# Patient Record
Sex: Female | Born: 1935 | Race: White | Hispanic: No | Smoking: Former smoker
Health system: Southern US, Community
[De-identification: ages and names within clinical notes are randomized; demographics above are authoritative.]

## PROBLEM LIST (undated history)

## (undated) DIAGNOSIS — I712 Thoracic aortic aneurysm, without rupture, unspecified: Secondary | ICD-10-CM

## (undated) DIAGNOSIS — R0989 Other specified symptoms and signs involving the circulatory and respiratory systems: Secondary | ICD-10-CM

## (undated) DIAGNOSIS — M5030 Other cervical disc degeneration, unspecified cervical region: Secondary | ICD-10-CM

## (undated) DIAGNOSIS — G629 Polyneuropathy, unspecified: Secondary | ICD-10-CM

## (undated) DIAGNOSIS — F32A Depression, unspecified: Secondary | ICD-10-CM

## (undated) DIAGNOSIS — I517 Cardiomegaly: Secondary | ICD-10-CM

## (undated) DIAGNOSIS — I480 Paroxysmal atrial fibrillation: Secondary | ICD-10-CM

## (undated) DIAGNOSIS — Z7901 Long term (current) use of anticoagulants: Secondary | ICD-10-CM

## (undated) DIAGNOSIS — F419 Anxiety disorder, unspecified: Secondary | ICD-10-CM

## (undated) DIAGNOSIS — E785 Hyperlipidemia, unspecified: Secondary | ICD-10-CM

## (undated) DIAGNOSIS — M1990 Unspecified osteoarthritis, unspecified site: Secondary | ICD-10-CM

## (undated) DIAGNOSIS — I700 Atherosclerosis of aorta: Secondary | ICD-10-CM

## (undated) DIAGNOSIS — R296 Repeated falls: Secondary | ICD-10-CM

## (undated) DIAGNOSIS — I2510 Atherosclerotic heart disease of native coronary artery without angina pectoris: Secondary | ICD-10-CM

## (undated) DIAGNOSIS — I5030 Unspecified diastolic (congestive) heart failure: Secondary | ICD-10-CM

## (undated) DIAGNOSIS — R0600 Dyspnea, unspecified: Secondary | ICD-10-CM

## (undated) DIAGNOSIS — D649 Anemia, unspecified: Secondary | ICD-10-CM

## (undated) DIAGNOSIS — C189 Malignant neoplasm of colon, unspecified: Secondary | ICD-10-CM

## (undated) DIAGNOSIS — I6789 Other cerebrovascular disease: Secondary | ICD-10-CM

## (undated) DIAGNOSIS — K219 Gastro-esophageal reflux disease without esophagitis: Secondary | ICD-10-CM

## (undated) DIAGNOSIS — I350 Nonrheumatic aortic (valve) stenosis: Secondary | ICD-10-CM

## (undated) DIAGNOSIS — I779 Disorder of arteries and arterioles, unspecified: Secondary | ICD-10-CM

## (undated) DIAGNOSIS — S32009A Unspecified fracture of unspecified lumbar vertebra, initial encounter for closed fracture: Secondary | ICD-10-CM

## (undated) DIAGNOSIS — K5790 Diverticulosis of intestine, part unspecified, without perforation or abscess without bleeding: Secondary | ICD-10-CM

## (undated) DIAGNOSIS — K4090 Unilateral inguinal hernia, without obstruction or gangrene, not specified as recurrent: Secondary | ICD-10-CM

## (undated) DIAGNOSIS — C801 Malignant (primary) neoplasm, unspecified: Secondary | ICD-10-CM

## (undated) DIAGNOSIS — I272 Pulmonary hypertension, unspecified: Secondary | ICD-10-CM

## (undated) DIAGNOSIS — I10 Essential (primary) hypertension: Secondary | ICD-10-CM

## (undated) DIAGNOSIS — I4891 Unspecified atrial fibrillation: Secondary | ICD-10-CM

## (undated) DIAGNOSIS — I209 Angina pectoris, unspecified: Secondary | ICD-10-CM

## (undated) HISTORY — PX: CATARACT EXTRACTION: SUR2

## (undated) HISTORY — PX: FEMUR FRACTURE SURGERY: SHX633

## (undated) HISTORY — PX: COLON SURGERY: SHX602

## (undated) HISTORY — PX: HIP SURGERY: SHX245

## (undated) HISTORY — PX: FOREARM SURGERY: SHX651

---

## 2004-03-12 ENCOUNTER — Emergency Department: Payer: Self-pay | Admitting: Emergency Medicine

## 2004-04-04 ENCOUNTER — Ambulatory Visit: Payer: Self-pay | Admitting: Internal Medicine

## 2005-05-19 ENCOUNTER — Ambulatory Visit: Payer: Self-pay | Admitting: Gastroenterology

## 2005-06-02 ENCOUNTER — Other Ambulatory Visit: Payer: Self-pay

## 2005-06-02 ENCOUNTER — Inpatient Hospital Stay: Payer: Self-pay | Admitting: General Surgery

## 2005-06-23 ENCOUNTER — Ambulatory Visit: Payer: Self-pay | Admitting: Oncology

## 2005-06-29 ENCOUNTER — Ambulatory Visit: Payer: Self-pay | Admitting: General Surgery

## 2005-06-30 ENCOUNTER — Ambulatory Visit: Payer: Self-pay | Admitting: General Surgery

## 2005-07-16 ENCOUNTER — Ambulatory Visit: Payer: Self-pay | Admitting: Oncology

## 2005-08-15 ENCOUNTER — Ambulatory Visit: Payer: Self-pay | Admitting: Oncology

## 2005-09-15 ENCOUNTER — Ambulatory Visit: Payer: Self-pay | Admitting: Oncology

## 2005-10-15 ENCOUNTER — Ambulatory Visit: Payer: Self-pay | Admitting: Oncology

## 2005-11-15 ENCOUNTER — Ambulatory Visit: Payer: Self-pay | Admitting: Oncology

## 2005-12-16 ENCOUNTER — Ambulatory Visit: Payer: Self-pay | Admitting: Oncology

## 2005-12-30 ENCOUNTER — Emergency Department: Payer: Self-pay | Admitting: General Practice

## 2006-01-15 ENCOUNTER — Ambulatory Visit: Payer: Self-pay | Admitting: Oncology

## 2006-02-15 ENCOUNTER — Ambulatory Visit: Payer: Self-pay | Admitting: Oncology

## 2006-03-05 ENCOUNTER — Emergency Department: Payer: Self-pay | Admitting: Unknown Physician Specialty

## 2006-03-12 ENCOUNTER — Emergency Department: Payer: Self-pay | Admitting: Emergency Medicine

## 2006-03-17 ENCOUNTER — Ambulatory Visit: Payer: Self-pay | Admitting: Oncology

## 2006-04-17 ENCOUNTER — Ambulatory Visit: Payer: Self-pay | Admitting: Oncology

## 2006-05-15 ENCOUNTER — Ambulatory Visit: Payer: Self-pay | Admitting: Gastroenterology

## 2006-05-18 ENCOUNTER — Ambulatory Visit: Payer: Self-pay | Admitting: Oncology

## 2006-06-16 ENCOUNTER — Ambulatory Visit: Payer: Self-pay | Admitting: Oncology

## 2006-07-17 ENCOUNTER — Ambulatory Visit: Payer: Self-pay | Admitting: Oncology

## 2006-08-16 ENCOUNTER — Ambulatory Visit: Payer: Self-pay | Admitting: Oncology

## 2006-08-27 ENCOUNTER — Ambulatory Visit: Payer: Self-pay | Admitting: Oncology

## 2006-09-16 ENCOUNTER — Ambulatory Visit: Payer: Self-pay | Admitting: Oncology

## 2006-10-16 ENCOUNTER — Ambulatory Visit: Payer: Self-pay | Admitting: Oncology

## 2006-11-16 ENCOUNTER — Ambulatory Visit: Payer: Self-pay | Admitting: Oncology

## 2006-12-17 ENCOUNTER — Ambulatory Visit: Payer: Self-pay | Admitting: Oncology

## 2007-01-30 ENCOUNTER — Ambulatory Visit: Payer: Self-pay | Admitting: Internal Medicine

## 2007-02-16 ENCOUNTER — Ambulatory Visit: Payer: Self-pay | Admitting: Oncology

## 2007-02-20 ENCOUNTER — Ambulatory Visit: Payer: Self-pay | Admitting: Oncology

## 2007-03-18 ENCOUNTER — Ambulatory Visit: Payer: Self-pay | Admitting: Oncology

## 2007-05-19 ENCOUNTER — Ambulatory Visit: Payer: Self-pay | Admitting: Oncology

## 2007-05-23 ENCOUNTER — Ambulatory Visit: Payer: Self-pay | Admitting: Oncology

## 2007-06-16 ENCOUNTER — Ambulatory Visit: Payer: Self-pay | Admitting: Oncology

## 2007-08-16 ENCOUNTER — Ambulatory Visit: Payer: Self-pay | Admitting: Oncology

## 2007-08-21 ENCOUNTER — Ambulatory Visit: Payer: Self-pay | Admitting: Oncology

## 2007-09-16 ENCOUNTER — Ambulatory Visit: Payer: Self-pay | Admitting: Oncology

## 2007-09-20 ENCOUNTER — Inpatient Hospital Stay: Payer: Self-pay | Admitting: Internal Medicine

## 2007-11-16 ENCOUNTER — Ambulatory Visit: Payer: Self-pay | Admitting: Oncology

## 2007-11-27 ENCOUNTER — Ambulatory Visit: Payer: Self-pay | Admitting: Oncology

## 2007-12-17 ENCOUNTER — Ambulatory Visit: Payer: Self-pay | Admitting: Oncology

## 2008-03-03 ENCOUNTER — Ambulatory Visit: Payer: Self-pay | Admitting: Internal Medicine

## 2008-05-18 ENCOUNTER — Ambulatory Visit: Payer: Self-pay | Admitting: Oncology

## 2008-05-27 ENCOUNTER — Ambulatory Visit: Payer: Self-pay | Admitting: Oncology

## 2008-06-15 ENCOUNTER — Ambulatory Visit: Payer: Self-pay | Admitting: Oncology

## 2008-07-16 ENCOUNTER — Inpatient Hospital Stay: Payer: Self-pay | Admitting: Specialist

## 2008-11-15 ENCOUNTER — Ambulatory Visit: Payer: Self-pay | Admitting: Oncology

## 2008-11-30 ENCOUNTER — Ambulatory Visit: Payer: Self-pay | Admitting: Oncology

## 2008-12-16 ENCOUNTER — Ambulatory Visit: Payer: Self-pay | Admitting: Oncology

## 2009-03-16 ENCOUNTER — Ambulatory Visit: Payer: Self-pay | Admitting: Internal Medicine

## 2009-05-18 ENCOUNTER — Ambulatory Visit: Payer: Self-pay | Admitting: Oncology

## 2009-06-08 ENCOUNTER — Ambulatory Visit: Payer: Self-pay | Admitting: Oncology

## 2009-06-15 ENCOUNTER — Ambulatory Visit: Payer: Self-pay | Admitting: Oncology

## 2009-06-22 ENCOUNTER — Ambulatory Visit: Payer: Self-pay | Admitting: Gastroenterology

## 2009-11-15 ENCOUNTER — Ambulatory Visit: Payer: Self-pay | Admitting: Oncology

## 2009-11-18 ENCOUNTER — Ambulatory Visit: Payer: Self-pay | Admitting: Oncology

## 2009-11-23 ENCOUNTER — Ambulatory Visit: Payer: Self-pay | Admitting: Oncology

## 2009-12-04 ENCOUNTER — Ambulatory Visit: Payer: Self-pay | Admitting: Internal Medicine

## 2009-12-16 ENCOUNTER — Ambulatory Visit: Payer: Self-pay | Admitting: Oncology

## 2010-03-23 ENCOUNTER — Ambulatory Visit: Payer: Self-pay | Admitting: Internal Medicine

## 2010-05-16 ENCOUNTER — Ambulatory Visit: Payer: Self-pay | Admitting: Gastroenterology

## 2010-06-01 ENCOUNTER — Ambulatory Visit: Payer: Self-pay | Admitting: Oncology

## 2010-06-02 LAB — CEA: CEA: 1.3 ng/mL (ref 0.0–4.7)

## 2010-06-16 ENCOUNTER — Ambulatory Visit: Payer: Self-pay | Admitting: Oncology

## 2010-11-28 ENCOUNTER — Ambulatory Visit: Payer: Self-pay | Admitting: Oncology

## 2010-11-30 ENCOUNTER — Ambulatory Visit: Payer: Self-pay | Admitting: Oncology

## 2010-12-01 LAB — CEA: CEA: 1.7 ng/mL (ref 0.0–4.7)

## 2010-12-06 ENCOUNTER — Ambulatory Visit: Payer: Self-pay | Admitting: Specialist

## 2010-12-08 ENCOUNTER — Ambulatory Visit: Payer: Self-pay | Admitting: Specialist

## 2010-12-17 ENCOUNTER — Ambulatory Visit: Payer: Self-pay | Admitting: Oncology

## 2011-05-17 ENCOUNTER — Ambulatory Visit: Payer: Self-pay | Admitting: Internal Medicine

## 2011-09-01 ENCOUNTER — Ambulatory Visit: Payer: Self-pay | Admitting: Internal Medicine

## 2012-05-17 ENCOUNTER — Ambulatory Visit: Payer: Self-pay | Admitting: Internal Medicine

## 2012-08-05 ENCOUNTER — Ambulatory Visit: Payer: Self-pay | Admitting: Specialist

## 2012-08-15 ENCOUNTER — Ambulatory Visit: Payer: Self-pay | Admitting: Specialist

## 2013-02-24 ENCOUNTER — Ambulatory Visit: Payer: Self-pay | Admitting: Specialist

## 2013-03-05 ENCOUNTER — Ambulatory Visit: Payer: Self-pay | Admitting: Specialist

## 2013-05-19 ENCOUNTER — Ambulatory Visit: Payer: Self-pay | Admitting: Internal Medicine

## 2014-01-23 ENCOUNTER — Ambulatory Visit: Payer: Self-pay | Admitting: Internal Medicine

## 2014-01-26 ENCOUNTER — Ambulatory Visit: Payer: Self-pay | Admitting: Orthopedic Surgery

## 2014-01-28 ENCOUNTER — Ambulatory Visit: Payer: Self-pay | Admitting: Orthopedic Surgery

## 2014-01-28 LAB — CBC WITH DIFFERENTIAL/PLATELET
Basophil #: 0 10*3/uL (ref 0.0–0.1)
Basophil %: 0.4 %
EOS ABS: 0.2 10*3/uL (ref 0.0–0.7)
Eosinophil %: 3.1 %
HCT: 39.4 % (ref 35.0–47.0)
HGB: 12.5 g/dL (ref 12.0–16.0)
Lymphocyte #: 2 10*3/uL (ref 1.0–3.6)
Lymphocyte %: 26.9 %
MCH: 29.1 pg (ref 26.0–34.0)
MCHC: 31.8 g/dL — AB (ref 32.0–36.0)
MCV: 92 fL (ref 80–100)
Monocyte #: 0.5 x10 3/mm (ref 0.2–0.9)
Monocyte %: 7.4 %
NEUTROS ABS: 4.5 10*3/uL (ref 1.4–6.5)
NEUTROS PCT: 62.2 %
PLATELETS: 228 10*3/uL (ref 150–440)
RBC: 4.31 10*6/uL (ref 3.80–5.20)
RDW: 12.7 % (ref 11.5–14.5)
WBC: 7.3 10*3/uL (ref 3.6–11.0)

## 2014-01-29 ENCOUNTER — Ambulatory Visit: Payer: Self-pay | Admitting: Orthopedic Surgery

## 2014-01-31 LAB — PATHOLOGY REPORT

## 2014-05-19 DIAGNOSIS — H4011X1 Primary open-angle glaucoma, mild stage: Secondary | ICD-10-CM | POA: Diagnosis not present

## 2014-05-21 DIAGNOSIS — H4011X1 Primary open-angle glaucoma, mild stage: Secondary | ICD-10-CM | POA: Diagnosis not present

## 2014-06-02 ENCOUNTER — Ambulatory Visit: Payer: Self-pay | Admitting: Internal Medicine

## 2014-06-02 DIAGNOSIS — H538 Other visual disturbances: Secondary | ICD-10-CM | POA: Diagnosis not present

## 2014-06-02 DIAGNOSIS — I6529 Occlusion and stenosis of unspecified carotid artery: Secondary | ICD-10-CM | POA: Diagnosis not present

## 2014-06-02 DIAGNOSIS — I6523 Occlusion and stenosis of bilateral carotid arteries: Secondary | ICD-10-CM | POA: Diagnosis not present

## 2014-06-02 DIAGNOSIS — R110 Nausea: Secondary | ICD-10-CM | POA: Diagnosis not present

## 2014-06-02 DIAGNOSIS — R51 Headache: Secondary | ICD-10-CM | POA: Diagnosis not present

## 2014-06-02 DIAGNOSIS — G451 Carotid artery syndrome (hemispheric): Secondary | ICD-10-CM | POA: Diagnosis not present

## 2014-06-02 DIAGNOSIS — G459 Transient cerebral ischemic attack, unspecified: Secondary | ICD-10-CM | POA: Diagnosis not present

## 2014-06-02 DIAGNOSIS — I6521 Occlusion and stenosis of right carotid artery: Secondary | ICD-10-CM | POA: Diagnosis not present

## 2014-06-02 DIAGNOSIS — E782 Mixed hyperlipidemia: Secondary | ICD-10-CM | POA: Diagnosis not present

## 2014-06-02 DIAGNOSIS — R410 Disorientation, unspecified: Secondary | ICD-10-CM | POA: Diagnosis not present

## 2014-06-11 DIAGNOSIS — I6521 Occlusion and stenosis of right carotid artery: Secondary | ICD-10-CM | POA: Diagnosis not present

## 2014-06-16 DIAGNOSIS — Z0000 Encounter for general adult medical examination without abnormal findings: Secondary | ICD-10-CM | POA: Diagnosis not present

## 2014-06-16 DIAGNOSIS — E782 Mixed hyperlipidemia: Secondary | ICD-10-CM | POA: Diagnosis not present

## 2014-06-16 DIAGNOSIS — G620 Drug-induced polyneuropathy: Secondary | ICD-10-CM | POA: Diagnosis not present

## 2014-06-16 DIAGNOSIS — M4856XA Collapsed vertebra, not elsewhere classified, lumbar region, initial encounter for fracture: Secondary | ICD-10-CM | POA: Diagnosis not present

## 2014-07-26 ENCOUNTER — Inpatient Hospital Stay
Admit: 2014-07-26 | Disposition: A | Discharge status: Skilled Nursing Facility | Payer: Self-pay | Attending: Internal Medicine | Admitting: Internal Medicine

## 2014-07-26 DIAGNOSIS — Z85038 Personal history of other malignant neoplasm of large intestine: Secondary | ICD-10-CM | POA: Diagnosis not present

## 2014-07-26 DIAGNOSIS — S72141A Displaced intertrochanteric fracture of right femur, initial encounter for closed fracture: Secondary | ICD-10-CM | POA: Diagnosis not present

## 2014-07-26 DIAGNOSIS — M25551 Pain in right hip: Secondary | ICD-10-CM | POA: Diagnosis not present

## 2014-07-26 DIAGNOSIS — M810 Age-related osteoporosis without current pathological fracture: Secondary | ICD-10-CM | POA: Diagnosis not present

## 2014-07-26 DIAGNOSIS — D6959 Other secondary thrombocytopenia: Secondary | ICD-10-CM | POA: Diagnosis not present

## 2014-07-26 DIAGNOSIS — R1310 Dysphagia, unspecified: Secondary | ICD-10-CM | POA: Diagnosis not present

## 2014-07-26 DIAGNOSIS — K449 Diaphragmatic hernia without obstruction or gangrene: Secondary | ICD-10-CM | POA: Diagnosis not present

## 2014-07-26 DIAGNOSIS — D649 Anemia, unspecified: Secondary | ICD-10-CM | POA: Diagnosis not present

## 2014-07-26 DIAGNOSIS — S299XXA Unspecified injury of thorax, initial encounter: Secondary | ICD-10-CM | POA: Diagnosis not present

## 2014-07-26 DIAGNOSIS — E871 Hypo-osmolality and hyponatremia: Secondary | ICD-10-CM | POA: Diagnosis not present

## 2014-07-26 DIAGNOSIS — K5900 Constipation, unspecified: Secondary | ICD-10-CM | POA: Diagnosis not present

## 2014-07-26 DIAGNOSIS — E789 Disorder of lipoprotein metabolism, unspecified: Secondary | ICD-10-CM | POA: Diagnosis not present

## 2014-07-26 DIAGNOSIS — I350 Nonrheumatic aortic (valve) stenosis: Secondary | ICD-10-CM | POA: Diagnosis not present

## 2014-07-26 DIAGNOSIS — M79671 Pain in right foot: Secondary | ICD-10-CM | POA: Diagnosis not present

## 2014-07-26 DIAGNOSIS — W19XXXA Unspecified fall, initial encounter: Secondary | ICD-10-CM | POA: Diagnosis not present

## 2014-07-26 DIAGNOSIS — M25559 Pain in unspecified hip: Secondary | ICD-10-CM | POA: Diagnosis not present

## 2014-07-26 DIAGNOSIS — G629 Polyneuropathy, unspecified: Secondary | ICD-10-CM | POA: Diagnosis not present

## 2014-07-26 DIAGNOSIS — D7582 Heparin induced thrombocytopenia (HIT): Secondary | ICD-10-CM | POA: Diagnosis not present

## 2014-07-26 DIAGNOSIS — M79604 Pain in right leg: Secondary | ICD-10-CM | POA: Diagnosis not present

## 2014-07-26 DIAGNOSIS — S72144D Nondisplaced intertrochanteric fracture of right femur, subsequent encounter for closed fracture with routine healing: Secondary | ICD-10-CM | POA: Diagnosis not present

## 2014-07-26 DIAGNOSIS — E870 Hyperosmolality and hypernatremia: Secondary | ICD-10-CM | POA: Diagnosis not present

## 2014-07-26 DIAGNOSIS — S72144A Nondisplaced intertrochanteric fracture of right femur, initial encounter for closed fracture: Secondary | ICD-10-CM | POA: Diagnosis not present

## 2014-07-26 DIAGNOSIS — D696 Thrombocytopenia, unspecified: Secondary | ICD-10-CM | POA: Diagnosis not present

## 2014-07-26 DIAGNOSIS — G500 Trigeminal neuralgia: Secondary | ICD-10-CM | POA: Diagnosis not present

## 2014-07-26 DIAGNOSIS — I209 Angina pectoris, unspecified: Secondary | ICD-10-CM | POA: Diagnosis not present

## 2014-07-26 DIAGNOSIS — S72001A Fracture of unspecified part of neck of right femur, initial encounter for closed fracture: Secondary | ICD-10-CM | POA: Diagnosis not present

## 2014-07-26 DIAGNOSIS — D62 Acute posthemorrhagic anemia: Secondary | ICD-10-CM | POA: Diagnosis not present

## 2014-07-26 DIAGNOSIS — H409 Unspecified glaucoma: Secondary | ICD-10-CM | POA: Diagnosis not present

## 2014-07-26 DIAGNOSIS — Z9181 History of falling: Secondary | ICD-10-CM | POA: Diagnosis not present

## 2014-07-26 DIAGNOSIS — S22080A Wedge compression fracture of T11-T12 vertebra, initial encounter for closed fracture: Secondary | ICD-10-CM | POA: Diagnosis not present

## 2014-07-26 DIAGNOSIS — S72141D Displaced intertrochanteric fracture of right femur, subsequent encounter for closed fracture with routine healing: Secondary | ICD-10-CM | POA: Diagnosis not present

## 2014-07-26 DIAGNOSIS — E785 Hyperlipidemia, unspecified: Secondary | ICD-10-CM | POA: Diagnosis not present

## 2014-07-26 DIAGNOSIS — Z01818 Encounter for other preprocedural examination: Secondary | ICD-10-CM | POA: Diagnosis not present

## 2014-07-26 LAB — CBC WITH DIFFERENTIAL/PLATELET
BASOS PCT: 0.2 %
Basophil #: 0 10*3/uL (ref 0.0–0.1)
EOS ABS: 0.2 10*3/uL (ref 0.0–0.7)
Eosinophil %: 2.4 %
HCT: 39.5 % (ref 35.0–47.0)
HGB: 13 g/dL (ref 12.0–16.0)
LYMPHS ABS: 1.6 10*3/uL (ref 1.0–3.6)
Lymphocyte %: 24.2 %
MCH: 30.2 pg (ref 26.0–34.0)
MCHC: 32.9 g/dL (ref 32.0–36.0)
MCV: 92 fL (ref 80–100)
MONO ABS: 0.5 x10 3/mm (ref 0.2–0.9)
Monocyte %: 7.8 %
Neutrophil #: 4.4 10*3/uL (ref 1.4–6.5)
Neutrophil %: 65.4 %
PLATELETS: 221 10*3/uL (ref 150–440)
RBC: 4.31 10*6/uL (ref 3.80–5.20)
RDW: 12.4 % (ref 11.5–14.5)
WBC: 6.8 10*3/uL (ref 3.6–11.0)

## 2014-07-26 LAB — COMPREHENSIVE METABOLIC PANEL
ALT: 13 U/L — AB
Albumin: 3.8 g/dL
Alkaline Phosphatase: 60 U/L
Anion Gap: 7 (ref 7–16)
BUN: 9 mg/dL
Bilirubin,Total: 0.6 mg/dL
CO2: 28 mmol/L
CREATININE: 0.46 mg/dL
Calcium, Total: 8.3 mg/dL — ABNORMAL LOW
Chloride: 92 mmol/L — ABNORMAL LOW
EGFR (African American): >60
EGFR (Non-African Amer.): >60
Glucose: 104 mg/dL — ABNORMAL HIGH
POTASSIUM: 4.1 mmol/L
SGOT(AST): 18 U/L
SODIUM: 127 mmol/L — AB
Total Protein: 6.4 g/dL — ABNORMAL LOW

## 2014-07-26 LAB — PROTIME-INR
INR: 0.9
PROTHROMBIN TIME: 12.7 s

## 2014-07-27 LAB — CBC WITH DIFFERENTIAL/PLATELET
BASOS PCT: 0.2 %
Basophil #: 0 10*3/uL (ref 0.0–0.1)
EOS PCT: 1.1 %
Eosinophil #: 0.1 10*3/uL (ref 0.0–0.7)
HCT: 33.6 % — ABNORMAL LOW (ref 35.0–47.0)
HGB: 10.9 g/dL — ABNORMAL LOW (ref 12.0–16.0)
LYMPHS ABS: 1.3 10*3/uL (ref 1.0–3.6)
Lymphocyte %: 14.4 %
MCH: 30.3 pg (ref 26.0–34.0)
MCHC: 32.6 g/dL (ref 32.0–36.0)
MCV: 93 fL (ref 80–100)
MONO ABS: 0.9 x10 3/mm (ref 0.2–0.9)
MONOS PCT: 9.7 %
Neutrophil #: 6.8 10*3/uL — ABNORMAL HIGH (ref 1.4–6.5)
Neutrophil %: 74.6 %
Platelet: 161 10*3/uL (ref 150–440)
RBC: 3.61 10*6/uL — AB (ref 3.80–5.20)
RDW: 12.4 % (ref 11.5–14.5)
WBC: 9.1 10*3/uL (ref 3.6–11.0)

## 2014-07-27 LAB — BASIC METABOLIC PANEL
ANION GAP: 6 — AB (ref 7–16)
BUN: 11 mg/dL
CALCIUM: 8.2 mg/dL — AB
CHLORIDE: 100 mmol/L — AB
Co2: 25 mmol/L
Creatinine: 0.41 mg/dL — ABNORMAL LOW
EGFR (Non-African Amer.): >60
Glucose: 120 mg/dL — ABNORMAL HIGH
POTASSIUM: 3.9 mmol/L
Sodium: 131 mmol/L — ABNORMAL LOW

## 2014-07-28 LAB — CBC WITH DIFFERENTIAL/PLATELET
BASOS PCT: 0.1 %
Basophil #: 0 10*3/uL (ref 0.0–0.1)
EOS ABS: 0.3 10*3/uL (ref 0.0–0.7)
EOS PCT: 3.9 %
HCT: 22.9 % — AB (ref 35.0–47.0)
HGB: 7.4 g/dL — AB (ref 12.0–16.0)
LYMPHS PCT: 17.6 %
Lymphocyte #: 1.5 10*3/uL (ref 1.0–3.6)
MCH: 29.6 pg (ref 26.0–34.0)
MCHC: 32.2 g/dL (ref 32.0–36.0)
MCV: 92 fL (ref 80–100)
Monocyte #: 1 x10 3/mm — ABNORMAL HIGH (ref 0.2–0.9)
Monocyte %: 11.6 %
NEUTROS ABS: 5.7 10*3/uL (ref 1.4–6.5)
Neutrophil %: 66.8 %
Platelet: 122 10*3/uL — ABNORMAL LOW (ref 150–440)
RBC: 2.49 10*6/uL — AB (ref 3.80–5.20)
RDW: 12.5 % (ref 11.5–14.5)
WBC: 8.5 10*3/uL (ref 3.6–11.0)

## 2014-07-28 LAB — BASIC METABOLIC PANEL
ANION GAP: 4 — AB (ref 7–16)
BUN: 13 mg/dL
CALCIUM: 7.7 mg/dL — AB
Chloride: 98 mmol/L — ABNORMAL LOW
Co2: 28 mmol/L
Creatinine: 0.65 mg/dL
EGFR (Non-African Amer.): >60
Glucose: 148 mg/dL — ABNORMAL HIGH
POTASSIUM: 3.5 mmol/L
SODIUM: 130 mmol/L — AB

## 2014-07-29 LAB — BASIC METABOLIC PANEL
ANION GAP: 4 — AB (ref 7–16)
BUN: 13 mg/dL
CALCIUM: 7.9 mg/dL — AB
CHLORIDE: 100 mmol/L — AB
Co2: 28 mmol/L
Creatinine: 0.44 mg/dL
Glucose: 134 mg/dL — ABNORMAL HIGH
Potassium: 3.5 mmol/L
Sodium: 132 mmol/L — ABNORMAL LOW

## 2014-07-29 LAB — CBC WITH DIFFERENTIAL/PLATELET
Basophil #: 0 10*3/uL (ref 0.0–0.1)
Basophil %: 0.3 %
EOS ABS: 0.2 10*3/uL (ref 0.0–0.7)
Eosinophil %: 3.1 %
HCT: 21.6 % — ABNORMAL LOW (ref 35.0–47.0)
Lymphocyte #: 1 10*3/uL (ref 1.0–3.6)
Lymphocyte %: 12.8 %
MCH: 30.1 pg (ref 26.0–34.0)
MCHC: 33.4 g/dL (ref 32.0–36.0)
MCV: 90 fL (ref 80–100)
MONO ABS: 1 x10 3/mm — AB (ref 0.2–0.9)
MONOS PCT: 11.9 %
Neutrophil #: 5.9 10*3/uL (ref 1.4–6.5)
Neutrophil %: 71.9 %
PLATELETS: 107 10*3/uL — AB (ref 150–440)
RBC: 2.4 10*6/uL — ABNORMAL LOW (ref 3.80–5.20)
RDW: 13.3 % (ref 11.5–14.5)
WBC: 8.1 10*3/uL (ref 3.6–11.0)

## 2014-07-29 LAB — HEMOGLOBIN
HGB: 7.2 g/dL — ABNORMAL LOW (ref 12.0–16.0)
HGB: 9.6 g/dL — AB (ref 12.0–16.0)

## 2014-07-30 DIAGNOSIS — R1310 Dysphagia, unspecified: Secondary | ICD-10-CM | POA: Diagnosis not present

## 2014-07-30 DIAGNOSIS — G500 Trigeminal neuralgia: Secondary | ICD-10-CM | POA: Diagnosis not present

## 2014-07-30 DIAGNOSIS — M810 Age-related osteoporosis without current pathological fracture: Secondary | ICD-10-CM | POA: Diagnosis not present

## 2014-07-30 DIAGNOSIS — S72144A Nondisplaced intertrochanteric fracture of right femur, initial encounter for closed fracture: Secondary | ICD-10-CM | POA: Diagnosis not present

## 2014-07-30 DIAGNOSIS — M25559 Pain in unspecified hip: Secondary | ICD-10-CM | POA: Diagnosis not present

## 2014-07-30 DIAGNOSIS — K449 Diaphragmatic hernia without obstruction or gangrene: Secondary | ICD-10-CM | POA: Diagnosis not present

## 2014-07-30 DIAGNOSIS — E789 Disorder of lipoprotein metabolism, unspecified: Secondary | ICD-10-CM | POA: Diagnosis not present

## 2014-07-30 DIAGNOSIS — S72141D Displaced intertrochanteric fracture of right femur, subsequent encounter for closed fracture with routine healing: Secondary | ICD-10-CM | POA: Diagnosis not present

## 2014-07-30 DIAGNOSIS — D649 Anemia, unspecified: Secondary | ICD-10-CM | POA: Diagnosis not present

## 2014-07-30 DIAGNOSIS — G629 Polyneuropathy, unspecified: Secondary | ICD-10-CM | POA: Diagnosis not present

## 2014-07-30 DIAGNOSIS — D62 Acute posthemorrhagic anemia: Secondary | ICD-10-CM | POA: Diagnosis not present

## 2014-07-30 DIAGNOSIS — D7582 Heparin induced thrombocytopenia (HIT): Secondary | ICD-10-CM | POA: Diagnosis not present

## 2014-07-30 DIAGNOSIS — Z85038 Personal history of other malignant neoplasm of large intestine: Secondary | ICD-10-CM | POA: Diagnosis not present

## 2014-07-30 DIAGNOSIS — I209 Angina pectoris, unspecified: Secondary | ICD-10-CM | POA: Diagnosis not present

## 2014-07-30 DIAGNOSIS — K5900 Constipation, unspecified: Secondary | ICD-10-CM | POA: Diagnosis not present

## 2014-07-30 DIAGNOSIS — E785 Hyperlipidemia, unspecified: Secondary | ICD-10-CM | POA: Diagnosis not present

## 2014-07-30 DIAGNOSIS — H409 Unspecified glaucoma: Secondary | ICD-10-CM | POA: Diagnosis not present

## 2014-07-30 DIAGNOSIS — Z9181 History of falling: Secondary | ICD-10-CM | POA: Diagnosis not present

## 2014-07-30 DIAGNOSIS — I350 Nonrheumatic aortic (valve) stenosis: Secondary | ICD-10-CM | POA: Diagnosis not present

## 2014-07-30 DIAGNOSIS — S72144D Nondisplaced intertrochanteric fracture of right femur, subsequent encounter for closed fracture with routine healing: Secondary | ICD-10-CM | POA: Diagnosis not present

## 2014-07-30 LAB — CBC WITH DIFFERENTIAL/PLATELET
BASOS ABS: 0 10*3/uL (ref 0.0–0.1)
Basophil %: 0.4 %
EOS PCT: 3.1 %
Eosinophil #: 0.3 10*3/uL (ref 0.0–0.7)
HCT: 26.6 % — ABNORMAL LOW (ref 35.0–47.0)
HGB: 8.9 g/dL — ABNORMAL LOW (ref 12.0–16.0)
LYMPHS ABS: 1.7 10*3/uL (ref 1.0–3.6)
Lymphocyte %: 19.9 %
MCH: 30.3 pg (ref 26.0–34.0)
MCHC: 33.4 g/dL (ref 32.0–36.0)
MCV: 91 fL (ref 80–100)
Monocyte #: 0.9 x10 3/mm (ref 0.2–0.9)
Monocyte %: 10.7 %
NEUTROS ABS: 5.7 10*3/uL (ref 1.4–6.5)
Neutrophil %: 65.9 %
PLATELETS: 124 10*3/uL — AB (ref 150–440)
RBC: 2.94 10*6/uL — ABNORMAL LOW (ref 3.80–5.20)
RDW: 13.5 % (ref 11.5–14.5)
WBC: 8.7 10*3/uL (ref 3.6–11.0)

## 2014-07-30 LAB — BASIC METABOLIC PANEL
ANION GAP: 6 — AB (ref 7–16)
BUN: 10 mg/dL
CALCIUM: 8 mg/dL — AB
CREATININE: 0.33 mg/dL — AB
Chloride: 99 mmol/L — ABNORMAL LOW
Co2: 29 mmol/L
Glucose: 115 mg/dL — ABNORMAL HIGH
POTASSIUM: 3.7 mmol/L
SODIUM: 134 mmol/L — AB

## 2014-08-07 NOTE — Op Note (Signed)
PATIENT NAME:  Caroline Aguilar, Caroline Aguilar MR#:  [id number] DATE OF BIRTH:  Nov 04, 1925  DATE OF PROCEDURE:  03/05/2013  PREOPERATIVE DIAGNOSES: 1. Severe right thumb carpometacarpal arthritis. 2.  Tenosynovitis first and second dorsal compartments of the wrist and thumb.   OPERATION:  1. Right thumb CMC arthroplasty using palmaris longus tendon graft. 2. Tenosynovectomy first and second dorsal compartments.   SURGEON: Park Breed, M.D.   ANESTHESIA: General LMA.   COMPLICATIONS: None.   DRAINS: None.   ESTIMATED BLOOD LOSS: None.   REPLACED: None.   DESCRIPTION OF PROCEDURE: The patient was brought to the Operating Room where she underwent satisfactory general LMA anesthesia in the supine position. The right arm was prepped and draped in sterile fashion. An Esmarch was applied. Three small transverse incisions were made over the course of the palmaris longus tendon and the tendon was freed up from adhesions, tagged distally and released, and then removed in its entirety with a tendon stripper. These wounds were closed with 5-0 nylon suture. Another incision was then made longitudinally starting volarly over the distal radius, crossing transversely at the base of the metacarpal and extending distally at the dorsum of the first metacarpal. Dissection was carried out bluntly through subcutaneous tissue. Small vessels and nerves were protected and retracted. The capsule over the trapezium was opened sharply and the radial artery and veins freed up from adhesions and a vessel loop drain placed around them for retraction protection. There was extensive synovitis fluid found in the first and second dorsal compartments. These were opened and released completely and tenosynovium removed. The first dorsal compartment was released in its entirety proximal to the radial styloid to prevent de Quervain's symptoms later.   The thumb metacarpal was dorsally subluxed. A portion of this was resected. The capsule  was carefully peeled off of the trapezium and metacarpal for later repair. The trapezium was then cut with an oscillating saw and removed piecemeal in its entirety. The flexor carpi radialis tendon was visible in the depths of the wound. The wound was irrigated. Fluoroscopy showed excellent removal of the trapezium bone. A 3.2 drill hole was made in the base of the metacarpal. The tendon graft was passed beneath the flexor carpi radialis tendon and brought up through the hole and the metacarpal. It was tied on itself and sutured. The tendon and muscle were then tied into a ball and sutured with  3-0 Vicryl suture to maintain shape. This was then rotated down into the space between the metacarpal and navicular. The capsule was then closed tightly with 3-0 Vicryl suture. Fluoroscopy showed excellent postoperative position of the metacarpal. The skin was closed with 5-0 nylon sutures and 0.5% Marcaine was placed in all wounds. A dry sterile compression hand dressing and thumb spica splint were applied. The tourniquet was deflated with good return of blood flow to the hand. The patient was awakened and taken to recovery in good condition. ____________________________ Park Breed, MD hem:sg D: 03/05/2013 09:38:20 ET T: 03/05/2013 10:06:45 ET JOB#: [id number]  cc: Park Breed, MD, <Dictator> Park Breed MD ELECTRONICALLY SIGNED 03/05/2013 11:06

## 2014-08-07 NOTE — Op Note (Signed)
PATIENT NAME:  Caroline Aguilar, Caroline Aguilar MR#:  [id number] DATE OF BIRTH:  May 26, 1925  DATE OF PROCEDURE:  08/15/2012  PREOPERATIVE DIAGNOSIS: Right carpal tunnel syndrome.   POSTOPERATIVE DIAGNOSIS: Right carpal tunnel syndrome.   OPERATION: Right carpal tunnel release.   SURGEON: Park Breed, M.D.   ANESTHESIA: General LMA.   COMPLICATIONS: None.   DRAINS: None.   ESTIMATED BLOOD LOSS: None.   REPLACEMENTS: None.   DESCRIPTION OF PROCEDURE: The patient was brought to the operating room where she underwent satisfactory LMA anesthesia in the supine position. The right arm was prepped and draped in sterile fashion. Esmarch was applied and the tourniquet inflated to 250 mmHg. Tourniquet time was 18 minutes. A longitudinal incision was made in the base of the palm just to the ulnar side of midline. Dissection was carried out bluntly through subcutaneous tissue exposing the distal aspect of the volar carpal ligament. A Kelly clamp was passed beneath the ligament to protect the nerve and the volar carpal ligament was released with a mini blade knife distally and with carpal tunnel scissors proximally under direct vision. The median nerve was pale and flattened. It branched early. Motor branch came off more volar than usual and was seen to be intact. The ulnar nerve and artery were freed up in Guyon's  canal as well. Adhesions were released using a mosquito clamp. The wound was then irrigated and closed with running 5-0 nylon suture. Marcaine 0.5% was placed in the wound and a dry sterile compression hand dressing applied with volar splint applied. Tourniquet was deflated with good return of blood flow to the hand. The patient was awakened and taken to recovery in good condition.  ____________________________ Park Breed, MD hem:aw D: 08/15/2012 08:44:21 ET T: 08/15/2012 09:25:53 ET JOB#: [id number]  cc: Park Breed, MD, <Dictator> Park Breed MD ELECTRONICALLY SIGNED 08/15/2012 9:47

## 2014-08-08 NOTE — Op Note (Signed)
PATIENT NAME:  Caroline Aguilar, Caroline Aguilar MR#:  [id number] DATE OF BIRTH:  1925-09-16  DATE OF PROCEDURE:  01/29/2014  PREOPERATIVE DIAGNOSIS: Acute L2 compression fracture.  POSTOPERATIVE DIAGNOSIS: Acute L2 compression fracture.   PROCEDURE: L2 kyphoplasty with biopsy.   SURGEON: Hessie Knows, M.D.   ANESTHESIA: MAC.   DESCRIPTION OF PROCEDURE: The patient was brought to the operating room and after adequate sedation was given, the patient was placed prone. C-arm was brought in and excellent visualization of the L2 vertebral body was obtained in both AP and lateral projections. After appropriate patient identification and timeout procedures were completed, 10 mL of 1% Xylocaine was infiltrated subcutaneously on the left side. The back was then prepped and draped in the usual sterile fashion and repeat timeout procedure completed. A spinal needle was used to get local down to the pedicle with a 50-50 mix of 20 mL of 1% Xylocaine and 0.5% Sensorcaine with epinephrine. After allowing this to set, a small stab incision was made and a trocar advanced to the pedicle, advanced through the pedicle into the vertebral body and biopsy obtained. Care was taken during advancing the trocar that the spinal canal and neural foramen were not entered. A biopsy was obtained of the bone and had normal appearance. Drilling was carried out followed by placement of a balloon with approximately 4 mL of inflation. Following this the bone was removed and the bone was of the appropriate consistency. Approximately 3 mL of bone cement filled the vertebral body getting good fill and interdigitation with a small amount going up into the concave area of the L1-L2 disk space. The trocar was removed and permanent C-arm views obtained with good fill of the L2 vertebral body. The wound was closed with Dermabond followed by a Band-Aid. The patient was sent to the recovery room in stable condition.   ESTIMATED BLOOD LOSS: Minimal.    COMPLICATIONS: None.   SPECIMEN: L2 vertebral body biopsy.   ____________________________ Laurene Footman, MD mjm:sb D: 01/29/2014 16:42:16 ET T: 01/29/2014 17:05:23 ET JOB#: [id number]  cc: Laurene Footman, MD, <Dictator> Laurene Footman MD ELECTRONICALLY SIGNED 01/29/2014 22:21

## 2014-08-16 NOTE — Consult Note (Signed)
Brief Consult Note: Diagnosis: Right intertrochanteric hip fracture.   Patient was seen by consultant.   Recommend to proceed with surgery or procedure.   Recommend further assessment or treatment.   Orders entered.   Discussed with Attending MD.   Comments: 81 year old female fell coming out of church today injuring the right hip.  Brought to Emergency Room where exam and X-rays show a comminuted right intertrochanteric hip fracture.  Admitted for medical evaluation and surgery. I have repaired her left hip and elbow in the past.  She and her son wish to proceed with surgery when stabilized.  Took Plavix until last night.  Also sodium is 127 and she will get NS for this.  Plan surgery tomorrow.  Risks and benefits of surgery were discussed at length including but not limited to infection, non union, nerve or blood vessed damage, non union, need for repeat surgery, blood clots and lung emboli, and death.   Exam:  Alert and lying quietly in bed.  circulation/sensation/motor function good right leg.  Leg short and externally rotated.  Skin intact. No other injuries noted.  Pain with range of motion right leg.    X-rays: as above  Plan:  open reduction and internal fixation right hip with Trochanteric Fixation Nail tomorrow.  Electronic Signatures: Park Breed (MD)  (Signed 10-Apr-16 16:56)  Authored: Brief Consult Note   Last Updated: 10-Apr-16 16:56 by Park Breed (MD)

## 2014-08-16 NOTE — Consult Note (Signed)
Chief Complaint:  Subjective/Chief Complaint Pt reports persistent dysphagia.  Awaiting Ba Study.  Had a "spell last night"  where she felt things just hang in the back of her throat.   VITAL SIGNS/ANCILLARY NOTES: **Vital Signs.:   14-Apr-16 09:27  Vital Signs Type Routine  Temperature Temperature (F) 98.7  Temperature Source oral  Pulse Pulse 82  Respirations Respirations 18  Systolic BP Systolic BP 161  Diastolic BP (mmHg) Diastolic BP (mmHg) 64  Mean BP 80  Pulse Ox % Pulse Ox % 94  Pulse Ox Activity Level  At rest  Oxygen Delivery 2L   Brief Assessment:  GEN well developed, well nourished, no acute distress, A/Ox3, Daughter-in-law at bedside   Cardiac Regular   Respiratory normal resp effort   Gastrointestinal Normal   Gastrointestinal details normal Soft  Nontender  Nondistended   EXTR negative cyanosis/clubbing, negative edema   Additional Physical Exam Skin: pink, warm, dry   Lab Results: Routine Chem:  14-Apr-16 05:32   Glucose, Serum  115 (65-99 NOTE: New Reference Range  06/23/14)  BUN 10 (6-20 NOTE: New Reference Range  06/23/14)  Creatinine (comp)  0.33 (0.44-1.00 NOTE: New Reference Range  06/23/14)  Potassium, Serum 3.7 (3.5-5.1 NOTE: New Reference Range  06/23/14)  CO2, Serum 29 (22-32 NOTE: New Reference Range  06/23/14)  Calcium (Total), Serum  8.0 (8.9-10.3 NOTE: New Reference Range  06/23/14)  Anion Gap  6  eGFR (African American) >60  eGFR (Non-African American) >60 (eGFR values <17m/min/1.73 m2 may be an indication of chronic kidney disease (CKD). Calculated eGFR is useful in patients with stable renal function. The eGFR calculation will not be reliable in acutely ill patients when serum creatinine is changing rapidly. It is not useful in patients on dialysis. The eGFR calculation may not be applicable to patients at the low and high extremes of body sizes, pregnant women, and vegetarians.)  Routine Hem:  14-Apr-16 05:32   WBC  (CBC) 8.7  RBC (CBC)  2.94  Hemoglobin (CBC)  8.9  Hematocrit (CBC)  26.6  Platelet Count (CBC)  124  MCV 91  MCH 30.3  MCHC 33.4  RDW 13.5  Neutrophil % 65.9  Lymphocyte % 19.9  Monocyte % 10.7  Eosinophil % 3.1  Basophil % 0.4  Neutrophil # 5.7  Lymphocyte # 1.7  Monocyte # 0.9  Eosinophil # 0.3  Basophil # 0.0 (Result(s) reported on 30 Jul 2014 at 06:42AM.)   Assessment/Plan:  Assessment/Plan:  Assessment Acute on chronic dysphagia:  Ba swallow is pending today.  Pt likely to be discharged today.  10 years of dysphagia with dilation and EGD many years ago. Outpatient EGD after Rehab.   Plan 1) Followup Ba swallow 2) Will set up outpatient followup & EGD post rehab if esophageal web, ring or stricture on Ba study Please call if you have any questions or concerns   Electronic Signatures: JAndria Meuse(NP)  (Signed 14-Apr-16 09:40)  Authored: Chief Complaint, VITAL SIGNS/ANCILLARY NOTES, Brief Assessment, Lab Results, Assessment/Plan   Last Updated: 14-Apr-16 09:40 by JAndria Meuse(NP)

## 2014-08-16 NOTE — Op Note (Signed)
PATIENT NAME:  Caroline Aguilar, WAHLER MR#:  [id number] DATE OF BIRTH:  Sep 19, 1925  DATE OF OPERATION:  07/27/2014.  PREOPERATIVE DIAGNOSIS:  Displaced comminuted intertrochanteric fracture of right hip.   POSTOPERATIVE DIAGNOSIS:  Displaced comminuted intertrochanteric fracture of right hip.   PROCEDURE PERFORMED:  Open reduction and internal fixation of right hip with a Synthes trochanteric fixation nail (130 degree/12 mm rod, 009 mm helical blade, 40 mm distal screw).   SURGEON:  Park Breed, M.D.   ANESTHESIA:  General endotracheal.   COMPLICATIONS:  None.   DRAINS:  None.   ESTIMATED BLOOD LOSS: 100 mL; replaced none.   DESCRIPTION OF PROCEDURE:  The patient was brought to the operating room.  She underwent satisfactory general endotracheal anesthesia in a supine position and placed on the fracture table in a supine position and padded appropriately.  She had spinal anesthesia that was precluded by recent use of Plavix.  The right hip was reduced manually and placed in traction and slight internal rotation.  Fluoroscopy showed good positioning of the fracture.  The hip was prepped and draped in sterile fashion and a short longitudinal incision was made just proximal to the trochanter.  Dissection was carried out sharply through subcutaneous tissue and fascia.  A Button guidepin was introduced through the tip of the trochanter and a 17 mm reamer then used to enlarge the opening.  The guidepin was passed down the shaft of the femur and a 130 degree  x 12 mm trochanteric fixation nail was inserted.  This was positioned under fluoroscopy.  A second stab wound was made distally to allow for the distal aiming guide.  This was advanced to the bone and tightened securely.  A guidepin was inserted under fluoroscopic control and had excellent position in the head on AP and lateral views.  On the lateral, this was measured at 105 mm.  The lateral cortex was drilled and the tract for the blade was  drilled.  A 381 mm helical blade was inserted and was well contained in bone on both AP and lateral views.  A set screw was tightened from above.  The aiming device was removed and the guide for the distal locking screw was inserted.  This was drilled and filled with a 40 mm screw.  Fluoroscopy showed all hardware to be in good position and the fracture to be in good position. The outrigger was removed.  The wounds were irrigated and closed with 0 Vicryl on deep fascia and 2-0 Vicryl on subcutaneous tissue.  The skin was closed with staples.  A dry sterile dressing was applied.  The patient was transferred to her hospital bed and taken to recovery in good condition.  She had good motion of the hip without any crepitus.    ____________________________ Park Breed, MD hem:kc D: 07/27/2014 14:56:52 ET T: 07/27/2014 15:36:19 ET JOB#: [id number]  cc: Park Breed, MD, <Dictator> Park Breed MD ELECTRONICALLY SIGNED 07/27/2014 16:25

## 2014-08-16 NOTE — Consult Note (Signed)
Brief Consult Note: Diagnosis: The patient reports 10 years of dysphagia with dilation and EGD 40 years ago. Repeorts that it is the same now as it has been for the last year. No weight loss. Denies workup in the past.   Patient was seen by consultant.   Consult note dictated.   Comments: The patient is s/p orthopedic surgery with need for transfusion. Now with 10 years of dysphagia. Recommend barium swallow and EGD as outpatient when stable and able to lay on her side for an EGD if the barium swallow identifies a treatable lesion.  Electronic Signatures: Lucilla Lame (MD)  (Signed 13-Apr-16 12:44)  Authored: Brief Consult Note   Last Updated: 13-Apr-16 12:44 by Lucilla Lame (MD)

## 2014-08-16 NOTE — H&P (Signed)
PATIENT NAME:  Caroline Aguilar, Caroline Aguilar MR#:  [id number] DATE OF BIRTH:  26-May-1925  DATE OF ADMISSION:  07/26/2014  ADMITTING PHYSICIAN:  Gladstone Lighter, MD    PRIMARY CARE PHYSICIAN: Emily Filbert, MD   CHIEF COMPLAINT: Fall and right hip pain.   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  The patient states that she has been in her normal state of health. She does have a history of chronic angina for several years, but no recent chest pains.  Three weeks ago she had whole work-up done for a past syncopal episode including MRI of the brain and carotid Dopplers and also echocardiogram and all the results came back negative.  During that work-up, she was started on Plavix and once the results came back, it is reported that her PCP said that she can come off of Plavix because everything came back negative.  Last dose of Plavix was yesterday, 07/25/2014 in the night.  The patient is also on baby aspirin.  Denies any further cardiac history, has been independent, able to do all housework without any limitations.  She had prior fractures and surgeries done without any complications.  She denies any dizziness, chest pain prior to the fall today.  She said she was walking and trying to turn herself around.  Her leg moved too fast and she had a mechanical fall. Denies any head injury.   PAST MEDICAL HISTORY:  1.  Adenocarcinoma of colon status post right hemicolectomy post chemotherapy, currently in remission.  2.  Trigeminal neuralgia.  3.   Hyperlipidemia.  4.  Glaucoma.  5.  Chronic angina currently stable.   PAST SURGICAL HISTORY:  1.  Right tibial fracture surgery.  2.  Right breast biopsy.  3.  Hysterectomy and bilateral salpingo-oophorectomy.  4.  Bladder tuck surgery.  5.  Right hemicolectomy.  6.  Left hip surgery.  7.  Right carpal tunnel surgery.  8.  Right hand, tenosynovitis surgery.  9.  LT compression fracture and kyphoplasty.   ALLERGIES TO MEDICATIONS: FAMVIR, MELOXICAM, MORPHINE, PENICILLIN, STREPTOMYCIN, ZITHROMAX.   CURRENT HOME MEDICATIONS:  1.  Carbamazepine 100 mg up to 4 times a day as needed for trigeminal neuralgia symptoms.  2.  Simvastatin 20 mg p.o. at bedtime.  3.  Plavix 75 mg p.o. daily.  4.  Aspirin 81 mg p.o. daily.  5.  Latanoprost 0.005% one drop both eyes once a day at bedtime.  6.  Humalog 0.5% one drop both eyes twice a day.  7.  Lipoic acid 600 mg p.o. daily.    SOCIAL HISTORY: Lives at home by herself; granddaughter lives next door.  No smoking or alcohol use.  Uses a cane for ambulation and usually steady on her feet but sometimes she takes fast turns, she has some falls.  No syncope.   FAMILY HISTORY: History of heart disease in the family.   REVIEW OF SYSTEMS:  CONSTITUTIONAL: No fever, fatigue, or weakness.  EYES: No blurred vision, double vision or inflammation. Status post cataract surgery and also glaucoma surgery.  ENT: No tinnitus, ear pain, hearing loss, epistaxis or discharge.  RESPIRATORY: No cough, wheeze, hemoptysis, COPD.  CARDIOVASCULAR: No chest pain, orthopnea, edema, arrhythmia, palpitations, or syncope.  GASTROINTESTINAL: No nausea, vomiting, diarrhea, abdominal pain, hematemesis, or melena.  GENITOURINARY: No dysuria, hematuria, renal calculus, frequency or incontinence.   ENDOCRINE: No polyuria, nocturia, thyroid problems, heat or cold intolerance.  HEMATOLOGY: No anemia, easy bruising bleeding.   SKIN:  No acne, rash or lesions.  MUSCULOSKELETAL:  Positive for right hip pain, after the fall, has arthritis.  NEUROLOGICAL: No numbness, weakness, CVA, TIA or, seizures.  PSYCHOLOGICAL: No anxiety, insomnia, or depression.   PHYSICAL EXAMINATION:  VITAL SIGNS: Temperature afebrile, pulse 65, respirations 18, blood pressure 192/91, pulse ox 96% percent on room air.  GENERAL: Well-built, well-nourished female lying in bed, not in any acute distress.  HEENT: Normocephalic, atraumatic. Pupils equal, round, reacting to light. Anicteric sclerae. Extraocular movements intact. Oropharynx clear without erythema, mass or exudates.  NECK: Supple, no thyromegaly, JVD or carotid bruits.  No lymphadenopathy/  LUNGS: Moving air bilaterally. No wheeze or crackles. No use of accessory muscles for breathing.  CARDIOVASCULAR: S1, S2, regular rate and rhythm 3/6 systolic murmur heard. No rubs or gallops.  ABDOMEN: Soft, nontender, nondistended. No hepatosplenomegaly. Normal bowel sounds.  EXTREMITIES: The right leg is abducted and externally rotated. There is no bruise or swelling noted in the right hip region, but there is focal tenderness. No pedal edema noted. Decreased sensation in both feet up to the mid legs from her previous neuropathy. She does have good dorsalis pedis pulses palpable bilaterally.  SKIN: No acne, rash or lesions.  LYMPHATICS: No cervical lymphadenopathy.  NEUROLOGIC: Cranial nerves intact. No focal motor or sensory deficits. Limited movement of the right leg secondary to fall and fracture.  PSYCHOLOGICAL: The patient was awake, alert, oriented x 3.   LABORATORY DATA: WBC 6.8, hemoglobin 13.0, hematocrit 39.2, platelet count 221,000. Sodium 127, potassium 4.1, chloride 92, bicarbonate 28, BUN 9, creatinine 0.46, glucose 104, and calcium 8.3.  ALT 13, AST 18, alkaline phosphatase 60, total bilirubin 0.6, and albumin 3.8, INR 0.9.   Chest x-ray showing no acute cardiopulmonary disease, mild compression deformity of T 12, recommend dedicated  thoracic spine series for further evaluation.  Right hip x-ray showing intertrochanteric proximal right femoral fracture with angulation and laterally displacement.    EKG showing normal sinus rhythm. She does have some PVCs with heart rate of 61, no acute ST-T wave abnormalities noted.   ASSESSMENT AND PLAN: An 81 year old female with past medical history significant for chronic angina, colon cancer in remission, trigeminal neuralgia and glaucoma, admitted for a fall and right hip fracture.  1.  Hip fracture, mechanical fall, admit to Ortho floor.  Orthopedics has been consulted.  Pain management, surgery tomorrow and postoperative physical therapy and deep vein thrombosis prophylaxis recommended.  2.  Preoperative evaluation, known history of chronic angina. Recent work-up negative, has chronic aortic stenosis, but no active chest pains, has been independent, and active lately. Hold aspirin and Plavix, low to moderate risk for surgery, can proceed as beenfits outweigh risks at this time.   IV fluids, correct the sodium and ortho has been consulted.  EKG with no acute changes.  3.  Hypernatremia likely prerenal dehydration. IV fluids and monitor.  4.  Hyperlipidemia. Continue statin.  5.  Glaucoma. Continue eye drops. 6.  Deep vein thrombosis prophylaxis will be started after surgery.  7.  CODE STATUS: FULL CODE.   TOTAL TIME SPENT ON ADMISSION: 50 minutes.   ____________________________ Gladstone Lighter, MD rk:DT  D: 07/26/2014 15:00:24 ET T: 07/26/2014 15:23:58 ET JOB#: [id number]  cc: Gladstone Lighter, MD, <Dictator> [name], MD Park Breed, MD  Gladstone Lighter MD ELECTRONICALLY SIGNED 08/07/2014 14:54

## 2014-08-16 NOTE — Consult Note (Signed)
PATIENT NAME:  Caroline Aguilar, Caroline Aguilar MR#:  [id number] DATE OF BIRTH:  01-18-1926  DATE OF CONSULTATION:  07/29/2014  REFERRING PHYSICIAN:   CONSULTING PHYSICIAN:  Lucilla Lame, MD  CONSULTING SERVICE: Gastroenterology.  REASON FOR CONSULTATION: Dysphagia.   HISTORY OF PRESENT ILLNESS: This patient is an 81 year old woman who was admitted with a hip fracture and a past medical history for colon cancer, status post colectomy and chemotherapy in the past. The patient reports that she has had dysphagia for many years, and states she had an upper endoscopy with dilation back approximately 40-50 years ago. She states she has had dysphagia ever since then, but states that it has been more prominent in the last 10 years with worsening in the last year, although she states that there was no change since admission, nor was it any worse in the last few months. The patient was seen by speech pathology, who recommended that the patient have evaluation of the upper GI tract to see why she may have dysphagia. The patient has had some complications of her surgery, which included bleeding and need for transfusion of blood. The patient is not having any black stools or bloody stools. She is also not having any nausea or vomiting.   PAST MEDICAL HISTORY: Adenocarcinoma of the colon with right hemicolectomy, trigeminal neuralgia, hyperlipidemia, glaucoma, chronic angina.   ALLERGIES: MELOXICAM, MORPHINE, PENICILLIN, STREPTOMYCIN, AZITHROMYCIN, AND FAMVIR.  HOME MEDICATIONS: Carbamazepine simvastatin, Plavix, aspirin, Humalog.   FAMILY HISTORY: Noncontributory.   SOCIAL HISTORY: The patient lives at home, a granddaughter lives next door. No alcohol or tobacco abuse.   REVIEW OF SYSTEMS: Ten-point review of systems negative except what is stated above.   PHYSICAL EXAMINATION:  VITAL SIGNS: Temperature 97.9, pulse 83, respirations 16, blood pressure 115/65, pulse oximetry 98% on room air.  HEENT: Normocephalic,  atraumatic. Extraocular occular motor intact. Pupils equally round and reactive to light and accommodation without JVD, without lymphadenopathy.  LUNGS: Clear to auscultation bilaterally.  HEART: Regular rate and rhythm without murmurs, rubs, or gallops.  ABDOMEN: Soft, nontender, nondistended, without hepatosplenomegaly.  EXTREMITIES: The patient is without cyanosis, clubbing, or edema, although she does have limited mobility of her right leg after a recent surgery.  SKIN: Without any rashes or lesions.  NEUROLOGICAL: Grossly intact.  PSYCHOLOGICAL: Alert and oriented x 3.   LABORATORY STUDIES: Show the patient to have a hemoglobin of 10.3 on April 13, with a hemoglobin of 7.2 this morning.   ASSESSMENT AND PLAN: This patient is an 81 year old woman who has had dysphagia for the last 10 years, who states that it has progressed over the last year, although there has been no difference in her dysphagia now, than it was 6 months ago. She is not a candidate right now for an upper endoscopy with her bleeding from her surgical site and inability to lay on her left side. The patient will be set up for an upper GI swallow with barium to look for any strictures or narrowings. If there is something that is shown to be abnormal at that time, which could be amenable to endoscopic therapy, then the patient will be considered for endoscopic therapy as an outpatient. The patient has had this for many years, and there is no rush to undergo any endoscopy at this time.   Thank you very much for involving me in the care of this patient. If you have any questions, please do not hesitate to call.     ____________________________ Lucilla Lame, MD dw:mw D:  07/29/2014 13:53:30 ET T: 07/29/2014 15:08:47 ET JOB#: [id number]  cc: Lucilla Lame, MD, <Dictator> Lucilla Lame MD ELECTRONICALLY SIGNED 07/30/2014 5:13

## 2014-08-16 NOTE — Discharge Summary (Signed)
PATIENT NAME:  Caroline Aguilar, Caroline Aguilar MR#:  [id number] DATE OF BIRTH:  Sep 22, 1925  DATE OF ADMISSION:  07/26/2014 DATE OF DISCHARGE:  07/30/2014  DISCHARGE DIAGNOSES:  1. Right intertrochanteric femoral fracture.  2. Acute blood loss anemia.  3. Solid food dysphagia.  4. Lovenox-induced thrombocytopenia.  5. Trigeminal neuralgia.  6. Mild aortic stenosis.  7. Glaucoma.   DISCHARGE MEDICATIONS: Simvastatin 20 mg at bedtime, latanoprost ophthalmic 0.05% ophthalmic 1 drop both eyes at bedtime, aspirin 81 mg daily, timolol ophthalmic 0.5% 1 drop both eyes b.i.d., Tegretol 100 mg q.i.d. p.r.n. trigeminal neuralgia, Tylenol 650 mg every 4 hours p.r.n. pain, iron sulfate 325 mg b.i.d., Dulcolax suppository daily p.r.n., Docusate 240 mg at bedtime, magnesium oxide 400 mg daily, pantoprazole 40 mg b.i.d., calcium vitamin D 500 mg b.i.d., Ensure Enlive b.i.d.   REASON FOR ADMISSION: An 81 year old female presents with right hip fracture and dysphagia. Please see H and P for history of present illness, past medical history and physical exam.   HOSPITAL COURSE: The patient was admitted, underwent right hip fixation with really no complications. She had been on Plavix the week prior to due to a mini stroke-like symptoms. Although her workup was negative, the Plavix was discontinued, but certainly still in her system. Postoperatively, she had moderate oozing from the surgical site, ultimately requiring 3 units PRBCs with a discharge hemoglobin of 8.9. Lovenox was discontinued with the bleeding and thrombocytopenia. She will be on a baby aspirin. Will need close hemoglobin followup. Hopefully will not need more transfusions. She has had, for some time, some progressive solid food dysphagia and will need her medicines crushed with pureed diet with plans for EGD and esophageal dilation post skilled nursing. Overall prognosis is guarded. Follow up with Dr. Sabra Heck in 3 weeks  ____________________________ [name],  MD mfm:AT D: 07/30/2014 08:04:19 ET T: 07/30/2014 08:20:52 ET JOB#: [id number]  cc: [name], MD, <Dictator> Asha Grumbine Roselee Culver MD ELECTRONICALLY SIGNED 07/31/2014 8:26

## 2014-08-18 DIAGNOSIS — M810 Age-related osteoporosis without current pathological fracture: Secondary | ICD-10-CM | POA: Diagnosis not present

## 2014-08-18 DIAGNOSIS — S72144D Nondisplaced intertrochanteric fracture of right femur, subsequent encounter for closed fracture with routine healing: Secondary | ICD-10-CM | POA: Diagnosis not present

## 2014-08-18 DIAGNOSIS — H409 Unspecified glaucoma: Secondary | ICD-10-CM | POA: Diagnosis not present

## 2014-08-18 DIAGNOSIS — K5900 Constipation, unspecified: Secondary | ICD-10-CM | POA: Diagnosis not present

## 2014-08-18 DIAGNOSIS — E789 Disorder of lipoprotein metabolism, unspecified: Secondary | ICD-10-CM | POA: Diagnosis not present

## 2014-08-18 DIAGNOSIS — D649 Anemia, unspecified: Secondary | ICD-10-CM | POA: Diagnosis not present

## 2014-08-18 DIAGNOSIS — I350 Nonrheumatic aortic (valve) stenosis: Secondary | ICD-10-CM | POA: Diagnosis not present

## 2014-08-18 DIAGNOSIS — I209 Angina pectoris, unspecified: Secondary | ICD-10-CM | POA: Diagnosis not present

## 2014-08-18 DIAGNOSIS — K449 Diaphragmatic hernia without obstruction or gangrene: Secondary | ICD-10-CM | POA: Diagnosis not present

## 2014-08-20 DIAGNOSIS — M47815 Spondylosis without myelopathy or radiculopathy, thoracolumbar region: Secondary | ICD-10-CM | POA: Diagnosis not present

## 2014-08-20 DIAGNOSIS — S72141D Displaced intertrochanteric fracture of right femur, subsequent encounter for closed fracture with routine healing: Secondary | ICD-10-CM | POA: Diagnosis not present

## 2014-08-20 DIAGNOSIS — Z9181 History of falling: Secondary | ICD-10-CM | POA: Diagnosis not present

## 2014-08-20 DIAGNOSIS — G629 Polyneuropathy, unspecified: Secondary | ICD-10-CM | POA: Diagnosis not present

## 2014-08-20 DIAGNOSIS — I25119 Atherosclerotic heart disease of native coronary artery with unspecified angina pectoris: Secondary | ICD-10-CM | POA: Diagnosis not present

## 2014-08-20 DIAGNOSIS — M4184 Other forms of scoliosis, thoracic region: Secondary | ICD-10-CM | POA: Diagnosis not present

## 2014-08-20 DIAGNOSIS — Z85038 Personal history of other malignant neoplasm of large intestine: Secondary | ICD-10-CM | POA: Diagnosis not present

## 2014-08-20 DIAGNOSIS — T451X5S Adverse effect of antineoplastic and immunosuppressive drugs, sequela: Secondary | ICD-10-CM | POA: Diagnosis not present

## 2014-08-24 DIAGNOSIS — T451X5S Adverse effect of antineoplastic and immunosuppressive drugs, sequela: Secondary | ICD-10-CM | POA: Diagnosis not present

## 2014-08-24 DIAGNOSIS — S72141D Displaced intertrochanteric fracture of right femur, subsequent encounter for closed fracture with routine healing: Secondary | ICD-10-CM | POA: Diagnosis not present

## 2014-08-24 DIAGNOSIS — Z85038 Personal history of other malignant neoplasm of large intestine: Secondary | ICD-10-CM | POA: Diagnosis not present

## 2014-08-24 DIAGNOSIS — Z9181 History of falling: Secondary | ICD-10-CM | POA: Diagnosis not present

## 2014-08-24 DIAGNOSIS — G629 Polyneuropathy, unspecified: Secondary | ICD-10-CM | POA: Diagnosis not present

## 2014-08-24 DIAGNOSIS — M47815 Spondylosis without myelopathy or radiculopathy, thoracolumbar region: Secondary | ICD-10-CM | POA: Diagnosis not present

## 2014-08-24 DIAGNOSIS — I25119 Atherosclerotic heart disease of native coronary artery with unspecified angina pectoris: Secondary | ICD-10-CM | POA: Diagnosis not present

## 2014-08-24 DIAGNOSIS — M4184 Other forms of scoliosis, thoracic region: Secondary | ICD-10-CM | POA: Diagnosis not present

## 2014-08-26 DIAGNOSIS — T451X5S Adverse effect of antineoplastic and immunosuppressive drugs, sequela: Secondary | ICD-10-CM | POA: Diagnosis not present

## 2014-08-26 DIAGNOSIS — Z9181 History of falling: Secondary | ICD-10-CM | POA: Diagnosis not present

## 2014-08-26 DIAGNOSIS — I25119 Atherosclerotic heart disease of native coronary artery with unspecified angina pectoris: Secondary | ICD-10-CM | POA: Diagnosis not present

## 2014-08-26 DIAGNOSIS — M4184 Other forms of scoliosis, thoracic region: Secondary | ICD-10-CM | POA: Diagnosis not present

## 2014-08-26 DIAGNOSIS — S72141D Displaced intertrochanteric fracture of right femur, subsequent encounter for closed fracture with routine healing: Secondary | ICD-10-CM | POA: Diagnosis not present

## 2014-08-26 DIAGNOSIS — M47815 Spondylosis without myelopathy or radiculopathy, thoracolumbar region: Secondary | ICD-10-CM | POA: Diagnosis not present

## 2014-08-26 DIAGNOSIS — Z85038 Personal history of other malignant neoplasm of large intestine: Secondary | ICD-10-CM | POA: Diagnosis not present

## 2014-08-26 DIAGNOSIS — G629 Polyneuropathy, unspecified: Secondary | ICD-10-CM | POA: Diagnosis not present

## 2014-08-27 DIAGNOSIS — G629 Polyneuropathy, unspecified: Secondary | ICD-10-CM | POA: Diagnosis not present

## 2014-08-27 DIAGNOSIS — T451X5S Adverse effect of antineoplastic and immunosuppressive drugs, sequela: Secondary | ICD-10-CM | POA: Diagnosis not present

## 2014-08-27 DIAGNOSIS — M47815 Spondylosis without myelopathy or radiculopathy, thoracolumbar region: Secondary | ICD-10-CM | POA: Diagnosis not present

## 2014-08-27 DIAGNOSIS — I25119 Atherosclerotic heart disease of native coronary artery with unspecified angina pectoris: Secondary | ICD-10-CM | POA: Diagnosis not present

## 2014-08-27 DIAGNOSIS — S72141D Displaced intertrochanteric fracture of right femur, subsequent encounter for closed fracture with routine healing: Secondary | ICD-10-CM | POA: Diagnosis not present

## 2014-08-27 DIAGNOSIS — Z85038 Personal history of other malignant neoplasm of large intestine: Secondary | ICD-10-CM | POA: Diagnosis not present

## 2014-08-27 DIAGNOSIS — Z9181 History of falling: Secondary | ICD-10-CM | POA: Diagnosis not present

## 2014-08-27 DIAGNOSIS — M4184 Other forms of scoliosis, thoracic region: Secondary | ICD-10-CM | POA: Diagnosis not present

## 2014-08-28 DIAGNOSIS — T451X5S Adverse effect of antineoplastic and immunosuppressive drugs, sequela: Secondary | ICD-10-CM | POA: Diagnosis not present

## 2014-08-28 DIAGNOSIS — I25119 Atherosclerotic heart disease of native coronary artery with unspecified angina pectoris: Secondary | ICD-10-CM | POA: Diagnosis not present

## 2014-08-28 DIAGNOSIS — M47815 Spondylosis without myelopathy or radiculopathy, thoracolumbar region: Secondary | ICD-10-CM | POA: Diagnosis not present

## 2014-08-28 DIAGNOSIS — Z9181 History of falling: Secondary | ICD-10-CM | POA: Diagnosis not present

## 2014-08-28 DIAGNOSIS — Z85038 Personal history of other malignant neoplasm of large intestine: Secondary | ICD-10-CM | POA: Diagnosis not present

## 2014-08-28 DIAGNOSIS — S72141D Displaced intertrochanteric fracture of right femur, subsequent encounter for closed fracture with routine healing: Secondary | ICD-10-CM | POA: Diagnosis not present

## 2014-08-28 DIAGNOSIS — M4184 Other forms of scoliosis, thoracic region: Secondary | ICD-10-CM | POA: Diagnosis not present

## 2014-08-28 DIAGNOSIS — G629 Polyneuropathy, unspecified: Secondary | ICD-10-CM | POA: Diagnosis not present

## 2014-08-31 DIAGNOSIS — Z85038 Personal history of other malignant neoplasm of large intestine: Secondary | ICD-10-CM | POA: Diagnosis not present

## 2014-08-31 DIAGNOSIS — I25119 Atherosclerotic heart disease of native coronary artery with unspecified angina pectoris: Secondary | ICD-10-CM | POA: Diagnosis not present

## 2014-08-31 DIAGNOSIS — T451X5S Adverse effect of antineoplastic and immunosuppressive drugs, sequela: Secondary | ICD-10-CM | POA: Diagnosis not present

## 2014-08-31 DIAGNOSIS — Z9181 History of falling: Secondary | ICD-10-CM | POA: Diagnosis not present

## 2014-08-31 DIAGNOSIS — S72141D Displaced intertrochanteric fracture of right femur, subsequent encounter for closed fracture with routine healing: Secondary | ICD-10-CM | POA: Diagnosis not present

## 2014-08-31 DIAGNOSIS — G629 Polyneuropathy, unspecified: Secondary | ICD-10-CM | POA: Diagnosis not present

## 2014-08-31 DIAGNOSIS — M47815 Spondylosis without myelopathy or radiculopathy, thoracolumbar region: Secondary | ICD-10-CM | POA: Diagnosis not present

## 2014-08-31 DIAGNOSIS — M4184 Other forms of scoliosis, thoracic region: Secondary | ICD-10-CM | POA: Diagnosis not present

## 2014-09-01 DIAGNOSIS — Z9181 History of falling: Secondary | ICD-10-CM | POA: Diagnosis not present

## 2014-09-01 DIAGNOSIS — S72141D Displaced intertrochanteric fracture of right femur, subsequent encounter for closed fracture with routine healing: Secondary | ICD-10-CM | POA: Diagnosis not present

## 2014-09-01 DIAGNOSIS — G629 Polyneuropathy, unspecified: Secondary | ICD-10-CM | POA: Diagnosis not present

## 2014-09-01 DIAGNOSIS — M4184 Other forms of scoliosis, thoracic region: Secondary | ICD-10-CM | POA: Diagnosis not present

## 2014-09-01 DIAGNOSIS — Z85038 Personal history of other malignant neoplasm of large intestine: Secondary | ICD-10-CM | POA: Diagnosis not present

## 2014-09-01 DIAGNOSIS — I25119 Atherosclerotic heart disease of native coronary artery with unspecified angina pectoris: Secondary | ICD-10-CM | POA: Diagnosis not present

## 2014-09-01 DIAGNOSIS — M47815 Spondylosis without myelopathy or radiculopathy, thoracolumbar region: Secondary | ICD-10-CM | POA: Diagnosis not present

## 2014-09-01 DIAGNOSIS — T451X5S Adverse effect of antineoplastic and immunosuppressive drugs, sequela: Secondary | ICD-10-CM | POA: Diagnosis not present

## 2014-09-03 DIAGNOSIS — S72141D Displaced intertrochanteric fracture of right femur, subsequent encounter for closed fracture with routine healing: Secondary | ICD-10-CM | POA: Diagnosis not present

## 2014-09-03 DIAGNOSIS — Z9181 History of falling: Secondary | ICD-10-CM | POA: Diagnosis not present

## 2014-09-03 DIAGNOSIS — I25119 Atherosclerotic heart disease of native coronary artery with unspecified angina pectoris: Secondary | ICD-10-CM | POA: Diagnosis not present

## 2014-09-03 DIAGNOSIS — Z85038 Personal history of other malignant neoplasm of large intestine: Secondary | ICD-10-CM | POA: Diagnosis not present

## 2014-09-03 DIAGNOSIS — M4184 Other forms of scoliosis, thoracic region: Secondary | ICD-10-CM | POA: Diagnosis not present

## 2014-09-03 DIAGNOSIS — T451X5S Adverse effect of antineoplastic and immunosuppressive drugs, sequela: Secondary | ICD-10-CM | POA: Diagnosis not present

## 2014-09-03 DIAGNOSIS — G629 Polyneuropathy, unspecified: Secondary | ICD-10-CM | POA: Diagnosis not present

## 2014-09-03 DIAGNOSIS — M47815 Spondylosis without myelopathy or radiculopathy, thoracolumbar region: Secondary | ICD-10-CM | POA: Diagnosis not present

## 2014-09-04 DIAGNOSIS — S72141D Displaced intertrochanteric fracture of right femur, subsequent encounter for closed fracture with routine healing: Secondary | ICD-10-CM | POA: Diagnosis not present

## 2014-09-04 DIAGNOSIS — G629 Polyneuropathy, unspecified: Secondary | ICD-10-CM | POA: Diagnosis not present

## 2014-09-04 DIAGNOSIS — M4184 Other forms of scoliosis, thoracic region: Secondary | ICD-10-CM | POA: Diagnosis not present

## 2014-09-04 DIAGNOSIS — I25119 Atherosclerotic heart disease of native coronary artery with unspecified angina pectoris: Secondary | ICD-10-CM | POA: Diagnosis not present

## 2014-09-04 DIAGNOSIS — Z85038 Personal history of other malignant neoplasm of large intestine: Secondary | ICD-10-CM | POA: Diagnosis not present

## 2014-09-04 DIAGNOSIS — M47815 Spondylosis without myelopathy or radiculopathy, thoracolumbar region: Secondary | ICD-10-CM | POA: Diagnosis not present

## 2014-09-04 DIAGNOSIS — Z9181 History of falling: Secondary | ICD-10-CM | POA: Diagnosis not present

## 2014-09-04 DIAGNOSIS — T451X5S Adverse effect of antineoplastic and immunosuppressive drugs, sequela: Secondary | ICD-10-CM | POA: Diagnosis not present

## 2014-09-07 DIAGNOSIS — S72141D Displaced intertrochanteric fracture of right femur, subsequent encounter for closed fracture with routine healing: Secondary | ICD-10-CM | POA: Diagnosis not present

## 2014-09-07 DIAGNOSIS — T451X5S Adverse effect of antineoplastic and immunosuppressive drugs, sequela: Secondary | ICD-10-CM | POA: Diagnosis not present

## 2014-09-07 DIAGNOSIS — G629 Polyneuropathy, unspecified: Secondary | ICD-10-CM | POA: Diagnosis not present

## 2014-09-07 DIAGNOSIS — M4184 Other forms of scoliosis, thoracic region: Secondary | ICD-10-CM | POA: Diagnosis not present

## 2014-09-07 DIAGNOSIS — Z9181 History of falling: Secondary | ICD-10-CM | POA: Diagnosis not present

## 2014-09-07 DIAGNOSIS — M47815 Spondylosis without myelopathy or radiculopathy, thoracolumbar region: Secondary | ICD-10-CM | POA: Diagnosis not present

## 2014-09-07 DIAGNOSIS — I25119 Atherosclerotic heart disease of native coronary artery with unspecified angina pectoris: Secondary | ICD-10-CM | POA: Diagnosis not present

## 2014-09-07 DIAGNOSIS — Z85038 Personal history of other malignant neoplasm of large intestine: Secondary | ICD-10-CM | POA: Diagnosis not present

## 2014-09-08 DIAGNOSIS — Z85038 Personal history of other malignant neoplasm of large intestine: Secondary | ICD-10-CM | POA: Diagnosis not present

## 2014-09-08 DIAGNOSIS — M47815 Spondylosis without myelopathy or radiculopathy, thoracolumbar region: Secondary | ICD-10-CM | POA: Diagnosis not present

## 2014-09-08 DIAGNOSIS — I25119 Atherosclerotic heart disease of native coronary artery with unspecified angina pectoris: Secondary | ICD-10-CM | POA: Diagnosis not present

## 2014-09-08 DIAGNOSIS — M4184 Other forms of scoliosis, thoracic region: Secondary | ICD-10-CM | POA: Diagnosis not present

## 2014-09-08 DIAGNOSIS — G629 Polyneuropathy, unspecified: Secondary | ICD-10-CM | POA: Diagnosis not present

## 2014-09-08 DIAGNOSIS — T451X5S Adverse effect of antineoplastic and immunosuppressive drugs, sequela: Secondary | ICD-10-CM | POA: Diagnosis not present

## 2014-09-08 DIAGNOSIS — Z9181 History of falling: Secondary | ICD-10-CM | POA: Diagnosis not present

## 2014-09-08 DIAGNOSIS — S72141D Displaced intertrochanteric fracture of right femur, subsequent encounter for closed fracture with routine healing: Secondary | ICD-10-CM | POA: Diagnosis not present

## 2014-09-09 DIAGNOSIS — G629 Polyneuropathy, unspecified: Secondary | ICD-10-CM | POA: Diagnosis not present

## 2014-09-09 DIAGNOSIS — I25119 Atherosclerotic heart disease of native coronary artery with unspecified angina pectoris: Secondary | ICD-10-CM | POA: Diagnosis not present

## 2014-09-09 DIAGNOSIS — M4184 Other forms of scoliosis, thoracic region: Secondary | ICD-10-CM | POA: Diagnosis not present

## 2014-09-09 DIAGNOSIS — M47815 Spondylosis without myelopathy or radiculopathy, thoracolumbar region: Secondary | ICD-10-CM | POA: Diagnosis not present

## 2014-09-09 DIAGNOSIS — Z85038 Personal history of other malignant neoplasm of large intestine: Secondary | ICD-10-CM | POA: Diagnosis not present

## 2014-09-09 DIAGNOSIS — T451X5S Adverse effect of antineoplastic and immunosuppressive drugs, sequela: Secondary | ICD-10-CM | POA: Diagnosis not present

## 2014-09-09 DIAGNOSIS — Z9181 History of falling: Secondary | ICD-10-CM | POA: Diagnosis not present

## 2014-09-09 DIAGNOSIS — S72141D Displaced intertrochanteric fracture of right femur, subsequent encounter for closed fracture with routine healing: Secondary | ICD-10-CM | POA: Diagnosis not present

## 2014-09-12 DIAGNOSIS — M4184 Other forms of scoliosis, thoracic region: Secondary | ICD-10-CM | POA: Diagnosis not present

## 2014-09-12 DIAGNOSIS — I25119 Atherosclerotic heart disease of native coronary artery with unspecified angina pectoris: Secondary | ICD-10-CM | POA: Diagnosis not present

## 2014-09-12 DIAGNOSIS — S72141D Displaced intertrochanteric fracture of right femur, subsequent encounter for closed fracture with routine healing: Secondary | ICD-10-CM | POA: Diagnosis not present

## 2014-09-12 DIAGNOSIS — T451X5S Adverse effect of antineoplastic and immunosuppressive drugs, sequela: Secondary | ICD-10-CM | POA: Diagnosis not present

## 2014-09-12 DIAGNOSIS — Z9181 History of falling: Secondary | ICD-10-CM | POA: Diagnosis not present

## 2014-09-12 DIAGNOSIS — Z85038 Personal history of other malignant neoplasm of large intestine: Secondary | ICD-10-CM | POA: Diagnosis not present

## 2014-09-12 DIAGNOSIS — M47815 Spondylosis without myelopathy or radiculopathy, thoracolumbar region: Secondary | ICD-10-CM | POA: Diagnosis not present

## 2014-09-12 DIAGNOSIS — G629 Polyneuropathy, unspecified: Secondary | ICD-10-CM | POA: Diagnosis not present

## 2014-09-16 DIAGNOSIS — Z85038 Personal history of other malignant neoplasm of large intestine: Secondary | ICD-10-CM | POA: Diagnosis not present

## 2014-09-16 DIAGNOSIS — M47815 Spondylosis without myelopathy or radiculopathy, thoracolumbar region: Secondary | ICD-10-CM | POA: Diagnosis not present

## 2014-09-16 DIAGNOSIS — G629 Polyneuropathy, unspecified: Secondary | ICD-10-CM | POA: Diagnosis not present

## 2014-09-16 DIAGNOSIS — Z9181 History of falling: Secondary | ICD-10-CM | POA: Diagnosis not present

## 2014-09-16 DIAGNOSIS — I25119 Atherosclerotic heart disease of native coronary artery with unspecified angina pectoris: Secondary | ICD-10-CM | POA: Diagnosis not present

## 2014-09-16 DIAGNOSIS — T451X5S Adverse effect of antineoplastic and immunosuppressive drugs, sequela: Secondary | ICD-10-CM | POA: Diagnosis not present

## 2014-09-16 DIAGNOSIS — M4184 Other forms of scoliosis, thoracic region: Secondary | ICD-10-CM | POA: Diagnosis not present

## 2014-09-16 DIAGNOSIS — S72141D Displaced intertrochanteric fracture of right femur, subsequent encounter for closed fracture with routine healing: Secondary | ICD-10-CM | POA: Diagnosis not present

## 2014-09-18 DIAGNOSIS — S72141D Displaced intertrochanteric fracture of right femur, subsequent encounter for closed fracture with routine healing: Secondary | ICD-10-CM | POA: Diagnosis not present

## 2014-09-18 DIAGNOSIS — M47815 Spondylosis without myelopathy or radiculopathy, thoracolumbar region: Secondary | ICD-10-CM | POA: Diagnosis not present

## 2014-09-18 DIAGNOSIS — T451X5S Adverse effect of antineoplastic and immunosuppressive drugs, sequela: Secondary | ICD-10-CM | POA: Diagnosis not present

## 2014-09-18 DIAGNOSIS — I25119 Atherosclerotic heart disease of native coronary artery with unspecified angina pectoris: Secondary | ICD-10-CM | POA: Diagnosis not present

## 2014-09-18 DIAGNOSIS — M4184 Other forms of scoliosis, thoracic region: Secondary | ICD-10-CM | POA: Diagnosis not present

## 2014-09-18 DIAGNOSIS — Z85038 Personal history of other malignant neoplasm of large intestine: Secondary | ICD-10-CM | POA: Diagnosis not present

## 2014-09-18 DIAGNOSIS — G629 Polyneuropathy, unspecified: Secondary | ICD-10-CM | POA: Diagnosis not present

## 2014-09-18 DIAGNOSIS — Z9181 History of falling: Secondary | ICD-10-CM | POA: Diagnosis not present

## 2014-09-29 DIAGNOSIS — S72141D Displaced intertrochanteric fracture of right femur, subsequent encounter for closed fracture with routine healing: Secondary | ICD-10-CM | POA: Diagnosis not present

## 2014-09-29 DIAGNOSIS — G629 Polyneuropathy, unspecified: Secondary | ICD-10-CM | POA: Diagnosis not present

## 2014-09-29 DIAGNOSIS — T451X5S Adverse effect of antineoplastic and immunosuppressive drugs, sequela: Secondary | ICD-10-CM | POA: Diagnosis not present

## 2014-09-29 DIAGNOSIS — I25119 Atherosclerotic heart disease of native coronary artery with unspecified angina pectoris: Secondary | ICD-10-CM | POA: Diagnosis not present

## 2014-09-29 DIAGNOSIS — M47815 Spondylosis without myelopathy or radiculopathy, thoracolumbar region: Secondary | ICD-10-CM | POA: Diagnosis not present

## 2014-09-29 DIAGNOSIS — Z85038 Personal history of other malignant neoplasm of large intestine: Secondary | ICD-10-CM | POA: Diagnosis not present

## 2014-09-29 DIAGNOSIS — Z9181 History of falling: Secondary | ICD-10-CM | POA: Diagnosis not present

## 2014-09-29 DIAGNOSIS — M4184 Other forms of scoliosis, thoracic region: Secondary | ICD-10-CM | POA: Diagnosis not present

## 2014-10-02 DIAGNOSIS — S72141D Displaced intertrochanteric fracture of right femur, subsequent encounter for closed fracture with routine healing: Secondary | ICD-10-CM | POA: Diagnosis not present

## 2014-10-02 DIAGNOSIS — M47815 Spondylosis without myelopathy or radiculopathy, thoracolumbar region: Secondary | ICD-10-CM | POA: Diagnosis not present

## 2014-10-02 DIAGNOSIS — M4184 Other forms of scoliosis, thoracic region: Secondary | ICD-10-CM | POA: Diagnosis not present

## 2014-10-02 DIAGNOSIS — Z85038 Personal history of other malignant neoplasm of large intestine: Secondary | ICD-10-CM | POA: Diagnosis not present

## 2014-10-02 DIAGNOSIS — T451X5S Adverse effect of antineoplastic and immunosuppressive drugs, sequela: Secondary | ICD-10-CM | POA: Diagnosis not present

## 2014-10-02 DIAGNOSIS — I25119 Atherosclerotic heart disease of native coronary artery with unspecified angina pectoris: Secondary | ICD-10-CM | POA: Diagnosis not present

## 2014-10-02 DIAGNOSIS — G629 Polyneuropathy, unspecified: Secondary | ICD-10-CM | POA: Diagnosis not present

## 2014-10-02 DIAGNOSIS — Z9181 History of falling: Secondary | ICD-10-CM | POA: Diagnosis not present

## 2014-10-06 DIAGNOSIS — S72141D Displaced intertrochanteric fracture of right femur, subsequent encounter for closed fracture with routine healing: Secondary | ICD-10-CM | POA: Diagnosis not present

## 2014-10-07 DIAGNOSIS — G629 Polyneuropathy, unspecified: Secondary | ICD-10-CM | POA: Diagnosis not present

## 2014-10-07 DIAGNOSIS — M47815 Spondylosis without myelopathy or radiculopathy, thoracolumbar region: Secondary | ICD-10-CM | POA: Diagnosis not present

## 2014-10-07 DIAGNOSIS — T451X5S Adverse effect of antineoplastic and immunosuppressive drugs, sequela: Secondary | ICD-10-CM | POA: Diagnosis not present

## 2014-10-07 DIAGNOSIS — Z9181 History of falling: Secondary | ICD-10-CM | POA: Diagnosis not present

## 2014-10-07 DIAGNOSIS — S72141D Displaced intertrochanteric fracture of right femur, subsequent encounter for closed fracture with routine healing: Secondary | ICD-10-CM | POA: Diagnosis not present

## 2014-10-07 DIAGNOSIS — I25119 Atherosclerotic heart disease of native coronary artery with unspecified angina pectoris: Secondary | ICD-10-CM | POA: Diagnosis not present

## 2014-10-07 DIAGNOSIS — M4184 Other forms of scoliosis, thoracic region: Secondary | ICD-10-CM | POA: Diagnosis not present

## 2014-10-07 DIAGNOSIS — Z85038 Personal history of other malignant neoplasm of large intestine: Secondary | ICD-10-CM | POA: Diagnosis not present

## 2014-10-14 DIAGNOSIS — M47815 Spondylosis without myelopathy or radiculopathy, thoracolumbar region: Secondary | ICD-10-CM | POA: Diagnosis not present

## 2014-10-14 DIAGNOSIS — G629 Polyneuropathy, unspecified: Secondary | ICD-10-CM | POA: Diagnosis not present

## 2014-10-14 DIAGNOSIS — S72141D Displaced intertrochanteric fracture of right femur, subsequent encounter for closed fracture with routine healing: Secondary | ICD-10-CM | POA: Diagnosis not present

## 2014-10-14 DIAGNOSIS — Z85038 Personal history of other malignant neoplasm of large intestine: Secondary | ICD-10-CM | POA: Diagnosis not present

## 2014-10-14 DIAGNOSIS — Z9181 History of falling: Secondary | ICD-10-CM | POA: Diagnosis not present

## 2014-10-14 DIAGNOSIS — M4184 Other forms of scoliosis, thoracic region: Secondary | ICD-10-CM | POA: Diagnosis not present

## 2014-10-14 DIAGNOSIS — I25119 Atherosclerotic heart disease of native coronary artery with unspecified angina pectoris: Secondary | ICD-10-CM | POA: Diagnosis not present

## 2014-10-14 DIAGNOSIS — T451X5S Adverse effect of antineoplastic and immunosuppressive drugs, sequela: Secondary | ICD-10-CM | POA: Diagnosis not present

## 2014-12-07 DIAGNOSIS — S72141D Displaced intertrochanteric fracture of right femur, subsequent encounter for closed fracture with routine healing: Secondary | ICD-10-CM | POA: Diagnosis not present

## 2014-12-15 DIAGNOSIS — S32000S Wedge compression fracture of unspecified lumbar vertebra, sequela: Secondary | ICD-10-CM | POA: Diagnosis not present

## 2014-12-15 DIAGNOSIS — Z0000 Encounter for general adult medical examination without abnormal findings: Secondary | ICD-10-CM | POA: Diagnosis not present

## 2014-12-15 DIAGNOSIS — E782 Mixed hyperlipidemia: Secondary | ICD-10-CM | POA: Diagnosis not present

## 2014-12-15 DIAGNOSIS — G620 Drug-induced polyneuropathy: Secondary | ICD-10-CM | POA: Diagnosis not present

## 2014-12-17 DIAGNOSIS — G620 Drug-induced polyneuropathy: Secondary | ICD-10-CM | POA: Diagnosis not present

## 2014-12-17 DIAGNOSIS — Z79899 Other long term (current) drug therapy: Secondary | ICD-10-CM | POA: Diagnosis not present

## 2014-12-17 DIAGNOSIS — E782 Mixed hyperlipidemia: Secondary | ICD-10-CM | POA: Diagnosis not present

## 2014-12-17 DIAGNOSIS — I350 Nonrheumatic aortic (valve) stenosis: Secondary | ICD-10-CM | POA: Diagnosis not present

## 2014-12-23 DIAGNOSIS — H4011X1 Primary open-angle glaucoma, mild stage: Secondary | ICD-10-CM | POA: Diagnosis not present

## 2015-04-07 DIAGNOSIS — Z0000 Encounter for general adult medical examination without abnormal findings: Secondary | ICD-10-CM | POA: Diagnosis not present

## 2015-04-07 DIAGNOSIS — E782 Mixed hyperlipidemia: Secondary | ICD-10-CM | POA: Diagnosis not present

## 2015-04-07 DIAGNOSIS — G620 Drug-induced polyneuropathy: Secondary | ICD-10-CM | POA: Diagnosis not present

## 2015-04-07 DIAGNOSIS — M810 Age-related osteoporosis without current pathological fracture: Secondary | ICD-10-CM | POA: Diagnosis not present

## 2015-04-07 DIAGNOSIS — S32000S Wedge compression fracture of unspecified lumbar vertebra, sequela: Secondary | ICD-10-CM | POA: Diagnosis not present

## 2015-04-18 HISTORY — PX: VALVE REPLACEMENT: SUR13

## 2015-04-30 DIAGNOSIS — R002 Palpitations: Secondary | ICD-10-CM | POA: Diagnosis not present

## 2015-04-30 DIAGNOSIS — G620 Drug-induced polyneuropathy: Secondary | ICD-10-CM | POA: Diagnosis not present

## 2015-04-30 DIAGNOSIS — R079 Chest pain, unspecified: Secondary | ICD-10-CM | POA: Diagnosis not present

## 2015-06-15 DIAGNOSIS — H401111 Primary open-angle glaucoma, right eye, mild stage: Secondary | ICD-10-CM | POA: Diagnosis not present

## 2015-06-18 ENCOUNTER — Encounter: Payer: Self-pay | Admitting: Emergency Medicine

## 2015-06-18 ENCOUNTER — Observation Stay
Admission: EM | Admit: 2015-06-18 | Discharge: 2015-06-20 | Disposition: A | Discharge status: Home / Self Care | Payer: Commercial Managed Care - HMO | Attending: Internal Medicine | Admitting: Internal Medicine

## 2015-06-18 ENCOUNTER — Emergency Department: Payer: Commercial Managed Care - HMO

## 2015-06-18 DIAGNOSIS — Z9889 Other specified postprocedural states: Secondary | ICD-10-CM | POA: Diagnosis not present

## 2015-06-18 DIAGNOSIS — Z85038 Personal history of other malignant neoplasm of large intestine: Secondary | ICD-10-CM | POA: Diagnosis not present

## 2015-06-18 DIAGNOSIS — Z87892 Personal history of anaphylaxis: Secondary | ICD-10-CM | POA: Insufficient documentation

## 2015-06-18 DIAGNOSIS — E785 Hyperlipidemia, unspecified: Secondary | ICD-10-CM | POA: Diagnosis not present

## 2015-06-18 DIAGNOSIS — I712 Thoracic aortic aneurysm, without rupture: Secondary | ICD-10-CM | POA: Diagnosis not present

## 2015-06-18 DIAGNOSIS — I200 Unstable angina: Secondary | ICD-10-CM | POA: Diagnosis not present

## 2015-06-18 DIAGNOSIS — Z9841 Cataract extraction status, right eye: Secondary | ICD-10-CM | POA: Diagnosis not present

## 2015-06-18 DIAGNOSIS — Z9842 Cataract extraction status, left eye: Secondary | ICD-10-CM | POA: Insufficient documentation

## 2015-06-18 DIAGNOSIS — Z880 Allergy status to penicillin: Secondary | ICD-10-CM | POA: Insufficient documentation

## 2015-06-18 DIAGNOSIS — Z8249 Family history of ischemic heart disease and other diseases of the circulatory system: Secondary | ICD-10-CM | POA: Insufficient documentation

## 2015-06-18 DIAGNOSIS — R079 Chest pain, unspecified: Secondary | ICD-10-CM | POA: Diagnosis not present

## 2015-06-18 DIAGNOSIS — M19012 Primary osteoarthritis, left shoulder: Secondary | ICD-10-CM | POA: Insufficient documentation

## 2015-06-18 DIAGNOSIS — R071 Chest pain on breathing: Principal | ICD-10-CM | POA: Insufficient documentation

## 2015-06-18 DIAGNOSIS — R0789 Other chest pain: Secondary | ICD-10-CM | POA: Diagnosis not present

## 2015-06-18 HISTORY — DX: Malignant (primary) neoplasm, unspecified: C80.1

## 2015-06-18 HISTORY — DX: Hyperlipidemia, unspecified: E78.5

## 2015-06-18 HISTORY — DX: Angina pectoris, unspecified: I20.9

## 2015-06-18 LAB — CBC
HCT: 40.6 % (ref 35.0–47.0)
HEMOGLOBIN: 13.5 g/dL (ref 12.0–16.0)
MCH: 29.9 pg (ref 26.0–34.0)
MCHC: 33.3 g/dL (ref 32.0–36.0)
MCV: 89.8 fL (ref 80.0–100.0)
PLATELETS: 158 10*3/uL (ref 150–440)
RBC: 4.52 MIL/uL (ref 3.80–5.20)
RDW: 13.1 % (ref 11.5–14.5)
WBC: 11.2 10*3/uL — ABNORMAL HIGH (ref 3.6–11.0)

## 2015-06-18 LAB — BASIC METABOLIC PANEL
Anion gap: 5 (ref 5–15)
BUN: 16 mg/dL (ref 6–20)
CHLORIDE: 98 mmol/L — AB (ref 101–111)
CO2: 33 mmol/L — ABNORMAL HIGH (ref 22–32)
CREATININE: 0.62 mg/dL (ref 0.44–1.00)
Calcium: 9.6 mg/dL (ref 8.9–10.3)
GFR calc non Af Amer: >60 mL/min (ref >60)
GLUCOSE: 140 mg/dL — AB (ref 65–99)
Potassium: 4.1 mmol/L (ref 3.5–5.1)
Sodium: 136 mmol/L (ref 135–145)

## 2015-06-18 LAB — TROPONIN I

## 2015-06-18 MED ORDER — MORPHINE SULFATE (PF) 2 MG/ML IV SOLN
2.0000 mg | Freq: Once | INTRAVENOUS | Status: AC
  Administered 2015-06-19: 2 mg via INTRAVENOUS
  Filled 2015-06-18: qty 1

## 2015-06-18 MED ORDER — ONDANSETRON HCL 4 MG/2ML IJ SOLN
4.0000 mg | Freq: Once | INTRAMUSCULAR | Status: AC
  Administered 2015-06-19: 4 mg via INTRAVENOUS
  Filled 2015-06-18: qty 2

## 2015-06-18 NOTE — ED Provider Notes (Signed)
[facility] Emergency Department Provider Note  ____________________________________________  Time seen: Approximately 11:42 PM  I have reviewed the triage vital signs and the nursing notes.   HISTORY  Chief Complaint Chest Pain    HPI Caroline Aguilar is a 82 y.o. female who presents to the ED from home via EMS with a chief complain of chest pain. Patient has a history of hyperlipidemia and anginawho experienced onset of central chest discomfort approximately 3PM. Describes waxing/waning central chest "pleurisy" radiating directly into her back. Symptoms not associated with diaphoresis, shortness of breath, nausea, vomiting, palpitations or dizziness. Patient states pain worsens with inspiration. She took a nitroglycerin at home prior to arrival without relief. She was administered 324 mg aspirin by EMS. Denies recent fever, chills, cough, congestion, abdominal pain, diarrhea. Denies recent travel, trauma or hormone use.   Past Medical History  Diagnosis Date  . Angina pectoris ([place])   . Hyperlipidemia   . Cancer [facility])     Colon cancer  1. Adenocarcinoma of colon status post right hemicolectomy post chemotherapy, currently in remission.  2. Trigeminal neuralgia.  3. Hyperlipidemia.  4. Glaucoma.  5. Chronic angina currently stable.   There are no active problems to display for this patient.   Past Surgical History  Procedure Laterality Date  . Hip surgery Bilateral   . Colon surgery    . Cataract extraction Bilateral   1. Right tibial fracture surgery.  2. Right breast biopsy.  3. Hysterectomy and bilateral salpingo-oophorectomy.  4. Bladder tuck surgery.  5. Right hemicolectomy.  6. Left hip surgery.  7. Right carpal tunnel surgery.  8. Right hand, tenosynovitis surgery.  9. LT compression fracture and kyphoplasty.   No current outpatient prescriptions on file.  Allergies Penicillins  Family History  Problem  Relation Age of Onset  . Cancer Mother   . Heart failure Father     Social History Social History  Substance Use Topics  . Smoking status: Never Smoker   . Smokeless tobacco: None  . Alcohol Use: No    Review of Systems  Constitutional: No fever/chills. Eyes: No visual changes. ENT: No sore throat. Cardiovascular: Positive for chest pain. Respiratory: Denies shortness of breath. Gastrointestinal: No abdominal pain.  No nausea, no vomiting.  No diarrhea.  No constipation. Genitourinary: Negative for dysuria. Musculoskeletal: Negative for back pain. Skin: Negative for rash. Neurological: Negative for headaches, focal weakness or numbness.  10-point ROS otherwise negative.  ____________________________________________   PHYSICAL EXAM:  VITAL SIGNS: ED Triage Vitals  Enc Vitals Group     BP 06/18/15 2217 172/71 mmHg     Pulse Rate 06/18/15 2217 71     Resp 06/18/15 2217 19     Temp 06/18/15 2217 99.2 F (37.3 C)     Temp Source 06/18/15 2217 Oral     SpO2 06/18/15 2217 99 %     Weight --      Height --      Head Cir --      Peak Flow --      Pain Score 06/18/15 2218 6     Pain Loc --      Pain Edu? --      Excl. in [place]? --     Constitutional: Alert and oriented. Well appearing and in no acute distress. Eyes: Conjunctivae are normal. PERRL. EOMI. Head: Atraumatic. Nose: No congestion/rhinnorhea. Mouth/Throat: Mucous membranes are moist.  Oropharynx non-erythematous. Neck: No stridor.   Cardiovascular: Normal rate, regular rhythm. II/VI SEM.  Good peripheral circulation. Respiratory: Normal respiratory effort.  No retractions. Lungs with faint rales bibasilarly. Gastrointestinal: Soft and nontender. No distention. No abdominal bruits. No CVA tenderness. Musculoskeletal: No lower extremity tenderness nor edema.  No joint effusions. Neurologic:  Normal speech and language. No gross focal neurologic deficits are appreciated.  Skin:  Skin is warm, dry and intact.  No rash noted. Psychiatric: Mood and affect are normal. Speech and behavior are normal.  ____________________________________________   LABS (all labs ordered are listed, but only abnormal results are displayed)  Labs Reviewed  BASIC METABOLIC PANEL - Abnormal; Notable for the following:    Chloride 98 (*)    CO2 33 (*)    Glucose, Bld 140 (*)    All other components within normal limits  CBC - Abnormal; Notable for the following:    WBC 11.2 (*)    All other components within normal limits  BRAIN NATRIURETIC PEPTIDE - Abnormal; Notable for the following:    B Natriuretic Peptide 244.0 (*)    All other components within normal limits  URINALYSIS COMPLETEWITH MICROSCOPIC ([facility] ONLY) - Abnormal; Notable for the following:    Color, Urine YELLOW (*)    APPearance HAZY (*)    Hgb urine dipstick 3+ (*)    Nitrite POSITIVE (*)    Bacteria, UA FEW (*)    Squamous Epithelial / LPF 0-5 (*)    All other components within normal limits  TROPONIN I   ____________________________________________  EKG  ED ECG REPORT I, Sarkis Rhines J, the attending physician, personally viewed and interpreted this ECG.   Date: 06/18/2015  EKG Time: 2215  Rate: 75  Rhythm: normal EKG, normal sinus rhythm  Axis: Normal  Intervals:none  ST&T Change: Nonspecific  ____________________________________________  RADIOLOGY  Chest 2 view (viewed by me, interpreted per Dr. Radene Knee): Borderline cardiomegaly. Lungs remain grossly clear. ____________________________________________   PROCEDURES  Procedure(s) performed: None  Critical Care performed: No  ____________________________________________   INITIAL IMPRESSION / ASSESSMENT AND PLAN / ED COURSE  Pertinent labs & imaging results that were available during my care of the patient were reviewed by me and considered in my medical decision making (see chart for details).  82 year old female who presents with central chest discomfort, worse with  inspiration. Initial troponin is negative. Will obtain CTA chest to evaluate for PE.  ----------------------------------------- 1:38 AM on 06/19/2015 -----------------------------------------  Updated patient and family members regarding CTA results. Discussed with hospitalist who will evaluate patient in the emergency department. Nitroglycerin paste will be administered for her residual chest discomfort. No change on patient's cardiac monitor from prior. ____________________________________________   FINAL CLINICAL IMPRESSION(S) / ED DIAGNOSES  Final diagnoses:  Chest pain, unspecified chest pain type  Unstable angina (HCC)      Paulette Blanch, MD 06/19/15 [id number]

## 2015-06-18 NOTE — ED Notes (Signed)
Per EMS patient called in for chest pain radiating to neck and back.  Pt took nitro but reports no relief.  324mg  ASA administered by EMS en route to [facility].  Pt has hx of hyperlipidemia.  Pt presents with no distress, SOB, or dizziness.  Patient placed on monitor and satting at 99 on room air.

## 2015-06-18 NOTE — ED Notes (Signed)
Patient transported to CT 

## 2015-06-18 NOTE — ED Notes (Signed)
Patient returned from XR. 

## 2015-06-18 NOTE — ED Notes (Signed)
Patient transported to X-ray 

## 2015-06-18 NOTE — ED Notes (Signed)
MD bedside

## 2015-06-19 ENCOUNTER — Emergency Department: Payer: Commercial Managed Care - HMO

## 2015-06-19 ENCOUNTER — Observation Stay
Admit: 2015-06-19 | Discharge: 2015-06-19 | Disposition: A | Discharge status: Home / Self Care | Payer: Commercial Managed Care - HMO | Attending: Internal Medicine | Admitting: Internal Medicine

## 2015-06-19 DIAGNOSIS — I712 Thoracic aortic aneurysm, without rupture: Secondary | ICD-10-CM | POA: Diagnosis not present

## 2015-06-19 DIAGNOSIS — R079 Chest pain, unspecified: Secondary | ICD-10-CM | POA: Diagnosis not present

## 2015-06-19 DIAGNOSIS — E782 Mixed hyperlipidemia: Secondary | ICD-10-CM | POA: Diagnosis not present

## 2015-06-19 DIAGNOSIS — I200 Unstable angina: Secondary | ICD-10-CM | POA: Diagnosis not present

## 2015-06-19 DIAGNOSIS — C187 Malignant neoplasm of sigmoid colon: Secondary | ICD-10-CM | POA: Diagnosis not present

## 2015-06-19 LAB — URINALYSIS COMPLETE WITH MICROSCOPIC (ARMC ONLY)
Bilirubin Urine: NEGATIVE
Glucose, UA: NEGATIVE mg/dL
Ketones, ur: NEGATIVE mg/dL
LEUKOCYTES UA: NEGATIVE
NITRITE: POSITIVE — AB
PROTEIN: NEGATIVE mg/dL
SPECIFIC GRAVITY, URINE: 1.011 (ref 1.005–1.030)
pH: 5 (ref 5.0–8.0)

## 2015-06-19 LAB — BASIC METABOLIC PANEL
Anion gap: 8 (ref 5–15)
BUN: 14 mg/dL (ref 6–20)
CHLORIDE: 101 mmol/L (ref 101–111)
CO2: 29 mmol/L (ref 22–32)
CREATININE: 0.56 mg/dL (ref 0.44–1.00)
Calcium: 9 mg/dL (ref 8.9–10.3)
GFR calc Af Amer: >60 mL/min (ref >60)
GFR calc non Af Amer: >60 mL/min (ref >60)
GLUCOSE: 132 mg/dL — AB (ref 65–99)
POTASSIUM: 3.8 mmol/L (ref 3.5–5.1)
SODIUM: 138 mmol/L (ref 135–145)

## 2015-06-19 LAB — LIPID PANEL
CHOL/HDL RATIO: 2.1 ratio
Cholesterol: 121 mg/dL (ref 0–200)
HDL: 57 mg/dL (ref >40)
LDL CALC: 57 mg/dL (ref 0–99)
TRIGLYCERIDES: 34 mg/dL (ref <150)
VLDL: 7 mg/dL (ref 0–40)

## 2015-06-19 LAB — CREATININE, SERUM: Creatinine, Ser: 0.51 mg/dL (ref 0.44–1.00)

## 2015-06-19 LAB — CBC
HEMATOCRIT: 40.5 % (ref 35.0–47.0)
HEMOGLOBIN: 13.5 g/dL (ref 12.0–16.0)
MCH: 30.1 pg (ref 26.0–34.0)
MCHC: 33.4 g/dL (ref 32.0–36.0)
MCV: 90.1 fL (ref 80.0–100.0)
Platelets: 156 10*3/uL (ref 150–440)
RBC: 4.49 MIL/uL (ref 3.80–5.20)
RDW: 13 % (ref 11.5–14.5)
WBC: 10.4 10*3/uL (ref 3.6–11.0)

## 2015-06-19 LAB — TROPONIN I
Troponin I: <0.03 ng/mL (ref <0.031)
Troponin I: <0.03 ng/mL (ref <0.031)

## 2015-06-19 LAB — BRAIN NATRIURETIC PEPTIDE: B NATRIURETIC PEPTIDE 5: 244 pg/mL — AB (ref 0.0–100.0)

## 2015-06-19 MED ORDER — ONDANSETRON HCL 4 MG/2ML IJ SOLN: 4.0000 mg | Freq: Four times a day (QID) | INTRAMUSCULAR | Status: DC | PRN

## 2015-06-19 MED ORDER — IOHEXOL 350 MG/ML SOLN
100.0000 mL | Freq: Once | INTRAVENOUS | Status: AC | PRN
  Administered 2015-06-19: 100 mL via INTRAVENOUS

## 2015-06-19 MED ORDER — ACETAMINOPHEN 325 MG PO TABS: 650.0000 mg | ORAL_TABLET | ORAL | Status: DC | PRN

## 2015-06-19 MED ORDER — METOPROLOL TARTRATE 25 MG PO TABS
12.5000 mg | ORAL_TABLET | Freq: Two times a day (BID) | ORAL | Status: DC
  Administered 2015-06-19: 25 mg via ORAL
  Filled 2015-06-19 (×2): qty 1

## 2015-06-19 MED ORDER — ENOXAPARIN SODIUM 40 MG/0.4ML ~~LOC~~ SOLN
40.0000 mg | Freq: Every day | SUBCUTANEOUS | Status: DC
  Administered 2015-06-19: 40 mg via SUBCUTANEOUS
  Filled 2015-06-19: qty 0.4

## 2015-06-19 MED ORDER — SODIUM CHLORIDE 0.9 % IV SOLN: 250.0000 mL | INTRAVENOUS | Status: DC | PRN

## 2015-06-19 MED ORDER — ATORVASTATIN CALCIUM 20 MG PO TABS
20.0000 mg | ORAL_TABLET | Freq: Every day | ORAL | Status: DC
  Administered 2015-06-19: 20 mg via ORAL
  Filled 2015-06-19: qty 1

## 2015-06-19 MED ORDER — ASPIRIN EC 81 MG PO TBEC
81.0000 mg | DELAYED_RELEASE_TABLET | Freq: Every day | ORAL | Status: DC
  Administered 2015-06-20: 81 mg via ORAL
  Filled 2015-06-19: qty 1

## 2015-06-19 MED ORDER — METOPROLOL TARTRATE 25 MG PO TABS
25.0000 mg | ORAL_TABLET | Freq: Two times a day (BID) | ORAL | Status: DC
  Administered 2015-06-19 – 2015-06-20 (×2): 25 mg via ORAL
  Filled 2015-06-19 (×2): qty 1

## 2015-06-19 MED ORDER — AZITHROMYCIN 250 MG PO TABS
500.0000 mg | ORAL_TABLET | Freq: Every day | ORAL | Status: AC
  Administered 2015-06-19: 500 mg via ORAL
  Filled 2015-06-19: qty 2

## 2015-06-19 MED ORDER — ASPIRIN 81 MG PO CHEW: 324.0000 mg | CHEWABLE_TABLET | ORAL | Status: AC

## 2015-06-19 MED ORDER — SODIUM CHLORIDE 0.9% FLUSH
3.0000 mL | Freq: Two times a day (BID) | INTRAVENOUS | Status: DC
  Administered 2015-06-19 – 2015-06-20 (×4): 3 mL via INTRAVENOUS

## 2015-06-19 MED ORDER — AZITHROMYCIN 250 MG PO TABS
250.0000 mg | ORAL_TABLET | Freq: Every day | ORAL | Status: DC
  Administered 2015-06-20: 250 mg via ORAL
  Filled 2015-06-19: qty 1

## 2015-06-19 MED ORDER — NITROGLYCERIN 2 % TD OINT
1.0000 [in_us] | TOPICAL_OINTMENT | Freq: Once | TRANSDERMAL | Status: AC
  Administered 2015-06-19: 1 [in_us] via TOPICAL
  Filled 2015-06-19: qty 1

## 2015-06-19 MED ORDER — NITROGLYCERIN 2 % TD OINT
1.0000 [in_us] | TOPICAL_OINTMENT | Freq: Four times a day (QID) | TRANSDERMAL | Status: DC
  Administered 2015-06-19: 1 [in_us] via TOPICAL
  Filled 2015-06-19: qty 1

## 2015-06-19 MED ORDER — METOPROLOL TARTRATE 25 MG PO TABS: 25.0000 mg | ORAL_TABLET | Freq: Two times a day (BID) | ORAL | Status: DC

## 2015-06-19 MED ORDER — ENOXAPARIN SODIUM 80 MG/0.8ML ~~LOC~~ SOLN
1.0000 mg/kg | Freq: Two times a day (BID) | SUBCUTANEOUS | Status: DC
  Administered 2015-06-19: 75 mg via SUBCUTANEOUS
  Filled 2015-06-19: qty 0.8

## 2015-06-19 MED ORDER — MORPHINE SULFATE (PF) 2 MG/ML IV SOLN
2.0000 mg | INTRAVENOUS | Status: DC | PRN
  Administered 2015-06-19 (×2): 2 mg via INTRAVENOUS
  Filled 2015-06-19 (×2): qty 1

## 2015-06-19 MED ORDER — SODIUM CHLORIDE 0.9% FLUSH: 3.0000 mL | INTRAVENOUS | Status: DC | PRN

## 2015-06-19 MED ORDER — ASPIRIN 300 MG RE SUPP: 300.0000 mg | RECTAL | Status: AC

## 2015-06-19 MED ORDER — NITROGLYCERIN 0.4 MG SL SUBL: 0.4000 mg | SUBLINGUAL_TABLET | SUBLINGUAL | Status: DC | PRN

## 2015-06-19 NOTE — ED Notes (Addendum)
Family is going to run some errands, I gave them room number 244.

## 2015-06-19 NOTE — Progress Notes (Signed)
RN notified by [facility] clerk that pt converted to afib. CV strip checked and confirmed by RN. MD notified. New orders given. Will continue to monitor.   Iran Sizer M

## 2015-06-19 NOTE — Care Management Obs Status (Signed)
[facility]   Patient Details  Name: Caroline Aguilar MRN: [id number] Date of Birth: May 16, 1925   Medicare Observation Status Notification Given:   (chest pain)    Ival Bible, RN 06/19/2015, 6:39 PM

## 2015-06-19 NOTE — Consult Note (Signed)
[facility] Card[facility]  CARDIOLOGY CONSULT NOTE  Patient ID: Caroline Aguilar MRN: [id number] DOB/AGE: Sep 13, 1925 82 y.o.  Admit date: 06/18/2015 Referring Physician Posey Pronto Primary Physician [facility] Primary Cardiologist  Reason for Consultation chest pain  HPI: 82 year old female for further evaluation of chest pain. The patient was in her usual state of health until yesterday, and she experienced discomfort in her neck and across her back, associated with sharp pain across to her chest. He presented to [facility] emergency room where ECG revealed sinus rhythm without acute ischemic ST-T wave changes. Admission labs were notable for negative troponin. Follow-up troponin was negative. Overall, the patient reports her symptoms have improved. She has residual chest discomfort, which is pleuritic in nature. Chest CT was performed which revealed no evidence for aortic dissection, or pulmonary emboli.  Review of systems complete and found to be negative unless listed above     Past Medical History  Diagnosis Date  . Angina pectoris ([place])   . Hyperlipidemia   . Cancer [facility])     Colon cancer    Past Surgical History  Procedure Laterality Date  . Hip surgery Bilateral   . Colon surgery    . Cataract extraction Bilateral     No prescriptions prior to admission   Social History   Social History  . Marital Status: Married    Spouse Name: N/A  . Number of Children: N/A  . Years of Education: N/A   Occupational History  . retired    Social History Main Topics  . Smoking status: Never Smoker   . Smokeless tobacco: Not on file  . Alcohol Use: No  . Drug Use: No  . Sexual Activity: Not on file   Other Topics Concern  . Not on file   Social History Narrative    Family History  Problem Relation Age of Onset  . Cancer Mother   . Heart failure Father       Review of systems complete and found to be negative unless listed above      PHYSICAL EXAM  General: Well developed, well nourished,  in no acute distress HEENT:  Normocephalic and atramatic Neck:  No JVD.  Lungs: Clear bilaterally to auscultation and percussion. Heart: HRRR . Normal S1 and S2 without gallops or murmurs.  Abdomen: Bowel sounds are positive, abdomen soft and non-tender  Msk:  Back normal, normal gait. Normal strength and tone for age. Extremities: No clubbing, cyanosis or edema.   Neuro: Alert and oriented X 3. Psych:  Good affect, responds appropriately  Labs:   Lab Results  Component Value Date   WBC 10.4 06/19/2015   HGB 13.5 06/19/2015   HCT 40.5 06/19/2015   MCV 90.1 06/19/2015   PLT 156 06/19/2015    Recent Labs Lab 06/19/15 0420  NA 138  K 3.8  CL 101  CO2 29  BUN 14  CREATININE 0.56  0.51  CALCIUM 9.0  GLUCOSE 132*   Lab Results  Component Value Date   TROPONINI <0.03 06/19/2015    Lab Results  Component Value Date   CHOL 121 06/19/2015   Lab Results  Component Value Date   HDL 57 06/19/2015   Lab Results  Component Value Date   LDLCALC 57 06/19/2015   Lab Results  Component Value Date   TRIG 34 06/19/2015   Lab Results  Component Value Date   CHOLHDL 2.1 06/19/2015   No results found for: LDLDIRECT    Radiology: Dg Chest 2 View  06/18/2015  CLINICAL DATA:  Acute onset of generalized chest pain, radiating to the neck and back. Initial encounter. EXAM: CHEST  2 VIEW COMPARISON:  Chest radiograph performed 07/26/2014 FINDINGS: The lungs are well-aerated and clear. There is no evidence of focal opacification, pleural effusion or pneumothorax. The heart is borderline enlarged. No acute osseous abnormalities are seen. Chronic compression deformities are noted along the lower thoracic and upper lumbar spine, with changes of vertebroplasty at the upper lumbar spine. IMPRESSION: Borderline cardiomegaly.  Lungs remain grossly clear. Electronically Signed   By: Garald Balding M.D.   On: 06/18/2015 23:04   Ct Angio Chest Pe W/cm &/or Wo Cm  06/19/2015  CLINICAL DATA:  Acute  onset of generalized chest pain, radiating to the neck and back. Initial encounter. EXAM: CT ANGIOGRAPHY CHEST WITH CONTRAST TECHNIQUE: Multidetector CT imaging of the chest was performed using the standard protocol during bolus administration of intravenous contrast. Multiplanar CT image reconstructions and MIPs were obtained to evaluate the vascular anatomy. CONTRAST:  123mL OMNIPAQUE IOHEXOL 350 MG/ML SOLN COMPARISON:  Chest radiograph performed 06/18/2015 FINDINGS: There is no evidence of aortic dissection. There is distention of the ascending thoracic aorta to 4.4 cm in maximal diameter, though this is within the normal range given the patient's age. Scattered calcification is noted along the aortic arch and descending thoracic aorta. Scattered calcification is noted at the aortic valve. There is no evidence of pulmonary embolus. Minimal bibasilar atelectasis is noted. Slight interstitial prominence is noted at the lung apices. There is no evidence of significant focal consolidation, pleural effusion or pneumothorax. No masses are identified; no abnormal focal contrast enhancement is seen. The heart is mildly enlarged. Trace pericardial fluid remains within normal limits. Visualized mediastinal nodes are grossly unremarkable. No pericardial effusion is identified. No axillary lymphadenopathy is seen. The visualized portions of the thyroid gland are unremarkable in appearance. The visualized portions of the liver and spleen are unremarkable. No acute osseous abnormalities are seen. Degenerative change is noted at the left glenohumeral joint. There is slight chronic loss of height at multiple levels along the thoracic spine. Review of the MIP images confirms the above findings. IMPRESSION: 1. No evidence of aortic dissection. Distention of the ascending thoracic aorta to 4.4 cm in maximal diameter, still within the normal range given the patient's age. 2. Mild cardiomegaly. Scattered calcification along the  aortic arch and descending thoracic aorta. Scattered calcification at the aortic valve. 3. Minimal bibasilar atelectasis noted. Slight interstitial prominence the lung apices. 4. Degenerative change at the left glenohumeral joint. Electronically Signed   By: Garald Balding M.D.   On: 06/19/2015 01:03    EKG: Normal sinus rhythm  ASSESSMENT AND PLAN:   1. Chest pain, with atypical features, pleuritic in nature, with normal ECG, negative troponin, symptoms gradually improving. Consider possible bronchitis versus early pneumonia.  Recommendations  1. Defer full dose anticoagulation 2. DC Nitropaste 3. Continue metoprolol 4. Consider therapy for acute bronchitis  Signed: Lillard Bailon MD,PhD, [facility] 06/19/2015, 9:26 AM

## 2015-06-19 NOTE — H&P (Signed)
[facility] NAME: Caroline Aguilar    MR#:  [id number]  DATE OF BIRTH:  1925/08/19  DATE OF ADMISSION:  06/18/2015  PRIMARY CARE PHYSICIAN: No primary care provider on file.   REQUESTING/REFERRING PHYSICIAN:   CHIEF COMPLAINT:   Chief Complaint  Patient presents with  . Chest Pain    HISTORY OF PRESENT ILLNESS: Caroline Aguilar  is a 82 y.o. female with a known history of hyperlipidemia, colon cancer, chronic angina presented to the emergency room with chest pain since yesterday afternoon. The pain is located all across the chest and the back. Pain is aching in nature 7 out of 10 on a scale of 1-10. The pain gets aggravated whenever she takes a deep breath. Patient was worked up with CT angiogram of the chest which showed no pulmonary embolism. First set of troponin was negative. No history of any shortness of breath. No history of any orthopnea or proximal nocturnal dyspnea. No history of any headache dizziness or blurry vision. No nausea vomiting or diaphoresis.  PAST MEDICAL HISTORY:   Past Medical History  Diagnosis Date  . Angina pectoris ([place])   . Hyperlipidemia   . Cancer [facility])     Colon cancer    PAST SURGICAL HISTORY: Past Surgical History  Procedure Laterality Date  . Hip surgery Bilateral   . Colon surgery    . Cataract extraction Bilateral     SOCIAL HISTORY:  Social History  Substance Use Topics  . Smoking status: Never Smoker   . Smokeless tobacco: Not on file  . Alcohol Use: No    FAMILY HISTORY:  Family History  Problem Relation Age of Onset  . Cancer Mother   . Heart failure Father     DRUG ALLERGIES:  Allergies  Allergen Reactions  . Penicillins Anaphylaxis    REVIEW OF SYSTEMS:   CONSTITUTIONAL: No fever, fatigue or weakness.  EYES: No blurred or double vision.  EARS, NOSE, AND THROAT: No tinnitus or ear pain.  RESPIRATORY: No cough, shortness of breath, wheezing or hemoptysis.   CARDIOVASCULAR: Has chest pain, no orthopnea, edema.  GASTROINTESTINAL: No nausea, vomiting, diarrhea or abdominal pain.  GENITOURINARY: No dysuria, hematuria.  ENDOCRINE: No polyuria, nocturia,  HEMATOLOGY: No anemia, easy bruising or bleeding SKIN: No rash or lesion. MUSCULOSKELETAL: No joint pain or arthritis.   NEUROLOGIC: No tingling, numbness, weakness.  PSYCHIATRY: No anxiety or depression.   MEDICATIONS AT HOME:  Prior to Admission medications   Not on File      PHYSICAL EXAMINATION:   VITAL SIGNS: Blood pressure 138/91, pulse 69, temperature 99.2 F (37.3 C), temperature source Oral, resp. rate 18, SpO2 100 %.  GENERAL:  82 y.o.-year-old patient lying in the bed with no acute distress.  EYES: Pupils equal, round, reactive to light and accommodation. No scleral icterus. Extraocular muscles intact.  HEENT: Head atraumatic, normocephalic. Oropharynx and nasopharynx clear.  NECK:  Supple, no jugular venous distention. No thyroid enlargement, no tenderness.  LUNGS: Normal breath sounds bilaterally, no wheezing, rales,rhonchi or crepitation. No use of accessory muscles of respiration.  CARDIOVASCULAR: S1, S2 normal. No murmurs, rubs, or gallops.  ABDOMEN: Soft, nontender, nondistended. Bowel sounds present. No organomegaly or mass.  EXTREMITIES: No pedal edema, cyanosis, or clubbing.  NEUROLOGIC: Cranial nerves II through XII are intact. Muscle strength 5/5 in all extremities. Sensation intact. Gait normal.  PSYCHIATRIC: [name].  SKIN: No obvious rash, lesion, or  ulcer.   LABORATORY PANEL:   CBC  Recent Labs Lab 06/18/15 2230  WBC 11.2*  HGB 13.5  HCT 40.6  PLT 158  MCV 89.8  MCH 29.9  MCHC 33.3  RDW 13.1   ------------------------------------------------------------------------------------------------------------------  Chemistries   Recent Labs Lab 06/18/15 2230  NA 136  K 4.1  CL 98*  CO2 33*  GLUCOSE 140*  BUN 16   CREATININE 0.62  CALCIUM 9.6   ------------------------------------------------------------------------------------------------------------------ CrCl cannot be calculated (Unknown ideal weight.). ------------------------------------------------------------------------------------------------------------------ No results for input(s): TSH, T4TOTAL, T3FREE, THYROIDAB in the last 72 hours.  Invalid input(s): FREET3   Coagulation profile No results for input(s): INR, PROTIME in the last 168 hours. ------------------------------------------------------------------------------------------------------------------- No results for input(s): DDIMER in the last 72 hours. -------------------------------------------------------------------------------------------------------------------  Cardiac Enzymes  Recent Labs Lab 06/18/15 2230  TROPONINI <0.03   ------------------------------------------------------------------------------------------------------------------ Invalid input(s): POCBNP  ---------------------------------------------------------------------------------------------------------------  Urinalysis    Component Value Date/Time   COLORURINE YELLOW* 06/19/2015 0011   APPEARANCEUR HAZY* 06/19/2015 0011   LABSPEC 1.011 06/19/2015 0011   PHURINE 5.0 06/19/2015 0011   GLUCOSEU NEGATIVE 06/19/2015 0011   HGBUR 3+* 06/19/2015 0011   BILIRUBINUR NEGATIVE 06/19/2015 0011   KETONESUR NEGATIVE 06/19/2015 0011   PROTEINUR NEGATIVE 06/19/2015 0011   NITRITE POSITIVE* 06/19/2015 0011   LEUKOCYTESUR NEGATIVE 06/19/2015 0011     RADIOLOGY: Dg Chest 2 View  06/18/2015  CLINICAL DATA:  Acute onset of generalized chest pain, radiating to the neck and back. Initial encounter. EXAM: CHEST  2 VIEW COMPARISON:  Chest radiograph performed 07/26/2014 FINDINGS: The lungs are well-aerated and clear. There is no evidence of focal opacification, pleural effusion or pneumothorax. The heart is  borderline enlarged. No acute osseous abnormalities are seen. Chronic compression deformities are noted along the lower thoracic and upper lumbar spine, with changes of vertebroplasty at the upper lumbar spine. IMPRESSION: Borderline cardiomegaly.  Lungs remain grossly clear. Electronically Signed   By: Garald Balding M.D.   On: 06/18/2015 23:04   Ct Angio Chest Pe W/cm &/or Wo Cm  06/19/2015  CLINICAL DATA:  Acute onset of generalized chest pain, radiating to the neck and back. Initial encounter. EXAM: CT ANGIOGRAPHY CHEST WITH CONTRAST TECHNIQUE: Multidetector CT imaging of the chest was performed using the standard protocol during bolus administration of intravenous contrast. Multiplanar CT image reconstructions and MIPs were obtained to evaluate the vascular anatomy. CONTRAST:  116mL OMNIPAQUE IOHEXOL 350 MG/ML SOLN COMPARISON:  Chest radiograph performed 06/18/2015 FINDINGS: There is no evidence of aortic dissection. There is distention of the ascending thoracic aorta to 4.4 cm in maximal diameter, though this is within the normal range given the patient's age. Scattered calcification is noted along the aortic arch and descending thoracic aorta. Scattered calcification is noted at the aortic valve. There is no evidence of pulmonary embolus. Minimal bibasilar atelectasis is noted. Slight interstitial prominence is noted at the lung apices. There is no evidence of significant focal consolidation, pleural effusion or pneumothorax. No masses are identified; no abnormal focal contrast enhancement is seen. The heart is mildly enlarged. Trace pericardial fluid remains within normal limits. Visualized mediastinal nodes are grossly unremarkable. No pericardial effusion is identified. No axillary lymphadenopathy is seen. The visualized portions of the thyroid gland are unremarkable in appearance. The visualized portions of the liver and spleen are unremarkable. No acute osseous abnormalities are seen. Degenerative  change is noted at the left glenohumeral joint. There is slight chronic loss of height at multiple levels along the thoracic spine. Review of the MIP  images confirms the above findings. IMPRESSION: 1. No evidence of aortic dissection. Distention of the ascending thoracic aorta to 4.4 cm in maximal diameter, still within the normal range given the patient's age. 2. Mild cardiomegaly. Scattered calcification along the aortic arch and descending thoracic aorta. Scattered calcification at the aortic valve. 3. Minimal bibasilar atelectasis noted. Slight interstitial prominence the lung apices. 4. Degenerative change at the left glenohumeral joint. Electronically Signed   By: Garald Balding M.D.   On: 06/19/2015 01:03    EKG: Orders placed or performed during the hospital encounter of 06/18/15  . EKG 12-Lead  . EKG 12-Lead  . ED EKG within 10 minutes  . ED EKG within 10 minutes    IMPRESSION AND PLAN: 82 year old female patient with history of hyperlipidemia, chronic angina presented to the emergency room with chest pain. Admitting diagnosis 1. Unstable angina 2. Hyperlipidemia 3. Ascending thoracic aortic aneurysm which is stable with no evidence of any dissection 4. History of colon cancer Treatment plan Admit patient to telemetry observation bed Aspirin 81 mg daily Anticoagulate patient with subcutaneous Lovenox 40 MG daily Cycle troponin check for ischemia and cardiology consult Start patient on metoprolol and oral Lipitor Check lipid panel Control chest pain with Nitropaste and morphine as needed Supportive care.  All the records are reviewed and case discussed with ED provider. Management plans discussed with the patient, family and they are in agreement.  CODE STATUS:FULL Code Status History    This patient does not have a recorded code status. Please follow your organizational policy for patients in this situation.    Advance Directive Documentation        Most Recent Value    Type of Advance Directive  Living will   Pre-existing out of facility DNR order (yellow form or pink MOST form)     "MOST" Form in Place?         TOTAL TIME TAKING CARE OF THIS PATIENT: 50 minutes.    Saundra Shelling M.D on 06/19/2015 at 2:02 AM  Between 7am to 6pm - Pager - [phone]  After 6pm go to [email or web address] - password [email or web address] [facility]  [place] [facility]  Office  [phone]  CC: Primary care physician; No primary care provider on file.

## 2015-06-19 NOTE — ED Notes (Signed)
Spoke with Murray Hodgkins from Lab about pt. Label malfunction. (epic will not let anyone print labels from any pt. Identifier. Urine sent with pt. Chart label, Lab approved and accepted per Murray Hodgkins

## 2015-06-19 NOTE — Progress Notes (Signed)
Patient is alert and oriented x 4, admitted with a diagnosis of chest pain. Patient oriented to the the staff, call bell/ascom. Tele box verified by 2 employees.  [facility] Neurosurgeon. Skin assessment done with Cassie RN, noted scattered bruises. No signs/symptoms pf distress noted. Will continue to monitor.

## 2015-06-19 NOTE — Progress Notes (Addendum)
Patient ID: Caroline Aguilar, female   DOB: [birth year], 82 y.o.   MRN: [id number] [place] [facility] [place] NAME: Caroline Aguilar    MR#:  [id number]  DATE OF BIRTH:  January 28, 1926  SUBJECTIVE:   Came in with chest pain on and off for a few days more so on breathing. Denies fever or cough congestion REVIEW OF SYSTEMS:   Review of Systems  Constitutional: Negative for fever, chills and weight loss.  HENT: Negative for ear discharge, ear pain and nosebleeds.   Eyes: Negative for blurred vision, pain and discharge.  Respiratory: Positive for shortness of breath. Negative for sputum production, wheezing and stridor.   Cardiovascular: Positive for chest pain. Negative for palpitations, orthopnea and PND.  Gastrointestinal: Negative for nausea, vomiting, abdominal pain and diarrhea.  Genitourinary: Negative for urgency and frequency.  Musculoskeletal: Negative for back pain and joint pain.  Neurological: Negative for sensory change, speech change, focal weakness and weakness.  Psychiatric/Behavioral: Negative for depression and hallucinations. The patient is not nervous/anxious.   All other systems reviewed and are negative.  Tolerating Diet: yes Tolerating PT: Not needed  DRUG ALLERGIES:   Allergies  Allergen Reactions  . Penicillins Anaphylaxis    VITALS:  Blood pressure 102/48, pulse 71, temperature 97.6 F (36.4 C), temperature source Oral, resp. rate 17, height 5\' 5"  (1.651 m), weight 74.118 kg (163 lb 6.4 oz), SpO2 93 %.  PHYSICAL EXAMINATION:   Physical Exam  GENERAL:  82 y.o.-year-old patient lying in the bed with no acute distress.  EYES: Pupils equal, round, reactive to light and accommodation. No scleral icterus. Extraocular muscles intact.  HEENT: Head atraumatic, normocephalic. Oropharynx and nasopharynx clear.  NECK:  Supple, no jugular venous distention. No thyroid enlargement, no tenderness.  LUNGS: Normal breath sounds  bilaterally, no wheezing, rales, rhonchi. No use of accessory muscles of respiration.  CARDIOVASCULAR: S1, S2 normal. No murmurs, rubs, or gallops.  ABDOMEN: Soft, nontender, nondistended. Bowel sounds present. No organomegaly or mass.  EXTREMITIES: No cyanosis, clubbing or edema b/l.    NEUROLOGIC: Cranial nerves II through XII are intact. No focal Motor or sensory deficits b/l.   PSYCHIATRIC:  patient is alert and oriented x 3.  SKIN: No obvious rash, lesion, or ulcer.   LABORATORY PANEL:  CBC  Recent Labs Lab 06/19/15 0420  WBC 10.4  HGB 13.5  HCT 40.5  PLT 156    Chemistries   Recent Labs Lab 06/19/15 0420  NA 138  K 3.8  CL 101  CO2 29  GLUCOSE 132*  BUN 14  CREATININE 0.56  0.51  CALCIUM 9.0   Cardiac Enzymes  Recent Labs Lab 06/19/15 0859  TROPONINI <0.03   RADIOLOGY:  Dg Chest 2 View  06/18/2015  CLINICAL DATA:  Acute onset of generalized chest pain, radiating to the neck and back. Initial encounter. EXAM: CHEST  2 VIEW COMPARISON:  Chest radiograph performed 07/26/2014 FINDINGS: The lungs are well-aerated and clear. There is no evidence of focal opacification, pleural effusion or pneumothorax. The heart is borderline enlarged. No acute osseous abnormalities are seen. Chronic compression deformities are noted along the lower thoracic and upper lumbar spine, with changes of vertebroplasty at the upper lumbar spine. IMPRESSION: Borderline cardiomegaly.  Lungs remain grossly clear. Electronically Signed   By: Garald Balding M.D.   On: 06/18/2015 23:04   Ct Angio Chest Pe W/cm &/or Wo Cm  06/19/2015  CLINICAL DATA:  Acute onset of generalized chest pain,  radiating to the neck and back. Initial encounter. EXAM: CT ANGIOGRAPHY CHEST WITH CONTRAST TECHNIQUE: Multidetector CT imaging of the chest was performed using the standard protocol during bolus administration of intravenous contrast. Multiplanar CT image reconstructions and MIPs were obtained to evaluate the  vascular anatomy. CONTRAST:  125mL OMNIPAQUE IOHEXOL 350 MG/ML SOLN COMPARISON:  Chest radiograph performed 06/18/2015 FINDINGS: There is no evidence of aortic dissection. There is distention of the ascending thoracic aorta to 4.4 cm in maximal diameter, though this is within the normal range given the patient's age. Scattered calcification is noted along the aortic arch and descending thoracic aorta. Scattered calcification is noted at the aortic valve. There is no evidence of pulmonary embolus. Minimal bibasilar atelectasis is noted. Slight interstitial prominence is noted at the lung apices. There is no evidence of significant focal consolidation, pleural effusion or pneumothorax. No masses are identified; no abnormal focal contrast enhancement is seen. The heart is mildly enlarged. Trace pericardial fluid remains within normal limits. Visualized mediastinal nodes are grossly unremarkable. No pericardial effusion is identified. No axillary lymphadenopathy is seen. The visualized portions of the thyroid gland are unremarkable in appearance. The visualized portions of the liver and spleen are unremarkable. No acute osseous abnormalities are seen. Degenerative change is noted at the left glenohumeral joint. There is slight chronic loss of height at multiple levels along the thoracic spine. Review of the MIP images confirms the above findings. IMPRESSION: 1. No evidence of aortic dissection. Distention of the ascending thoracic aorta to 4.4 cm in maximal diameter, still within the normal range given the patient's age. 2. Mild cardiomegaly. Scattered calcification along the aortic arch and descending thoracic aorta. Scattered calcification at the aortic valve. 3. Minimal bibasilar atelectasis noted. Slight interstitial prominence the lung apices. 4. Degenerative change at the left glenohumeral joint. Electronically Signed   By: Garald Balding M.D.   On: 06/19/2015 01:03   ASSESSMENT AND PLAN:  82 year old female  patient with history of hyperlipidemia, chronic angina presented to the emergency room with chest pain.  1. Chest pain worsen on respiration. -Remains in sinus rhythm but it can sometimes are negative. No further cardiac workup by Dr.parachos -Echo in the past showed mild ES. Chest Pain could be due to that. -No sign symptoms of acute bronchitis. Chest x-ray shows no evidence of bronchitis but Is normal patient doesn't have symptoms of cough. However if she persistently has pruritic chest pain we'll give her a course of Z-Pak.  2. Hyperlipidemia Continue statins 3. Ascending thoracic aortic aneurysm which is stable with no evidence of any dissection  4. History of colon cancer  Case discussed with Care Management/Social Worker. Management plans discussed with the patient, family and they are in agreement.  CODE STATUS: Full  DVT Prophylaxis: Lovenox  TOTAL TIME TAKING CARE OF THIS PATIENT: 30 minutes.  >50% time spent on counselling and coordination of care  POSSIBLE D/C IN one to 2 DAYS, DEPENDING ON CLINICAL CONDITION.  Note: This dictation was prepared with Dragon dictation along with smaller phrase technology. Any transcriptional errors that result from this process are unintentional.  Caroline Aguilar M.D on 06/19/2015 at 1:01 PM  Between 7am to 6pm - Pager - [phone]  After 6pm go to [email or web address] - password [email or web address] [facility]  [place] [facility]  Office  [phone]  CC: Primary care physician; No primary care provider on file.

## 2015-06-19 NOTE — ED Notes (Signed)
MD at bedside. 

## 2015-06-19 NOTE — Care Management Obs Status (Signed)
[place] NOTIFICATION   Patient Details  Name: Caroline Aguilar MRN: [id number] Date of Birth: 09-05-1925   Medicare Observation Status Notification Given:  Yes    Ival Bible, RN 06/19/2015, 6:40 PM

## 2015-06-20 ENCOUNTER — Other Ambulatory Visit: Payer: Commercial Managed Care - HMO

## 2015-06-20 DIAGNOSIS — I712 Thoracic aortic aneurysm, without rupture: Secondary | ICD-10-CM | POA: Diagnosis not present

## 2015-06-20 DIAGNOSIS — C187 Malignant neoplasm of sigmoid colon: Secondary | ICD-10-CM | POA: Diagnosis not present

## 2015-06-20 DIAGNOSIS — R071 Chest pain on breathing: Secondary | ICD-10-CM | POA: Diagnosis not present

## 2015-06-20 DIAGNOSIS — E782 Mixed hyperlipidemia: Secondary | ICD-10-CM | POA: Diagnosis not present

## 2015-06-20 DIAGNOSIS — R079 Chest pain, unspecified: Secondary | ICD-10-CM | POA: Diagnosis not present

## 2015-06-20 MED ORDER — AZITHROMYCIN 250 MG PO TABS: ORAL_TABLET | ORAL | Status: DC

## 2015-06-20 MED ORDER — ASPIRIN 81 MG PO TBEC: 81.0000 mg | DELAYED_RELEASE_TABLET | Freq: Every day | ORAL | Status: DC

## 2015-06-20 NOTE — Progress Notes (Signed)
Spoke with Dr. Posey Pronto about giving patient her metoprolol this AM due to hypotension. MD stated that since she takes it at home, go ahead and give it and to get the patient up and walk her some since she hasn't moved around much.

## 2015-06-20 NOTE — Progress Notes (Signed)
Discharge instructions given to patient and daughter. Prescription sent to pharmacy. IV and tele removed. CCMD notified of tele removal. Education given on afib, pleurisy, and how to take blood pressure. Patient instructed to get a blood pressure cuff at home and not to take her metoprolol if pressure is low. Will follow up with PCP. No questions at this time. Patient's son is on the way with her clothes now.

## 2015-06-20 NOTE — Discharge Summary (Signed)
[facility] [place] NAME: Tayven Leydon    MR#:  [id number]  DATE OF BIRTH:  1925-09-21  DATE OF ADMISSION:  06/18/2015 ADMITTING PHYSICIAN: Saundra Shelling, MD  DATE OF DISCHARGE: 06/20/15  PRIMARY CARE PHYSICIAN: No primary care provider on file.    ADMISSION DIAGNOSIS:  Unstable angina ([place]) [I20.0] Chest pain, unspecified chest pain type [R07.9]  DISCHARGE DIAGNOSIS:  Chest pain-Pleuritic  SECONDARY DIAGNOSIS:   Past Medical History  Diagnosis Date  . Angina pectoris ([place])   . Hyperlipidemia   . Cancer [facility])     Colon cancer    HOSPITAL COURSE:  82 year old female patient with history of hyperlipidemia, chronic angina presented to the emergency room with chest pain.  1. Chest pain worsen on respiration-pleuritic -Remains in sinus rhythm but it can sometimes are negative. No further cardiac workup by Dr.parachos -Echo in the past showed mild ES. Chest Pain could be due to that. -No sign symptoms of acute bronchitis. Chest x-ray shows no evidence of bronchitis but Is normal patient doesn't have symptoms of cough.empiric rx with po z-pak 2. Hyperlipidemia Continue statins 3. Ascending thoracic aortic aneurysm 4.4 cm which is stable with no evidence of any dissection  4. History of colon cancer Overall feels better we'll DC her to go home. Patient is agreeable she'll follow-up with Dr. Emily Filbert as outpatient in a week to 10 days CONSULTS OBTAINED:  Treatment Team:  Isaias Cowman, MD  DRUG ALLERGIES:   Allergies  Allergen Reactions  . Penicillins Anaphylaxis    DISCHARGE MEDICATIONS:   Current Discharge Medication List    START taking these medications   Details  aspirin EC 81 MG EC tablet Take 1 tablet (81 mg total) by mouth daily. Qty: 30 tablet, Refills: 0    azithromycin (ZITHROMAX) 250 MG tablet Take as directed Qty: 4 each, Refills: 0      CONTINUE these medications which have NOT CHANGED    Details  latanoprost (XALATAN) 0.005 % ophthalmic solution Place 1 drop into both eyes at bedtime.    !! metoprolol succinate (TOPROL-XL) 25 MG 24 hr tablet Take 25 mg by mouth every morning.     !! metoprolol succinate (TOPROL-XL) 25 MG 24 hr tablet Take 12.5 mg by mouth at bedtime.    simvastatin (ZOCOR) 20 MG tablet Take 20 mg by mouth at bedtime.    timolol (TIMOPTIC) 0.5 % ophthalmic solution Place 1 drop into both eyes 2 (two) times daily.     !! - Potential duplicate medications found. Please discuss with provider.      If you experience worsening of your admission symptoms, develop shortness of breath, life threatening emergency, suicidal or homicidal thoughts you must seek medical attention immediately by calling 911 or calling your MD immediately  if symptoms less severe.  You Must read complete instructions/literature along with all the possible adverse reactions/side effects for all the Medicines you take and that have been prescribed to you. Take any new Medicines after you have completely understood and accept all the possible adverse reactions/side effects.   Please note  You were cared for by a hospitalist during your hospital stay. If you have any questions about your discharge medications or the care you received while you were in the hospital after you are discharged, you can call the unit and asked to speak with the hospitalist on call if the hospitalist that took care of you is not available. Once you are discharged, your  primary care physician will handle any further medical issues. Please note that NO REFILLS for any discharge medications will be authorized once you are discharged, as it is imperative that you return to your primary care physician (or establish a relationship with a primary care physician if you do not have one) for your aftercare needs so that they can reassess your need for medications and monitor your lab values. Today   SUBJECTIVE   Feels  better. Mild chest discomfort with respiration  VITAL SIGNS:  Blood pressure 104/47, pulse 82, temperature 98.7 F (37.1 C), temperature source Oral, resp. rate 18, height 5\' 5"  (1.651 m), weight 74.118 kg (163 lb 6.4 oz), SpO2 93 %.  I/O:   Intake/Output Summary (Last 24 hours) at 06/20/15 0918 Last data filed at 06/20/15 0817  Gross per 24 hour  Intake    600 ml  Output    800 ml  Net   -200 ml    PHYSICAL EXAMINATION:  GENERAL:  82 y.o.-year-old patient lying in the bed with no acute distress.  EYES: Pupils equal, round, reactive to light and accommodation. No scleral icterus. Extraocular muscles intact.  HEENT: Head atraumatic, normocephalic. Oropharynx and nasopharynx clear.  NECK:  Supple, no jugular venous distention. No thyroid enlargement, no tenderness.  LUNGS: Normal breath sounds bilaterally, no wheezing, rales,rhonchi or crepitation. No use of accessory muscles of respiration.  CARDIOVASCULAR: S1, S2 normal. No murmurs, rubs, or gallops.  ABDOMEN: Soft, non-tender, non-distended. Bowel sounds present. No organomegaly or mass.  EXTREMITIES: No pedal edema, cyanosis, or clubbing.  NEUROLOGIC: Cranial nerves II through XII are intact. Muscle strength 5/5 in all extremities. Sensation intact. Gait not checked.  PSYCHIATRIC: [name].  SKIN: No obvious rash, lesion, or ulcer.   DATA REVIEW:   CBC   Recent Labs Lab 06/19/15 0420  WBC 10.4  HGB 13.5  HCT 40.5  PLT 156    Chemistries   Recent Labs Lab 06/19/15 0420  NA 138  K 3.8  CL 101  CO2 29  GLUCOSE 132*  BUN 14  CREATININE 0.56  0.51  CALCIUM 9.0    Microbiology Results   No results found for this or any previous visit (from the past 240 hour(s)).  RADIOLOGY:  Dg Chest 2 View  06/18/2015  CLINICAL DATA:  Acute onset of generalized chest pain, radiating to the neck and back. Initial encounter. EXAM: CHEST  2 VIEW COMPARISON:  Chest radiograph performed 07/26/2014  FINDINGS: The lungs are well-aerated and clear. There is no evidence of focal opacification, pleural effusion or pneumothorax. The heart is borderline enlarged. No acute osseous abnormalities are seen. Chronic compression deformities are noted along the lower thoracic and upper lumbar spine, with changes of vertebroplasty at the upper lumbar spine. IMPRESSION: Borderline cardiomegaly.  Lungs remain grossly clear. Electronically Signed   By: Garald Balding M.D.   On: 06/18/2015 23:04   Ct Angio Chest Pe W/cm &/or Wo Cm  06/19/2015  CLINICAL DATA:  Acute onset of generalized chest pain, radiating to the neck and back. Initial encounter. EXAM: CT ANGIOGRAPHY CHEST WITH CONTRAST TECHNIQUE: Multidetector CT imaging of the chest was performed using the standard protocol during bolus administration of intravenous contrast. Multiplanar CT image reconstructions and MIPs were obtained to evaluate the vascular anatomy. CONTRAST:  133mL OMNIPAQUE IOHEXOL 350 MG/ML SOLN COMPARISON:  Chest radiograph performed 06/18/2015 FINDINGS: There is no evidence of aortic dissection. There is distention of the ascending thoracic aorta to  4.4 cm in maximal diameter, though this is within the normal range given the patient's age. Scattered calcification is noted along the aortic arch and descending thoracic aorta. Scattered calcification is noted at the aortic valve. There is no evidence of pulmonary embolus. Minimal bibasilar atelectasis is noted. Slight interstitial prominence is noted at the lung apices. There is no evidence of significant focal consolidation, pleural effusion or pneumothorax. No masses are identified; no abnormal focal contrast enhancement is seen. The heart is mildly enlarged. Trace pericardial fluid remains within normal limits. Visualized mediastinal nodes are grossly unremarkable. No pericardial effusion is identified. No axillary lymphadenopathy is seen. The visualized portions of the thyroid gland are unremarkable  in appearance. The visualized portions of the liver and spleen are unremarkable. No acute osseous abnormalities are seen. Degenerative change is noted at the left glenohumeral joint. There is slight chronic loss of height at multiple levels along the thoracic spine. Review of the MIP images confirms the above findings. IMPRESSION: 1. No evidence of aortic dissection. Distention of the ascending thoracic aorta to 4.4 cm in maximal diameter, still within the normal range given the patient's age. 2. Mild cardiomegaly. Scattered calcification along the aortic arch and descending thoracic aorta. Scattered calcification at the aortic valve. 3. Minimal bibasilar atelectasis noted. Slight interstitial prominence the lung apices. 4. Degenerative change at the left glenohumeral joint. Electronically Signed   By: Garald Balding M.D.   On: 06/19/2015 01:03     Management plans discussed with the patient, family and they are in agreement.  CODE STATUS:     Code Status Orders        Start     Ordered   06/19/15 0316  Full code   Continuous     06/19/15 0315    Code Status History    Date Active Date Inactive Code Status Order ID Comments User Context   This patient has a current code status but no historical code status.    Advance Directive Documentation        Most Recent Value   Type of Advance Directive  Living will   Pre-existing out of facility DNR order (yellow form or pink MOST form)     "MOST" Form in Place?        TOTAL TIME TAKING CARE OF THIS PATIENT:  minutes.    Konstantine Gervasi M.D on 06/20/2015 at 9:18 AM  Between 7am to 6pm - Pager - [phone] After 6pm go to [email or web address] - password [email or web address] [facility]  [facility]  Office  [phone]  CC: Primary care physician; No primary care provider on file.

## 2015-06-20 NOTE — Progress Notes (Signed)
RN notified by [facility] clerk that pt has converted back into NSR. RN confirmed with tele strip. Will continue to monitor.    Caroline Aguilar

## 2015-06-20 NOTE — Progress Notes (Signed)
Notified Dr. Saralyn Pilar of patient's blood pressure running a little low in the 100's over 50's. MD stated to take off nitro paste since it doesn't seem to be helping patients chest pain. Also notified MD that metoprolol was held this AM. Acknowledged.

## 2015-06-22 DIAGNOSIS — R079 Chest pain, unspecified: Secondary | ICD-10-CM | POA: Diagnosis not present

## 2015-06-22 DIAGNOSIS — I350 Nonrheumatic aortic (valve) stenosis: Secondary | ICD-10-CM | POA: Diagnosis not present

## 2015-06-22 DIAGNOSIS — R0609 Other forms of dyspnea: Secondary | ICD-10-CM | POA: Diagnosis not present

## 2015-07-02 ENCOUNTER — Encounter: Payer: Self-pay | Admitting: Emergency Medicine

## 2015-07-02 ENCOUNTER — Emergency Department: Payer: Commercial Managed Care - HMO

## 2015-07-02 ENCOUNTER — Inpatient Hospital Stay
Admission: EM | Admit: 2015-07-02 | Discharge: 2015-07-09 | DRG: 308 | Disposition: A | Discharge status: Home Health | Payer: Commercial Managed Care - HMO | Attending: Internal Medicine | Admitting: Internal Medicine

## 2015-07-02 DIAGNOSIS — Z85038 Personal history of other malignant neoplasm of large intestine: Secondary | ICD-10-CM

## 2015-07-02 DIAGNOSIS — R0689 Other abnormalities of breathing: Secondary | ICD-10-CM | POA: Diagnosis present

## 2015-07-02 DIAGNOSIS — Z880 Allergy status to penicillin: Secondary | ICD-10-CM

## 2015-07-02 DIAGNOSIS — K210 Gastro-esophageal reflux disease with esophagitis: Secondary | ICD-10-CM | POA: Diagnosis present

## 2015-07-02 DIAGNOSIS — R5383 Other fatigue: Secondary | ICD-10-CM | POA: Diagnosis not present

## 2015-07-02 DIAGNOSIS — J90 Pleural effusion, not elsewhere classified: Secondary | ICD-10-CM

## 2015-07-02 DIAGNOSIS — Z7982 Long term (current) use of aspirin: Secondary | ICD-10-CM

## 2015-07-02 DIAGNOSIS — K3189 Other diseases of stomach and duodenum: Secondary | ICD-10-CM | POA: Diagnosis not present

## 2015-07-02 DIAGNOSIS — I509 Heart failure, unspecified: Secondary | ICD-10-CM | POA: Diagnosis not present

## 2015-07-02 DIAGNOSIS — I4891 Unspecified atrial fibrillation: Secondary | ICD-10-CM | POA: Diagnosis not present

## 2015-07-02 DIAGNOSIS — G629 Polyneuropathy, unspecified: Secondary | ICD-10-CM | POA: Diagnosis not present

## 2015-07-02 DIAGNOSIS — I200 Unstable angina: Secondary | ICD-10-CM | POA: Diagnosis not present

## 2015-07-02 DIAGNOSIS — I350 Nonrheumatic aortic (valve) stenosis: Secondary | ICD-10-CM | POA: Diagnosis not present

## 2015-07-02 DIAGNOSIS — N390 Urinary tract infection, site not specified: Secondary | ICD-10-CM | POA: Diagnosis present

## 2015-07-02 DIAGNOSIS — R1310 Dysphagia, unspecified: Secondary | ICD-10-CM | POA: Diagnosis present

## 2015-07-02 DIAGNOSIS — E222 Syndrome of inappropriate secretion of antidiuretic hormone: Secondary | ICD-10-CM | POA: Diagnosis not present

## 2015-07-02 DIAGNOSIS — Z9889 Other specified postprocedural states: Secondary | ICD-10-CM

## 2015-07-02 DIAGNOSIS — I10 Essential (primary) hypertension: Secondary | ICD-10-CM | POA: Diagnosis present

## 2015-07-02 DIAGNOSIS — E871 Hypo-osmolality and hyponatremia: Secondary | ICD-10-CM | POA: Diagnosis not present

## 2015-07-02 DIAGNOSIS — B9620 Unspecified Escherichia coli [E. coli] as the cause of diseases classified elsewhere: Secondary | ICD-10-CM | POA: Diagnosis present

## 2015-07-02 DIAGNOSIS — I5033 Acute on chronic diastolic (congestive) heart failure: Secondary | ICD-10-CM | POA: Diagnosis not present

## 2015-07-02 DIAGNOSIS — K222 Esophageal obstruction: Secondary | ICD-10-CM | POA: Diagnosis present

## 2015-07-02 DIAGNOSIS — R5381 Other malaise: Secondary | ICD-10-CM | POA: Diagnosis not present

## 2015-07-02 DIAGNOSIS — Z79899 Other long term (current) drug therapy: Secondary | ICD-10-CM | POA: Diagnosis not present

## 2015-07-02 DIAGNOSIS — F329 Major depressive disorder, single episode, unspecified: Secondary | ICD-10-CM | POA: Diagnosis present

## 2015-07-02 DIAGNOSIS — J189 Pneumonia, unspecified organism: Secondary | ICD-10-CM | POA: Diagnosis present

## 2015-07-02 DIAGNOSIS — R0602 Shortness of breath: Secondary | ICD-10-CM | POA: Diagnosis not present

## 2015-07-02 DIAGNOSIS — Z881 Allergy status to other antibiotic agents status: Secondary | ICD-10-CM

## 2015-07-02 DIAGNOSIS — H409 Unspecified glaucoma: Secondary | ICD-10-CM | POA: Diagnosis present

## 2015-07-02 DIAGNOSIS — R0609 Other forms of dyspnea: Secondary | ICD-10-CM | POA: Diagnosis not present

## 2015-07-02 DIAGNOSIS — R0789 Other chest pain: Secondary | ICD-10-CM | POA: Diagnosis not present

## 2015-07-02 DIAGNOSIS — I208 Other forms of angina pectoris: Secondary | ICD-10-CM

## 2015-07-02 DIAGNOSIS — K209 Esophagitis, unspecified: Secondary | ICD-10-CM | POA: Diagnosis not present

## 2015-07-02 DIAGNOSIS — K219 Gastro-esophageal reflux disease without esophagitis: Secondary | ICD-10-CM | POA: Diagnosis present

## 2015-07-02 DIAGNOSIS — I480 Paroxysmal atrial fibrillation: Principal | ICD-10-CM | POA: Diagnosis present

## 2015-07-02 DIAGNOSIS — I2089 Other forms of angina pectoris: Secondary | ICD-10-CM

## 2015-07-02 DIAGNOSIS — E785 Hyperlipidemia, unspecified: Secondary | ICD-10-CM | POA: Diagnosis present

## 2015-07-02 DIAGNOSIS — I6521 Occlusion and stenosis of right carotid artery: Secondary | ICD-10-CM | POA: Diagnosis present

## 2015-07-02 LAB — URINALYSIS COMPLETE WITH MICROSCOPIC (ARMC ONLY)
BILIRUBIN URINE: NEGATIVE
Glucose, UA: NEGATIVE mg/dL
KETONES UR: NEGATIVE mg/dL
NITRITE: POSITIVE — AB
PROTEIN: 30 mg/dL — AB
Specific Gravity, Urine: 1.02 (ref 1.005–1.030)
pH: 5 (ref 5.0–8.0)

## 2015-07-02 LAB — BASIC METABOLIC PANEL
ANION GAP: 11 (ref 5–15)
BUN: 19 mg/dL (ref 6–20)
CO2: 26 mmol/L (ref 22–32)
Calcium: 8.1 mg/dL — ABNORMAL LOW (ref 8.9–10.3)
Chloride: 86 mmol/L — ABNORMAL LOW (ref 101–111)
Creatinine, Ser: 0.69 mg/dL (ref 0.44–1.00)
GFR calc Af Amer: >60 mL/min (ref >60)
Glucose, Bld: 148 mg/dL — ABNORMAL HIGH (ref 65–99)
POTASSIUM: 3.8 mmol/L (ref 3.5–5.1)
SODIUM: 123 mmol/L — AB (ref 135–145)

## 2015-07-02 LAB — CBC WITH DIFFERENTIAL/PLATELET
Basophils Absolute: 0 10*3/uL (ref 0–0.1)
Basophils Relative: 0 %
Eosinophils Absolute: 0 10*3/uL (ref 0–0.7)
Eosinophils Relative: 0 %
HCT: 35.2 % (ref 35.0–47.0)
Hemoglobin: 11.7 g/dL — ABNORMAL LOW (ref 12.0–16.0)
Lymphocytes Relative: 8 %
Lymphs Abs: 1.1 10*3/uL (ref 1.0–3.6)
MCH: 29.2 pg (ref 26.0–34.0)
MCHC: 33.2 g/dL (ref 32.0–36.0)
MCV: 87.8 fL (ref 80.0–100.0)
Monocytes Absolute: 1.3 10*3/uL — ABNORMAL HIGH (ref 0.2–0.9)
Monocytes Relative: 10 %
Neutro Abs: 11.3 10*3/uL — ABNORMAL HIGH (ref 1.4–6.5)
Neutrophils Relative %: 82 %
Platelets: 383 10*3/uL (ref 150–440)
RBC: 4 MIL/uL (ref 3.80–5.20)
RDW: 12.4 % (ref 11.5–14.5)
WBC: 13.7 10*3/uL — ABNORMAL HIGH (ref 3.6–11.0)

## 2015-07-02 LAB — OSMOLALITY, URINE: Osmolality, Ur: 700 mOsm/kg (ref 300–900)

## 2015-07-02 LAB — SODIUM, URINE, RANDOM: SODIUM UR: 24 mmol/L

## 2015-07-02 LAB — LACTIC ACID, PLASMA
Lactic Acid, Venous: 1.1 mmol/L (ref 0.5–2.0)
Lactic Acid, Venous: 1 mmol/L (ref 0.5–2.0)

## 2015-07-02 LAB — PROCALCITONIN: PROCALCITONIN: 0.14 ng/mL

## 2015-07-02 MED ORDER — DILTIAZEM HCL 25 MG/5ML IV SOLN
15.0000 mg | Freq: Once | INTRAVENOUS | Status: AC
  Administered 2015-07-02: 15 mg via INTRAVENOUS
  Filled 2015-07-02: qty 5

## 2015-07-02 MED ORDER — SERTRALINE HCL 50 MG PO TABS
50.0000 mg | ORAL_TABLET | Freq: Every day | ORAL | Status: DC
  Administered 2015-07-02 – 2015-07-07 (×4): 50 mg via ORAL
  Filled 2015-07-02 (×5): qty 1

## 2015-07-02 MED ORDER — MORPHINE SULFATE (PF) 2 MG/ML IV SOLN
2.0000 mg | INTRAVENOUS | Status: DC | PRN
Start: 2015-07-02 — End: 2015-07-09
  Administered 2015-07-05: 2 mg via INTRAVENOUS
  Filled 2015-07-02: qty 1

## 2015-07-02 MED ORDER — ACETAMINOPHEN 325 MG PO TABS
650.0000 mg | ORAL_TABLET | Freq: Four times a day (QID) | ORAL | Status: DC | PRN
  Administered 2015-07-05: 650 mg via ORAL
  Filled 2015-07-02: qty 2

## 2015-07-02 MED ORDER — SODIUM CHLORIDE 0.9 % IV BOLUS (SEPSIS)
1000.0000 mL | Freq: Once | INTRAVENOUS | Status: AC
  Administered 2015-07-02: 1000 mL via INTRAVENOUS

## 2015-07-02 MED ORDER — TIMOLOL MALEATE 0.5 % OP SOLN
1.0000 [drp] | Freq: Two times a day (BID) | OPHTHALMIC | Status: DC
  Administered 2015-07-02 – 2015-07-09 (×14): 1 [drp] via OPHTHALMIC
  Filled 2015-07-02: qty 5

## 2015-07-02 MED ORDER — ACETAMINOPHEN 650 MG RE SUPP: 650.0000 mg | Freq: Four times a day (QID) | RECTAL | Status: DC | PRN

## 2015-07-02 MED ORDER — CALCIUM CARBONATE-VITAMIN D 500-200 MG-UNIT PO TABS
1.0000 | ORAL_TABLET | Freq: Every day | ORAL | Status: DC
  Administered 2015-07-03 – 2015-07-09 (×6): 1 via ORAL
  Filled 2015-07-02 (×12): qty 1

## 2015-07-02 MED ORDER — ASPIRIN EC 81 MG PO TBEC
81.0000 mg | DELAYED_RELEASE_TABLET | Freq: Every day | ORAL | Status: DC
  Administered 2015-07-02 – 2015-07-03 (×2): 81 mg via ORAL
  Filled 2015-07-02 (×2): qty 1

## 2015-07-02 MED ORDER — LEVOFLOXACIN IN D5W 500 MG/100ML IV SOLN
500.0000 mg | INTRAVENOUS | Status: DC
  Administered 2015-07-02 – 2015-07-04 (×3): 500 mg via INTRAVENOUS
  Filled 2015-07-02 (×4): qty 100

## 2015-07-02 MED ORDER — DOCUSATE SODIUM 100 MG PO CAPS
100.0000 mg | ORAL_CAPSULE | Freq: Every day | ORAL | Status: DC
  Administered 2015-07-03 – 2015-07-09 (×7): 100 mg via ORAL
  Filled 2015-07-02 (×8): qty 1

## 2015-07-02 MED ORDER — CIPROFLOXACIN HCL 500 MG PO TABS: 500.0000 mg | ORAL_TABLET | Freq: Two times a day (BID) | ORAL | Status: DC

## 2015-07-02 MED ORDER — OXYCODONE HCL 5 MG PO TABS
5.0000 mg | ORAL_TABLET | ORAL | Status: DC | PRN
Start: 2015-07-02 — End: 2015-07-09

## 2015-07-02 MED ORDER — PANTOPRAZOLE SODIUM 40 MG PO TBEC
40.0000 mg | DELAYED_RELEASE_TABLET | Freq: Every day | ORAL | Status: DC
  Administered 2015-07-03 – 2015-07-09 (×7): 40 mg via ORAL
  Filled 2015-07-02 (×8): qty 1

## 2015-07-02 MED ORDER — SIMVASTATIN 20 MG PO TABS
20.0000 mg | ORAL_TABLET | Freq: Every day | ORAL | Status: DC
  Administered 2015-07-02 – 2015-07-08 (×7): 20 mg via ORAL
  Filled 2015-07-02 (×7): qty 1

## 2015-07-02 MED ORDER — ENOXAPARIN SODIUM 40 MG/0.4ML ~~LOC~~ SOLN
40.0000 mg | SUBCUTANEOUS | Status: DC
  Administered 2015-07-02: 40 mg via SUBCUTANEOUS
  Filled 2015-07-02 (×2): qty 0.4

## 2015-07-02 MED ORDER — LATANOPROST 0.005 % OP SOLN
1.0000 [drp] | Freq: Every day | OPHTHALMIC | Status: DC
  Administered 2015-07-02 – 2015-07-08 (×6): 1 [drp] via OPHTHALMIC
  Filled 2015-07-02: qty 2.5

## 2015-07-02 MED ORDER — METOPROLOL SUCCINATE ER 25 MG PO TB24: 12.5000 mg | ORAL_TABLET | Freq: Two times a day (BID) | ORAL | Status: DC

## 2015-07-02 MED ORDER — METOPROLOL SUCCINATE ER 25 MG PO TB24
25.0000 mg | ORAL_TABLET | Freq: Every morning | ORAL | Status: DC
  Administered 2015-07-03 – 2015-07-09 (×7): 25 mg via ORAL
  Filled 2015-07-02 (×8): qty 1

## 2015-07-02 MED ORDER — ONDANSETRON HCL 4 MG PO TABS: 4.0000 mg | ORAL_TABLET | Freq: Four times a day (QID) | ORAL | Status: DC | PRN

## 2015-07-02 MED ORDER — METOPROLOL SUCCINATE ER 25 MG PO TB24
12.5000 mg | ORAL_TABLET | Freq: Every day | ORAL | Status: DC
  Administered 2015-07-02 – 2015-07-08 (×7): 12.5 mg via ORAL
  Filled 2015-07-02 (×7): qty 1

## 2015-07-02 MED ORDER — ONDANSETRON HCL 4 MG/2ML IJ SOLN: 4.0000 mg | Freq: Four times a day (QID) | INTRAMUSCULAR | Status: DC | PRN

## 2015-07-02 MED ORDER — LEVOFLOXACIN IN D5W 750 MG/150ML IV SOLN: 750.0000 mg | Freq: Once | INTRAVENOUS | Status: DC

## 2015-07-02 NOTE — H&P (Signed)
[facility] [place] NAME: Caroline Aguilar    MR#:  [id number]  DATE OF BIRTH:  1926/02/04   DATE OF ADMISSION:  07/02/2015  PRIMARY CARE PHYSICIAN: [name], MD   REQUESTING/REFERRING PHYSICIAN: Archie Balboa  CHIEF COMPLAINT:   Chief Complaint  Patient presents with  . Weakness  . Shortness of Breath    HISTORY OF PRESENT ILLNESS:  Caroline Aguilar  is a 82 y.o. female with a known history of Essential hypertension unspecified presenting with shortness of breath. She states she's had progressively worsening shortness of breath for approximately one week total duration mainly dyspnea on exertion. Has associated nonproductive cough denies fevers or chills. Denies any chest pain or palpitations. Scant lower extremity edema, positive orthopnea which is also been progressive. Given worsening symptoms and severity to supplement the Hospital further workup and evaluation. On arrival to the emergency department noted to be atrial fibrillation rapid ventricular response heart rate 140s received Cardizem IV with improvement but still tachycardic.  PAST MEDICAL HISTORY:   Past Medical History  Diagnosis Date  . Angina pectoris ([place])   . Hyperlipidemia   . Cancer [facility])     Colon cancer    PAST SURGICAL HISTORY:   Past Surgical History  Procedure Laterality Date  . Hip surgery Bilateral   . Colon surgery    . Cataract extraction Bilateral     SOCIAL HISTORY:   Social History  Substance Use Topics  . Smoking status: Never Smoker   . Smokeless tobacco: Not on file  . Alcohol Use: No    FAMILY HISTORY:   Family History  Problem Relation Age of Onset  . Cancer Mother   . Heart failure Father     DRUG ALLERGIES:   Allergies  Allergen Reactions  . Penicillins Anaphylaxis and Other (See Comments)    Has patient had a PCN reaction causing immediate rash, facial/tongue/throat swelling, SOB or lightheadedness with  hypotension: Yes Has patient had a PCN reaction causing severe rash involving mucus membranes or skin necrosis: No Has patient had a PCN reaction that required hospitalization No Has patient had a PCN reaction occurring within the last 10 years: No If all of the above answers are "NO", then may proceed with Cephalosporin use.  . Azithromycin Other (See Comments)    Reaction:  Unknown   . Famciclovir Other (See Comments)    Reaction:  Unknown     REVIEW OF SYSTEMS:  REVIEW OF SYSTEMS:  CONSTITUTIONAL: Denies fevers, chills, fatigue, weakness.  EYES: Denies blurred vision, double vision, or eye pain.  EARS, NOSE, THROAT: Denies tinnitus, ear pain, hearing loss.  RESPIRATORY: Positive cough, shortness of breath, denies wheezing  CARDIOVASCULAR: Denies chest pain, palpitations, positive edema.  GASTROINTESTINAL: Denies nausea, vomiting, diarrhea, abdominal pain.  GENITOURINARY: Denies dysuria, hematuria.  ENDOCRINE: Denies nocturia or thyroid problems. HEMATOLOGIC AND LYMPHATIC: Denies easy bruising or bleeding.  SKIN: Denies rash or lesions.  MUSCULOSKELETAL: Denies pain in neck, back, shoulder, knees, hips, or further arthritic symptoms.  NEUROLOGIC: Denies paralysis, paresthesias.  PSYCHIATRIC: Denies anxiety or depressive symptoms. Otherwise full review of systems performed by me is negative.   MEDICATIONS AT HOME:   Prior to Admission medications   Medication Sig Start Date End Date Taking? Authorizing Provider  aspirin EC 81 MG tablet Take 81 mg by mouth at bedtime.   Yes Historical Provider, MD  Calcium 600-200 MG-UNIT tablet Take 1 tablet by mouth daily.   Yes Historical  Provider, MD  docusate sodium (COLACE) 100 MG capsule Take 100 mg by mouth daily.   Yes Historical Provider, MD  latanoprost (XALATAN) 0.005 % ophthalmic solution Place 1 drop into both eyes at bedtime.   Yes Historical Provider, MD  metoprolol succinate (TOPROL-XL) 25 MG 24 hr tablet Take 12.5-25 mg by mouth  2 (two) times daily. Pt takes one tablet in the morning and one-half tablet at bedtime.   Yes Historical Provider, MD  pantoprazole (PROTONIX) 40 MG tablet Take 40 mg by mouth daily.   Yes Historical Provider, MD  sertraline (ZOLOFT) 50 MG tablet Take 50 mg by mouth at bedtime.   Yes Historical Provider, MD  simvastatin (ZOCOR) 20 MG tablet Take 20 mg by mouth at bedtime.   Yes Historical Provider, MD  timolol (TIMOPTIC) 0.5 % ophthalmic solution Place 1 drop into both eyes 2 (two) times daily.   Yes Historical Provider, MD  triamterene-hydrochlorothiazide (DYAZIDE) 37.5-25 MG capsule Take 1 capsule by mouth daily.   Yes Historical Provider, MD      VITAL SIGNS:  Blood pressure 153/71, pulse 115, temperature 98 F (36.7 C), temperature source Oral, resp. rate 22, height 5\' 5"  (1.651 m), weight 167 lb (75.751 kg), SpO2 97 %.  PHYSICAL EXAMINATION:  VITAL SIGNS: Filed Vitals:   07/02/15 1701 07/02/15 1827  BP:  153/71  Pulse: 39 115  Temp:    Resp: 21 85   GENERAL:82 y.o.female currently in Moderate acute distress. Given heart rate and respiratory status  HEAD: Normocephalic, atraumatic.  EYES: Pupils equal, round, reactive to light. Extraocular muscles intact. No scleral icterus.  MOUTH: Moist mucosal membrane. Dentition intact. No abscess noted.  EAR, NOSE, THROAT: Clear without exudates. No external lesions.  NECK: Supple. No thyromegaly. No nodules. No JVD.  PULMONARY: Coarse rhonchi at bases, without wheeze No use of accessory muscles, Good respiratory effort. good air entry bilaterally CHEST: Nontender to palpation.  CARDIOVASCULAR: S1 and S2. Irregular rate and irregular rhythm. 3/6 systolic ejection murmurs, rubs, or gallops. Trace lower extremity edema. Pedal pulses 2+ bilaterally.  GASTROINTESTINAL: Soft, nontender, nondistended. No masses. Positive bowel sounds. No hepatosplenomegaly.  MUSCULOSKELETAL: No swelling, clubbing, or edema. Range of motion full in all extremities.   NEUROLOGIC: Cranial nerves II through XII are intact. No gross focal neurological deficits. Sensation intact. Reflexes intact.  SKIN: No ulceration, lesions, rashes, or cyanosis. Skin warm and dry. Turgor intact.  PSYCHIATRIC: Mood, affect within normal limits. The patient is awake, alert and oriented x 3. Insight, judgment intact.    LABORATORY PANEL:   CBC  Recent Labs Lab 07/02/15 1613  WBC 13.7*  HGB 11.7*  HCT 35.2  PLT 383   ------------------------------------------------------------------------------------------------------------------  Chemistries   Recent Labs Lab 07/02/15 1613  NA 123*  K 3.8  CL 86*  CO2 26  GLUCOSE 148*  BUN 19  CREATININE 0.69  CALCIUM 8.1*   ------------------------------------------------------------------------------------------------------------------  Cardiac Enzymes No results for input(s): TROPONINI in the last 168 hours. ------------------------------------------------------------------------------------------------------------------  RADIOLOGY:  Dg Chest 2 View  07/02/2015  CLINICAL DATA:  Chest pain for 2 weeks EXAM: CHEST  2 VIEW COMPARISON:  None. FINDINGS: Cardiac shadow is enlarged. Aortic calcifications are noted. A small right-sided pleural effusion is seen. Large left-sided pleural effusion and likely underlying infiltrate is present. This is new from the prior exam. No acute bony abnormality is seen. IMPRESSION: Bilateral pleural effusions left greater than right. There is likely underlying left basilar infiltrate present as well. Electronically Signed   By: Elta Gu[place]  Lukens M.D.   On: 07/02/2015 18:21    EKG:   Orders placed or performed during the hospital encounter of 07/02/15  . EKG 12-Lead  . EKG 12-Lead    IMPRESSION AND PLAN:   82 year old Caucasian female history of hyperlipidemia unspecified presenting with shortness of breath) atrial fibrillation.  1. Atrial fibrillation with rapid ventricular response:  Received IV Cardizem heart rate currently in the 110s if remains elevated will require Cardizem drip. [place] score 1 secondary to age, echocardiogram performed on last admission reveals moderate atrial stenosis upper limit of size but without frank dilatation. Will consult cardiology 2. Acute respiratory insufficiency: Supplemental oxygen as required patient has noted pleural effusions which is likely secondary to the above however cannot completely rule out community acquired pneumonia. Given symptoms will start antibiotic coverage for community acquired pneumonia. Check pro calcitonin if within normal limits can discontinue antibiotics 3. Hyponatremia: High suspicion of SIADH given pulmonary etiology check urine sodium and urine osmolality, also the fact pleural effusions would avoid fluid resuscitation at this time continue to follow sodium level if labs return and SH is not present we'll provide gentle IV fluid hydration 4. Essential hypertension: Metoprolol 5. GERD without esophagitis PPI therapy  6. Hyperlipidemia unspecified Zocor 7. Venous thromboembolism prophylactic: Lovenox   All the records are reviewed and case discussed with ED provider. Management plans discussed with the patient, family and they are in agreement.  CODE STATUS: Full  TOTAL TIME TAKING CARE OF THIS PATIENT: 55 critical care minutes.    Rulon Abdalla,  Karenann Cai.D on 07/02/2015 at 7:27 PM  Between 7am to 6pm - Pager - [phone]  After 6pm: House Pager: - [facility]  Office  [phone]  CC: Primary care physician; [name], MD

## 2015-07-02 NOTE — ED Provider Notes (Signed)
[facility] Emergency Department Provider Note    ____________________________________________  Time seen: ~1635  I have reviewed the triage vital signs and the nursing notes.   HISTORY  Chief Complaint Weakness and Shortness of Breath   History limited by: Not Limited   HPI Caroline Aguilar is a 82 y.o. female who presents to the emergency department today because of concerns for worsening shortness of breath and weakness. The patient did have a recent hospitalization was discharged roughly 12 days ago. She states that since that time she has had some weakness and shortness of breath. She states however that the past 2 days and has become more severe. It is worse at night. She states she is having a hard time lying flat.She has had some central chest pain and burning Nadara Mustard does state that she has a problem with her sopping [place]. No fevers and she has been home from the hospital.     Past Medical History  Diagnosis Date  . Angina pectoris ([place])   . Hyperlipidemia   . Cancer [facility])     Colon cancer    Patient Active Problem List   Diagnosis Date Noted  . Unstable angina ([place]) 06/19/2015    Past Surgical History  Procedure Laterality Date  . Hip surgery Bilateral   . Colon surgery    . Cataract extraction Bilateral     Current Outpatient Rx  Name  Route  Sig  Dispense  Refill  . aspirin EC 81 MG EC tablet   Oral   Take 1 tablet (81 mg total) by mouth daily.   30 tablet   0   . azithromycin (ZITHROMAX) 250 MG tablet      Take as directed   4 each   0   . latanoprost (XALATAN) 0.005 % ophthalmic solution   Both Eyes   Place 1 drop into both eyes at bedtime.         . metoprolol succinate (TOPROL-XL) 25 MG 24 hr tablet   Oral   Take 25 mg by mouth every morning.          . metoprolol succinate (TOPROL-XL) 25 MG 24 hr tablet   Oral   Take 12.5 mg by mouth at bedtime.         . simvastatin (ZOCOR) 20 MG tablet   Oral  Take 20 mg by mouth at bedtime.         . timolol (TIMOPTIC) 0.5 % ophthalmic solution   Both Eyes   Place 1 drop into both eyes 2 (two) times daily.           Allergies Penicillins  Family History  Problem Relation Age of Onset  . Cancer Mother   . Heart failure Father     Social History Social History  Substance Use Topics  . Smoking status: Never Smoker   . Smokeless tobacco: None  . Alcohol Use: No    Review of Systems  Constitutional: Negative for fever. Cardiovascular: Positive for central chest. Respiratory: Positive for shortness of breath Gastrointestinal: Negative for abdominal pain, vomiting and diarrhea. Neurological: Negative for headaches, focal weakness or numbness.   10-point ROS otherwise negative.  ____________________________________________   PHYSICAL EXAM:  VITAL SIGNS: ED Triage Vitals  Enc Vitals Group     BP 07/02/15 1556 110/57 mmHg     Pulse Rate 07/02/15 1550 125     Resp 07/02/15 1550 22     Temp 07/02/15 1550 98 F (36.7 C)  Temp Source 07/02/15 1550 Oral     SpO2 07/02/15 1550 98 %     Weight 07/02/15 1550 167 lb (75.751 kg)     Height 07/02/15 1550 5\' 5"  (1.651 m)     Head Cir --      Peak Flow --      Pain Score 07/02/15 1554 0   Constitutional: Alert and oriented. Well appearing and in no distress. Eyes: Conjunctivae are normal. PERRL. Normal extraocular movements. ENT   Head: Normocephalic and atraumatic.   Nose: No congestion/rhinnorhea.   Mouth/Throat: Mucous membranes are moist.   Neck: No stridor. Hematological/Lymphatic/Immunilogical: No cervical lymphadenopathy. Cardiovascular: Irregularly irregular rhythm, tachycardic.  No murmurs, rubs, or gallops. Respiratory: Normal respiratory effort without tachypnea nor retractions. Breath sounds are clear and equal bilaterally. No wheezes/rales/rhonchi. Gastrointestinal: Soft and nontender. No distention.  Genitourinary: Deferred Musculoskeletal:  Normal range of motion in all extremities. No joint effusions.  No lower extremity tenderness nor edema. Neurologic:  Normal speech and language. No gross focal neurologic deficits are appreciated.  Skin:  Skin is warm, dry and intact. No rash noted. Psychiatric: Mood and affect are normal. Speech and behavior are normal. Patient exhibits appropriate insight and judgment.  ____________________________________________    LABS (pertinent positives/negatives)  Labs Reviewed  CBC WITH DIFFERENTIAL/PLATELET - Abnormal; Notable for the following:    WBC 13.7 (*)    Hemoglobin 11.7 (*)    Neutro Abs 11.3 (*)    Monocytes Absolute 1.3 (*)    All other components within normal limits  BASIC METABOLIC PANEL - Abnormal; Notable for the following:    Sodium 123 (*)    Chloride 86 (*)    Glucose, Bld 148 (*)    Calcium 8.1 (*)    All other components within normal limits  URINALYSIS COMPLETEWITH MICROSCOPIC ([facility] ONLY) - Abnormal; Notable for the following:    Color, Urine AMBER (*)    APPearance HAZY (*)    Hgb urine dipstick 3+ (*)    Protein, ur 30 (*)    Nitrite POSITIVE (*)    Leukocytes, UA 1+ (*)    Bacteria, UA RARE (*)    Squamous Epithelial / LPF 0-5 (*)    All other components within normal limits  CULTURE, BLOOD (ROUTINE X 2)  CULTURE, BLOOD (ROUTINE X 2)  URINE CULTURE  LACTIC ACID, PLASMA  LACTIC ACID, PLASMA  SODIUM, URINE, RANDOM  OSMOLALITY, URINE  CBC  BASIC METABOLIC PANEL  PROCALCITONIN  CREATININE, SERUM     ____________________________________________   EKG  I, Nance Pear, attending physician, personally viewed and interpreted this EKG  EKG Time: 1553 Rate: 141 Rhythm: atrial fibrillation with RVR Axis: normal Intervals: qtc 461 QRS: narrow, q waves V1, V2 ST changes: no st elevation Impression: abnormal ekg   ____________________________________________    RADIOLOGY  CXR  IMPRESSION: Bilateral pleural effusions left greater than  right. There is likely underlying left basilar infiltrate present as well.  ____________________________________________   PROCEDURES  Procedure(s) performed: None  Critical Care performed: No  ____________________________________________   INITIAL IMPRESSION / ASSESSMENT AND PLAN / ED COURSE  Pertinent labs & imaging results that were available during my care of the patient were reviewed by me and considered in my medical decision making (see chart for details).  She presented to the emergency department today with increased weakness and shortness of breath. Blood work was notable for hyponatremia. In addition chest x-ray shows bilateral pleural effusions and possible left basilar infiltrate. Given the comminution of  hyponatremia and concern for pleural effusions and pneumonia patient will be admitted to the hospital service for further workup and evaluation.  ____________________________________________   FINAL CLINICAL IMPRESSION(S) / ED DIAGNOSES  Final diagnoses:  Hyponatremia  Shortness of breath  Pleural effusion     Nance Pear, MD 07/02/15 2018

## 2015-07-02 NOTE — ED Notes (Signed)
Patient presents to the ED with increased shortness of breath and weakness.  Patient was admitted to the hospital on March 4th.  Patient states she has felt short of breath since then but worse over the last several days, unable to lie down to go to sleep.

## 2015-07-03 LAB — BASIC METABOLIC PANEL
ANION GAP: 8 (ref 5–15)
BUN: 18 mg/dL (ref 6–20)
CALCIUM: 8.3 mg/dL — AB (ref 8.9–10.3)
CO2: 27 mmol/L (ref 22–32)
Chloride: 91 mmol/L — ABNORMAL LOW (ref 101–111)
Creatinine, Ser: 0.58 mg/dL (ref 0.44–1.00)
GFR calc Af Amer: >60 mL/min (ref >60)
Glucose, Bld: 132 mg/dL — ABNORMAL HIGH (ref 65–99)
POTASSIUM: 3.9 mmol/L (ref 3.5–5.1)
SODIUM: 126 mmol/L — AB (ref 135–145)

## 2015-07-03 LAB — CBC
HCT: 34.8 % — ABNORMAL LOW (ref 35.0–47.0)
Hemoglobin: 11.8 g/dL — ABNORMAL LOW (ref 12.0–16.0)
MCH: 30.2 pg (ref 26.0–34.0)
MCHC: 33.8 g/dL (ref 32.0–36.0)
MCV: 89.2 fL (ref 80.0–100.0)
PLATELETS: 382 10*3/uL (ref 150–440)
RBC: 3.9 MIL/uL (ref 3.80–5.20)
RDW: 12.3 % (ref 11.5–14.5)
WBC: 11.4 10*3/uL — AB (ref 3.6–11.0)

## 2015-07-03 MED ORDER — AMIODARONE HCL IN DEXTROSE 360-4.14 MG/200ML-% IV SOLN
30.0000 mg/h | INTRAVENOUS | Status: DC
  Administered 2015-07-03 – 2015-07-04 (×2): 30 mg/h via INTRAVENOUS
  Filled 2015-07-03 (×5): qty 200

## 2015-07-03 MED ORDER — ENOXAPARIN SODIUM 80 MG/0.8ML ~~LOC~~ SOLN
1.0000 mg/kg | Freq: Two times a day (BID) | SUBCUTANEOUS | Status: DC
  Administered 2015-07-03 (×2): 80 mg via SUBCUTANEOUS
  Filled 2015-07-03 (×2): qty 0.8

## 2015-07-03 MED ORDER — AMIODARONE HCL IN DEXTROSE 360-4.14 MG/200ML-% IV SOLN
60.0000 mg/h | INTRAVENOUS | Status: DC
  Administered 2015-07-03 (×2): 60 mg/h via INTRAVENOUS
  Filled 2015-07-03: qty 200

## 2015-07-03 MED ORDER — SIMETHICONE 80 MG PO CHEW
80.0000 mg | CHEWABLE_TABLET | Freq: Four times a day (QID) | ORAL | Status: DC | PRN
Start: 2015-07-03 — End: 2015-07-09
  Administered 2015-07-03: 80 mg via ORAL
  Filled 2015-07-03 (×2): qty 1

## 2015-07-03 MED ORDER — AMIODARONE LOAD VIA INFUSION
150.0000 mg | Freq: Once | INTRAVENOUS | Status: AC
  Administered 2015-07-03: 150 mg via INTRAVENOUS
  Filled 2015-07-03: qty 83.34

## 2015-07-03 MED ORDER — IPRATROPIUM-ALBUTEROL 0.5-2.5 (3) MG/3ML IN SOLN
3.0000 mL | Freq: Once | RESPIRATORY_TRACT | Status: AC
  Administered 2015-07-03: 3 mL via RESPIRATORY_TRACT
  Filled 2015-07-03: qty 3

## 2015-07-03 NOTE — Progress Notes (Signed)
PT Cancellation Note  Patient Details Name: Caroline Aguilar MRN: [id number] DOB: July 14, 1925   Cancelled Treatment:    Reason Eval/Treat Not Completed: Medical issues which prohibited therapy. Patient is currently on amiodarone drip to control elevated HR. Discussed case with OT who was just in to see patient, OT reports she was labored in breathing and elevated HR even with minimal activity. Thus PT will defer mobility evaluation until patient is more appropriate for strenuous activity.    Kerman Passey, PT, DPT    07/03/2015, 10:16 AM

## 2015-07-03 NOTE — Progress Notes (Signed)
[facility] [place] NAME: Caroline Aguilar    MR#:  [id number]  DATE OF BIRTH:  Jul 09, 1925  SUBJECTIVE:  CHIEF COMPLAINT:   Chief Complaint  Patient presents with  . Weakness  . Shortness of Breath   - Remains in A. fib. Paroxysmal A. fib history diagnosed last week. Rate is better controlled. Remains on amiodarone drip. -Scheduled for Myoview on Monday. Complains of significant shortness of breath.  REVIEW OF SYSTEMS:  Review of Systems  Constitutional: Positive for malaise/fatigue. Negative for fever and chills.  HENT: Negative for ear discharge, ear pain and nosebleeds.   Eyes: Negative for blurred vision and double vision.  Respiratory: Positive for shortness of breath. Negative for cough and wheezing.   Cardiovascular: Positive for palpitations. Negative for chest pain and leg swelling.  Gastrointestinal: Negative for nausea, vomiting, abdominal pain, diarrhea and constipation.  Genitourinary: Negative for dysuria.  Musculoskeletal: Negative for myalgias.  Neurological: Negative for dizziness, tremors, sensory change, speech change, focal weakness, seizures and headaches.  Psychiatric/Behavioral: Negative for depression.    DRUG ALLERGIES:   Allergies  Allergen Reactions  . Penicillins Anaphylaxis and Other (See Comments)    Has patient had a PCN reaction causing immediate rash, facial/tongue/throat swelling, SOB or lightheadedness with hypotension: Yes Has patient had a PCN reaction causing severe rash involving mucus membranes or skin necrosis: No Has patient had a PCN reaction that required hospitalization No Has patient had a PCN reaction occurring within the last 10 years: No If all of the above answers are "NO", then may proceed with Cephalosporin use.  . Azithromycin Other (See Comments)    Reaction:  Unknown   . Famciclovir Other (See Comments)    Reaction:  Unknown     VITALS:  Blood pressure 106/60, pulse  73, temperature 97.7 F (36.5 C), temperature source Oral, resp. rate 18, height 5\' 5"  (1.651 m), weight 77.656 kg (171 lb 3.2 oz), SpO2 95 %.  PHYSICAL EXAMINATION:  Physical Exam  GENERAL:  82 y.o.-year-old patient lying in the bed with no acute distress.  EYES: Pupils equal, round, reactive to light and accommodation. No scleral icterus. Extraocular muscles intact.  HEENT: Head atraumatic, normocephalic. Oropharynx and nasopharynx clear.  NECK:  Supple, no jugular venous distention. No thyroid enlargement, no tenderness.  LUNGS: Coarse rhonchi and crackles noted posteriorly more prominent on the left side. No use of accessory muscles of respiration.  CARDIOVASCULAR: S1, S2 normal. No rubs, or gallops. 3/6 systolic murmur is present ABDOMEN: Soft, nontender, nondistended. Bowel sounds present. No organomegaly or mass.  EXTREMITIES: No pedal edema, cyanosis, or clubbing.  NEUROLOGIC: Cranial nerves II through XII are intact. Muscle strength 5/5 in all extremities. Sensation intact. Gait not checked.  PSYCHIATRIC: [name].  SKIN: No obvious rash, lesion, or ulcer.    LABORATORY PANEL:   CBC  Recent Labs Lab 07/03/15 0516  WBC 11.4*  HGB 11.8*  HCT 34.8*  PLT 382   ------------------------------------------------------------------------------------------------------------------  Chemistries   Recent Labs Lab 07/03/15 0516  NA 126*  K 3.9  CL 91*  CO2 27  GLUCOSE 132*  BUN 18  CREATININE 0.58  CALCIUM 8.3*   ------------------------------------------------------------------------------------------------------------------  Cardiac Enzymes No results for input(s): TROPONINI in the last 168 hours. ------------------------------------------------------------------------------------------------------------------  RADIOLOGY:  Dg Chest 2 View  07/02/2015  CLINICAL DATA:  Chest pain for 2 weeks EXAM: CHEST  2 VIEW COMPARISON:  None. FINDINGS:  Cardiac shadow is  enlarged. Aortic calcifications are noted. A small right-sided pleural effusion is seen. Large left-sided pleural effusion and likely underlying infiltrate is present. This is new from the prior exam. No acute bony abnormality is seen. IMPRESSION: Bilateral pleural effusions left greater than right. There is likely underlying left basilar infiltrate present as well. Electronically Signed   By: Inez Catalina M.D.   On: 07/02/2015 18:21    EKG:   Orders placed or performed during the hospital encounter of 07/02/15  . EKG 12-Lead  . EKG 12-Lead    ASSESSMENT AND PLAN:   86 y/oF with PMH of HTN, Hyperlipidemia who is independent at baseline presents to the hospital secondary to worsening dyspnea Denies any chest pain or palpitations. Noted to be in A. fib with RVR.  #1 Atrial fibrillation with rvr- rate better controlled - on amiodarone drip, also on oral metoprolol -Add Cardizem if needed to. -Echocardiogram just done last week showing normal ejection fraction. Minimal mitral stenosis. -Change Lovenox to therapeutic dose for A. fib. Will need anticoagulation prior to discharge.  #2 dyspnea on exertion-worsening over the last month. CT angiogram done last week did not show any pulmonary embolism. Normal lung fields and minimal aspect to sepsis. -Echo is normal too. Will need a Myoview to rule out cardiac causes of dyspnea. -lexiscan scheduled for Monday  #3 GERD-on Protonix  #4 depression-on Zoloft  #5 DVT prophylaxis-on Lovenox   All the records are reviewed and case discussed with Care Management/Social Workerr. Management plans discussed with the patient, family and they are in agreement.  CODE STATUS: Full Code  TOTAL TIME TAKING CARE OF THIS PATIENT: 37 minutes.   POSSIBLE D/C IN 2-3 DAYS, DEPENDING ON CLINICAL CONDITION.   Gladstone Lighter M.D on 07/03/2015 at 1:42 PM  Between 7am to 6pm - Pager - [phone]  After 6pm go to [email or web address] - password  [facility]  Office  [phone]  CC: Primary care physician; [name], MD

## 2015-07-03 NOTE — Progress Notes (Signed)
Pt tolerating amiodarone  gtt well. Iv resited in right hand. Rate currently 16.66ml/hr. She remains in at. Fib. In 90's. Vs q 4hrs.

## 2015-07-03 NOTE — Progress Notes (Signed)
Pt. C/o belching/burping, per daughter in law she has been doing it for the past 4-5 days.  Dr. Melynda Ripple paged,  new order for simethicone. Will continue to monitor pt.

## 2015-07-03 NOTE — Progress Notes (Signed)
ANTICOAGULATION CONSULT NOTE - Initial Consult  Pharmacy Consult for Enoxaparin Indication: atrial fibrillation  Allergies  Allergen Reactions  . Penicillins Anaphylaxis and Other (See Comments)    Has patient had a PCN reaction causing immediate rash, facial/tongue/throat swelling, SOB or lightheadedness with hypotension: Yes Has patient had a PCN reaction causing severe rash involving mucus membranes or skin necrosis: No Has patient had a PCN reaction that required hospitalization No Has patient had a PCN reaction occurring within the last 10 years: No If all of the above answers are "NO", then may proceed with Cephalosporin use.  . Azithromycin Other (See Comments)    Reaction:  Unknown   . Famciclovir Other (See Comments)    Reaction:  Unknown     Patient Measurements: Height: 5\' 5"  (165.1 cm) Weight: 171 lb 3.2 oz (77.656 kg) IBW/kg (Calculated) : 57 Heparin Dosing Weight: na  Vital Signs: Temp: 97.7 F (36.5 C) (03/18 0800) Temp Source: Oral (03/18 0800) BP: 106/60 mmHg (03/18 1147) Pulse Rate: 73 (03/18 0800)  Labs:  Recent Labs  07/02/15 1613 07/03/15 0516  HGB 11.7* 11.8*  HCT 35.2 34.8*  PLT 383 382  CREATININE 0.69 0.58    Estimated Creatinine Clearance: 51.1 mL/min (by C-G formula based on Cr of 0.58).   Medical History: Past Medical History  Diagnosis Date  . Angina pectoris ([place])   . Hyperlipidemia   . Cancer [facility]     Colon cancer    Medications:  Scheduled:  . aspirin EC  81 mg Oral QHS  . calcium-vitamin D  1 tablet Oral Daily  . docusate sodium  100 mg Oral Daily  . enoxaparin (LOVENOX) injection  1 mg/kg Subcutaneous Q12H  . latanoprost  1 drop Both Eyes QHS  . levofloxacin (LEVAQUIN) IV  500 mg Intravenous Q24H  . metoprolol succinate  12.5 mg Oral QHS  . metoprolol succinate  25 mg Oral q morning - 10a  . pantoprazole  40 mg Oral Daily  . sertraline  50 mg Oral QHS  . simvastatin  20 mg Oral QHS  . timolol  1 drop Both Eyes BID     Assessment: Patient is a 82yo female with new onset afib. Pharmacy consulted for enoxaparin dosing.  Goal of Therapy:   Monitor platelets by anticoagulation protocol: Yes   Plan:  Lovenox 1mg /kg (80mg ) SQ q12h.  Paulina Fusi, PharmD, BCPS 07/03/2015 1:56 PM

## 2015-07-03 NOTE — Progress Notes (Signed)
OT Cancellation Note  Patient Details Name: [name] MRN: [id number] DOB: 31-Aug-1925   Cancelled Treatment:    Reason Eval/Treat Not Completed: Patient not medically ready  Pt's in A-fib - and sitting up in bed - not feeling good- pt also receiving Amiodarone IV  SPoke with pt and daughter that OT will hold off this date    Rosalyn Gess OTR/L,CLT  07/03/2015, 1:47 PM

## 2015-07-03 NOTE — Progress Notes (Signed)
Pt. Arrived to the floor via stretcher. She walked from stretcher to bed. Tele applied. A-fib, central tele called, box verified by 2nd person Estill Bamberg, CNA. General room orientation given. Skin assessment completed with Baldo Ash, RN. Pt A&O, family at bedside, Instruction given on ascom and call bell system. Fall risk signage signed and placed on wall. Bed alarm on.  No c/o pain at this time. Pt. Given a sandwich box and water. Will continue to monitor pt.

## 2015-07-03 NOTE — Progress Notes (Signed)
Initial Nutrition Assessment  INTERVENTION:   Meals and Snacks: Cater to patient preferences  NUTRITION DIAGNOSIS:   No nutrition diagnosis at this time  GOAL:   Patient will meet greater than or equal to 90% of their needs  MONITOR:   PO intake, Labs, Weight trends  REASON FOR ASSESSMENT:   Consult COPD Protocol  ASSESSMENT:    Pt admitted with weakness, sob; pt with afib with RVR, acute respiratory insufficiency with pleural effusions, possible pneumonia  Past Medical History  Diagnosis Date  . Angina pectoris ([place])   . Hyperlipidemia   . Cancer [facility])     Colon cancer     Diet Order:  Diet regular Room service appropriate?: Yes; Fluid consistency:: Thin   Energy Intake: appetite good, ate 75% at breakfast this AM   Recent Labs Lab 07/02/15 1613 07/03/15 0516  NA 123* 126*  K 3.8 3.9  CL 86* 91*  CO2 26 27  BUN 19 18  CREATININE 0.69 0.58  CALCIUM 8.1* 8.3*  GLUCOSE 148* 132*    Glucose Profile: No results for input(s): GLUCAP in the last 72 hours. Meds: reviewed Height:   Ht Readings from Last 1 Encounters:  07/02/15 5\' 5"  (1.651 m)    Weight: no recent wt loss  Wt Readings from Last 1 Encounters:  07/02/15 171 lb 3.2 oz (77.656 kg)   Wt Readings from Last 10 Encounters:  07/02/15 171 lb 3.2 oz (77.656 kg)  06/19/15 163 lb 6.4 oz (74.118 kg)    BMI:  Body mass index is 28.49 kg/(m^2).  EDUCATION NEEDS:   No education needs identified at this time  [place], [place], LDN [phone] Pager  [phone] Weekend/On-Call Pager

## 2015-07-03 NOTE — Consult Note (Signed)
Reason for Consult: Atrial fibrillation chest pain shortness of breath Referring Physician: Dr. Valentino Aguilar hospitalist, Dr. Emily Aguilar primary Cardiologist [name] is an 82 y.o. female.  HPI: Patient was recently admitted 2 weeks ago with similar presentation of atrial fibrillation chest pain shortness of breath patient Caroline Aguilar was paroxysmal and resolved relatively quickly she was ruled out for myocardial infarction discharge home for further evaluation and done reasonably well. Patient started having worsening dyspnea chest discomfort radiating to her shoulders and back was found to be in rapid atrial fibrillation sustained also hyponatremia since she was admitted for further evaluation and care. Patient has a history of angina but this particular episode was different more intense with radiation to her back and shoulders. Patient denies any significant cardiac workup for angina in the past no functional study or cardiac catheterization. Denies myocardial infarction PCI status post coronary bypass. Patient still has chest discomfort dyspnea and significant fatigue minimal leg edema.  Past Medical History  Diagnosis Date  . Angina pectoris ([place])   . Hyperlipidemia   . Cancer [facility])     Colon cancer    Past Surgical History  Procedure Laterality Date  . Hip surgery Bilateral   . Colon surgery    . Cataract extraction Bilateral     Family History  Problem Relation Age of Onset  . Cancer Mother   . Heart failure Father     Social History:  reports that she has never smoked. She does not have any smokeless tobacco history on file. She reports that she does not drink alcohol or use illicit drugs.  Allergies:  Allergies  Allergen Reactions  . Penicillins Anaphylaxis and Other (See Comments)    Has patient had a PCN reaction causing immediate rash, facial/tongue/throat swelling, SOB or lightheadedness with hypotension: Yes Has patient had a PCN reaction causing  severe rash involving mucus membranes or skin necrosis: No Has patient had a PCN reaction that required hospitalization No Has patient had a PCN reaction occurring within the last 10 years: No If all of the above answers are "NO", then may proceed with Cephalosporin use.  . Azithromycin Other (See Comments)    Reaction:  Unknown   . Famciclovir Other (See Comments)    Reaction:  Unknown     Medications: I have reviewed the patient's current medications.  Results for orders placed or performed during the hospital encounter of 07/02/15 (from the past 48 hour(s))  CBC with Differential     Status: Abnormal   Collection Time: 07/02/15  4:13 PM  Result Value Ref Range   WBC 13.7 (H) 3.6 - 11.0 K/uL   RBC 4.00 3.80 - 5.20 MIL/uL   Hemoglobin 11.7 (L) 12.0 - 16.0 g/dL   HCT 35.2 35.0 - 47.0 %   MCV 87.8 80.0 - 100.0 fL   MCH 29.2 26.0 - 34.0 pg   MCHC 33.2 32.0 - 36.0 g/dL   RDW 12.4 11.5 - 14.5 %   Platelets 383 150 - 440 K/uL   Neutrophils Relative % 82 %   Neutro Abs 11.3 (H) 1.4 - 6.5 K/uL   Lymphocytes Relative 8 %   Lymphs Abs 1.1 1.0 - 3.6 K/uL   Monocytes Relative 10 %   Monocytes Absolute 1.3 (H) 0.2 - 0.9 K/uL   Eosinophils Relative 0 %   Eosinophils Absolute 0.0 0 - 0.7 K/uL   Basophils Relative 0 %   Basophils Absolute 0.0 0 - 0.1 K/uL  Basic  metabolic panel     Status: Abnormal   Collection Time: 07/02/15  4:13 PM  Result Value Ref Range   Sodium 123 (L) 135 - 145 mmol/L   Potassium 3.8 3.5 - 5.1 mmol/L   Chloride 86 (L) 101 - 111 mmol/L   CO2 26 22 - 32 mmol/L   Glucose, Bld 148 (H) 65 - 99 mg/dL   BUN 19 6 - 20 mg/dL   Creatinine, Ser 0.69 0.44 - 1.00 mg/dL   Calcium 8.1 (L) 8.9 - 10.3 mg/dL   GFR calc non Af Amer >60 >60 mL/min   GFR calc Af Amer >60 >60 mL/min    Comment: (NOTE) The eGFR has been calculated using the CKD EPI equation. This calculation has not been validated in all clinical situations. eGFR's persistently <60 mL/min signify possible Chronic  Kidney Disease.    Anion gap 11 5 - 15  Blood culture (routine x 2)     Status: None (Preliminary result)   Collection Time: 07/02/15  4:13 PM  Result Value Ref Range   Specimen Description BLOOD RT AC    Special Requests BOTTLES DRAWN AEROBIC AND ANAEROBIC 5CC    Culture NO GROWTH < 24 HOURS    Report Status PENDING   Procalcitonin - Baseline     Status: None   Collection Time: 07/02/15  4:13 PM  Result Value Ref Range   Procalcitonin 0.14 ng/mL    Comment:        Interpretation: PCT (Procalcitonin) <= 0.5 ng/mL: Systemic infection (sepsis) is not likely. Local bacterial infection is possible. (NOTE)         ICU PCT Algorithm               Non ICU PCT Algorithm    ----------------------------     ------------------------------         PCT < 0.25 ng/mL                 PCT < 0.1 ng/mL     Stopping of antibiotics            Stopping of antibiotics       strongly encouraged.               strongly encouraged.    ----------------------------     ------------------------------       PCT level decrease by               PCT < 0.25 ng/mL       >= 80% from peak PCT       OR PCT 0.25 - 0.5 ng/mL          Stopping of antibiotics                                             encouraged.     Stopping of antibiotics           encouraged.    ----------------------------     ------------------------------       PCT level decrease by              PCT >= 0.25 ng/mL       < 80% from peak PCT        AND PCT >= 0.5 ng/mL            Continuin g antibiotics  encouraged.       Continuing antibiotics            encouraged.    ----------------------------     ------------------------------     PCT level increase compared          PCT > 0.5 ng/mL         with peak PCT AND          PCT >= 0.5 ng/mL             Escalation of antibiotics                                          strongly encouraged.      Escalation of antibiotics        strongly encouraged.    Lactic acid, plasma     Status: None   Collection Time: 07/02/15  4:14 PM  Result Value Ref Range   Lactic Acid, Venous 1.1 0.5 - 2.0 mmol/L  Blood culture (routine x 2)     Status: None (Preliminary result)   Collection Time: 07/02/15  4:14 PM  Result Value Ref Range   Specimen Description BLOOD LT AC    Special Requests BOTTLES DRAWN AEROBIC AND ANAEROBIC AER9CC ANA5CC    Culture NO GROWTH < 24 HOURS    Report Status PENDING   Urinalysis complete, with microscopic ([facility] only)     Status: Abnormal   Collection Time: 07/02/15  6:24 PM  Result Value Ref Range   Color, Urine AMBER (A) YELLOW   APPearance HAZY (A) CLEAR   Glucose, UA NEGATIVE NEGATIVE mg/dL   Bilirubin Urine NEGATIVE NEGATIVE   Ketones, ur NEGATIVE NEGATIVE mg/dL   Specific Gravity, Urine 1.020 1.005 - 1.030   Hgb urine dipstick 3+ (A) NEGATIVE   pH 5.0 5.0 - 8.0   Protein, ur 30 (A) NEGATIVE mg/dL   Nitrite POSITIVE (A) NEGATIVE   Leukocytes, UA 1+ (A) NEGATIVE   RBC / HPF 0-5 0 - 5 RBC/hpf   WBC, UA 6-30 0 - 5 WBC/hpf   Bacteria, UA RARE (A) NONE SEEN   Squamous Epithelial / LPF 0-5 (A) NONE SEEN   Mucous PRESENT    Hyaline Casts, UA PRESENT   Sodium, urine, random     Status: None   Collection Time: 07/02/15  6:24 PM  Result Value Ref Range   Sodium, Ur 24 mmol/L  Osmolality, urine     Status: None   Collection Time: 07/02/15  6:24 PM  Result Value Ref Range   Osmolality, Ur 700 300 - 900 mOsm/kg  Lactic acid, plasma     Status: None   Collection Time: 07/02/15  7:10 PM  Result Value Ref Range   Lactic Acid, Venous 1.0 0.5 - 2.0 mmol/L  CBC     Status: Abnormal   Collection Time: 07/03/15  5:16 AM  Result Value Ref Range   WBC 11.4 (H) 3.6 - 11.0 K/uL   RBC 3.90 3.80 - 5.20 MIL/uL   Hemoglobin 11.8 (L) 12.0 - 16.0 g/dL   HCT 34.8 (L) 35.0 - 47.0 %   MCV 89.2 80.0 - 100.0 fL   MCH 30.2 26.0 - 34.0 pg   MCHC 33.8 32.0 - 36.0 g/dL   RDW 12.3 11.5 - 14.5 %   Platelets 382 150 - 440 K/uL  Basic  metabolic panel     Status:  Abnormal   Collection Time: 07/03/15  5:16 AM  Result Value Ref Range   Sodium 126 (L) 135 - 145 mmol/L   Potassium 3.9 3.5 - 5.1 mmol/L   Chloride 91 (L) 101 - 111 mmol/L   CO2 27 22 - 32 mmol/L   Glucose, Bld 132 (H) 65 - 99 mg/dL   BUN 18 6 - 20 mg/dL   Creatinine, Ser 0.58 0.44 - 1.00 mg/dL   Calcium 8.3 (L) 8.9 - 10.3 mg/dL   GFR calc non Af Amer >60 >60 mL/min   GFR calc Af Amer >60 >60 mL/min    Comment: (NOTE) The eGFR has been calculated using the CKD EPI equation. This calculation has not been validated in all clinical situations. eGFR's persistently <60 mL/min signify possible Chronic Kidney Disease.    Anion gap 8 5 - 15    Dg Chest 2 View  07/02/2015  CLINICAL DATA:  Chest pain for 2 weeks EXAM: CHEST  2 VIEW COMPARISON:  None. FINDINGS: Cardiac shadow is enlarged. Aortic calcifications are noted. A small right-sided pleural effusion is seen. Large left-sided pleural effusion and likely underlying infiltrate is present. This is new from the prior exam. No acute bony abnormality is seen. IMPRESSION: Bilateral pleural effusions left greater than right. There is likely underlying left basilar infiltrate present as well. Electronically Signed   By: Inez Catalina M.D.   On: 07/02/2015 18:21    Review of Systems  Constitutional: Positive for malaise/fatigue.  HENT: Positive for congestion.   Eyes: Negative.   Respiratory: Positive for cough and shortness of breath.   Cardiovascular: Positive for chest pain, palpitations, orthopnea, leg swelling and PND.  Gastrointestinal: Positive for heartburn.  Genitourinary: Negative.   Musculoskeletal: Negative.   Skin: Negative.   Neurological: Positive for weakness.  Endo/Heme/Allergies: Negative.   Psychiatric/Behavioral: Negative.    Blood pressure 134/73, pulse 73, temperature 97.7 F (36.5 C), temperature source Oral, resp. rate 16, height 5' 5" (1.651 m), weight 77.656 kg (171 lb 3.2 oz), SpO2 97  %. Physical Exam  Nursing note and vitals reviewed. Constitutional: She is oriented to person, place, and time. She appears well-developed and well-nourished.  HENT:  Head: Normocephalic and atraumatic.  Eyes: Conjunctivae and EOM are normal. Pupils are equal, round, and reactive to light.  Neck: Normal range of motion. Neck supple.  Cardiovascular: Intact distal pulses and normal pulses.  An irregularly irregular rhythm present. Tachycardia present.   Murmur heard.  Systolic murmur is present with a grade of 2/6  Respiratory: Effort normal and breath sounds normal.  GI: Soft. Bowel sounds are normal.  Musculoskeletal: Normal range of motion.  Neurological: She is alert and oriented to person, place, and time. She has normal reflexes.  Skin: Skin is warm and dry.  Psychiatric: She has a normal mood and affect.    Assessment/Plan: Atrial fibrillation rapid ventricular response Angina possibly unstable Shortness of breath possible heart failure Fatigue GERD Esophageal stricture Upper lipidemia Possible bronchitis Hyponatremia Possible pneumonia . Plan Agree with admission place on telemetry Rate control for atrial fibrillation will add amiodarone load Continue metoprolol for rate Consider long-term anticoagulation DVT prophylaxis Correct electrolyte abnormality Supplemental oxygen as needed Antibiotic therapy for possible pneumonia Consider diuretic therapy for effusion and possible systolic heart failure Consider functional study or cardiac catheter for definitive evaluation of anginal symptoms Recommend GI evaluation for esophageal stricture Continue Protonix therapy for reflux symptoms  CALLWOOD,DWAYNE D. 07/03/2015, 9:46 AM

## 2015-07-03 NOTE — Progress Notes (Signed)
Pt started on amiodarone gtt. Pt and daughter educated on process. Central tele notified.

## 2015-07-04 ENCOUNTER — Inpatient Hospital Stay: Payer: Commercial Managed Care - HMO

## 2015-07-04 LAB — PROCALCITONIN: Procalcitonin: <0.1 ng/mL

## 2015-07-04 LAB — CBC
HCT: 34 % — ABNORMAL LOW (ref 35.0–47.0)
Hemoglobin: 11.5 g/dL — ABNORMAL LOW (ref 12.0–16.0)
MCH: 30 pg (ref 26.0–34.0)
MCHC: 33.8 g/dL (ref 32.0–36.0)
MCV: 88.8 fL (ref 80.0–100.0)
PLATELETS: 349 10*3/uL (ref 150–440)
RBC: 3.83 MIL/uL (ref 3.80–5.20)
RDW: 12.8 % (ref 11.5–14.5)
WBC: 9.9 10*3/uL (ref 3.6–11.0)

## 2015-07-04 LAB — BASIC METABOLIC PANEL
Anion gap: 7 (ref 5–15)
BUN: 20 mg/dL (ref 6–20)
CALCIUM: 8.2 mg/dL — AB (ref 8.9–10.3)
CHLORIDE: 90 mmol/L — AB (ref 101–111)
CO2: 29 mmol/L (ref 22–32)
CREATININE: 0.55 mg/dL (ref 0.44–1.00)
GFR calc non Af Amer: >60 mL/min (ref >60)
GLUCOSE: 109 mg/dL — AB (ref 65–99)
Potassium: 3.8 mmol/L (ref 3.5–5.1)
Sodium: 126 mmol/L — ABNORMAL LOW (ref 135–145)

## 2015-07-04 MED ORDER — SODIUM CHLORIDE 0.9 % IV SOLN: INTRAVENOUS | Status: DC

## 2015-07-04 MED ORDER — ENOXAPARIN SODIUM 80 MG/0.8ML ~~LOC~~ SOLN
1.0000 mg/kg | Freq: Two times a day (BID) | SUBCUTANEOUS | Status: AC
  Administered 2015-07-04: 80 mg via SUBCUTANEOUS
  Filled 2015-07-04: qty 0.8

## 2015-07-04 MED ORDER — SODIUM CHLORIDE 0.9 % IV SOLN
INTRAVENOUS | Status: AC
  Administered 2015-07-04 – 2015-07-05 (×2): via INTRAVENOUS

## 2015-07-04 MED ORDER — AMIODARONE HCL 200 MG PO TABS
400.0000 mg | ORAL_TABLET | Freq: Two times a day (BID) | ORAL | Status: DC
  Administered 2015-07-04 – 2015-07-09 (×11): 400 mg via ORAL
  Filled 2015-07-04 (×12): qty 2

## 2015-07-04 NOTE — Progress Notes (Signed)
[place] [facility] [place] NAME: Caroline Aguilar    MR#:  [id number]  DATE OF BIRTH:  March 30, 1926  SUBJECTIVE:  CHIEF COMPLAINT:   Chief Complaint  Patient presents with  . Weakness  . Shortness of Breath   - Remains in afib, on amiodarone drip. - Feels very weak and noted to be very dyspneic even with minimal exertion. Not requiring oxygen though, sats are 100% on room air  REVIEW OF SYSTEMS:  Review of Systems  Constitutional: Positive for malaise/fatigue. Negative for fever and chills.  HENT: Negative for ear discharge, ear pain and nosebleeds.   Eyes: Negative for blurred vision and double vision.  Respiratory: Positive for shortness of breath. Negative for cough and wheezing.   Cardiovascular: Positive for palpitations. Negative for chest pain and leg swelling.  Gastrointestinal: Negative for nausea, vomiting, abdominal pain, diarrhea and constipation.  Genitourinary: Negative for dysuria.  Musculoskeletal: Negative for myalgias.  Neurological: Negative for dizziness, tremors, sensory change, speech change, focal weakness, seizures and headaches.  Psychiatric/Behavioral: Negative for depression.    DRUG ALLERGIES:   Allergies  Allergen Reactions  . Penicillins Anaphylaxis and Other (See Comments)    Has patient had a PCN reaction causing immediate rash, facial/tongue/throat swelling, SOB or lightheadedness with hypotension: Yes Has patient had a PCN reaction causing severe rash involving mucus membranes or skin necrosis: No Has patient had a PCN reaction that required hospitalization No Has patient had a PCN reaction occurring within the last 10 years: No If all of the above answers are "NO", then may proceed with Cephalosporin use.  . Azithromycin Other (See Comments)    Reaction:  Unknown   . Famciclovir Other (See Comments)    Reaction:  Unknown     VITALS:  Blood pressure 109/60, pulse 101, temperature 98 F (36.7 C),  temperature source Oral, resp. rate 17, height 5\' 5"  (1.651 m), weight 77.656 kg (171 lb 3.2 oz), SpO2 96 %.  PHYSICAL EXAMINATION:  Physical Exam  GENERAL:  82 y.o.-year-old patient lying in the bed with no acute distress.  EYES: Pupils equal, round, reactive to light and accommodation. No scleral icterus. Extraocular muscles intact.  HEENT: Head atraumatic, normocephalic. Oropharynx and nasopharynx clear.  NECK:  Supple, no jugular venous distention. No thyroid enlargement, no tenderness.  LUNGS: Coarse rhonchi and decreased breath sounds posteriorly more prominent on the left side. No use of accessory muscles of respiration.  CARDIOVASCULAR: S1, S2 normal. No rubs, or gallops. 3/6 systolic murmur is present ABDOMEN: Soft, nontender, nondistended. Bowel sounds present. No organomegaly or mass.  EXTREMITIES: No pedal edema, cyanosis, or clubbing.  NEUROLOGIC: Cranial nerves II through XII are intact. Muscle strength 5/5 in all extremities. Sensation intact. Gait not checked.  PSYCHIATRIC: [name].  SKIN: No obvious rash, lesion, or ulcer.    LABORATORY PANEL:   CBC  Recent Labs Lab 07/04/15 0510  WBC 9.9  HGB 11.5*  HCT 34.0*  PLT 349   ------------------------------------------------------------------------------------------------------------------  Chemistries   Recent Labs Lab 07/04/15 0510  NA 126*  K 3.8  CL 90*  CO2 29  GLUCOSE 109*  BUN 20  CREATININE 0.55  CALCIUM 8.2*   ------------------------------------------------------------------------------------------------------------------  Cardiac Enzymes No results for input(s): TROPONINI in the last 168 hours. ------------------------------------------------------------------------------------------------------------------  RADIOLOGY:  Dg Chest 2 View  07/02/2015  CLINICAL DATA:  Chest pain for 2 weeks EXAM: CHEST  2 VIEW COMPARISON:  None. FINDINGS: Cardiac shadow is  enlarged.  Aortic calcifications are noted. A small right-sided pleural effusion is seen. Large left-sided pleural effusion and likely underlying infiltrate is present. This is new from the prior exam. No acute bony abnormality is seen. IMPRESSION: Bilateral pleural effusions left greater than right. There is likely underlying left basilar infiltrate present as well. Electronically Signed   By: Inez Catalina M.D.   On: 07/02/2015 18:21    EKG:   Orders placed or performed during the hospital encounter of 07/02/15  . EKG 12-Lead  . EKG 12-Lead    ASSESSMENT AND PLAN:   31 y/oF with PMH of HTN, Hyperlipidemia who is independent at baseline presents to the hospital secondary to worsening dyspnea Denies any chest pain or palpitations. Noted to be in A. fib with RVR.  #1 Atrial fibrillation with rvr- rate better controlled - on amiodarone drip- changed to oral amiodarone at 400 mg by mouth twice a day today, also on oral metoprolol -Add Cardizem if needed to. -Echocardiogram just done last week showing normal ejection fraction. Minimal mitral stenosis. -On Lovenox  therapeutic dose for A. fib. Will need anticoagulation prior to discharge.  #2 dyspnea on exertion-worsening over the last month.  -CT angiogram done last week did not show any pulmonary embolism.  -Sticks ran admission showing moderate left pleural effusion. Repeat chest x-ray today confirming the same., -Did not improve with Lasix and also caused more hyponatremia. --Echo is normal too.  - Myoview tomorrow to rule out cardiac causes of dyspnea. - Also ultrasound-guided thoracentesis for the left side to see if that would improve her dyspnea. Lovenox will be held after this evening's dose. -Incentive spirometer given  #3 GERD-on Protonix  #4 depression-on Zoloft  #5 DVT prophylaxis-on Lovenox   Physical therapy consulted   All the records are reviewed and case discussed with Care Management/Social Workerr. Management plans  discussed with the patient, family and they are in agreement.  CODE STATUS: Full Code  TOTAL TIME TAKING CARE OF THIS PATIENT: 36 minutes.   POSSIBLE D/C IN 2-3 DAYS, DEPENDING ON CLINICAL CONDITION.   Gladstone Lighter M.D on 07/04/2015 at 12:56 PM  Between 7am to 6pm - Pager - [phone]  After 6pm go to [email or web address] - password [email or web address] [facility]  Office  [phone]  CC: Primary care physician; [name], MD

## 2015-07-04 NOTE — Clinical Social Work Note (Signed)
Clinical Social Work Assessment  Patient Details  Name: Caroline Aguilar MRN: [id number] Date of Birth: [birth year]  Date of referral:  07/04/15               Reason for consult:  Facility Placement                Permission sought to share information with:  Family Supports Permission granted to share information::  Yes, Verbal Permission Granted  Name::     Lyndzee Kliebert [phone]  Agency::  If needed- pt prefers to return home  Relationship::  yes  Contact Information:  yes  Housing/Transportation Living arrangements for the past 2 months:  Apartment Source of Information:  Patient, Adult Children Patient Interpreter Needed:  None Criminal Activity/Legal Involvement Pertinent to Current Situation/Hospitalization:  No - Comment as needed Significant Relationships:  Adult Children, Church, [employer], Friend, Other Family Members Lives with:  Self Do you feel safe going back to the place where you live?  Yes Need for family participation in patient care:  Yes (Comment)  Care giving concerns:  Family has in home supports and are very involved with patients day to day care.   Social Worker assessment / plan:  LCSW met with patient and daughter in law. Its the patients and family preference when patient well she will discharge home. Patient is oriented to person ,place and situation, she is very active daily ( exercises 20 minutes a day) She reports she is able to shower and dress herself and feed herself. She is currently having some heart issues and weakness. She uses her walker now as a safety net. She is not incontinent. She has excellent hearing, ok vision and wears glasses and has no current diet restrictions. Family very involved with day to day life. Patient has [employer] Medicare Gold plus HMO [employer]/ and Medicaid [place].  Employment status:  Retired Forensic scientist:  Medicaid [place] ([employer] [place]/medicaid) PT Recommendations:  Not assessed at this  time Information / Referral to community resources:  Acute Rehab, [place]  Patient/Family's Response to care:  They are pleased with patients progress and feel she can be care for better at home  Patient/Family's Understanding of and Emotional Response to Diagnosis, Current Treatment, and Prognosis:  They understand patient is weak at this time and care may need to be increased. Emotional Assessment Appearance:  Appears stated age Attitude/Demeanor/Rapport:   (Pleasant- cooperative) Affect (typically observed):  Accepting, Adaptable, Hopeful, Calm Orientation:  Oriented to Self, Oriented to Place, Oriented to  Time, Oriented to Situation, Fluctuating Orientation (Suspected and/or reported Sundowners) Alcohol / Substance use:  Never Used Psych involvement (Current and /or in the community):  No (Comment)  Discharge Needs  Concerns to be addressed:  No discharge needs identified Readmission within the last 30 days:  Yes Current discharge risk:  Other (medical concerns to be monitered/addressed) Barriers to Discharge:  Continued Medical Work up   [employer], LCSW 07/04/2015, 9:54 AM

## 2015-07-04 NOTE — Progress Notes (Signed)
Pt. Signed consent for lexiscan, consent on chart, pt. Also given education on thoracentesis and lexiscan. All questions were answered completely.

## 2015-07-04 NOTE — NC FL2 (Deleted)
[place] LEVEL OF CARE SCREENING TOOL     IDENTIFICATION  Patient Name: Caroline Aguilar Birthdate: [birth year] Sex: female Admission Date (Current Location): 07/02/2015  [facility] and [place] Number:  Engineering geologist and Address:  [facility], [address], [place], [place] [address]      Provider Number: [id number]  Attending Physician Name and Address:  Gladstone Lighter, MD  Relative Name and Phone Number:       Current Level of Care: Hospital Recommended Level of Care: [place] Prior Approval Number:    Date Approved/Denied:   PASRR Number:    Discharge Plan: SNF    Current Diagnoses: Patient Active Problem List   Diagnosis Date Noted  . Community acquired pneumonia 07/02/2015  . Hyponatremia 07/02/2015  . Atrial fibrillation with rapid ventricular response ([place]) 07/02/2015    Orientation RESPIRATION BLADDER Height & Weight     Self, Time, Situation, Place  Normal Continent Weight: 171 lb 3.2 oz (77.656 kg) Height:  5\' 5"  (165.1 cm)  BEHAVIORAL SYMPTOMS/MOOD NEUROLOGICAL BOWEL NUTRITION STATUS      Continent Diet (Normal)  AMBULATORY STATUS COMMUNICATION OF NEEDS Skin   Supervision Verbally Normal                       Personal Care Assistance Level of Assistance  Bathing, Feeding, Dressing, Total care Bathing Assistance: Independent Feeding assistance: Independent Dressing Assistance: Independent Total Care Assistance: Independent   Functional Limitations Info  Sight, Hearing, Speech Sight Info: Adequate (wears glasses) Hearing Info: Adequate Speech Info: Adequate    SPECIAL CARE FACTORS FREQUENCY   (TBD)                    Contractures      Additional Factors Info                  Current Medications (07/04/2015):  This is the current hospital active medication list Current Facility-Administered Medications  Medication Dose Route Frequency Provider Last Rate Last  Dose  . acetaminophen (TYLENOL) tablet 650 mg  650 mg Oral Q6H PRN Lytle Butte, MD       Or  . acetaminophen (TYLENOL) suppository 650 mg  650 mg Rectal Q6H PRN Lytle Butte, MD      . amiodarone (NEXTERONE PREMIX) 360 MG/200ML (1.8 mg/mL) IV infusion  30 mg/hr Intravenous Continuous Dwayne D Callwood, MD 16.7 mL/hr at 07/03/15 2342 30 mg/hr at 07/03/15 2342  . aspirin EC tablet 81 mg  81 mg Oral QHS Lytle Butte, MD   81 mg at 07/03/15 2120  . calcium-vitamin D (OSCAL WITH D) 500-200 MG-UNIT per tablet 1 tablet  1 tablet Oral Daily Lytle Butte, MD   1 tablet at 07/03/15 [phone]  . docusate sodium (COLACE) capsule 100 mg  100 mg Oral Daily Lytle Butte, MD   100 mg at 07/03/15 [id number]  . enoxaparin (LOVENOX) injection 80 mg  1 mg/kg Subcutaneous Q12H Gladstone Lighter, MD   80 mg at 07/03/15 2337  . latanoprost (XALATAN) 0.005 % ophthalmic solution 1 drop  1 drop Both Eyes QHS Lytle Butte, MD   1 drop at 07/03/15 2120  . levofloxacin (LEVAQUIN) IVPB 500 mg  500 mg Intravenous Q24H Lytle Butte, MD 100 mL/hr at 07/03/15 2120 500 mg at 07/03/15 2120  . metoprolol succinate (TOPROL-XL) 24 hr tablet 12.5 mg  12.5 mg Oral QHS Lytle Butte, MD  12.5 mg at 07/03/15 2120  . metoprolol succinate (TOPROL-XL) 24 hr tablet 25 mg  25 mg Oral q morning - 10a Lytle Butte, MD   25 mg at 07/03/15 [phone]  . morphine 2 MG/ML injection 2 mg  2 mg Intravenous Q4H PRN Lytle Butte, MD      . ondansetron [facility]) tablet 4 mg  4 mg Oral Q6H PRN Lytle Butte, MD       Or  . ondansetron [facility]) injection 4 mg  4 mg Intravenous Q6H PRN Lytle Butte, MD      . oxyCODONE (Oxy IR/ROXICODONE) immediate release tablet 5 mg  5 mg Oral Q4H PRN Lytle Butte, MD      . pantoprazole (PROTONIX) EC tablet 40 mg  40 mg Oral Daily Lytle Butte, MD   40 mg at 07/03/15 [id number]  . sertraline (ZOLOFT) tablet 50 mg  50 mg Oral QHS Lytle Butte, MD   50 mg at 07/02/15 2220  . simethicone (MYLICON) chewable tablet 80 mg  80 mg Oral  QID PRN Sylvan Cheese, MD   80 mg at 07/03/15 0051  . simvastatin (ZOCOR) tablet 20 mg  20 mg Oral QHS Lytle Butte, MD   20 mg at 07/03/15 2120  . timolol (TIMOPTIC) 0.5 % ophthalmic solution 1 drop  1 drop Both Eyes BID Lytle Butte, MD   1 drop at 07/03/15 2120     Discharge Medications: Please see discharge summary for a list of discharge medications.  Relevant Imaging Results:  Relevant Lab Results:   Additional Information    Pauletta Browns, [place], [place]

## 2015-07-04 NOTE — Progress Notes (Signed)
Pt reports that she no longer takes Zoloft; stating that her PCP has taken her off the medication. Pt refused the medication at bedtime.   Iran Sizer M

## 2015-07-04 NOTE — Progress Notes (Signed)
LCSW met with patient who was oriented to person,place and situation and her daughter in law ( Ronn'ys Wife) [phone]. The patient and the family does not want to be placed in a skilled nursing rehab facility and prefers to remain independent and at home. LCSW completed assessment and started FL2 ( in case they change their minds)  [employer] LCSW [phone]

## 2015-07-04 NOTE — Progress Notes (Signed)
PT Cancellation Note  Patient Details Name: Caroline Aguilar MRN: [id number] DOB: [birth year]   Cancelled Treatment:    Reason Eval/Treat Not Completed: Medical issues which prohibited therapy. Chart reviewed, RN consulted. Holding pt treatment at this time due to Na+:126, outside of safe range for PT services per policy. Will monitor remotely and attempt at later date/time.     7:55 AM, 07/04/2015 Etta Grandchild, PT, DPT PRN Physical Therapist - [place] License # [id number] [phone] (mobile)

## 2015-07-04 NOTE — Progress Notes (Signed)
Pt remains a/o. Appetite slightly better. Heart rate in 90's. Amiodarone gtt discontinued. Po amio initiated. Went for cxr. ivf's started.

## 2015-07-05 ENCOUNTER — Inpatient Hospital Stay: Payer: Commercial Managed Care - HMO

## 2015-07-05 LAB — BASIC METABOLIC PANEL
ANION GAP: 8 (ref 5–15)
BUN: 17 mg/dL (ref 6–20)
CO2: 27 mmol/L (ref 22–32)
Calcium: 8.1 mg/dL — ABNORMAL LOW (ref 8.9–10.3)
Chloride: 90 mmol/L — ABNORMAL LOW (ref 101–111)
Creatinine, Ser: 0.46 mg/dL (ref 0.44–1.00)
GFR calc Af Amer: >60 mL/min (ref >60)
Glucose, Bld: 118 mg/dL — ABNORMAL HIGH (ref 65–99)
POTASSIUM: 4.7 mmol/L (ref 3.5–5.1)
SODIUM: 125 mmol/L — AB (ref 135–145)

## 2015-07-05 LAB — BODY FLUID CELL COUNT WITH DIFFERENTIAL
EOS FL: 0 %
Lymphs, Fluid: 84 %
Monocyte-Macrophage-Serous Fluid: 7 %
Neutrophil Count, Fluid: 9 %
WBC FLUID: 576 uL

## 2015-07-05 LAB — URINE CULTURE: Culture: >=100000

## 2015-07-05 LAB — GLUCOSE, SEROUS FLUID: Glucose, Fluid: 117 mg/dL

## 2015-07-05 LAB — LACTATE DEHYDROGENASE, PLEURAL OR PERITONEAL FLUID: LD FL: 84 U/L — AB (ref 3–23)

## 2015-07-05 LAB — PROTEIN, BODY FLUID: Total protein, fluid: <3 g/dL

## 2015-07-05 MED ORDER — ENOXAPARIN SODIUM 80 MG/0.8ML ~~LOC~~ SOLN
1.0000 mg/kg | Freq: Two times a day (BID) | SUBCUTANEOUS | Status: DC
Start: 2015-07-05 — End: 2015-07-05
  Administered 2015-07-05: 80 mg via SUBCUTANEOUS
  Filled 2015-07-05: qty 0.8

## 2015-07-05 MED ORDER — SODIUM CHLORIDE 0.9 % IV BOLUS (SEPSIS)
500.0000 mL | Freq: Once | INTRAVENOUS | Status: AC
  Administered 2015-07-05: 500 mL via INTRAVENOUS

## 2015-07-05 MED ORDER — CEFTRIAXONE SODIUM 1 G IJ SOLR
1.0000 g | INTRAMUSCULAR | Status: DC
  Administered 2015-07-05: 1 g via INTRAVENOUS
  Filled 2015-07-05 (×2): qty 10

## 2015-07-05 NOTE — Progress Notes (Signed)
[place] at [place] NAME: Caroline Aguilar    MR#:  [id number]  DATE OF BIRTH:  22-May-1925  SUBJECTIVE: Patient says that she feels completely tired and dyspneic. With minimal exertion. And unable to complete sentences also and feels short-winded. Thoracocentesis on the left side 600 mL of fluid drained. Unable to go for stress test because she feels super exhausted.  complains of pain at thoracocentesis site.   CHIEF COMPLAINT:   Chief Complaint  Patient presents with  . Weakness  . Shortness of Breath   -  REVIEW OF SYSTEMS:  Review of Systems  Constitutional: Positive for malaise/fatigue. Negative for fever and chills.  HENT: Negative for ear discharge, ear pain and nosebleeds.   Eyes: Negative for blurred vision and double vision.  Respiratory: Positive for shortness of breath. Negative for cough and wheezing.   Cardiovascular: Positive for palpitations. Negative for chest pain and leg swelling.  Gastrointestinal: Negative for nausea, vomiting, abdominal pain, diarrhea and constipation.  Genitourinary: Negative for dysuria.  Musculoskeletal: Negative for myalgias.  Neurological: Negative for dizziness, tremors, sensory change, speech change, focal weakness, seizures and headaches.  Psychiatric/Behavioral: Negative for depression.    DRUG ALLERGIES:   Allergies  Allergen Reactions  . Penicillins Anaphylaxis and Other (See Comments)    Has patient had a PCN reaction causing immediate rash, facial/tongue/throat swelling, SOB or lightheadedness with hypotension: Yes Has patient had a PCN reaction causing severe rash involving mucus membranes or skin necrosis: No Has patient had a PCN reaction that required hospitalization No Has patient had a PCN reaction occurring within the last 10 years: No If all of the above answers are "NO", then may proceed with Cephalosporin use.  . Azithromycin Other (See Comments)    Reaction:  Unknown    . Famciclovir Other (See Comments)    Reaction:  Unknown     VITALS:  Blood pressure 163/62, pulse 62, temperature 98 F (36.7 C), temperature source Oral, resp. rate 18, height 5\' 5"  (1.651 m), weight 77.656 kg (171 lb 3.2 oz), SpO2 97 %.  PHYSICAL EXAMINATION:  Physical Exam  GENERAL:  82 y.o.-year-old patient lying in the bed with no acute distress.  EYES: Pupils equal, round, reactive to light and accommodation. No scleral icterus. Extraocular muscles intact.  HEENT: Head atraumatic, normocephalic. Oropharynx and nasopharynx clear.  NECK:  Supple, no jugular venous distention. No thyroid enlargement, no tenderness.  LUNGS: Coarse rhonchi and decreased breath sounds posteriorly more prominent on the left side. No use of accessory muscles of respiration.  CARDIOVASCULAR: S1, S2 normal. No rubs, or gallops.ESM in aortic area. ABDOMEN: Soft, nontender, nondistended. Bowel sounds present. No organomegaly or mass.  EXTREMITIES: No pedal edema, cyanosis, or clubbing.  NEUROLOGIC: Cranial nerves II through XII are intact. Muscle strength 5/5 in all extremities. Sensation intact. Gait not checked.  PSYCHIATRIC: [name].  SKIN: No obvious rash, lesion, or ulcer.    LABORATORY PANEL:   CBC  Recent Labs Lab 07/04/15 0510  WBC 9.9  HGB 11.5*  HCT 34.0*  PLT 349   ------------------------------------------------------------------------------------------------------------------  Chemistries   Recent Labs Lab 07/05/15 0513  NA 125*  K 4.7  CL 90*  CO2 27  GLUCOSE 118*  BUN 17  CREATININE 0.46  CALCIUM 8.1*   ------------------------------------------------------------------------------------------------------------------  Cardiac Enzymes No results for input(s): TROPONINI in the last 168 hours. ------------------------------------------------------------------------------------------------------------------  RADIOLOGY:  Dg Chest 1  View  07/05/2015  CLINICAL  DATA:  Bilateral pleural effusions. Status post left thoracentesis. EXAM: CHEST 1 VIEW COMPARISON:  Chest x-rays dated 07/04/2015 and 07/02/2015 FINDINGS: There is no pneumothorax after left thoracentesis. Decreased left pleural effusion although some of fluid remains at the left base. Small right effusion is unchanged. Pulmonary vascularity is normal. Aortic atherosclerosis. Thoracic scoliosis and osteopenia and degenerative changes of the shoulders are unchanged. IMPRESSION: No pneumothorax after left thoracentesis. Decreased left pleural effusion. Aortic atherosclerosis. Electronically Signed   By: Lorriane Shire M.D.   On: 07/05/2015 10:00   Dg Chest 2 View  07/04/2015  CLINICAL DATA:  Follow-up pleural effusions. EXAM: CHEST  2 VIEW COMPARISON:  07/02/2015. FINDINGS: Pain no significant change in a moderate to large-sized left pleural effusion and small right pleural effusion. Adjacent left basilar and minimal right basilar airspace opacity is also unchanged. The cardiac silhouette remains enlarged. Diffuse osteopenia. Bilateral shoulder degenerative changes. IMPRESSION: No significant change in bilateral pleural effusions and adjacent atelectasis and/or pneumonia. Electronically Signed   By: Claudie Revering M.D.   On: 07/04/2015 14:44   US Thoracentesis Asp Pleural Space W/img Guide  07/05/2015  INDICATION: Symptomatic left sided pleural effusion - please perform ultrasound-guided thoracentesis for therapeutic and diagnostic purposes. EXAM: US THORACENTESIS ASP PLEURAL SPACE W/IMG GUIDE COMPARISON:  Chest radiograph- 07/04/2015; 07/01/2025 MEDICATIONS: None. COMPLICATIONS: None immediate. TECHNIQUE: Informed written consent was obtained from the patient after a discussion of the risks, benefits and alternatives to treatment. A timeout was performed prior to the initiation of the procedure. Initial ultrasound scanning demonstrates a moderate-sized minimally complex left-sided  pleural effusion. The lower chest was prepped and draped in the usual sterile fashion. 1% lidocaine was used for local anesthesia. An ultrasound image was saved for documentation purposes. An 8 Fr Safe-T-Centesis catheter was introduced. The thoracentesis was performed. The catheter was removed and a dressing was applied. The patient tolerated the procedure well without immediate post procedural complication. The patient was escorted to have an upright chest radiograph. FINDINGS: A total of approximately 600 cc of serous fluid was removed. Requested samples were sent to the laboratory. IMPRESSION: Successful ultrasound-guided left sided thoracentesis yielding 600 cc of pleural fluid. Electronically Signed   By: Sandi Mariscal M.D.   On: 07/05/2015 10:14    EKG:   Orders placed or performed during the hospital encounter of 07/02/15  . EKG 12-Lead  . EKG 12-Lead    ASSESSMENT AND PLAN:   29 y/oF with PMH of HTN, Hyperlipidemia who is independent at baseline presents to the hospital secondary to worsening dyspnea Denies any chest pain or palpitations. Noted to be in A. fib with RVR.  #1 Atrial fibrillation with rvr- rate better controlled;Off the amiodarone drip. Continue oral amiodarone at 400 mg by mouth twice a day today, also on oral metoprolol -Add Cardizem if needed to. -Echocardiogram just done last week showing normal ejection fraction. Minimal mitral stenosis.moderate AS> -On Lovenox  therapeutic dose for A. fib. needs to start the patient on Eliquis on Xarelto I will talk to the patient about risks and benefits of anticoagulation.  #2 dyspnea on exertion-worsening over the last month. Likely due to pleural effusion on the left side: Status post thoracocentesis, 600 mL of fluid drained ;follow cytology and cultures. -CT angiogram done last week did not show any pulmonary embolism.   --Incentive spirometer given  #3 GERD-on Protonix  #4 depression-on Zoloft  #5 DVT prophylaxis-on  Lovenox  6 hyponatremia: Sodium is still low at 125.clinically dry.give NS 500 cc bolus.  Physical therapy consulted In 50% of the time spent in counseling and coordination of the care. Discussed with patient and patient's daughter.  All the records are reviewed and case discussed with Care Management/Social Workerr. Management plans discussed with the patient, family and they are in agreement.  CODE STATUS: Full Code  TOTAL TIME TAKING CARE OF THIS PATIENT: 36 minutes.   POSSIBLE D/C IN 2-3 DAYS, DEPENDING ON CLINICAL CONDITION.   Epifanio Lesches M.D on 07/05/2015 at 12:39 PM  Between 7am to 6pm - Pager - [phone]  After 6pm go to [email or web address] - password [email or web address] [facility]  Office  [phone]  CC: Primary care physician; [name], MD

## 2015-07-05 NOTE — Progress Notes (Signed)
PT Cancellation Note  Patient Details Name: Caroline Aguilar MRN: [id number] DOB: 26-Dec-1925   Cancelled Treatment:    Reason Eval/Treat Not Completed: Other (comment). Pt's chart reviewed and discussed with RN. Upon PT's arrival pt is resting comfortably. Pt's daughter present and declines for pt to participate in therapy at this time due to fatigue. PT will f/u at a later date and complete evaluation when pt is appropriate.   Neoma Laming, PT, DPT  07/05/2015, 2:06 PM [phone]

## 2015-07-05 NOTE — Procedures (Signed)
Successful US guided left sided thoracentesis yielding 600 cc of serous pleural fluid.   Samples sent to lab for analysis. EBL: None No immediate complications.  Ronny Bacon, MD Pager #: [phone]

## 2015-07-05 NOTE — Progress Notes (Signed)
CSW is awaiting PT deposition to determine patient's needs. CSW is aware of assessment weekend social worker completed, patient and families desires for discharge. CSW will continue to follow and assist.   Ernest Pine, MSW, [place] Work Department [phone]

## 2015-07-05 NOTE — Progress Notes (Signed)
OT Cancellation Note  Patient Details Name: Caroline Aguilar MRN: [id number] DOB: 02-22-1926   Cancelled Treatment:    Reason Eval/Treat Not Completed: Other (comment) Pt's daughter present and requested that pt be allowed to rest and hold off on OT evaluation. Will f/u at a later date and complete evaluation when pt is appropriate.   [place], OTR/L ascom [phone] 07/05/2015, 2:46 PM

## 2015-07-05 NOTE — Progress Notes (Signed)
Spoke with Nuclear Medicine - patient refused stress test; they have notified Dr. Clayborn Bigness.

## 2015-07-05 NOTE — Care Management Important Message (Signed)
Important Message  Patient Details  Name: DESIRAYE MESKER MRN: [id number] Date of Birth: 05-Jun-1925   Medicare Important Message Given:  Yes    Juliann Pulse A Arely Tinner 07/05/2015, 1:51 PM

## 2015-07-05 NOTE — Progress Notes (Signed)
Dr. Vianne Bulls notified that patient refused stress test this morning but was able to have 600cc fluid removed by thoracentesis. Orders to resume regular diet.

## 2015-07-05 NOTE — Progress Notes (Signed)
ANTICOAGULATION CONSULT NOTE - follow up Caroline Aguilar for Enoxaparin Indication: atrial fibrillation  Allergies  Allergen Reactions  . Penicillins Anaphylaxis and Other (See Comments)    Has patient had a PCN reaction causing immediate rash, facial/tongue/throat swelling, SOB or lightheadedness with hypotension: Yes Has patient had a PCN reaction causing severe rash involving mucus membranes or skin necrosis: No Has patient had a PCN reaction that required hospitalization No Has patient had a PCN reaction occurring within the last 10 years: No If all of the above answers are "NO", then may proceed with Cephalosporin use.  . Azithromycin Other (See Comments)    Reaction:  Unknown   . Famciclovir Other (See Comments)    Reaction:  Unknown     Patient Measurements: Height: 5\' 5"  (165.1 cm) Weight: 171 lb 3.2 oz (77.656 kg) IBW/kg (Calculated) : 57 Heparin Dosing Weight: na  Vital Signs: Temp: 98 F (36.7 C) (03/20 1128) Temp Source: Oral (03/20 1128) BP: 163/62 mmHg (03/20 1220) Pulse Rate: 62 (03/20 1220)  Labs:  Recent Labs  07/02/15 1613 07/03/15 0516 07/04/15 0510 07/05/15 0513  HGB 11.7* 11.8* 11.5*  --   HCT 35.2 34.8* 34.0*  --   PLT 383 382 349  --   CREATININE 0.69 0.58 0.55 0.46    Estimated Creatinine Clearance: 51.1 mL/min (by C-G formula based on Cr of 0.46).   Medical History: Past Medical History  Diagnosis Date  . Angina pectoris ([place])   . Hyperlipidemia   . Cancer [facility])     Colon cancer    Medications:  Scheduled:  . amiodarone  400 mg Oral BID  . calcium-vitamin D  1 tablet Oral Daily  . cefTRIAXone (ROCEPHIN)  IV  1 g Intravenous Q24H  . docusate sodium  100 mg Oral Daily  . enoxaparin (LOVENOX) injection  1 mg/kg Subcutaneous Q12H  . latanoprost  1 drop Both Eyes QHS  . metoprolol succinate  12.5 mg Oral QHS  . metoprolol succinate  25 mg Oral q morning - 10a  . pantoprazole  40 mg Oral Daily  . sertraline  50 mg Oral  QHS  . simvastatin  20 mg Oral QHS  . timolol  1 drop Both Eyes BID    Assessment: Patient is a 82yo female with new onset afib. Pharmacy consulted for enoxaparin dosing. Wt 77.7 kg Crcl 51.1 ml/min  Goal of Therapy:   Monitor platelets by anticoagulation protocol: Yes   Plan:  Lovenox 1mg /kg (80mg ) SQ q12h.  Chinita Greenland PharmD Clinical Pharmacist 07/05/2015 3:36 PM

## 2015-07-06 ENCOUNTER — Encounter
Admission: EM | Disposition: A | Discharge status: Home Health | Payer: Self-pay | Source: Home / Self Care | Attending: Internal Medicine

## 2015-07-06 LAB — OSMOLALITY: OSMOLALITY: 266 mosm/kg — AB (ref 275–295)

## 2015-07-06 LAB — TSH: TSH: 5.002 u[IU]/mL — AB (ref 0.350–4.500)

## 2015-07-06 LAB — URIC ACID: Uric Acid, Serum: 4 mg/dL (ref 2.3–6.6)

## 2015-07-06 LAB — OSMOLALITY, URINE: Osmolality, Ur: 655 mOsm/kg (ref 300–900)

## 2015-07-06 LAB — BASIC METABOLIC PANEL
ANION GAP: 7 (ref 5–15)
BUN: 15 mg/dL (ref 6–20)
CALCIUM: 7.6 mg/dL — AB (ref 8.9–10.3)
CO2: 26 mmol/L (ref 22–32)
Chloride: 89 mmol/L — ABNORMAL LOW (ref 101–111)
Creatinine, Ser: 0.54 mg/dL (ref 0.44–1.00)
Glucose, Bld: 101 mg/dL — ABNORMAL HIGH (ref 65–99)
Potassium: 4 mmol/L (ref 3.5–5.1)
Sodium: 122 mmol/L — ABNORMAL LOW (ref 135–145)

## 2015-07-06 LAB — MISC LABCORP TEST (SEND OUT)

## 2015-07-06 LAB — CYTOLOGY - NON PAP

## 2015-07-06 LAB — SODIUM, URINE, RANDOM: Sodium, Ur: 19 mmol/L

## 2015-07-06 LAB — PROCALCITONIN

## 2015-07-06 LAB — CREATININE, URINE, RANDOM: CREATININE, URINE: 120 mg/dL

## 2015-07-06 SURGERY — RIGHT/LEFT HEART CATH AND CORONARY ANGIOGRAPHY
Anesthesia: Moderate Sedation

## 2015-07-06 MED ORDER — CEPHALEXIN 500 MG PO CAPS
500.0000 mg | ORAL_CAPSULE | Freq: Two times a day (BID) | ORAL | Status: DC
  Administered 2015-07-06 – 2015-07-09 (×6): 500 mg via ORAL
  Filled 2015-07-06 (×6): qty 1

## 2015-07-06 MED ORDER — FUROSEMIDE 20 MG PO TABS
20.0000 mg | ORAL_TABLET | Freq: Every day | ORAL | Status: DC
  Administered 2015-07-06 – 2015-07-09 (×4): 20 mg via ORAL
  Filled 2015-07-06 (×4): qty 1

## 2015-07-06 MED ORDER — SODIUM CHLORIDE 0.9 % IV SOLN: 250.0000 mL | INTRAVENOUS | Status: DC | PRN

## 2015-07-06 MED ORDER — SODIUM CHLORIDE 1 G PO TABS
1.0000 g | ORAL_TABLET | Freq: Two times a day (BID) | ORAL | Status: DC
Start: 2015-07-06 — End: 2015-07-09
  Administered 2015-07-06 – 2015-07-09 (×5): 1 g via ORAL
  Filled 2015-07-06 (×9): qty 1

## 2015-07-06 MED ORDER — SODIUM CHLORIDE 0.9 % WEIGHT BASED INFUSION: 3.0000 mL/kg/h | INTRAVENOUS | Status: DC

## 2015-07-06 MED ORDER — ASPIRIN 81 MG PO CHEW
81.0000 mg | CHEWABLE_TABLET | ORAL | Status: AC
  Administered 2015-07-07: 81 mg via ORAL
  Filled 2015-07-06: qty 1

## 2015-07-06 MED ORDER — DEXTROSE 5 % IV SOLN
1.0000 g | INTRAVENOUS | Status: DC
  Filled 2015-07-06: qty 10

## 2015-07-06 MED ORDER — SODIUM CHLORIDE 0.9 % WEIGHT BASED INFUSION: 1.0000 mL/kg/h | INTRAVENOUS | Status: DC

## 2015-07-06 MED ORDER — ENOXAPARIN SODIUM 80 MG/0.8ML ~~LOC~~ SOLN
80.0000 mg | Freq: Two times a day (BID) | SUBCUTANEOUS | Status: DC
  Administered 2015-07-06 – 2015-07-09 (×4): 80 mg via SUBCUTANEOUS
  Filled 2015-07-06 (×4): qty 0.8

## 2015-07-06 MED ORDER — SODIUM CHLORIDE 0.9% FLUSH
3.0000 mL | Freq: Two times a day (BID) | INTRAVENOUS | Status: DC
  Administered 2015-07-06: 3 mL via INTRAVENOUS

## 2015-07-06 MED ORDER — SODIUM CHLORIDE 0.9% FLUSH: 3.0000 mL | INTRAVENOUS | Status: DC | PRN

## 2015-07-06 NOTE — Evaluation (Signed)
Physical Therapy Evaluation Patient Details Name: Caroline Aguilar MRN: [id number] DOB: Mar 10, 1926 Today's Date: 07/06/2015   History of Present Illness  82 yo F presented to ED on 3/17 due to 1 week duration of SOB, found to have a-fib with RVR, pleural effusion, and hyponatremia. PMH includes angina pectoris, colon CA and hyperlipidemia.  Clinical Impression  Pt demonstrates generalized weakness and difficulty walking with her main deficit of decreased endurance. She ranges 92 to 97% SpO2 on RA and HR ranges 80 to 99 during session. For bed mobility she requires min A, mainly for lifting LEs. Transfers and ambulation required min guard with FWW and occasional cues for safety. Both seated and standing balance is fair with no LOB. HHPT is recommended to increase strength and endurance to return to PLOF. Pt will benefit from skilled PT services to increase functional I and mobility for safe discharge.     Follow Up Recommendations Home health PT    Equipment Recommendations  None recommended by PT    Recommendations for Other Services       Precautions / Restrictions Precautions Precautions: Fall Restrictions Weight Bearing Restrictions: No      Mobility  Bed Mobility Overal bed mobility: Needs Assistance Bed Mobility: Supine to Sit;Sit to Supine     Supine to sit: Min assist;HOB elevated Sit to supine: Min assist;HOB elevated   General bed mobility comments: uses rail, difficulty lifting legs  Transfers Overall transfer level: Needs assistance Equipment used: Rolling walker (2 wheeled) Transfers: Sit to/from [facility] Sit to Stand: Min guard Stand pivot transfers: Min guard       General transfer comment: cues for hand placement; steady with no LOB  Ambulation/Gait Ambulation/Gait assistance: Min guard Ambulation Distance (Feet): 10 Feet Assistive device: Rolling walker (2 wheeled) Gait Pattern/deviations: Decreased stride length;Trunk flexed      General Gait Details: Demonstrated slow cadence but steady. No LOB noted.  Stairs            Wheelchair Mobility    Modified Rankin (Stroke Patients Only)       Balance Overall balance assessment: Needs assistance Sitting-balance support: Single extremity supported;Feet supported Sitting balance-Leahy Scale: Fair Sitting balance - Comments: maintains with no LOB   Standing balance support: Bilateral upper extremity supported Standing balance-Leahy Scale: Fair Standing balance comment: steady with no LOB                             Pertinent Vitals/Pain Pain Assessment: No/denies pain    Home Living Family/patient expects to be discharged to:: Private residence Living Arrangements: Alone Available Help at Discharge: Family Type of Home: House Home Access: Stairs to enter Entrance Stairs-Rails: Right Entrance Stairs-Number of Steps: 4 Home Layout: One level Home Equipment: Walker - 2 wheels;Cane - single point      Prior Function Level of Independence: Independent with assistive device(s)         Comments: Lived very independently, community ambulation with AD, drives, exercises daily. Family assistance for grocery shopping and cleaning house. No recent history of falls.      Hand Dominance        Extremity/Trunk Assessment   Upper Extremity Assessment: Generalized weakness           Lower Extremity Assessment: Generalized weakness (B LE strength grossly 3+/5 )         Communication   Communication: No difficulties  Cognition Arousal/Alertness: Awake/alert Behavior During Therapy: WFL for  tasks assessed/performed Overall Cognitive Status: Within Functional Limits for tasks assessed                      General Comments      Exercises Other Exercises Other Exercises: Pt sat at EOB ~ 8 minutes without trunk support to increase activity tolerance with min guard and no LOB. Vitals frequently monitored and pt cued for  pursed lip breathing.       Assessment/Plan    PT Assessment Patient needs continued PT services  PT Diagnosis Difficulty walking;Generalized weakness   PT Problem List Decreased strength;Decreased activity tolerance;Decreased balance;Decreased mobility;Cardiopulmonary status limiting activity  PT Treatment Interventions Gait training;Stair training;Therapeutic activities;Therapeutic exercise;Balance training;Neuromuscular re-education;Patient/family education   PT Goals (Current goals can be found in the Care Plan section) Acute Rehab PT Goals Patient Stated Goal: To get stronger PT Goal Formulation: With patient/family Time For Goal Achievement: 07/20/15 Potential to Achieve Goals: Fair    Frequency Min 2X/week   Barriers to discharge Inaccessible home environment steps to enter    Co-evaluation               End of Session Equipment Utilized During Treatment: Gait belt Activity Tolerance: Patient tolerated treatment well;Patient limited by fatigue Patient left: in bed;with call bell/phone within reach;with bed alarm set;with family/visitor present Nurse Communication: Mobility status         Time: 1135-1200 PT Time Calculation (min) (ACUTE ONLY): 25 min   Charges:   PT Evaluation $PT Eval Moderate Complexity: 1 Procedure PT Treatments $Therapeutic Activity: 8-22 mins   PT G Codes:        Neoma Laming, PT, DPT  07/06/2015, 1:22 PM [phone]

## 2015-07-06 NOTE — Progress Notes (Signed)
[facility] [place] NAME: Caroline Aguilar    MR#:  [id number]  DATE OF BIRTH:  11-10-1925  SUBJECTIVE:says she feels better today.walked with PT.less sob.  CHIEF COMPLAINT:   Chief Complaint  Patient presents with  . Weakness  . Shortness of Breath   -  REVIEW OF SYSTEMS:  Review of Systems  Constitutional: Negative for fever and chills.  HENT: Negative for ear discharge, ear pain and nosebleeds.   Eyes: Negative for blurred vision and double vision.  Respiratory: Negative for cough, shortness of breath and wheezing.   Cardiovascular: Negative for chest pain, palpitations and leg swelling.  Gastrointestinal: Negative for nausea, vomiting, abdominal pain, diarrhea and constipation.  Genitourinary: Negative for dysuria.  Musculoskeletal: Negative for myalgias.  Neurological: Negative for dizziness, tremors, sensory change, speech change, focal weakness, seizures and headaches.  Psychiatric/Behavioral: Negative for depression.    DRUG ALLERGIES:   Allergies  Allergen Reactions  . Penicillins Anaphylaxis and Other (See Comments)    Has patient had a PCN reaction causing immediate rash, facial/tongue/throat swelling, SOB or lightheadedness with hypotension: Yes Has patient had a PCN reaction causing severe rash involving mucus membranes or skin necrosis: No Has patient had a PCN reaction that required hospitalization No Has patient had a PCN reaction occurring within the last 10 years: No If all of the above answers are "NO", then may proceed with Cephalosporin use.  . Azithromycin Other (See Comments)    Reaction:  Unknown   . Famciclovir Other (See Comments)    Reaction:  Unknown     VITALS:  Blood pressure 120/61, pulse 99, temperature 97.9 F (36.6 C), temperature source Oral, resp. rate 18, height 5\' 5"  (1.651 m), weight 77.656 kg (171 lb 3.2 oz), SpO2 95 %.  PHYSICAL EXAMINATION:  Physical Exam  GENERAL:  82  y.o.-year-old patient lying in the bed with no acute distress.  EYES: Pupils equal, round, reactive to light and accommodation. No scleral icterus. Extraocular muscles intact.  HEENT: Head atraumatic, normocephalic. Oropharynx and nasopharynx clear.  NECK:  Supple, no jugular venous distention. No thyroid enlargement, no tenderness.  LUNGS:Bilaterally clear to auscultation.No use of accessory muscles of respiration.  CARDIOVASCULAR: S1, S2 normal. No rubs, or gallops.ESM in aortic area. ABDOMEN: Soft, nontender, nondistended. Bowel sounds present. No organomegaly or mass.  EXTREMITIES: No pedal edema, cyanosis, or clubbing.  NEUROLOGIC: Cranial nerves II through XII are intact. Muscle strength 5/5 in all extremities. Sensation intact. Gait not checked.  PSYCHIATRIC: [name].  SKIN: No obvious rash, lesion, or ulcer.    LABORATORY PANEL:   CBC  Recent Labs Lab 07/04/15 0510  WBC 9.9  HGB 11.5*  HCT 34.0*  PLT 349   ------------------------------------------------------------------------------------------------------------------  Chemistries   Recent Labs Lab 07/06/15 0500  NA 122*  K 4.0  CL 89*  CO2 26  GLUCOSE 101*  BUN 15  CREATININE 0.54  CALCIUM 7.6*   ------------------------------------------------------------------------------------------------------------------  Cardiac Enzymes No results for input(s): TROPONINI in the last 168 hours. ------------------------------------------------------------------------------------------------------------------  RADIOLOGY:  Dg Chest 1 View  07/05/2015  CLINICAL DATA:  Bilateral pleural effusions. Status post left thoracentesis. EXAM: CHEST 1 VIEW COMPARISON:  Chest x-rays dated 07/04/2015 and 07/02/2015 FINDINGS: There is no pneumothorax after left thoracentesis. Decreased left pleural effusion although some of fluid remains at the left base. Small right effusion is unchanged. Pulmonary  vascularity is normal. Aortic atherosclerosis. Thoracic scoliosis and osteopenia and degenerative changes of  the shoulders are unchanged. IMPRESSION: No pneumothorax after left thoracentesis. Decreased left pleural effusion. Aortic atherosclerosis. Electronically Signed   By: Lorriane Shire M.D.   On: 07/05/2015 10:00   US Thoracentesis Asp Pleural Space W/img Guide  07/05/2015  INDICATION: Symptomatic left sided pleural effusion - please perform ultrasound-guided thoracentesis for therapeutic and diagnostic purposes. EXAM: US THORACENTESIS ASP PLEURAL SPACE W/IMG GUIDE COMPARISON:  Chest radiograph- 07/04/2015; 07/01/2025 MEDICATIONS: None. COMPLICATIONS: None immediate. TECHNIQUE: Informed written consent was obtained from the patient after a discussion of the risks, benefits and alternatives to treatment. A timeout was performed prior to the initiation of the procedure. Initial ultrasound scanning demonstrates a moderate-sized minimally complex left-sided pleural effusion. The lower chest was prepped and draped in the usual sterile fashion. 1% lidocaine was used for local anesthesia. An ultrasound image was saved for documentation purposes. An 8 Fr Safe-T-Centesis catheter was introduced. The thoracentesis was performed. The catheter was removed and a dressing was applied. The patient tolerated the procedure well without immediate post procedural complication. The patient was escorted to have an upright chest radiograph. FINDINGS: A total of approximately 600 cc of serous fluid was removed. Requested samples were sent to the laboratory. IMPRESSION: Successful ultrasound-guided left sided thoracentesis yielding 600 cc of pleural fluid. Electronically Signed   By: Sandi Mariscal M.D.   On: 07/05/2015 10:14    EKG:   Orders placed or performed during the hospital encounter of 07/02/15  . EKG 12-Lead  . EKG 12-Lead    ASSESSMENT AND PLAN:   [name] with PMH of HTN, Hyperlipidemia who is independent at  baseline presents to the hospital secondary to worsening dyspnea Denies any chest pain or palpitations. Noted to be in A. fib with RVR.  #1 Atrial fibrillation with rvr- rate better controlled;Off the amiodarone drip. Continue oral amiodarone at 400 mg by mouth twice a day today, also on oral metoprolol Rate stable, -Echocardiogram just done last week showing normal ejection fraction. Minimal mitral stenosis.moderate AS> -On Lovenox  therapeutic dose for A. fib.    #2 dyspnea on exertion-worsening over the last month. Likely due to pleural effusion on the left side: Status post thoracocentesis, 600 mL of fluid drained ;follw cytology.cultures are negative to date -CT angiogram done last week did not show any pulmonary embolism.  For dyspnea,pt prefers cardiac cath but  Because of hyponatremia,cardiology prefers to wai ttill sodium is up.  --Incentive spirometer given  #3 GERD-on Protonix  #4 depression-on Zoloft  #5 DVT prophylaxis-on Lovenox   6 .hyponatremia: because of persistent hyponatremia nephrology is consulted,  Physical therapy recommends Home PT 7.Ecoli UTI;R to Levaquin;started on IV Rocephin.;clinically pt feels better.  In 50% of the time spent in counseling and coordination of the care. Discussed with patient and patient's daughter.  All the records are reviewed and case discussed with Care Management/Social Workerr. Management plans discussed with the patient, family and they are in agreement.  CODE STATUS: Full Code  TOTAL TIME TAKING CARE OF THIS PATIENT: 36 minutes.   POSSIBLE D/C IN 2-3 DAYS, DEPENDING ON CLINICAL CONDITION.   Epifanio Lesches M.D on 07/06/2015 at 1:30 PM  Between 7am to 6pm - Pager - [phone]  After 6pm go to [email or web address] - password [email or web address]  Office  [phone]  CC: Primary care physician; [name], MD

## 2015-07-06 NOTE — Consult Note (Signed)
[facility] CONSULT NOTE    Date: 07/06/2015                  Patient Name:  Caroline Aguilar  MRN: [id number]  DOB: 12/22/1925  Age / Sex: 82 y.o., female         PCP: [name], MD                 Service Requesting Consult: Dr. Clayborn Bigness                 Reason for Consult: Hyponatremia            History of Present Illness: Patient is a 82 y.o. female with a PMHx of hyperlipidemia, colon cancer, right carotid artery stenosis, glaucoma, peripheral neuropathy, history of left chest shingles who was admitted to [facility] on 07/02/2015 for evaluation of weakness and shortness of breath.  She was recently admitted [facility] from 06/18/2015 to 06/30/2015. At that point in time she presented with chest pain. She was seen by cardiology at that point in time. She now presents with progressive shortness of breath currently prior to admission. She also had associated nonproductive cough and no fevers or chills. She presented here she was found to be in atrial fibrillation with rapid ventricular response. She was also found to have a pleural effusion on the left and is status post thoracentesis with 600 mL of fluid drained. We are asked to see her for evaluation and management of hyponatremia. Her serum sodium was normal on 06/19/2015 at 138. When she presented now serum sodium was low at 123. Recent 2-D echocardiogram revealed normal ejection fraction. Recent CT scan of the chest was negative for any acute lung lesions.   Medications: Outpatient medications: Prescriptions prior to admission  Medication Sig Dispense Refill Last Dose  . aspirin EC 81 MG tablet Take 81 mg by mouth at bedtime.   07/01/2015 at 2200  . Calcium 600-200 MG-UNIT tablet Take 1 tablet by mouth daily.   07/02/2015 at Unknown time  . docusate sodium (COLACE) 100 MG capsule Take 100 mg by mouth daily.   07/02/2015 at Unknown time  . latanoprost (XALATAN) 0.005 % ophthalmic solution  Place 1 drop into both eyes at bedtime.   07/01/2015 at Unknown time  . metoprolol succinate (TOPROL-XL) 25 MG 24 hr tablet Take 12.5-25 mg by mouth 2 (two) times daily. Pt takes one tablet in the morning and one-half tablet at bedtime.   07/02/2015 at 1200  . pantoprazole (PROTONIX) 40 MG tablet Take 40 mg by mouth daily.   07/02/2015 at Unknown time  . simvastatin (ZOCOR) 20 MG tablet Take 20 mg by mouth at bedtime.   07/01/2015 at Unknown time  . timolol (TIMOPTIC) 0.5 % ophthalmic solution Place 1 drop into both eyes 2 (two) times daily.   07/02/2015 at Unknown time  . triamterene-hydrochlorothiazide (DYAZIDE) 37.5-25 MG capsule Take 1 capsule by mouth daily.   07/01/2015 at Unknown time  . sertraline (ZOLOFT) 50 MG tablet Take 50 mg by mouth at bedtime. Reported on 07/04/2015   Not Taking at Unknown time    Current medications: Current Facility-Administered Medications  Medication Dose Route Frequency Provider Last Rate Last Dose  . 0.9 %  sodium chloride infusion  250 mL Intravenous PRN Yolonda Kida, MD      . Derrill Memo ON 07/07/2015] 0.9% sodium chloride infusion  3 mL/kg/hr Intravenous Continuous Dwayne Prince Rome, MD  Followed by  . [START ON 07/07/2015] 0.9% sodium chloride infusion  1 mL/kg/hr Intravenous Continuous Dwayne D Callwood, MD      . acetaminophen (TYLENOL) tablet 650 mg  650 mg Oral Q6H PRN Lytle Butte, MD   650 mg at 07/05/15 2213   Or  . acetaminophen (TYLENOL) suppository 650 mg  650 mg Rectal Q6H PRN Lytle Butte, MD      . amiodarone (PACERONE) tablet 400 mg  400 mg Oral BID Gladstone Lighter, MD   400 mg at 07/06/15 1006  . [START ON 07/07/2015] aspirin chewable tablet 81 mg  81 mg Oral Pre-Cath Dwayne D Callwood, MD      . calcium-vitamin D (OSCAL WITH D) 500-200 MG-UNIT per tablet 1 tablet  1 tablet Oral Daily Lytle Butte, MD   1 tablet at 07/06/15 1006  . cephALEXin (KEFLEX) capsule 500 mg  500 mg Oral Q12H Epifanio Lesches, MD   500 mg at 07/06/15 1510  .  docusate sodium (COLACE) capsule 100 mg  100 mg Oral Daily Lytle Butte, MD   100 mg at 07/06/15 1006  . enoxaparin (LOVENOX) injection 80 mg  80 mg Subcutaneous Q12H Epifanio Lesches, MD      . latanoprost (XALATAN) 0.005 % ophthalmic solution 1 drop  1 drop Both Eyes QHS Lytle Butte, MD   1 drop at 07/05/15 2216  . metoprolol succinate (TOPROL-XL) 24 hr tablet 12.5 mg  12.5 mg Oral QHS Lytle Butte, MD   12.5 mg at 07/05/15 2213  . metoprolol succinate (TOPROL-XL) 24 hr tablet 25 mg  25 mg Oral q morning - 10a Lytle Butte, MD   25 mg at 07/06/15 1006  . morphine 2 MG/ML injection 2 mg  2 mg Intravenous Q4H PRN Lytle Butte, MD   2 mg at 07/05/15 1236  . ondansetron (ZOFRAN) tablet 4 mg  4 mg Oral Q6H PRN Lytle Butte, MD       Or  . ondansetron [facility]) injection 4 mg  4 mg Intravenous Q6H PRN Lytle Butte, MD      . oxyCODONE (Oxy IR/ROXICODONE) immediate release tablet 5 mg  5 mg Oral Q4H PRN Lytle Butte, MD      . pantoprazole (PROTONIX) EC tablet 40 mg  40 mg Oral Daily Lytle Butte, MD   40 mg at 07/06/15 1006  . sertraline (ZOLOFT) tablet 50 mg  50 mg Oral QHS Lytle Butte, MD   50 mg at 07/04/15 2142  . simethicone (MYLICON) chewable tablet 80 mg  80 mg Oral QID PRN Sylvan Cheese, MD   80 mg at 07/03/15 0051  . simvastatin (ZOCOR) tablet 20 mg  20 mg Oral QHS Lytle Butte, MD   20 mg at 07/05/15 2213  . sodium chloride flush (NS) 0.9 % injection 3 mL  3 mL Intravenous Q12H Dwayne D Callwood, MD   3 mL at 07/06/15 0935  . sodium chloride flush (NS) 0.9 % injection 3 mL  3 mL Intravenous PRN Dwayne D Callwood, MD      . timolol (TIMOPTIC) 0.5 % ophthalmic solution 1 drop  1 drop Both Eyes BID Lytle Butte, MD   1 drop at 07/06/15 0935      Allergies: Allergies  Allergen Reactions  . Penicillins Anaphylaxis and Other (See Comments)    Has patient had a PCN reaction causing immediate rash, facial/tongue/throat swelling, SOB or lightheadedness with hypotension:  Yes Has  patient had a PCN reaction causing severe rash involving mucus membranes or skin necrosis: No Has patient had a PCN reaction that required hospitalization No Has patient had a PCN reaction occurring within the last 10 years: No If all of the above answers are "NO", then may proceed with Cephalosporin use.  . Azithromycin Other (See Comments)    Reaction:  Unknown   . Famciclovir Other (See Comments)    Reaction:  Unknown       Past Medical History: Past Medical History  Diagnosis Date  . Angina pectoris ([place])   . Hyperlipidemia   . Cancer [facility])     Colon cancer     Past Surgical History: Past Surgical History  Procedure Laterality Date  . Hip surgery Bilateral   . Colon surgery    . Cataract extraction Bilateral      Family History: Family History  Problem Relation Age of Onset  . Cancer Mother   . Heart failure Father      Social History: Social History   Social History  . Marital Status: Married    Spouse Name: N/A  . Number of Children: N/A  . Years of Education: N/A   Occupational History  . retired    Social History Main Topics  . Smoking status: Never Smoker   . Smokeless tobacco: Not on file  . Alcohol Use: No  . Drug Use: No  . Sexual Activity: Not on file   Other Topics Concern  . Not on file   Social History Narrative     Review of Systems: As per HPI  Vital Signs: Blood pressure 120/61, pulse 99, temperature 97.9 F (36.6 C), temperature source Oral, resp. rate 18, height 5\' 5"  (1.651 m), weight 77.656 kg (171 lb 3.2 oz), SpO2 95 %.  Weight trends: Filed Weights   07/02/15 1550 07/02/15 2021  Weight: 75.751 kg (167 lb) 77.656 kg (171 lb 3.2 oz)    Physical Exam: General: NAD, resting in bed, appears euvolemic  Head: Normocephalic, atraumatic.  Eyes: Anicteric, EOMI  Nose: Mucous membranes moist, not inflammed, nonerythematous.  Throat: Oropharynx nonerythematous, no exudate appreciated.   Neck: Supple, trachea  midline.  Lungs:  Normal respiratory effort. Scattered rhonchi, normal effort  Heart: S1S2 no rubs  Abdomen:  BS normoactive. Soft, Nondistended, non-tender.  No masses or organomegaly.  Extremities: 1+ b/l LE edema  Neurologic: A&O X3, Motor strength is 5/5 in the all 4 extremities  Skin: No visible rashes, scars.    Lab results: Basic Metabolic Panel:  Recent Labs Lab 07/04/15 0510 07/05/15 0513 07/06/15 0500  NA 126* 125* 122*  K 3.8 4.7 4.0  CL 90* 90* 89*  CO2 29 27 26   GLUCOSE 109* 118* 101*  BUN 20 17 15   CREATININE 0.55 0.46 0.54  CALCIUM 8.2* 8.1* 7.6*    Liver Function Tests: No results for input(s): AST, ALT, ALKPHOS, BILITOT, PROT, ALBUMIN in the last 168 hours. No results for input(s): LIPASE, AMYLASE in the last 168 hours. No results for input(s): AMMONIA in the last 168 hours.  CBC:  Recent Labs Lab 07/02/15 1613 07/03/15 0516 07/04/15 0510  WBC 13.7* 11.4* 9.9  NEUTROABS 11.3*  --   --   HGB 11.7* 11.8* 11.5*  HCT 35.2 34.8* 34.0*  MCV 87.8 89.2 88.8  PLT 383 382 349    Cardiac Enzymes: No results for input(s): CKTOTAL, CKMB, CKMBINDEX, TROPONINI in the last 168 hours.  BNP: Invalid input(s): POCBNP  CBG: No results for  input(s): GLUCAP in the last 168 hours.  Microbiology: Results for orders placed or performed during the hospital encounter of 07/02/15  Blood culture (routine x 2)     Status: None (Preliminary result)   Collection Time: 07/02/15  4:13 PM  Result Value Ref Range Status   Specimen Description BLOOD RT [facility]  Final   Special Requests BOTTLES DRAWN AEROBIC AND ANAEROBIC 5CC  Final   Culture NO GROWTH 4 DAYS  Final   Report Status PENDING  Incomplete  Blood culture (routine x 2)     Status: None (Preliminary result)   Collection Time: 07/02/15  4:14 PM  Result Value Ref Range Status   Specimen Description BLOOD LT AC  Final   Special Requests BOTTLES DRAWN AEROBIC AND ANAEROBIC AER9CC [facility]  Final   Culture NO GROWTH 4  DAYS  Final   Report Status PENDING  Incomplete  Urine culture     Status: None   Collection Time: 07/02/15  6:24 PM  Result Value Ref Range Status   Specimen Description URINE, RANDOM  Final   Special Requests NONE  Final   Culture >=100,000 COLONIES/mL ESCHERICHIA COLI  Final   Report Status 07/05/2015 FINAL  Final   Organism ID, Bacteria ESCHERICHIA COLI  Final      Susceptibility   Escherichia coli - MIC*    AMPICILLIN >=32 RESISTANT Resistant     CEFAZOLIN <=4 SENSITIVE Sensitive     CEFTRIAXONE <=1 SENSITIVE Sensitive     CIPROFLOXACIN >=4 RESISTANT Resistant     GENTAMICIN <=1 SENSITIVE Sensitive     IMIPENEM <=0.25 SENSITIVE Sensitive     NITROFURANTOIN <=16 SENSITIVE Sensitive     TRIMETH/SULFA >=320 RESISTANT Resistant     AMPICILLIN/SULBACTAM 16 INTERMEDIATE Intermediate     PIP/TAZO <=4 SENSITIVE Sensitive     Extended ESBL NEGATIVE Sensitive     * >=100,000 COLONIES/mL ESCHERICHIA COLI  Culture, body fluid-bottle     Status: None (Preliminary result)   Collection Time: 07/05/15 10:05 AM  Result Value Ref Range Status   Specimen Description PLEURAL  Final   Special Requests NONE  Final   Culture NO GROWTH < 24 HOURS  Final   Report Status PENDING  Incomplete    Coagulation Studies: No results for input(s): LABPROT, INR in the last 72 hours.  Urinalysis: No results for input(s): COLORURINE, LABSPEC, PHURINE, GLUCOSEU, HGBUR, BILIRUBINUR, KETONESUR, PROTEINUR, UROBILINOGEN, NITRITE, LEUKOCYTESUR in the last 72 hours.  Invalid input(s): APPERANCEUR    Imaging: Dg Chest 1 View  07/05/2015  CLINICAL DATA:  Bilateral pleural effusions. Status post left thoracentesis. EXAM: CHEST 1 VIEW COMPARISON:  Chest x-rays dated 07/04/2015 and 07/02/2015 FINDINGS: There is no pneumothorax after left thoracentesis. Decreased left pleural effusion although some of fluid remains at the left base. Small right effusion is unchanged. Pulmonary vascularity is normal. Aortic  atherosclerosis. Thoracic scoliosis and osteopenia and degenerative changes of the shoulders are unchanged. IMPRESSION: No pneumothorax after left thoracentesis. Decreased left pleural effusion. Aortic atherosclerosis. Electronically Signed   By: Lorriane Shire M.D.   On: 07/05/2015 10:00   US Thoracentesis Asp Pleural Space W/img Guide  07/05/2015  INDICATION: Symptomatic left sided pleural effusion - please perform ultrasound-guided thoracentesis for therapeutic and diagnostic purposes. EXAM: US THORACENTESIS ASP PLEURAL SPACE W/IMG GUIDE COMPARISON:  Chest radiograph- 07/04/2015; 07/01/2025 MEDICATIONS: None. COMPLICATIONS: None immediate. TECHNIQUE: Informed written consent was obtained from the patient after a discussion of the risks, benefits and alternatives to treatment. A timeout was performed prior to  the initiation of the procedure. Initial ultrasound scanning demonstrates a moderate-sized minimally complex left-sided pleural effusion. The lower chest was prepped and draped in the usual sterile fashion. 1% lidocaine was used for local anesthesia. An ultrasound image was saved for documentation purposes. An 8 Fr Safe-T-Centesis catheter was introduced. The thoracentesis was performed. The catheter was removed and a dressing was applied. The patient tolerated the procedure well without immediate post procedural complication. The patient was escorted to have an upright chest radiograph. FINDINGS: A total of approximately 600 cc of serous fluid was removed. Requested samples were sent to the laboratory. IMPRESSION: Successful ultrasound-guided left sided thoracentesis yielding 600 cc of pleural fluid. Electronically Signed   By: Sandi Mariscal M.D.   On: 07/05/2015 10:14      Assessment & Plan: Pt is a 82 y.o. female with a PMHx of hyperlipidemia, colon cancer, right carotid artery stenosis, glaucoma, peripheral neuropathy, history of left chest shingles who was admitted to [facility] on 07/02/2015 for  evaluation of weakness and shortness of breath.   1.  Acute hyponatremia, suspect SIADH. 2.  A. Fib with RVR 3.  Left pleural effusion s/p thoracentesis 07/05/15  Plan:  The patient presents with an interesting case. She is found to have acute hyponatremia which appears to be worsening. She was on IV fluid hydration which made the symptoms sodium worse. To suggest underlying SIADH. We will proceed with serum and urine osmolality, TSH, cortisone, SPEP, and UPEP. She is already had chest imaging during the last admission which was negative for lung lesions which at times can cause SIADH. We'll place the patient on fluid restriction and also start her on Lasix 20 mg by mouth daily in addition to salt tablets 1 g by mouth twice a day. If this is ineffective we will consider tolvaptan.  Would hold off on cardiac catherization for now.

## 2015-07-06 NOTE — Progress Notes (Signed)
Per Dr. Clayborn Bigness, patient can have hot, clear liquid tray for breakfast and can take morning meds with a bite of applesauce.

## 2015-07-06 NOTE — Progress Notes (Signed)
Per Dr. Clayborn Bigness, cath will not be today. Orders to resume regular diet.

## 2015-07-06 NOTE — Care Management Note (Signed)
Case Management Note  Patient Details  Name: Caroline Aguilar MRN: [id number] Date of Birth: 11/26/1925  Subjective/Objective:  Met with patient and her daughter in law at bedside to discuss discharge planning. PT recommending home PT. Would benefit from a nurse. Patient and family agreeable. No agency preference. Referral to [place] for nursing and PT.  Patient lives at home alone. She uses a walker but is able to provide all her own adl's. Denies issues obtaining medical care, medications or transportation.                  Action/Plan: Will monitor progress  Expected Discharge Date:                  Expected Discharge Plan:  [place]  In-House Referral:     Discharge planning Services  CM Consult  Post Acute Care Choice:  Home Health Choice offered to:  Patient (daughter inlaw)  DME Arranged:    DME Agency:     HH Arranged:  RN, PT [place] Agency:  [place]  Status of Service:  In process, will continue to follow  Medicare Important Message Given:  Yes Date Medicare IM Given:    Medicare IM give by:    Date Additional Medicare IM Given:    Additional Medicare Important Message give by:     If discussed at [place] of Stay Meetings, dates discussed:    Additional Comments:  Jolly Mango, RN 07/06/2015, 3:18 PM

## 2015-07-07 LAB — PROTEIN ELECTROPHORESIS, SERUM
A/G Ratio: 0.7 (ref 0.7–1.7)
ALBUMIN ELP: 2.3 g/dL — AB (ref 2.9–4.4)
ALPHA-2-GLOBULIN: 1 g/dL (ref 0.4–1.0)
Alpha-1-Globulin: 0.4 g/dL (ref 0.0–0.4)
BETA GLOBULIN: 0.9 g/dL (ref 0.7–1.3)
GAMMA GLOBULIN: 0.8 g/dL (ref 0.4–1.8)
Globulin, Total: 3.1 g/dL (ref 2.2–3.9)
TOTAL PROTEIN ELP: 5.4 g/dL — AB (ref 6.0–8.5)

## 2015-07-07 LAB — CBC
HEMATOCRIT: 37.4 % (ref 35.0–47.0)
HEMOGLOBIN: 12.7 g/dL (ref 12.0–16.0)
MCH: 29.8 pg (ref 26.0–34.0)
MCHC: 34 g/dL (ref 32.0–36.0)
MCV: 87.5 fL (ref 80.0–100.0)
Platelets: 460 10*3/uL — ABNORMAL HIGH (ref 150–440)
RBC: 4.28 MIL/uL (ref 3.80–5.20)
RDW: 12.7 % (ref 11.5–14.5)
WBC: 7.6 10*3/uL (ref 3.6–11.0)

## 2015-07-07 LAB — CULTURE, BLOOD (ROUTINE X 2)
CULTURE: NO GROWTH
CULTURE: NO GROWTH

## 2015-07-07 LAB — BASIC METABOLIC PANEL
ANION GAP: 10 (ref 5–15)
BUN: 11 mg/dL (ref 6–20)
CALCIUM: 8.3 mg/dL — AB (ref 8.9–10.3)
CHLORIDE: 89 mmol/L — AB (ref 101–111)
CO2: 29 mmol/L (ref 22–32)
Creatinine, Ser: 0.62 mg/dL (ref 0.44–1.00)
GFR calc Af Amer: >60 mL/min (ref >60)
GFR calc non Af Amer: >60 mL/min (ref >60)
GLUCOSE: 146 mg/dL — AB (ref 65–99)
POTASSIUM: 3.5 mmol/L (ref 3.5–5.1)
Sodium: 128 mmol/L — ABNORMAL LOW (ref 135–145)

## 2015-07-07 LAB — CORTISOL: Cortisol, Plasma: 16.9 ug/dL

## 2015-07-07 LAB — PATHOLOGIST SMEAR REVIEW

## 2015-07-07 MED ORDER — ENSURE ENLIVE PO LIQD
237.0000 mL | Freq: Two times a day (BID) | ORAL | Status: DC
  Administered 2015-07-08: 237 mL via ORAL

## 2015-07-07 MED ORDER — POTASSIUM CHLORIDE 20 MEQ PO PACK
40.0000 meq | PACK | Freq: Once | ORAL | Status: AC
  Administered 2015-07-07: 40 meq via ORAL
  Filled 2015-07-07: qty 2

## 2015-07-07 NOTE — Progress Notes (Signed)
[facility]  ROUNDING NOTE   Subjective:  Sodium up to 128. Resting in bed comfortably.    Objective:  Vital signs in last 24 hours:  Temp:  [97.7 F (36.5 C)-98.3 F (36.8 C)] 97.7 F (36.5 C) (03/22 1154) Pulse Rate:  [71-104] 87 (03/22 1154) Resp:  [18-20] 20 (03/22 1154) BP: (106-152)/(53-70) 106/61 mmHg (03/22 1154) SpO2:  [95 %-98 %] 97 % (03/22 1154)  Weight change:  Filed Weights   07/02/15 1550 07/02/15 2021  Weight: 75.751 kg (167 lb) 77.656 kg (171 lb 3.2 oz)    Intake/Output: I/O last 3 completed shifts: In: 240 [P.O.:240] Out: 400 [Urine:400]   Intake/Output this shift:  Total I/O In: 480 [P.O.:480] Out: 250 [Urine:250]  Physical Exam: General: NAD, resting in bed  Head: Normocephalic, atraumatic. Moist oral mucosal membranes  Eyes: Anicteric  Neck: Supple, trachea midline  Lungs:  Clear to auscultation  Heart: S1S2 no rubs  Abdomen:  Soft, nontender, BS present  Extremities: 1+ peripheral edema.  Neurologic: Nonfocal, moving all four extremities  Skin: No lesions       Basic Metabolic Panel:  Recent Labs Lab 07/03/15 0516 07/04/15 0510 07/05/15 0513 07/06/15 0500 07/07/15 0925  NA 126* 126* 125* 122* 128*  K 3.9 3.8 4.7 4.0 3.5  CL 91* 90* 90* 89* 89*  CO2 27 29 27 26 29   GLUCOSE 132* 109* 118* 101* 146*  BUN 18 20 17 15 11   CREATININE 0.58 0.55 0.46 0.54 0.62  CALCIUM 8.3* 8.2* 8.1* 7.6* 8.3*    Liver Function Tests: No results for input(s): AST, ALT, ALKPHOS, BILITOT, PROT, ALBUMIN in the last 168 hours. No results for input(s): LIPASE, AMYLASE in the last 168 hours. No results for input(s): AMMONIA in the last 168 hours.  CBC:  Recent Labs Lab 07/02/15 1613 07/03/15 0516 07/04/15 0510 07/07/15 0925  WBC 13.7* 11.4* 9.9 7.6  NEUTROABS 11.3*  --   --   --   HGB 11.7* 11.8* 11.5* 12.7  HCT 35.2 34.8* 34.0* 37.4  MCV 87.8 89.2 88.8 87.5  PLT 383 382 349 460*    Cardiac Enzymes: No results for  input(s): CKTOTAL, CKMB, CKMBINDEX, TROPONINI in the last 168 hours.  BNP: Invalid input(s): POCBNP  CBG: No results for input(s): GLUCAP in the last 168 hours.  Microbiology: Results for orders placed or performed during the hospital encounter of 07/02/15  Blood culture (routine x 2)     Status: None   Collection Time: 07/02/15  4:13 PM  Result Value Ref Range Status   Specimen Description BLOOD RT [facility]  Final   Special Requests BOTTLES DRAWN AEROBIC AND ANAEROBIC 5CC  Final   Culture NO GROWTH 5 DAYS  Final   Report Status 07/07/2015 FINAL  Final  Blood culture (routine x 2)     Status: None   Collection Time: 07/02/15  4:14 PM  Result Value Ref Range Status   Specimen Description BLOOD LT [facility]  Final   Special Requests BOTTLES DRAWN AEROBIC AND ANAEROBIC AER9CC [facility]  Final   Culture NO GROWTH 5 DAYS  Final   Report Status 07/07/2015 FINAL  Final  Urine culture     Status: None   Collection Time: 07/02/15  6:24 PM  Result Value Ref Range Status   Specimen Description URINE, RANDOM  Final   Special Requests NONE  Final   Culture >=100,000 COLONIES/mL ESCHERICHIA COLI  Final   Report Status 07/05/2015 FINAL  Final   Organism ID, Bacteria  ESCHERICHIA COLI  Final      Susceptibility   Escherichia coli - MIC*    AMPICILLIN >=32 RESISTANT Resistant     CEFAZOLIN <=4 SENSITIVE Sensitive     CEFTRIAXONE <=1 SENSITIVE Sensitive     CIPROFLOXACIN >=4 RESISTANT Resistant     GENTAMICIN <=1 SENSITIVE Sensitive     IMIPENEM <=0.25 SENSITIVE Sensitive     NITROFURANTOIN <=16 SENSITIVE Sensitive     TRIMETH/SULFA >=320 RESISTANT Resistant     AMPICILLIN/SULBACTAM 16 INTERMEDIATE Intermediate     PIP/TAZO <=4 SENSITIVE Sensitive     Extended ESBL NEGATIVE Sensitive     * >=100,000 COLONIES/mL ESCHERICHIA COLI  Culture, body fluid-bottle     Status: None (Preliminary result)   Collection Time: 07/05/15 10:05 AM  Result Value Ref Range Status   Specimen Description PLEURAL  Final    Special Requests NONE  Final   Culture NO GROWTH 2 DAYS  Final   Report Status PENDING  Incomplete    Coagulation Studies: No results for input(s): LABPROT, INR in the last 72 hours.  Urinalysis: No results for input(s): COLORURINE, LABSPEC, PHURINE, GLUCOSEU, HGBUR, BILIRUBINUR, KETONESUR, PROTEINUR, UROBILINOGEN, NITRITE, LEUKOCYTESUR in the last 72 hours.  Invalid input(s): APPERANCEUR    Imaging: No results found.   Medications:     . amiodarone  400 mg Oral BID  . calcium-vitamin D  1 tablet Oral Daily  . cephALEXin  500 mg Oral Q12H  . docusate sodium  100 mg Oral Daily  . enoxaparin (LOVENOX) injection  80 mg Subcutaneous Q12H  . feeding supplement (ENSURE ENLIVE)  237 mL Oral BID BM  . furosemide  20 mg Oral Daily  . latanoprost  1 drop Both Eyes QHS  . metoprolol succinate  12.5 mg Oral QHS  . metoprolol succinate  25 mg Oral q morning - 10a  . pantoprazole  40 mg Oral Daily  . sertraline  50 mg Oral QHS  . simvastatin  20 mg Oral QHS  . sodium chloride  1 g Oral BID WC  . timolol  1 drop Both Eyes BID   acetaminophen **OR** acetaminophen, morphine injection, ondansetron **OR** ondansetron (ZOFRAN) IV, oxyCODONE, simethicone  Assessment/ Plan:  82 y.o. female with a PMHx of hyperlipidemia, colon cancer, right carotid artery stenosis, glaucoma, peripheral neuropathy, history of left chest shingles who was admitted to [facility] on 07/02/2015 for evaluation of weakness and shortness of breath.   1. Acute hyponatremia, suspect SIADH. 2. A. Fib with RVR 3. 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  She has responded nicely to Lasix and sodium chloride  Continue this one additional day.  Hopefully her SIADH will resolve in the relative near future.  LOS: 5 Estelle Skibicki 3/22/20172:15 PM

## 2015-07-07 NOTE — Progress Notes (Signed)
Dr. Vianne Bulls made aware of patient's esophageal strictures as mentioned per pt. MD to assess and place orders as needed.

## 2015-07-07 NOTE — Progress Notes (Signed)
Patient can have ensure between meals per Dr. Vianne Bulls.

## 2015-07-07 NOTE — Care Management Important Message (Signed)
Important Message  Patient Details  Name: Caroline Aguilar MRN: [id number] Date of Birth: October 07, 1925   Medicare Important Message Given:  Yes    Juliann Pulse A Mitsuye Schrodt 07/07/2015, 1:39 PM

## 2015-07-07 NOTE — Progress Notes (Signed)
at [place] NAME: Caroline Aguilar    MR#:  [id number]  DATE OF BIRTH:  Aug 17, 1925  SUBJECTIVE: Seen today patient complains of food sticking in throat and said she says that it happens at home all the time and she says she is due for stretching of esophagus. otherwise feels better.  CHIEF COMPLAINT:   Chief Complaint  Patient presents with  . Weakness  . Shortness of Breath   -  REVIEW OF SYSTEMS:  Review of Systems  Constitutional: Negative for fever and chills.  HENT: Negative for ear discharge, ear pain and nosebleeds.   Eyes: Negative for blurred vision and double vision.  Respiratory: Negative for cough, shortness of breath and wheezing.   Cardiovascular: Negative for chest pain, palpitations and leg swelling.  Gastrointestinal: Negative for nausea, vomiting, abdominal pain, diarrhea and constipation.  Genitourinary: Negative for dysuria.  Musculoskeletal: Negative for myalgias.  Neurological: Negative for dizziness, tremors, sensory change, speech change, focal weakness, seizures and headaches.  Psychiatric/Behavioral: Negative for depression.    DRUG ALLERGIES:   Allergies  Allergen Reactions  . Penicillins Anaphylaxis and Other (See Comments)    Has patient had a PCN reaction causing immediate rash, facial/tongue/throat swelling, SOB or lightheadedness with hypotension: Yes Has patient had a PCN reaction causing severe rash involving mucus membranes or skin necrosis: No Has patient had a PCN reaction that required hospitalization No Has patient had a PCN reaction occurring within the last 10 years: No If all of the above answers are "NO", then may proceed with Cephalosporin use.  . Azithromycin Other (See Comments)    Reaction:  Unknown   . Famciclovir Other (See Comments)    Reaction:  Unknown     VITALS:  Blood pressure 131/70, pulse 104, temperature 97.9 F (36.6 C), temperature source Oral, resp. rate  19, height 5\' 5"  (1.651 m), weight 77.656 kg (171 lb 3.2 oz), SpO2 95 %.  PHYSICAL EXAMINATION:  Physical Exam  GENERAL:  82 y.o.-year-old patient lying in the bed with no acute distress.  EYES: Pupils equal, round, reactive to light and accommodation. No scleral icterus. Extraocular muscles intact.  HEENT: Head atraumatic, normocephalic. Oropharynx and nasopharynx clear.  NECK:  Supple, no jugular venous distention. No thyroid enlargement, no tenderness.  LUNGS:Bilaterally clear to auscultation.No use of accessory muscles of respiration.  CARDIOVASCULAR: S1, S2 normal. No rubs, or gallops.ESM in aortic area. ABDOMEN: Soft, nontender, nondistended. Bowel sounds present. No organomegaly or mass.  EXTREMITIES: No pedal edema, cyanosis, or clubbing.  NEUROLOGIC: Cranial nerves II through XII are intact. Muscle strength 5/5 in all extremities. Sensation intact. Gait not checked.  PSYCHIATRIC: [name].  SKIN: No obvious rash, lesion, or ulcer.    LABORATORY PANEL:   CBC  Recent Labs Lab 07/04/15 0510  WBC 9.9  HGB 11.5*  HCT 34.0*  PLT 349   ------------------------------------------------------------------------------------------------------------------  Chemistries   Recent Labs Lab 07/07/15 0925  NA 128*  K 3.5  CL 89*  CO2 29  GLUCOSE 146*  BUN 11  CREATININE 0.62  CALCIUM 8.3*   ------------------------------------------------------------------------------------------------------------------  Cardiac Enzymes No results for input(s): TROPONINI in the last 168 hours. ------------------------------------------------------------------------------------------------------------------  RADIOLOGY:  No results found.  EKG:   Orders placed or performed during the hospital encounter of 07/02/15  . EKG 12-Lead  . EKG 12-Lead    ASSESSMENT AND PLAN:   16 y/oF with PMH of HTN, Hyperlipidemia who is independent  at baseline presents to the  hospital secondary to worsening dyspnea Denies any chest pain or palpitations. Noted to be in A. fib with RVR.  #1 Atrial fibrillation with rvr- rate better controlled;Off the amiodarone drip. Continue oral amiodarone at 400 mg by mouth twice a day today, also on oral metoprolol Rate stable, -Echocardiogram just done last week showing normal ejection fraction. Minimal mitral stenosis.moderate AS> -On Lovenox  therapeutic dose for A. fib.  Start Eliquis  at discharge  #2 dyspnea on exertion-worsening over the last month. Likely due to pleural effusion on the left side: Status post thoracocentesis, 600 mL of fluid drained ; has transudative pleural effusion ,negative for malignancy. Cultures are negative. -CT angiogram done last week did not show any pulmonary embolism .  For dyspnea,pt prefers cardiac cath but  Because of hyponatremia,cardiology prefers to wai ttill sodium is up. According to   Dr. Pete Pelt catheter can be done as an outpatient and patient is okay to have it as an outpatient. --Incentive spirometer given'  #3 GERD-on Protonix  #4 depression-on Zoloft  #5 DVT prophylaxis-on Lovenox   6 .hyponatremia:  Likely secondary to SIADH: Improved with fluid restriction, salt tablets. Sodium improved to 128 today. Physical therapy recommends Home PT 7.Ecoli UTI;R to Levaquin;started on IV Rocephin.;clinically pt feels better. #8 dysphagia possible esophageal stricture: Consult gastroenterology for further evaluation. Change the diet to soft diet.  In 50% of the time spent in counseling and coordination of the care. Discussed with patient and patient's daughter.  All the records are reviewed and case discussed with Care Management/Social Workerr. Management plans discussed with the patient, family and they are in agreement.  CODE STATUS: Full Code  TOTAL TIME TAKING CARE OF THIS PATIENT: 36 minutes.   POSSIBLE D/C IN 2-3 DAYS, DEPENDING ON CLINICAL  CONDITION.   Epifanio Lesches M.D on 07/07/2015 at 11:40 AM  Between 7am to 6pm - Pager - [phone]  After 6pm go to [email or web address] - password [email or web address] [facility]  Office  [phone]  CC: Primary care physician; [name], MD

## 2015-07-07 NOTE — Consult Note (Signed)
Reason for Consult: History of Esophageal Stricture Referring Physician: Dr. Kanadini  Caroline Aguilar is an 82 y.o. female admitted with acute respiratory insufficiency and atrial fibrillation.   HPI:  History of intermittent dysphagia for years. History of esophageal stricture with dilation. Reports having difficulty with breakfast this morning. Felt like she was choking. GI has been consulted to evaluate for EGD with dilation.   Seen by Dawn Harrison, NP in February of 2013 for evaluation of Dysphagia and scheduled for an EGD that she had to cancelled. Prior to that she had an EGD per Dr. Siegel on 03/25/02 for evaluation of Dysphagia. Esophagus, stomach and examined duodenum was normal. No stricture noted but esophagus was successfully dilated.   Past Medical History  Diagnosis Date  . Angina pectoris (HCC)   . Hyperlipidemia   . Cancer (HCC)     Colon cancer    Past Surgical History  Procedure Laterality Date  . Hip surgery Bilateral   . Colon surgery    . Cataract extraction Bilateral     Family History  Problem Relation Age of Onset  . Cancer Mother   . Heart failure Father     Social History:  reports that she has never smoked. She does not have any smokeless tobacco history on file. She reports that she does not drink alcohol or use illicit drugs.  Allergies:  Allergies  Allergen Reactions  . Penicillins Anaphylaxis and Other (See Comments)    Has patient had a PCN reaction causing immediate rash, facial/tongue/throat swelling, SOB or lightheadedness with hypotension: Yes Has patient had a PCN reaction causing severe rash involving mucus membranes or skin necrosis: No Has patient had a PCN reaction that required hospitalization No Has patient had a PCN reaction occurring within the last 10 years: No If all of the above answers are "NO", then may proceed with Cephalosporin use.  . Azithromycin Other (See Comments)    Reaction:  Unknown   . Famciclovir Other (See  Comments)    Reaction:  Unknown     Medications:  I have reviewed the patient's current medications. Prior to Admission:  Prescriptions prior to admission  Medication Sig Dispense Refill Last Dose  . aspirin EC 81 MG tablet Take 81 mg by mouth at bedtime.   07/01/2015 at 2200  . Calcium 600-200 MG-UNIT tablet Take 1 tablet by mouth daily.   07/02/2015 at Unknown time  . docusate sodium (COLACE) 100 MG capsule Take 100 mg by mouth daily.   07/02/2015 at Unknown time  . latanoprost (XALATAN) 0.005 % ophthalmic solution Place 1 drop into both eyes at bedtime.   07/01/2015 at Unknown time  . metoprolol succinate (TOPROL-XL) 25 MG 24 hr tablet Take 12.5-25 mg by mouth 2 (two) times daily. Pt takes one tablet in the morning and one-half tablet at bedtime.   07/02/2015 at 1200  . pantoprazole (PROTONIX) 40 MG tablet Take 40 mg by mouth daily.   07/02/2015 at Unknown time  . simvastatin (ZOCOR) 20 MG tablet Take 20 mg by mouth at bedtime.   07/01/2015 at Unknown time  . timolol (TIMOPTIC) 0.5 % ophthalmic solution Place 1 drop into both eyes 2 (two) times daily.   07/02/2015 at Unknown time  . triamterene-hydrochlorothiazide (DYAZIDE) 37.5-25 MG capsule Take 1 capsule by mouth daily.   07/01/2015 at Unknown time  . sertraline (ZOLOFT) 50 MG tablet Take 50 mg by mouth at bedtime. Reported on 07/04/2015   Not Taking at Unknown time   Scheduled: .   amiodarone  400 mg Oral BID  . calcium-vitamin D  1 tablet Oral Daily  . cephALEXin  500 mg Oral Q12H  . docusate sodium  100 mg Oral Daily  . enoxaparin (LOVENOX) injection  80 mg Subcutaneous Q12H  . feeding supplement (ENSURE ENLIVE)  237 mL Oral BID BM  . furosemide  20 mg Oral Daily  . latanoprost  1 drop Both Eyes QHS  . metoprolol succinate  12.5 mg Oral QHS  . metoprolol succinate  25 mg Oral q morning - 10a  . pantoprazole  40 mg Oral Daily  . sertraline  50 mg Oral QHS  . simvastatin  20 mg Oral QHS  . sodium chloride  1 g Oral BID WC  . timolol  1  drop Both Eyes BID   Continuous:  IHK:[id number] **OR** acetaminophen, morphine injection, ondansetron **OR** ondansetron (ZOFRAN) IV, oxyCODONE, simethicone Anti-infectives    Start     Dose/Rate Route Frequency Ordered Stop   07/06/15 1800  cefTRIAXone (ROCEPHIN) 1 g in dextrose 5 % 50 mL IVPB  Status:  Discontinued     1 g 100 mL/hr over 30 Minutes Intravenous Every 24 hours 07/06/15 1141 07/06/15 1506   07/06/15 1515  cephALEXin (KEFLEX) capsule 500 mg     500 mg Oral Every 12 hours 07/06/15 1506     07/05/15 1600  cefTRIAXone (ROCEPHIN) 1 g in dextrose 5 % 50 mL IVPB  Status:  Discontinued     1 g 100 mL/hr over 30 Minutes Intravenous Every 24 hours 07/05/15 1524 07/06/15 1141   07/02/15 2000  ciprofloxacin (CIPRO) tablet 500 mg  Status:  Discontinued     500 mg Oral 2 times daily 07/02/15 1939 07/02/15 2028   07/02/15 1915  levofloxacin (LEVAQUIN) IVPB 750 mg  Status:  Discontinued     750 mg 100 mL/hr over 90 Minutes Intravenous  Once 07/02/15 1902 07/02/15 1932   07/02/15 1915  levofloxacin (LEVAQUIN) IVPB 500 mg  Status:  Discontinued     500 mg 100 mL/hr over 60 Minutes Intravenous Every 24 hours 07/02/15 1903 07/05/15 1524      Results for orders placed or performed during the hospital encounter of 07/02/15 (from the past 48 hour(s))  Procalcitonin     Status: None   Collection Time: 07/06/15  5:00 AM  Result Value Ref Range   Procalcitonin <0.10 ng/mL    Comment:        Interpretation: PCT (Procalcitonin) <= 0.5 ng/mL: Systemic infection (sepsis) is not likely. Local bacterial infection is possible. (NOTE)         ICU PCT Algorithm               Non ICU PCT Algorithm    ----------------------------     ------------------------------         PCT < 0.25 ng/mL                 PCT < 0.1 ng/mL     Stopping of antibiotics            Stopping of antibiotics       strongly encouraged.               strongly encouraged.    ----------------------------      ------------------------------       PCT level decrease by               PCT < 0.25 ng/mL       >= 80% from  peak PCT       OR PCT 0.25 - 0.5 ng/mL          Stopping of antibiotics                                             encouraged.     Stopping of antibiotics           encouraged.    ----------------------------     ------------------------------       PCT level decrease by              PCT >= 0.25 ng/mL       < 80% from peak PCT        AND PCT >= 0.5 ng/mL            Continuin g antibiotics                                              encouraged.       Continuing antibiotics            encouraged.    ----------------------------     ------------------------------     PCT level increase compared          PCT > 0.5 ng/mL         with peak PCT AND          PCT >= 0.5 ng/mL             Escalation of antibiotics                                          strongly encouraged.      Escalation of antibiotics        strongly encouraged.   Basic metabolic panel     Status: Abnormal   Collection Time: 07/06/15  5:00 AM  Result Value Ref Range   Sodium 122 (L) 135 - 145 mmol/L   Potassium 4.0 3.5 - 5.1 mmol/L   Chloride 89 (L) 101 - 111 mmol/L   CO2 26 22 - 32 mmol/L   Glucose, Bld 101 (H) 65 - 99 mg/dL   BUN 15 6 - 20 mg/dL   Creatinine, Ser 0.54 0.44 - 1.00 mg/dL   Calcium 7.6 (L) 8.9 - 10.3 mg/dL   GFR calc non Af Amer >60 >60 mL/min   GFR calc Af Amer >60 >60 mL/min    Comment: (NOTE) The eGFR has been calculated using the CKD EPI equation. This calculation has not been validated in all clinical situations. eGFR's persistently <60 mL/min signify possible Chronic Kidney Disease.    Anion gap 7 5 - 15  Sodium, urine, random     Status: None   Collection Time: 07/06/15  5:21 PM  Result Value Ref Range   Sodium, Ur 19 mmol/L  Creatinine, urine, random     Status: None   Collection Time: 07/06/15  5:21 PM  Result Value Ref Range   Creatinine, Urine 120 mg/dL  Osmolality,  urine     Status: None   Collection Time: 07/06/15  5:21 PM  Result Value Ref Range   Osmolality, Ur 655   300 - 900 mOsm/kg  Protein electrophoresis, serum     Status: Abnormal   Collection Time: 07/06/15  5:29 PM  Result Value Ref Range   Total Protein ELP 5.4 (L) 6.0 - 8.5 g/dL   Albumin ELP 2.3 (L) 2.9 - 4.4 g/dL   Alpha-1-Globulin 0.4 0.0 - 0.4 g/dL   Alpha-2-Globulin 1.0 0.4 - 1.0 g/dL   Beta Globulin 0.9 0.7 - 1.3 g/dL   Gamma Globulin 0.8 0.4 - 1.8 g/dL   M-Spike, % Not Observed Not Observed g/dL   SPE Interp. Comment     Comment: (NOTE) The SPE pattern reflects hypoalbuminemia. Evidence of monoclonal protein is not apparent. Performed At: [facility] [address] [place], [place] [id number] [place] William F MD Ph:[phone]    Comment Comment     Comment: (NOTE) Protein electrophoresis scan will follow via computer, mail, or courier delivery.    GLOBULIN, TOTAL 3.1 2.2 - 3.9 g/dL   A/G Ratio 0.7 0.7 - 1.7  TSH     Status: Abnormal   Collection Time: 07/06/15  5:29 PM  Result Value Ref Range   TSH 5.002 (H) 0.350 - 4.500 uIU/mL  Osmolality     Status: Abnormal   Collection Time: 07/06/15  5:29 PM  Result Value Ref Range   Osmolality 266 (L) 275 - 295 mOsm/kg  Uric acid     Status: None   Collection Time: 07/06/15  5:29 PM  Result Value Ref Range   Uric Acid, Serum 4.0 2.3 - 6.6 mg/dL  Cortisol     Status: None   Collection Time: 07/07/15  9:25 AM  Result Value Ref Range   Cortisol, Plasma 16.9 ug/dL    Comment: (NOTE) AM    6.7 - 22.6 ug/dL PM   <10.0       ug/dL Performed at [facility]   Basic metabolic panel     Status: Abnormal   Collection Time: 07/07/15  9:25 AM  Result Value Ref Range   Sodium 128 (L) 135 - 145 mmol/L   Potassium 3.5 3.5 - 5.1 mmol/L   Chloride 89 (L) 101 - 111 mmol/L   CO2 29 22 - 32 mmol/L   Glucose, Bld 146 (H) 65 - 99 mg/dL   BUN 11 6 - 20 mg/dL   Creatinine, Ser 0.62 0.44 - 1.00 mg/dL   Calcium 8.3 (L)  8.9 - 10.3 mg/dL   GFR calc non Af Amer >60 >60 mL/min   GFR calc Af Amer >60 >60 mL/min    Comment: (NOTE) The eGFR has been calculated using the CKD EPI equation. This calculation has not been validated in all clinical situations. eGFR's persistently <60 mL/min signify possible Chronic Kidney Disease.    Anion gap 10 5 - 15  CBC     Status: Abnormal   Collection Time: 07/07/15  9:25 AM  Result Value Ref Range   WBC 7.6 3.6 - 11.0 K/uL   RBC 4.28 3.80 - 5.20 MIL/uL   Hemoglobin 12.7 12.0 - 16.0 g/dL   HCT 37.4 35.0 - 47.0 %   MCV 87.5 80.0 - 100.0 fL   MCH 29.8 26.0 - 34.0 pg   MCHC 34.0 32.0 - 36.0 g/dL   RDW 12.7 11.5 - 14.5 %   Platelets 460 (H) 150 - 440 K/uL    No results found.  Review of Systems  Constitutional: Positive for malaise/fatigue.  HENT: Negative.   Eyes: Negative.   Respiratory: Negative.   Cardiovascular: Negative.     Gastrointestinal:       Difficulty swallowing.   Genitourinary: Negative.   Musculoskeletal: Positive for falls.       History of falls. Fractured right hip in 2016 s/p fall.   Skin: Negative.   Neurological: Negative.   Endo/Heme/Allergies: Negative.   Psychiatric/Behavioral: Negative.    Blood pressure 156/61, pulse 65, temperature 97.7 F (36.5 C), temperature source Oral, resp. rate 18, height 5' 5" (1.651 m), weight 77.656 kg (171 lb 3.2 oz), SpO2 98 %. Physical Exam  Nursing note and vitals reviewed. Constitutional: She is oriented to person, place, and time. She appears well-developed and well-nourished. No distress.  Eyes: No scleral icterus.  Neck: Normal range of motion. Neck supple. No JVD present. No tracheal deviation present. No thyromegaly present.  Cardiovascular: Normal rate, regular rhythm and intact distal pulses.  Exam reveals friction rub. Exam reveals no gallop.   No murmur heard. Respiratory: Effort normal and breath sounds normal. No stridor. No respiratory distress. She has no wheezes. She has no rales. She  exhibits no tenderness.  GI: Soft. Bowel sounds are normal. She exhibits no distension and no mass. There is no tenderness. There is no rebound and no guarding.  Musculoskeletal: She exhibits no edema or tenderness.  Lymphadenopathy:    She has no cervical adenopathy.  Neurological: She is alert and oriented to person, place, and time.  Skin: Skin is warm and dry. She is not diaphoretic.    Assessment/Plan: 1. Dysphagia with a history of esophageal stricture. EGD scheduled for tomorrow.   Patient seen in collaboration with Dr. Verdie Shire.  Thank you for consultation. Please call with any questions or concerns.   Faye Ramsay, MSN, FNP-BC 07/07/2015, 5:13 PM

## 2015-07-08 ENCOUNTER — Encounter
Admission: EM | Disposition: A | Discharge status: Home Health | Payer: Self-pay | Source: Home / Self Care | Attending: Internal Medicine

## 2015-07-08 ENCOUNTER — Inpatient Hospital Stay: Payer: Commercial Managed Care - HMO | Admitting: Anesthesiology

## 2015-07-08 ENCOUNTER — Encounter: Payer: Self-pay | Admitting: Gastroenterology

## 2015-07-08 HISTORY — PX: ESOPHAGOGASTRODUODENOSCOPY: SHX5428

## 2015-07-08 LAB — PROTEIN ELECTRO, RANDOM URINE
ALBUMIN ELP UR: 26.7 %
Alpha-1-Globulin, U: 1.3 %
Alpha-2-Globulin, U: 2.9 %
Beta Globulin, U: 34.4 %
GAMMA GLOBULIN, U: 34.6 %
Total Protein, Urine: 30.7 mg/dL

## 2015-07-08 LAB — BASIC METABOLIC PANEL
ANION GAP: 5 (ref 5–15)
BUN: 10 mg/dL (ref 6–20)
CHLORIDE: 91 mmol/L — AB (ref 101–111)
CO2: 33 mmol/L — ABNORMAL HIGH (ref 22–32)
CREATININE: 0.51 mg/dL (ref 0.44–1.00)
Calcium: 7.8 mg/dL — ABNORMAL LOW (ref 8.9–10.3)
GFR calc non Af Amer: >60 mL/min (ref >60)
Glucose, Bld: 102 mg/dL — ABNORMAL HIGH (ref 65–99)
POTASSIUM: 3.6 mmol/L (ref 3.5–5.1)
SODIUM: 129 mmol/L — AB (ref 135–145)

## 2015-07-08 SURGERY — EGD (ESOPHAGOGASTRODUODENOSCOPY)
Anesthesia: General

## 2015-07-08 MED ORDER — PROPOFOL 10 MG/ML IV BOLUS
INTRAVENOUS | Status: DC | PRN
  Administered 2015-07-08: 40 mg via INTRAVENOUS

## 2015-07-08 MED ORDER — SODIUM CHLORIDE 0.9 % IV SOLN
INTRAVENOUS | Status: DC
  Administered 2015-07-08: 11:00:00 via INTRAVENOUS

## 2015-07-08 MED ORDER — POTASSIUM CHLORIDE 20 MEQ PO PACK
30.0000 meq | PACK | Freq: Every day | ORAL | Status: DC
  Administered 2015-07-08 – 2015-07-09 (×2): 30 meq via ORAL
  Filled 2015-07-08 (×2): qty 2

## 2015-07-08 NOTE — Anesthesia Postprocedure Evaluation (Signed)
Anesthesia Post Note  Patient: Caroline Aguilar  Procedure(s) Performed: Procedure(s) (LRB): ESOPHAGOGASTRODUODENOSCOPY (EGD) (N/A)  Patient location during evaluation: PACU Anesthesia Type: General Level of consciousness: awake and alert Pain management: pain level controlled Vital Signs Assessment: post-procedure vital signs reviewed and stable Respiratory status: spontaneous breathing and respiratory function stable Cardiovascular status: stable Anesthetic complications: no    Last Vitals:  Filed Vitals:   07/08/15 1117 07/08/15 1152  BP:  114/64  Pulse: 62 62  Temp:  36.3 C  Resp:  16    Last Pain:  Filed Vitals:   07/08/15 1154  PainSc: 0-No pain                 Lyncoln Maskell K

## 2015-07-08 NOTE — Progress Notes (Signed)
PT Cancellation Note  Patient Details Name: Caroline Aguilar MRN: [id number] DOB: November 07, 1925   Cancelled Treatment:    Reason Eval/Treat Not Completed: Patient at procedure or test/unavailable. Pt's chart reviewed. Per RN pt is gone for an EGD study at this time and is not available. PT will f/u at a later time to resume therapy.  Neoma Laming, PT, DPT  07/08/2015, 10:54 AM [phone]

## 2015-07-08 NOTE — Op Note (Signed)
[facility] Gastroenterology Patient Name: Caroline Aguilar Procedure Date: 07/08/2015 11:33 AM MRN: [id number] Account #: [id number] Date of Birth: [birth year] Admit Type: Inpatient Age: 82 Room: [facility] ENDO ROOM 4 Gender: Female Note Status: Finalized Procedure:            Upper GI endoscopy Indications:          Dysphagia Providers:            Lupita Dawn. Candace Cruise, MD Referring MD:         [name], MD (Referring MD) Medicines:            Monitored Anesthesia Care Complications:        No immediate complications. Procedure:            Pre-Anesthesia Assessment:                       - Prior to the procedure, a History and Physical was                        performed, and patient medications, allergies and                        sensitivities were reviewed. The patient's tolerance of                        previous anesthesia was reviewed.                       - The risks and benefits of the procedure and the                        sedation options and risks were discussed with the                        patient. All questions were answered and informed                        consent was obtained.                       - After reviewing the risks and benefits, the patient                        was deemed in satisfactory condition to undergo the                        procedure.                       After obtaining informed consent, the endoscope was                        passed under direct vision. Throughout the procedure,                        the patient's blood pressure, pulse, and oxygen                        saturations were monitored continuously. The Endoscope  was introduced through the mouth, and advanced to the                        second part of duodenum. The upper GI endoscopy was                        accomplished without difficulty. The patient tolerated                        the procedure well. Findings:      LA Grade A  (one or more mucosal breaks less than 5 mm, not extending       between tops of 2 mucosal folds) esophagitis was found at the       gastroesophageal junction. Biopsies were taken with a cold forceps for       histology. The scope was withdrawn. Dilation was performed with a       Maloney dilator with mild resistance at 9 Fr.      The exam was otherwise without abnormality.      The entire examined stomach was normal.      The examined duodenum was normal. Impression:           - LA Grade A reflux esophagitis. Biopsied. Dilated.                       - The examination was otherwise normal.                       - Normal stomach.                       - Normal examined duodenum. Recommendation:       - Discharge patient to home.                       - Observe patient's clinical course.                       - Continue present medications.                       - Await pathology results.                       - The findings and recommendations were discussed with                        the patient. Procedure Code(s):    --- Professional ---                       [phone], Esophagogastroduodenoscopy, flexible, transoral;                        with biopsy, single or multiple                       43450, Dilation of esophagus, by unguided sound or                        bougie, single or multiple passes Diagnosis Code(s):    --- Professional ---  K21.0, Gastro-esophageal reflux disease with esophagitis                       R13.10, Dysphagia, unspecified CPT copyright 2016 American Medical Association. All rights reserved. The codes documented in this report are preliminary and upon coder review may  be revised to meet current compliance requirements. Hulen Luster, MD 07/08/2015 11:44:26 AM This report has been signed electronically. Number of Addenda: 0 Note Initiated On: 07/08/2015 11:33 AM      [facility]

## 2015-07-08 NOTE — Consult Note (Signed)
   [facility] CM Inpatient Consult   07/08/2015  Caroline Aguilar June 11, 1925 [id number]   Patient screened for potential [facility] services. Patient is eligible for [place]. Went by to see patient but patient was down for a procedure. If patient's post hospital needs show patient would benefit from [facility] please place a [facility] consult. For questions please contact:   Brandyn Lowrey RN, [facility] Liaison  [phone]) Business Mobile [phone]) Toll free office

## 2015-07-08 NOTE — Progress Notes (Signed)
[facility]  ROUNDING NOTE   Subjective:  Serum sodium currently 129 and slightly improved as compared to yesterday. Patient appears to be awake and alert.    Objective:  Vital signs in last 24 hours:  Temp:  [97.7 F (36.5 C)-98.2 F (36.8 C)] 98.2 F (36.8 C) (03/23 0422) Pulse Rate:  [64-104] 64 (03/23 0815) Resp:  [18-20] 18 (03/23 0815) BP: (106-172)/(55-86) 163/59 mmHg (03/23 0816) SpO2:  [91 %-98 %] 95 % (03/23 0815)  Weight change:  Filed Weights   07/02/15 1550 07/02/15 2021  Weight: 75.751 kg (167 lb) 77.656 kg (171 lb 3.2 oz)    Intake/Output: I/O last 3 completed shifts: In: 480 [P.O.:480] Out: 675 [Urine:675]   Intake/Output this shift:     Physical Exam: General: NAD, resting in bed  Head: Normocephalic, atraumatic. Moist oral mucosal membranes  Eyes: Anicteric  Neck: Supple, trachea midline  Lungs:  Clear to auscultation  Heart: S1S2 no rubs  Abdomen:  Soft, nontender, BS present  Extremities: trace peripheral edema.  Neurologic: Nonfocal, moving all four extremities  Skin: No lesions       Basic Metabolic Panel:  Recent Labs Lab 07/04/15 0510 07/05/15 0513 07/06/15 0500 07/07/15 0925 07/08/15 0638  NA 126* 125* 122* 128* 129*  K 3.8 4.7 4.0 3.5 3.6  CL 90* 90* 89* 89* 91*  CO2 29 27 26 29  33*  GLUCOSE 109* 118* 101* 146* 102*  BUN 20 17 15 11 10   CREATININE 0.55 0.46 0.54 0.62 0.51  CALCIUM 8.2* 8.1* 7.6* 8.3* 7.8*    Liver Function Tests: No results for input(s): AST, ALT, ALKPHOS, BILITOT, PROT, ALBUMIN in the last 168 hours. No results for input(s): LIPASE, AMYLASE in the last 168 hours. No results for input(s): AMMONIA in the last 168 hours.  CBC:  Recent Labs Lab 07/02/15 1613 07/03/15 0516 07/04/15 0510 07/07/15 0925  WBC 13.7* 11.4* 9.9 7.6  NEUTROABS 11.3*  --   --   --   HGB 11.7* 11.8* 11.5* 12.7  HCT 35.2 34.8* 34.0* 37.4  MCV 87.8 89.2 88.8 87.5  PLT 383 382 349 460*    Cardiac  Enzymes: No results for input(s): CKTOTAL, CKMB, CKMBINDEX, TROPONINI in the last 168 hours.  BNP: Invalid input(s): POCBNP  CBG: No results for input(s): GLUCAP in the last 168 hours.  Microbiology: Results for orders placed or performed during the hospital encounter of 07/02/15  Blood culture (routine x 2)     Status: None   Collection Time: 07/02/15  4:13 PM  Result Value Ref Range Status   Specimen Description BLOOD RT [facility]  Final   Special Requests BOTTLES DRAWN AEROBIC AND ANAEROBIC 5CC  Final   Culture NO GROWTH 5 DAYS  Final   Report Status 07/07/2015 FINAL  Final  Blood culture (routine x 2)     Status: None   Collection Time: 07/02/15  4:14 PM  Result Value Ref Range Status   Specimen Description BLOOD LT [facility]  Final   Special Requests BOTTLES DRAWN AEROBIC AND ANAEROBIC AER9CC [facility]  Final   Culture NO GROWTH 5 DAYS  Final   Report Status 07/07/2015 FINAL  Final  Urine culture     Status: None   Collection Time: 07/02/15  6:24 PM  Result Value Ref Range Status   Specimen Description URINE, RANDOM  Final   Special Requests NONE  Final   Culture >=100,000 COLONIES/mL ESCHERICHIA COLI  Final   Report Status 07/05/2015 FINAL  Final  Organism ID, Bacteria ESCHERICHIA COLI  Final      Susceptibility   Escherichia coli - MIC*    AMPICILLIN >=32 RESISTANT Resistant     CEFAZOLIN <=4 SENSITIVE Sensitive     CEFTRIAXONE <=1 SENSITIVE Sensitive     CIPROFLOXACIN >=4 RESISTANT Resistant     GENTAMICIN <=1 SENSITIVE Sensitive     IMIPENEM <=0.25 SENSITIVE Sensitive     NITROFURANTOIN <=16 SENSITIVE Sensitive     TRIMETH/SULFA >=320 RESISTANT Resistant     AMPICILLIN/SULBACTAM 16 INTERMEDIATE Intermediate     PIP/TAZO <=4 SENSITIVE Sensitive     Extended ESBL NEGATIVE Sensitive     * >=100,000 COLONIES/mL ESCHERICHIA COLI  Culture, body fluid-bottle     Status: None (Preliminary result)   Collection Time: 07/05/15 10:05 AM  Result Value Ref Range Status   Specimen  Description PLEURAL  Final   Special Requests NONE  Final   Culture NO GROWTH 2 DAYS  Final   Report Status PENDING  Incomplete    Coagulation Studies: No results for input(s): LABPROT, INR in the last 72 hours.  Urinalysis: No results for input(s): COLORURINE, LABSPEC, PHURINE, GLUCOSEU, HGBUR, BILIRUBINUR, KETONESUR, PROTEINUR, UROBILINOGEN, NITRITE, LEUKOCYTESUR in the last 72 hours.  Invalid input(s): APPERANCEUR    Imaging: No results found.   Medications:     . amiodarone  400 mg Oral BID  . calcium-vitamin D  1 tablet Oral Daily  . cephALEXin  500 mg Oral Q12H  . docusate sodium  100 mg Oral Daily  . enoxaparin (LOVENOX) injection  80 mg Subcutaneous Q12H  . feeding supplement (ENSURE ENLIVE)  237 mL Oral BID BM  . furosemide  20 mg Oral Daily  . latanoprost  1 drop Both Eyes QHS  . metoprolol succinate  12.5 mg Oral QHS  . metoprolol succinate  25 mg Oral q morning - 10a  . pantoprazole  40 mg Oral Daily  . sertraline  50 mg Oral QHS  . simvastatin  20 mg Oral QHS  . sodium chloride  1 g Oral BID WC  . timolol  1 drop Both Eyes BID   acetaminophen **OR** acetaminophen, morphine injection, ondansetron **OR** ondansetron (ZOFRAN) IV, oxyCODONE, simethicone  Assessment/ Plan:  82 y.o. female with a PMHx of hyperlipidemia, colon cancer, right carotid artery stenosis, glaucoma, peripheral neuropathy, history of left chest shingles who was admitted to [facility] on 07/02/2015 for evaluation of weakness and shortness of breath.   1. Acute hyponatremia, suspect SIADH. 2. A. Fib with RVR 3. Left pleural effusion s/p thoracentesis 07/05/15  Plan:  Serum sodium currently up to 129. Continue Lasix 20 mg by mouth daily as well as salt tablets 1 g by mouth twice a day. She will likely need continued monitoring as an outpatient as well. If she discharges today we would like to see her back in the office in one week.    LOS: 6 Annalese Stiner 3/23/20178:47 AM

## 2015-07-08 NOTE — Anesthesia Preprocedure Evaluation (Signed)
Anesthesia Evaluation  Patient identified by MRN, date of birth, ID band Patient awake    Reviewed: Allergy & Precautions, NPO status , Patient's Chart, lab work & pertinent test results  History of Anesthesia Complications Negative for: history of anesthetic complications  Airway Mallampati: II       Dental   Pulmonary resolved,           Cardiovascular + angina +CHF  + dysrhythmias Atrial Fibrillation      Neuro/Psych negative neurological ROS     GI/Hepatic negative GI ROS, Neg liver ROS,   Endo/Other  negative endocrine ROS  Renal/GU Renal InsufficiencyRenal diseasenegative Renal ROS     Musculoskeletal   Abdominal   Peds  Hematology negative hematology ROS (+)   Anesthesia Other Findings   Reproductive/Obstetrics                             Anesthesia Physical Anesthesia Plan  ASA: III  Anesthesia Plan:    Post-op Pain Management:    Induction: Intravenous  Airway Management Planned: Nasal Cannula  Additional Equipment:   Intra-op Plan:   Post-operative Plan:   Informed Consent: I have reviewed the patients History and Physical, chart, labs and discussed the procedure including the risks, benefits and alternatives for the proposed anesthesia with the patient or authorized representative who has indicated his/her understanding and acceptance.     Plan Discussed with:   Anesthesia Plan Comments:         Anesthesia Quick Evaluation

## 2015-07-08 NOTE — Op Note (Signed)
EGD showed minimal esophagitis. Bx taken. No obvious stricture. However, esophageal dilation done with 54 Fr Maloney dilator. Mechanical soft diet ordered. Continue daily protonix for now until bx results are back. If dysphagia persists 1 week after dilation, then pt likely has esophageal dysmotility. In such case, esophageal manometry will need to scheduled later as outpatient. Will sign off. Thanks.

## 2015-07-08 NOTE — Transfer of Care (Signed)
Immediate Anesthesia Transfer of Care Note  Patient: Caroline Aguilar  Procedure(s) Performed: Procedure(s): ESOPHAGOGASTRODUODENOSCOPY (EGD) (N/A)  Patient Location: PACU and Endoscopy Unit  Anesthesia Type:General  Level of Consciousness: sedated and responds to stimulation  Airway & Oxygen Therapy: Patient Spontanous Breathing and Patient connected to nasal cannula oxygen  Post-op Assessment: Report given to RN and Post -op Vital signs reviewed and stable  Post vital signs: Reviewed and stable  Last Vitals:  Filed Vitals:   07/08/15 1117 07/08/15 1152  BP:  114/64  Pulse: 62 62  Temp:  35.8 C  Resp:  16    Complications: No apparent anesthesia complications

## 2015-07-08 NOTE — Progress Notes (Signed)
[facility] [place] NAME: Caroline Aguilar    MR#:  [id number]  DATE OF BIRTH:  1926-02-22  SUBJECTIVE: Patient says that she feels short of breath but improving. Scheduled to have EGD.   CHIEF COMPLAINT:   Chief Complaint  Patient presents with  . Weakness  . Shortness of Breath   -  REVIEW OF SYSTEMS:  Review of Systems  Constitutional: Negative for fever and chills.  HENT: Negative for ear discharge, ear pain and nosebleeds.   Eyes: Negative for blurred vision and double vision.  Respiratory: Negative for cough, shortness of breath and wheezing.   Cardiovascular: Negative for chest pain, palpitations and leg swelling.  Gastrointestinal: Negative for nausea, vomiting, abdominal pain, diarrhea and constipation.  Genitourinary: Negative for dysuria.  Musculoskeletal: Negative for myalgias.  Neurological: Negative for dizziness, tremors, sensory change, speech change, focal weakness, seizures and headaches.  Psychiatric/Behavioral: Negative for depression.    DRUG ALLERGIES:   Allergies  Allergen Reactions  . Penicillins Anaphylaxis and Other (See Comments)    Has patient had a PCN reaction causing immediate rash, facial/tongue/throat swelling, SOB or lightheadedness with hypotension: Yes Has patient had a PCN reaction causing severe rash involving mucus membranes or skin necrosis: No Has patient had a PCN reaction that required hospitalization No Has patient had a PCN reaction occurring within the last 10 years: No If all of the above answers are "NO", then may proceed with Cephalosporin use.  . Azithromycin Other (See Comments)    Reaction:  Unknown   . Famciclovir Other (See Comments)    Reaction:  Unknown     VITALS:  Blood pressure 114/64, pulse 62, temperature 96.4 F (35.8 C), temperature source Tympanic, resp. rate 16, height 5\' 5"  (1.651 m), weight 77.656 kg (171 lb 3.2 oz), SpO2 100 %.  PHYSICAL EXAMINATION:   Physical Exam  GENERAL:  82 y.o.-year-old patient lying in the bed with no acute distress.  EYES: Pupils equal, round, reactive to light and accommodation. No scleral icterus. Extraocular muscles intact.  HEENT: Head atraumatic, normocephalic. Oropharynx and nasopharynx clear.  NECK:  Supple, no jugular venous distention. No thyroid enlargement, no tenderness.  LUNGS:Bilaterally clear to auscultation.No use of accessory muscles of respiration.  CARDIOVASCULAR: S1, S2 normal. No rubs, or gallops.ESM in aortic area. ABDOMEN: Soft, nontender, nondistended. Bowel sounds present. No organomegaly or mass.  EXTREMITIES: No pedal edema, cyanosis, or clubbing.  NEUROLOGIC: Cranial nerves II through XII are intact. Muscle strength 5/5 in all extremities. Sensation intact. Gait not checked.  PSYCHIATRIC: [name].  SKIN: No obvious rash, lesion, or ulcer.    LABORATORY PANEL:   CBC  Recent Labs Lab 07/07/15 0925  WBC 7.6  HGB 12.7  HCT 37.4  PLT 460*   ------------------------------------------------------------------------------------------------------------------  Chemistries   Recent Labs Lab 07/08/15 0638  NA 129*  K 3.6  CL 91*  CO2 33*  GLUCOSE 102*  BUN 10  CREATININE 0.51  CALCIUM 7.8*   ------------------------------------------------------------------------------------------------------------------  Cardiac Enzymes No results for input(s): TROPONINI in the last 168 hours. ------------------------------------------------------------------------------------------------------------------  RADIOLOGY:  No results found.  EKG:   Orders placed or performed during the hospital encounter of 07/02/15  . EKG 12-Lead  . EKG 12-Lead    ASSESSMENT AND PLAN:   3 y/oF with PMH of HTN, Hyperlipidemia who is independent at baseline presents to the hospital secondary to worsening dyspnea Denies any chest pain or palpitations. Noted to be  in A.  fib with RVR.  #1 Atrial fibrillation with rvr- rate better controlled;Off the amiodarone drip. Continue oral amiodarone at 400 mg by mouth twice a day today, also on oral metoprolol Rate stable, -Echocardiogram just done last week showing normal ejection fraction. Minimal mitral stenosis.moderate AS> -On Lovenox  therapeutic dose for A. fib.  Start Eliquis today.  #2 dyspnea on exertion-worsening over the last month. Likely due to pleural effusion on the left side: Status post thoracocentesis, 600 mL of fluid drained ; has transudative pleural effusion ,negative for malignancy. Cultures are negative. -CT angiogram done last week did not show any pulmonary embolism .  For dyspnea,pt prefers cardiac cath but  Because of hyponatremia,cardiology prefers to wai ttill sodium is up. According to   Dr. Pete Pelt catheter can be done as an outpatient and patient is okay to have it as an outpatient. says she can have cardiac catheter as outpatient.. --Incentive spirometer given'  #3 GERD-on Protonix  #4 depression-on Zoloft  #5 DVT prophylaxis-on Lovenox   6 .hyponatremia:  Likely secondary to SIADH: Improved with fluid restriction, salt tablets. Sodium improved to 129 today. Continue salt tablets, Lasix as an outpatient also and follow up with nephrology as an outpatient in one week. Physical therapy recommends Home PT 7.Ecoli UTI;R to Levaquin. Continue Keflex. ;#8 dysphagia possible esophageal stricture: It is post EGD, EGD did not show any esophageal stricture. But anyway streching is done.. Follow biopsy results. Patient right now on mechanical soft diet. Likely discharge tomorrow. In 50% of the time spent in counseling and coordination of the care. Discussed with patient and patient's daughter.  All the records are reviewed and case discussed with Care Management/Social Workerr. Management plans discussed with the patient, family and they are in agreement.  CODE STATUS: Full  Code  TOTAL TIME TAKING CARE OF THIS PATIENT: 36 minutes.   POSSIBLE D/C IN 2-3 DAYS, DEPENDING ON CLINICAL CONDITION.   Epifanio Lesches M.D on 07/08/2015 at 11:54 AM  Between 7am to 6pm - Pager - [phone]  After 6pm go to [email or web address] - password [email or web address] [facility]  Office  [phone]  CC: Primary care physician; [name], MD

## 2015-07-08 NOTE — OR Nursing (Signed)
12:40 pm - Patient transferred to Room 247 via hospital bed.  Report to Christeen Douglas, R.N.  Norlene Campbell, RN

## 2015-07-09 LAB — BASIC METABOLIC PANEL
Anion gap: 6 (ref 5–15)
BUN: 10 mg/dL (ref 6–20)
CALCIUM: 7.9 mg/dL — AB (ref 8.9–10.3)
CHLORIDE: 91 mmol/L — AB (ref 101–111)
CO2: 34 mmol/L — AB (ref 22–32)
Creatinine, Ser: 0.47 mg/dL (ref 0.44–1.00)
GFR calc non Af Amer: >60 mL/min (ref >60)
Glucose, Bld: 99 mg/dL (ref 65–99)
POTASSIUM: 3.5 mmol/L (ref 3.5–5.1)
Sodium: 131 mmol/L — ABNORMAL LOW (ref 135–145)

## 2015-07-09 LAB — SURGICAL PATHOLOGY

## 2015-07-09 MED ORDER — APIXABAN 2.5 MG PO TABS
2.5000 mg | ORAL_TABLET | Freq: Two times a day (BID) | ORAL | Status: DC
  Administered 2015-07-09: 2.5 mg via ORAL
  Filled 2015-07-09: qty 1

## 2015-07-09 MED ORDER — METOPROLOL SUCCINATE ER 25 MG PO TB24: 25.0000 mg | ORAL_TABLET | Freq: Every morning | ORAL | Status: DC

## 2015-07-09 MED ORDER — APIXABAN 5 MG PO TABS: 5.0000 mg | ORAL_TABLET | Freq: Two times a day (BID) | ORAL | Status: DC

## 2015-07-09 MED ORDER — CEPHALEXIN 500 MG PO CAPS: 500.0000 mg | ORAL_CAPSULE | Freq: Two times a day (BID) | ORAL | Status: DC

## 2015-07-09 MED ORDER — SODIUM CHLORIDE 1 G PO TABS: 1.0000 g | ORAL_TABLET | Freq: Two times a day (BID) | ORAL | Status: DC

## 2015-07-09 MED ORDER — AMIODARONE HCL 400 MG PO TABS: 400.0000 mg | ORAL_TABLET | Freq: Two times a day (BID) | ORAL | Status: DC

## 2015-07-09 MED ORDER — ENSURE ENLIVE PO LIQD: 237.0000 mL | Freq: Two times a day (BID) | ORAL | Status: DC

## 2015-07-09 MED ORDER — APIXABAN 2.5 MG PO TABS: 2.5000 mg | ORAL_TABLET | Freq: Two times a day (BID) | ORAL | Status: DC

## 2015-07-09 MED ORDER — POTASSIUM CHLORIDE 20 MEQ PO PACK: 30.0000 meq | PACK | Freq: Every day | ORAL | Status: DC

## 2015-07-09 MED ORDER — FUROSEMIDE 20 MG PO TABS: 20.0000 mg | ORAL_TABLET | Freq: Every day | ORAL | Status: DC

## 2015-07-09 NOTE — Care Management (Signed)
Notified [facility] of discharge today. SN/PT.

## 2015-07-09 NOTE — Progress Notes (Signed)
Patient received discharge instructions, pt verbalized understanding. IV was removed with no signs of infection. Dressing clean, dry intact. No skin tears or wounds present. Prescription was faxed to pharmacy of choice. Patient was escorted out with staff member via wheelchair via private auto. No further needs from care management team.

## 2015-07-09 NOTE — Progress Notes (Signed)
Per Dr. Vianne Bulls okay for patient to discharge home.

## 2015-07-09 NOTE — Progress Notes (Signed)
PT Cancellation Note  Patient Details Name: KALYNA MICHELS MRN: [id number] DOB: 1926-02-28   Cancelled Treatment:    Reason Eval/Treat Not Completed: Other (comment). PT attempted to see pt to practice stairs today, but she is approved for discharge and declines to practice stairs as she is ready to leave.   Neoma Laming, PT, DPT  07/09/2015, 2:10 PM [phone]

## 2015-07-09 NOTE — Care Management Important Message (Signed)
Important Message  Patient Details  Name: Caroline Aguilar MRN: [id number] Date of Birth: 11-Jan-1926   Medicare Important Message Given:  Yes    Juliann Pulse A Carrington Mullenax 07/09/2015, 11:12 AM

## 2015-07-09 NOTE — Discharge Summary (Signed)
Caroline Aguilar, is a 82 y.o. female  DOB 28-Dec-1925  MRN [id number].  Admission date:  07/02/2015  Admitting Physician  Caroline [place], MD  Discharge Date:  07/09/2015   Primary MD  Caroline Aus, MD  Recommendations for primary care physician for things to follow:   Follow-up with primary doctor in one week Follow up with Dr. Clayborn Aguilar from cardiology in one week. Follow-up with Dr.Munsoor Aguilar in 2 weeks.   Admission Diagnosis  Shortness of breath [R06.02] Hyponatremia [E87.1] Pleural effusion [J90]   Discharge Diagnosis  Shortness of breath [R06.02] Hyponatremia [E87.1] Pleural effusion [J90]    Principal Problem:   Atrial fibrillation with rapid ventricular response (HCC) Active Problems:   Community acquired pneumonia   Hyponatremia      Past Medical History  Diagnosis Date  . Angina pectoris ([place])   . Hyperlipidemia   . Cancer [facility])     Colon cancer    Past Surgical History  Procedure Laterality Date  . Hip surgery Bilateral   . Colon surgery    . Cataract extraction Bilateral   . Esophagogastroduodenoscopy N/A 07/08/2015    Procedure: ESOPHAGOGASTRODUODENOSCOPY (EGD);  Surgeon: Caroline Luster, MD;  Location: [facility] ENDOSCOPY;  Service: Endoscopy;  Laterality: N/A;       History of present illness and  Hospital Course:     Kindly see H&P for history of present illness and admission details, please review complete Labs, Consult reports and Test reports for all details in brief  HPI  from the history and physical done on the day of admission 82 year old female with essential hypertension came in because of shortness of breath. Also has dry cough. Found to have atrial fibrillation with RVR and heart rate 1 40 bpm. Patient received IV Cardizem.   Hospital Course  #1 shortness of breath secondary to  atrial fibrillation with RVR: Patient received IV Cardizem in the emergency room, started on Cardizem drip and admitted to telemetry. Seen by cardiology. Patient troponins were negative on admission. Echocardiogram showed EF more than 55% has moderate aortic stenosis. Patient right now on by mouth amiodarone, metoprolol. Heart rate has been controlled. She received Lovenox while in the hospital. Patient also started on Eliquis for anticoagulation discharge., discussed the risk and benefits of anticoagulations with the patient. #2 left pleural effusion: Post left thoracocentesis, 600 mL fluid drained. Patient fluid from thoracocentesis showed right-sided transudate, cultures were negative, cytology is negative for malignancy.  3,HYponatremia;due to SIADH;seen by nephrology,sodium 123 on admission. No prior history of hyponatremia. Sodium dropped to 116 at one point. Initially patient received fluids but after sodium  Dropped further patient received Lasix. Even then patient's sodium did not improve. Because of no improvement in sodium patient is seen by nephrology, they recommended salt tablets, fluid restriction. And the Lasix. Patient's sodium improved and now it is 131, patient feels much better. Patient needs to see Dr. [name] Aguilar in 1-2 weeks for further management of  Hyponatremia. She can continue Lasix 20 mg daily, salt tablets 1 g by mouth twice a day. Patient will see Dr. [name] Aguilar and an outpatient in one week. #4 dysphagia ; patient is seen by gastroenterology Dr. Candace Aguilar for possible esophageal stricture, patient EGD did not show any stricture, patient did have some stretching of esophagus anyway  to help with her symptoms. Patient did have some choking and she says that she at home also she had choking episodes on and off. So the biopsy results are pending from EGD.  If the EGD biopsies negative for stricture, patient needs motility studies as an outpatient. #5 generalized weakness and shortness of breath:  Patient shortness of breath improved after thoracocentesis. Generalized weakness improved after correcting the sodium. Regarding  atrial fibrillation patient seen by Dr. Clayborn Aguilar he recommended cardiac catheterization as an outpatient. Patient can see him as an outpatient. 6 Escherichia coli UTI resistant to Levaquin. Patient received Rocephin, changed to Keflex. Patient didn't take this Keflex to finish the course. Can follow up with primary doctor for regarding repeat urine cultures. Patient tolerated the Rocephin well even though allergy listed for PCN  Discharge Condition: stable   Follow UP      Discharge Instructions  and  Discharge Medications     Discharge Instructions    Face-to-face encounter (required for Medicare/Medicaid patients)    Complete by:  As directed   I Caroline Aguilar certify that this patient is under my care and that I, or a nurse practitioner or physician's assistant working with me, had a face-to-face encounter that meets the physician face-to-face encounter requirements with this patient on 07/09/2015. The encounter with the patient was in whole, or in part for the following medical condition(s) which is the primary reason for home health care  Exertional dyspnea Hyponatremia UTI afib with rvr  The encounter with the patient was in whole, or in part, for the following medical condition, which is the primary reason for home health care:  whole  I certify that, based on my findings, the following services are medically necessary home health services:   Physical therapy Nursing    Reason for Medically Necessary Home Health Services:  Therapy- Personnel officer, Public librarian  My clinical findings support the need for the above services:  Unsafe ambulation due to balance issues  Further, I certify that my clinical findings support that this patient is homebound due to:  Unsafe ambulation due to balance issues     Home Health    Complete  by:  As directed   To provide the following care/treatments:  PT            Medication List    TAKE these medications        amiodarone 400 MG tablet  Commonly known as:  PACERONE  Take 1 tablet (400 mg total) by mouth 2 (two) times daily.     apixaban 2.5 MG Tabs tablet  Commonly known as:  ELIQUIS  Take 1 tablet (2.5 mg total) by mouth 2 (two) times daily.     aspirin EC 81 MG tablet  Take 81 mg by mouth at bedtime.     Calcium 600-200 MG-UNIT tablet  Take 1 tablet by mouth daily.     cephALEXin 500 MG capsule  Commonly known as:  KEFLEX  Take 1 capsule (500 mg total) by mouth every 12 (twelve) hours.     docusate sodium 100 MG capsule  Commonly known as:  COLACE  Take 100 mg by mouth daily.     feeding supplement (ENSURE ENLIVE) Liqd  Take 237 mLs by mouth 2 (two) times daily between meals.     latanoprost 0.005 % ophthalmic solution  Commonly known as:  XALATAN  Place 1 drop into both eyes at bedtime.     metoprolol succinate 25 MG 24 hr tablet  Commonly known as:  TOPROL-XL  Take 1 tablet (25 mg total) by mouth every morning.     pantoprazole 40 MG tablet  Commonly known as:  PROTONIX  Take 40 mg by mouth daily.     potassium chloride 20 MEQ packet  Commonly known as:  KLOR-CON  Take 30 mEq by mouth daily.     sertraline 50 MG tablet  Commonly known as:  ZOLOFT  Take 50 mg by mouth at bedtime. Reported on 07/04/2015     simvastatin 20 MG tablet  Commonly known as:  ZOCOR  Take 20 mg by mouth at bedtime.     sodium chloride 1 g tablet  Take 1 tablet (1 g total) by mouth 2 (two) times daily with a meal.     timolol 0.5 % ophthalmic solution  Commonly known as:  TIMOPTIC  Place 1 drop into both eyes 2 (two) times daily.     triamterene-hydrochlorothiazide 37.5-25 MG capsule  Commonly known as:  DYAZIDE  Take 1 capsule by mouth daily.          Diet and Activity recommendation: See Discharge Instructions above   Consults obtained  -cardiology, GI, nephrology physical therapy   Major procedures and Radiology Reports - PLEASE review detailed and final reports for all details, in brief -     Dg Chest 1 View  07/05/2015  CLINICAL DATA:  Bilateral pleural effusions. Status post left thoracentesis. EXAM: CHEST 1 VIEW COMPARISON:  Chest x-rays dated 07/04/2015 and 07/02/2015 FINDINGS: There is no pneumothorax after left thoracentesis. Decreased left pleural effusion although some of fluid remains at the left base. Small right effusion is unchanged. Pulmonary vascularity is normal. Aortic atherosclerosis. Thoracic scoliosis and osteopenia and degenerative changes of the shoulders are unchanged. IMPRESSION: No pneumothorax after left thoracentesis. Decreased left pleural effusion. Aortic atherosclerosis. Electronically Signed   By: Lorriane Shire M.D.   On: 07/05/2015 10:00   Dg Chest 2 View  07/04/2015  CLINICAL DATA:  Follow-up pleural effusions. EXAM: CHEST  2 VIEW COMPARISON:  07/02/2015. FINDINGS: Pain no significant change in a moderate to large-sized left pleural effusion and small right pleural effusion. Adjacent left basilar and minimal right basilar airspace opacity is also unchanged. The cardiac silhouette remains enlarged. Diffuse osteopenia. Bilateral shoulder degenerative changes. IMPRESSION: No significant change in bilateral pleural effusions and adjacent atelectasis and/or pneumonia. Electronically Signed   By: Claudie Revering M.D.   On: 07/04/2015 14:44   Dg Chest 2 View  07/02/2015  CLINICAL DATA:  Chest pain for 2 weeks EXAM: CHEST  2 VIEW COMPARISON:  None. FINDINGS: Cardiac shadow is enlarged. Aortic calcifications are noted. A small right-sided pleural effusion is seen. Large left-sided pleural effusion and likely underlying infiltrate is present. This is new from the prior exam. No acute bony abnormality is seen. IMPRESSION: Bilateral pleural effusions left greater than right. There is likely underlying left basilar  infiltrate present as well. Electronically Signed   By: Inez Catalina M.D.   On: 07/02/2015 18:21   Dg Chest 2 View  06/18/2015  CLINICAL DATA:  Acute onset of generalized chest pain, radiating to the neck and back. Initial encounter. EXAM: CHEST  2 VIEW COMPARISON:  Chest radiograph performed 07/26/2014 FINDINGS: The lungs are well-aerated and clear. There is no evidence of focal opacification, pleural effusion or pneumothorax. The heart is borderline enlarged. No acute osseous abnormalities are seen. Chronic compression deformities are noted along the lower thoracic and upper lumbar spine, with changes of vertebroplasty at the upper lumbar spine. IMPRESSION: Borderline cardiomegaly.  Lungs remain grossly clear. Electronically Signed   By: Garald Balding M.D.   On: 06/18/2015 23:04   Ct Angio Chest  Pe W/cm &/or Wo Cm  06/19/2015  CLINICAL DATA:  Acute onset of generalized chest pain, radiating to the neck and back. Initial encounter. EXAM: CT ANGIOGRAPHY CHEST WITH CONTRAST TECHNIQUE: Multidetector CT imaging of the chest was performed using the standard protocol during bolus administration of intravenous contrast. Multiplanar CT image reconstructions and MIPs were obtained to evaluate the vascular anatomy. CONTRAST:  157mL OMNIPAQUE IOHEXOL 350 MG/ML SOLN COMPARISON:  Chest radiograph performed 06/18/2015 FINDINGS: There is no evidence of aortic dissection. There is distention of the ascending thoracic aorta to 4.4 cm in maximal diameter, though this is within the normal range given the patient's age. Scattered calcification is noted along the aortic arch and descending thoracic aorta. Scattered calcification is noted at the aortic valve. There is no evidence of pulmonary embolus. Minimal bibasilar atelectasis is noted. Slight interstitial prominence is noted at the lung apices. There is no evidence of significant focal consolidation, pleural effusion or pneumothorax. No masses are identified; no abnormal focal  contrast enhancement is seen. The heart is mildly enlarged. Trace pericardial fluid remains within normal limits. Visualized mediastinal nodes are grossly unremarkable. No pericardial effusion is identified. No axillary lymphadenopathy is seen. The visualized portions of the thyroid gland are unremarkable in appearance. The visualized portions of the liver and spleen are unremarkable. No acute osseous abnormalities are seen. Degenerative change is noted at the left glenohumeral joint. There is slight chronic loss of height at multiple levels along the thoracic spine. Review of the MIP images confirms the above findings. IMPRESSION: 1. No evidence of aortic dissection. Distention of the ascending thoracic aorta to 4.4 cm in maximal diameter, still within the normal range given the patient's age. 2. Mild cardiomegaly. Scattered calcification along the aortic arch and descending thoracic aorta. Scattered calcification at the aortic valve. 3. Minimal bibasilar atelectasis noted. Slight interstitial prominence the lung apices. 4. Degenerative change at the left glenohumeral joint. Electronically Signed   By: Garald Balding M.D.   On: 06/19/2015 01:03   US Thoracentesis Asp Pleural Space W/img Guide  07/05/2015  INDICATION: Symptomatic left sided pleural effusion - please perform ultrasound-guided thoracentesis for therapeutic and diagnostic purposes. EXAM: US THORACENTESIS ASP PLEURAL SPACE W/IMG GUIDE COMPARISON:  Chest radiograph- 07/04/2015; 07/01/2025 MEDICATIONS: None. COMPLICATIONS: None immediate. TECHNIQUE: Informed written consent was obtained from the patient after a discussion of the risks, benefits and alternatives to treatment. A timeout was performed prior to the initiation of the procedure. Initial ultrasound scanning demonstrates a moderate-sized minimally complex left-sided pleural effusion. The lower chest was prepped and draped in the usual sterile fashion. 1% lidocaine was used for local  anesthesia. An ultrasound image was saved for documentation purposes. An 8 Fr Safe-T-Centesis catheter was introduced. The thoracentesis was performed. The catheter was removed and a dressing was applied. The patient tolerated the procedure well without immediate post procedural complication. The patient was escorted to have an upright chest radiograph. FINDINGS: A total of approximately 600 cc of serous fluid was removed. Requested samples were sent to the laboratory. IMPRESSION: Successful ultrasound-guided left sided thoracentesis yielding 600 cc of pleural fluid. Electronically Signed   By: Sandi Mariscal M.D.   On: 07/05/2015 10:14    Micro Results    Recent Results (from the past 240 hour(s))  Blood culture (routine x 2)     Status: None   Collection Time: 07/02/15  4:13 PM  Result Value Ref Range Status   Specimen Description BLOOD RT [facility]  Final   Special Requests BOTTLES  DRAWN AEROBIC AND ANAEROBIC 5CC  Final   Culture NO GROWTH 5 DAYS  Final   Report Status 07/07/2015 FINAL  Final  Blood culture (routine x 2)     Status: None   Collection Time: 07/02/15  4:14 PM  Result Value Ref Range Status   Specimen Description BLOOD LT AC  Final   Special Requests BOTTLES DRAWN AEROBIC AND ANAEROBIC AER9CC [facility]  Final   Culture NO GROWTH 5 DAYS  Final   Report Status 07/07/2015 FINAL  Final  Urine culture     Status: None   Collection Time: 07/02/15  6:24 PM  Result Value Ref Range Status   Specimen Description URINE, RANDOM  Final   Special Requests NONE  Final   Culture >=100,000 COLONIES/mL ESCHERICHIA COLI  Final   Report Status 07/05/2015 FINAL  Final   Organism ID, Bacteria ESCHERICHIA COLI  Final      Susceptibility   Escherichia coli - MIC*    AMPICILLIN >=32 RESISTANT Resistant     CEFAZOLIN <=4 SENSITIVE Sensitive     CEFTRIAXONE <=1 SENSITIVE Sensitive     CIPROFLOXACIN >=4 RESISTANT Resistant     GENTAMICIN <=1 SENSITIVE Sensitive     IMIPENEM <=0.25 SENSITIVE Sensitive      NITROFURANTOIN <=16 SENSITIVE Sensitive     TRIMETH/SULFA >=320 RESISTANT Resistant     AMPICILLIN/SULBACTAM 16 INTERMEDIATE Intermediate     PIP/TAZO <=4 SENSITIVE Sensitive     Extended ESBL NEGATIVE Sensitive     * >=100,000 COLONIES/mL ESCHERICHIA COLI  Culture, body fluid-bottle     Status: None (Preliminary result)   Collection Time: 07/05/15 10:05 AM  Result Value Ref Range Status   Specimen Description PLEURAL  Final   Special Requests NONE  Final   Culture NO GROWTH 3 DAYS  Final   Report Status PENDING  Incomplete       Today   Subjective:   Caroline Aguilar today has no headache,no chest abdominal pain,no new weakness tingling or numbness, feels much better wants to go home today.  Objective:   Blood pressure 169/65, pulse 65, temperature 97.9 F (36.6 C), temperature source Oral, resp. rate 28, height 5\' 5"  (1.651 m), weight 77.656 kg (171 lb 3.2 oz), SpO2 96 %.   Intake/Output Summary (Last 24 hours) at 07/09/15 1238 Last data filed at 07/09/15 0830  Gross per 24 hour  Intake    120 ml  Output    800 ml  Net   -680 ml    Exam Awake Alert, Oriented x 3, No new F.N deficits, Normal affect [place].AT,PERRAL Supple Neck,No JVD, No cervical lymphadenopathy appriciated.  Symmetrical Chest wall movement, Good air movement bilaterally, CTAB RRR,No Gallops,Rubs or new Murmurs, No Parasternal Heave +ve B.Sounds, Abd Soft, Non tender, No organomegaly appriciated, No rebound -guarding or rigidity. No Cyanosis, Clubbing or edema, No new Rash or bruise  Data Review   CBC w Diff: Lab Results  Component Value Date   WBC 7.6 07/07/2015   WBC 8.7 07/30/2014   HGB 12.7 07/07/2015   HGB 8.9* 07/30/2014   HCT 37.4 07/07/2015   HCT 26.6* 07/30/2014   PLT 460* 07/07/2015   PLT 124* 07/30/2014   LYMPHOPCT 8 07/02/2015   LYMPHOPCT 19.9 07/30/2014   MONOPCT 10 07/02/2015   MONOPCT 10.7 07/30/2014   EOSPCT 0 07/02/2015   EOSPCT 3.1 07/30/2014   BASOPCT 0 07/02/2015    BASOPCT 0.4 07/30/2014    CMP: Lab Results  Component Value Date   NA  131* 07/09/2015   NA 134* 07/30/2014   K 3.5 07/09/2015   K 3.7 07/30/2014   CL 91* 07/09/2015   CL 99* 07/30/2014   CO2 34* 07/09/2015   CO2 29 07/30/2014   BUN 10 07/09/2015   BUN 10 07/30/2014   CREATININE 0.47 07/09/2015   CREATININE 0.33* 07/30/2014   PROT 6.4* 07/26/2014   ALBUMIN 3.8 07/26/2014   BILITOT 0.6 07/26/2014   ALKPHOS 60 07/26/2014   AST 18 07/26/2014   ALT 13* 07/26/2014  .   Total Time in preparing paper work, data evaluation and todays exam - 34 minutes  Clarke Peretz M.D on 07/09/2015 at 12:38 PM    Note: This dictation was prepared with Dragon dictation along with smaller phrase technology. Any transcriptional errors that result from this process are unintentional.

## 2015-07-10 LAB — CULTURE, BODY FLUID W GRAM STAIN -BOTTLE: Culture: NO GROWTH

## 2015-07-13 DIAGNOSIS — E871 Hypo-osmolality and hyponatremia: Secondary | ICD-10-CM | POA: Diagnosis not present

## 2015-07-13 DIAGNOSIS — I480 Paroxysmal atrial fibrillation: Secondary | ICD-10-CM | POA: Diagnosis not present

## 2015-07-13 DIAGNOSIS — I5033 Acute on chronic diastolic (congestive) heart failure: Secondary | ICD-10-CM | POA: Diagnosis not present

## 2015-07-14 ENCOUNTER — Emergency Department: Payer: Commercial Managed Care - HMO

## 2015-07-14 ENCOUNTER — Inpatient Hospital Stay
Admission: EM | Admit: 2015-07-14 | Discharge: 2015-07-20 | DRG: 286 | Disposition: A | Discharge status: Home Health | Payer: Commercial Managed Care - HMO | Attending: Internal Medicine | Admitting: Internal Medicine

## 2015-07-14 DIAGNOSIS — J189 Pneumonia, unspecified organism: Secondary | ICD-10-CM

## 2015-07-14 DIAGNOSIS — K219 Gastro-esophageal reflux disease without esophagitis: Secondary | ICD-10-CM | POA: Diagnosis present

## 2015-07-14 DIAGNOSIS — I350 Nonrheumatic aortic (valve) stenosis: Secondary | ICD-10-CM | POA: Diagnosis present

## 2015-07-14 DIAGNOSIS — R609 Edema, unspecified: Secondary | ICD-10-CM | POA: Diagnosis not present

## 2015-07-14 DIAGNOSIS — R079 Chest pain, unspecified: Secondary | ICD-10-CM | POA: Diagnosis not present

## 2015-07-14 DIAGNOSIS — I5033 Acute on chronic diastolic (congestive) heart failure: Secondary | ICD-10-CM | POA: Diagnosis present

## 2015-07-14 DIAGNOSIS — I130 Hypertensive heart and chronic kidney disease with heart failure and stage 1 through stage 4 chronic kidney disease, or unspecified chronic kidney disease: Secondary | ICD-10-CM | POA: Diagnosis present

## 2015-07-14 DIAGNOSIS — I509 Heart failure, unspecified: Secondary | ICD-10-CM | POA: Diagnosis not present

## 2015-07-14 DIAGNOSIS — Z79899 Other long term (current) drug therapy: Secondary | ICD-10-CM

## 2015-07-14 DIAGNOSIS — Z7982 Long term (current) use of aspirin: Secondary | ICD-10-CM

## 2015-07-14 DIAGNOSIS — E785 Hyperlipidemia, unspecified: Secondary | ICD-10-CM | POA: Diagnosis present

## 2015-07-14 DIAGNOSIS — R600 Localized edema: Secondary | ICD-10-CM | POA: Diagnosis not present

## 2015-07-14 DIAGNOSIS — I480 Paroxysmal atrial fibrillation: Secondary | ICD-10-CM | POA: Diagnosis present

## 2015-07-14 DIAGNOSIS — N183 Chronic kidney disease, stage 3 (moderate): Secondary | ICD-10-CM | POA: Diagnosis present

## 2015-07-14 DIAGNOSIS — R0600 Dyspnea, unspecified: Secondary | ICD-10-CM | POA: Diagnosis not present

## 2015-07-14 DIAGNOSIS — Z880 Allergy status to penicillin: Secondary | ICD-10-CM | POA: Diagnosis not present

## 2015-07-14 DIAGNOSIS — R0602 Shortness of breath: Secondary | ICD-10-CM | POA: Diagnosis not present

## 2015-07-14 DIAGNOSIS — H409 Unspecified glaucoma: Secondary | ICD-10-CM | POA: Diagnosis present

## 2015-07-14 DIAGNOSIS — I5032 Chronic diastolic (congestive) heart failure: Secondary | ICD-10-CM | POA: Diagnosis not present

## 2015-07-14 DIAGNOSIS — M25511 Pain in right shoulder: Secondary | ICD-10-CM | POA: Diagnosis not present

## 2015-07-14 DIAGNOSIS — J90 Pleural effusion, not elsewhere classified: Secondary | ICD-10-CM

## 2015-07-14 DIAGNOSIS — I5031 Acute diastolic (congestive) heart failure: Secondary | ICD-10-CM | POA: Diagnosis not present

## 2015-07-14 DIAGNOSIS — Z8249 Family history of ischemic heart disease and other diseases of the circulatory system: Secondary | ICD-10-CM

## 2015-07-14 DIAGNOSIS — Z85038 Personal history of other malignant neoplasm of large intestine: Secondary | ICD-10-CM

## 2015-07-14 DIAGNOSIS — I129 Hypertensive chronic kidney disease with stage 1 through stage 4 chronic kidney disease, or unspecified chronic kidney disease: Secondary | ICD-10-CM | POA: Diagnosis not present

## 2015-07-14 DIAGNOSIS — I4891 Unspecified atrial fibrillation: Secondary | ICD-10-CM | POA: Diagnosis not present

## 2015-07-14 DIAGNOSIS — Z888 Allergy status to other drugs, medicaments and biological substances status: Secondary | ICD-10-CM

## 2015-07-14 DIAGNOSIS — E871 Hypo-osmolality and hyponatremia: Secondary | ICD-10-CM | POA: Diagnosis not present

## 2015-07-14 DIAGNOSIS — I5023 Acute on chronic systolic (congestive) heart failure: Secondary | ICD-10-CM | POA: Diagnosis not present

## 2015-07-14 DIAGNOSIS — I441 Atrioventricular block, second degree: Secondary | ICD-10-CM | POA: Diagnosis present

## 2015-07-14 DIAGNOSIS — Z7901 Long term (current) use of anticoagulants: Secondary | ICD-10-CM | POA: Diagnosis not present

## 2015-07-14 DIAGNOSIS — E222 Syndrome of inappropriate secretion of antidiuretic hormone: Secondary | ICD-10-CM | POA: Diagnosis present

## 2015-07-14 DIAGNOSIS — I6523 Occlusion and stenosis of bilateral carotid arteries: Secondary | ICD-10-CM | POA: Diagnosis not present

## 2015-07-14 DIAGNOSIS — I482 Chronic atrial fibrillation: Secondary | ICD-10-CM | POA: Diagnosis not present

## 2015-07-14 DIAGNOSIS — I481 Persistent atrial fibrillation: Secondary | ICD-10-CM | POA: Diagnosis not present

## 2015-07-14 DIAGNOSIS — I4819 Other persistent atrial fibrillation: Secondary | ICD-10-CM

## 2015-07-14 DIAGNOSIS — I10 Essential (primary) hypertension: Secondary | ICD-10-CM | POA: Diagnosis not present

## 2015-07-14 HISTORY — DX: Unspecified atrial fibrillation: I48.91

## 2015-07-14 LAB — CBC
HEMATOCRIT: 38.1 % (ref 35.0–47.0)
HEMOGLOBIN: 12.8 g/dL (ref 12.0–16.0)
MCH: 29.3 pg (ref 26.0–34.0)
MCHC: 33.5 g/dL (ref 32.0–36.0)
MCV: 87.5 fL (ref 80.0–100.0)
PLATELETS: 332 10*3/uL (ref 150–440)
RBC: 4.35 MIL/uL (ref 3.80–5.20)
RDW: 13.2 % (ref 11.5–14.5)
WBC: 13.1 10*3/uL — AB (ref 3.6–11.0)

## 2015-07-14 NOTE — ED Notes (Addendum)
Pt to xray, in no acute distress at this time

## 2015-07-14 NOTE — Progress Notes (Signed)
[facility]  Patient Status: patient declined services, after multiple attempts to get in touch with the patient and contacts on file, we reached the patient today 3/29 and patient declined services.     Caroline Aguilar 07/14/2015, 2:56 PM

## 2015-07-14 NOTE — ED Notes (Signed)
Pt arrived from home via EMS c/o chest pain. Per EMS c/o of right shoulder pain 9 out of 10, as well as shortness of breath; EMS also reports pt has a hx of afib, and there ekg reflective of afib. Pt currently states she is experiencing sharp chest pain that s still radiating to her right arm. Pt is alert and oriented, tachypneic, skin color wnl, and afebrile.

## 2015-07-15 ENCOUNTER — Encounter: Payer: Self-pay | Admitting: *Deleted

## 2015-07-15 DIAGNOSIS — Z888 Allergy status to other drugs, medicaments and biological substances status: Secondary | ICD-10-CM | POA: Diagnosis not present

## 2015-07-15 DIAGNOSIS — R0600 Dyspnea, unspecified: Secondary | ICD-10-CM | POA: Diagnosis present

## 2015-07-15 DIAGNOSIS — I480 Paroxysmal atrial fibrillation: Secondary | ICD-10-CM | POA: Diagnosis present

## 2015-07-15 DIAGNOSIS — Z7982 Long term (current) use of aspirin: Secondary | ICD-10-CM | POA: Diagnosis not present

## 2015-07-15 DIAGNOSIS — Z7901 Long term (current) use of anticoagulants: Secondary | ICD-10-CM | POA: Diagnosis not present

## 2015-07-15 DIAGNOSIS — K219 Gastro-esophageal reflux disease without esophagitis: Secondary | ICD-10-CM | POA: Diagnosis present

## 2015-07-15 DIAGNOSIS — I5033 Acute on chronic diastolic (congestive) heart failure: Secondary | ICD-10-CM | POA: Diagnosis present

## 2015-07-15 DIAGNOSIS — N183 Chronic kidney disease, stage 3 (moderate): Secondary | ICD-10-CM | POA: Diagnosis present

## 2015-07-15 DIAGNOSIS — Z8249 Family history of ischemic heart disease and other diseases of the circulatory system: Secondary | ICD-10-CM | POA: Diagnosis not present

## 2015-07-15 DIAGNOSIS — H409 Unspecified glaucoma: Secondary | ICD-10-CM | POA: Diagnosis present

## 2015-07-15 DIAGNOSIS — Z880 Allergy status to penicillin: Secondary | ICD-10-CM | POA: Diagnosis not present

## 2015-07-15 DIAGNOSIS — R079 Chest pain, unspecified: Secondary | ICD-10-CM | POA: Diagnosis present

## 2015-07-15 DIAGNOSIS — E785 Hyperlipidemia, unspecified: Secondary | ICD-10-CM | POA: Diagnosis present

## 2015-07-15 DIAGNOSIS — E222 Syndrome of inappropriate secretion of antidiuretic hormone: Secondary | ICD-10-CM | POA: Diagnosis present

## 2015-07-15 DIAGNOSIS — Z79899 Other long term (current) drug therapy: Secondary | ICD-10-CM | POA: Diagnosis not present

## 2015-07-15 DIAGNOSIS — I441 Atrioventricular block, second degree: Secondary | ICD-10-CM | POA: Diagnosis present

## 2015-07-15 DIAGNOSIS — I130 Hypertensive heart and chronic kidney disease with heart failure and stage 1 through stage 4 chronic kidney disease, or unspecified chronic kidney disease: Secondary | ICD-10-CM | POA: Diagnosis present

## 2015-07-15 DIAGNOSIS — J90 Pleural effusion, not elsewhere classified: Secondary | ICD-10-CM | POA: Diagnosis present

## 2015-07-15 DIAGNOSIS — I350 Nonrheumatic aortic (valve) stenosis: Secondary | ICD-10-CM | POA: Diagnosis present

## 2015-07-15 DIAGNOSIS — Z85038 Personal history of other malignant neoplasm of large intestine: Secondary | ICD-10-CM | POA: Diagnosis not present

## 2015-07-15 LAB — TROPONIN I
Troponin I: <0.03 ng/mL (ref <0.031)
Troponin I: <0.03 ng/mL (ref <0.031)
Troponin I: <0.03 ng/mL (ref <0.031)

## 2015-07-15 LAB — BASIC METABOLIC PANEL
ANION GAP: 8 (ref 5–15)
Anion gap: 8 (ref 5–15)
BUN: 25 mg/dL — AB (ref 6–20)
BUN: 26 mg/dL — ABNORMAL HIGH (ref 6–20)
CHLORIDE: 91 mmol/L — AB (ref 101–111)
CHLORIDE: 93 mmol/L — AB (ref 101–111)
CO2: 34 mmol/L — AB (ref 22–32)
CO2: 31 mmol/L (ref 22–32)
CREATININE: 0.98 mg/dL (ref 0.44–1.00)
CREATININE: 1.03 mg/dL — AB (ref 0.44–1.00)
Calcium: 8.2 mg/dL — ABNORMAL LOW (ref 8.9–10.3)
Calcium: 8.3 mg/dL — ABNORMAL LOW (ref 8.9–10.3)
GFR calc Af Amer: 58 mL/min — ABNORMAL LOW (ref >60)
GFR calc non Af Amer: 47 mL/min — ABNORMAL LOW (ref >60)
GFR calc non Af Amer: 50 mL/min — ABNORMAL LOW (ref >60)
GFR, EST AFRICAN AMERICAN: 55 mL/min — AB (ref >60)
GLUCOSE: 120 mg/dL — AB (ref 65–99)
Glucose, Bld: 128 mg/dL — ABNORMAL HIGH (ref 65–99)
POTASSIUM: 3.6 mmol/L (ref 3.5–5.1)
POTASSIUM: 4.2 mmol/L (ref 3.5–5.1)
SODIUM: 133 mmol/L — AB (ref 135–145)
SODIUM: 132 mmol/L — AB (ref 135–145)

## 2015-07-15 LAB — CBC
HCT: 35.6 % (ref 35.0–47.0)
Hemoglobin: 11.8 g/dL — ABNORMAL LOW (ref 12.0–16.0)
MCH: 29.5 pg (ref 26.0–34.0)
MCHC: 33.2 g/dL (ref 32.0–36.0)
MCV: 88.7 fL (ref 80.0–100.0)
Platelets: 325 10*3/uL (ref 150–440)
RBC: 4.01 MIL/uL (ref 3.80–5.20)
RDW: 13.1 % (ref 11.5–14.5)
WBC: 13.1 10*3/uL — ABNORMAL HIGH (ref 3.6–11.0)

## 2015-07-15 MED ORDER — ASPIRIN 81 MG PO CHEW
324.0000 mg | CHEWABLE_TABLET | Freq: Once | ORAL | Status: AC
  Administered 2015-07-15: 324 mg via ORAL
  Filled 2015-07-15: qty 4

## 2015-07-15 MED ORDER — SODIUM CHLORIDE 1 G PO TABS
1.0000 g | ORAL_TABLET | Freq: Two times a day (BID) | ORAL | Status: DC
  Administered 2015-07-15 – 2015-07-20 (×11): 1 g via ORAL
  Filled 2015-07-15 (×14): qty 1

## 2015-07-15 MED ORDER — PANTOPRAZOLE SODIUM 40 MG PO TBEC
40.0000 mg | DELAYED_RELEASE_TABLET | Freq: Every day | ORAL | Status: DC
  Administered 2015-07-15 – 2015-07-20 (×6): 40 mg via ORAL
  Filled 2015-07-15 (×6): qty 1

## 2015-07-15 MED ORDER — POTASSIUM CHLORIDE 20 MEQ PO PACK
30.0000 meq | PACK | Freq: Every day | ORAL | Status: DC
  Administered 2015-07-15 – 2015-07-19 (×5): 30 meq via ORAL
  Filled 2015-07-15 (×6): qty 2

## 2015-07-15 MED ORDER — ASPIRIN EC 81 MG PO TBEC: 81.0000 mg | DELAYED_RELEASE_TABLET | Freq: Every day | ORAL | Status: DC

## 2015-07-15 MED ORDER — ENSURE ENLIVE PO LIQD
237.0000 mL | Freq: Two times a day (BID) | ORAL | Status: DC
  Administered 2015-07-15 – 2015-07-20 (×6): 237 mL via ORAL

## 2015-07-15 MED ORDER — SODIUM CHLORIDE 0.9 % IV SOLN: 250.0000 mL | INTRAVENOUS | Status: DC | PRN

## 2015-07-15 MED ORDER — AMIODARONE HCL 200 MG PO TABS
400.0000 mg | ORAL_TABLET | Freq: Two times a day (BID) | ORAL | Status: DC
  Administered 2015-07-15 – 2015-07-19 (×10): 400 mg via ORAL
  Filled 2015-07-15 (×10): qty 2

## 2015-07-15 MED ORDER — APIXABAN 2.5 MG PO TABS
2.5000 mg | ORAL_TABLET | Freq: Two times a day (BID) | ORAL | Status: DC
  Administered 2015-07-15: 2.5 mg via ORAL
  Filled 2015-07-15: qty 1

## 2015-07-15 MED ORDER — DOCUSATE SODIUM 100 MG PO CAPS
100.0000 mg | ORAL_CAPSULE | Freq: Every day | ORAL | Status: DC
  Administered 2015-07-15 – 2015-07-20 (×6): 100 mg via ORAL
  Filled 2015-07-15 (×6): qty 1

## 2015-07-15 MED ORDER — ONDANSETRON HCL 4 MG/2ML IJ SOLN: 4.0000 mg | Freq: Four times a day (QID) | INTRAMUSCULAR | Status: DC | PRN

## 2015-07-15 MED ORDER — NITROGLYCERIN 0.4 MG SL SUBL: 0.4000 mg | SUBLINGUAL_TABLET | SUBLINGUAL | Status: DC | PRN

## 2015-07-15 MED ORDER — METOPROLOL SUCCINATE ER 25 MG PO TB24
25.0000 mg | ORAL_TABLET | Freq: Every morning | ORAL | Status: DC
  Administered 2015-07-15 – 2015-07-16 (×2): 25 mg via ORAL
  Filled 2015-07-15 (×2): qty 1

## 2015-07-15 MED ORDER — FUROSEMIDE 10 MG/ML IJ SOLN: 20.0000 mg | Freq: Two times a day (BID) | INTRAMUSCULAR | Status: DC

## 2015-07-15 MED ORDER — TIMOLOL MALEATE 0.5 % OP SOLN
1.0000 [drp] | Freq: Two times a day (BID) | OPHTHALMIC | Status: DC
  Administered 2015-07-15 – 2015-07-20 (×11): 1 [drp] via OPHTHALMIC
  Filled 2015-07-15: qty 5

## 2015-07-15 MED ORDER — APIXABAN 5 MG PO TABS: 5.0000 mg | ORAL_TABLET | Freq: Two times a day (BID) | ORAL | Status: DC

## 2015-07-15 MED ORDER — FUROSEMIDE 10 MG/ML IJ SOLN
20.0000 mg | Freq: Once | INTRAMUSCULAR | Status: AC
  Administered 2015-07-15: 20 mg via INTRAVENOUS
  Filled 2015-07-15: qty 4

## 2015-07-15 MED ORDER — SIMVASTATIN 20 MG PO TABS
20.0000 mg | ORAL_TABLET | Freq: Every day | ORAL | Status: DC
  Administered 2015-07-15 – 2015-07-19 (×5): 20 mg via ORAL
  Filled 2015-07-15 (×5): qty 1

## 2015-07-15 MED ORDER — LATANOPROST 0.005 % OP SOLN
1.0000 [drp] | Freq: Every day | OPHTHALMIC | Status: DC
  Administered 2015-07-15 – 2015-07-19 (×5): 1 [drp] via OPHTHALMIC
  Filled 2015-07-15: qty 2.5

## 2015-07-15 MED ORDER — CALCIUM CARBONATE-VITAMIN D 500-200 MG-UNIT PO TABS
1.0000 | ORAL_TABLET | Freq: Every day | ORAL | Status: DC
  Administered 2015-07-15 – 2015-07-20 (×6): 1 via ORAL
  Filled 2015-07-15 (×6): qty 1

## 2015-07-15 MED ORDER — FUROSEMIDE 10 MG/ML IJ SOLN
40.0000 mg | Freq: Two times a day (BID) | INTRAMUSCULAR | Status: DC
  Administered 2015-07-15 – 2015-07-19 (×9): 40 mg via INTRAVENOUS
  Filled 2015-07-15 (×9): qty 4

## 2015-07-15 MED ORDER — ASPIRIN EC 81 MG PO TBEC
81.0000 mg | DELAYED_RELEASE_TABLET | Freq: Every day | ORAL | Status: DC
  Administered 2015-07-15 – 2015-07-18 (×4): 81 mg via ORAL
  Filled 2015-07-15 (×4): qty 1

## 2015-07-15 MED ORDER — ACETAMINOPHEN 325 MG PO TABS: 650.0000 mg | ORAL_TABLET | ORAL | Status: DC | PRN

## 2015-07-15 MED ORDER — SODIUM CHLORIDE 0.9% FLUSH
3.0000 mL | Freq: Two times a day (BID) | INTRAVENOUS | Status: DC
  Administered 2015-07-15 – 2015-07-20 (×10): 3 mL via INTRAVENOUS

## 2015-07-15 MED ORDER — NITROGLYCERIN 2 % TD OINT
1.0000 [in_us] | TOPICAL_OINTMENT | Freq: Once | TRANSDERMAL | Status: AC
  Administered 2015-07-15: 1 [in_us] via TOPICAL
  Filled 2015-07-15: qty 1

## 2015-07-15 MED ORDER — ASPIRIN 81 MG PO CHEW: 324.0000 mg | CHEWABLE_TABLET | ORAL | Status: AC

## 2015-07-15 MED ORDER — ASPIRIN 300 MG RE SUPP: 300.0000 mg | RECTAL | Status: AC

## 2015-07-15 MED ORDER — FUROSEMIDE 10 MG/ML IJ SOLN
20.0000 mg | Freq: Once | INTRAMUSCULAR | Status: AC
  Administered 2015-07-15: 20 mg via INTRAVENOUS

## 2015-07-15 MED ORDER — FUROSEMIDE 10 MG/ML IJ SOLN
40.0000 mg | Freq: Two times a day (BID) | INTRAMUSCULAR | Status: DC
  Filled 2015-07-15: qty 4

## 2015-07-15 MED ORDER — SODIUM CHLORIDE 0.9% FLUSH: 3.0000 mL | INTRAVENOUS | Status: DC | PRN

## 2015-07-15 MED ORDER — SERTRALINE HCL 50 MG PO TABS
50.0000 mg | ORAL_TABLET | Freq: Every day | ORAL | Status: DC
  Administered 2015-07-15 – 2015-07-19 (×5): 50 mg via ORAL
  Filled 2015-07-15 (×5): qty 1

## 2015-07-15 NOTE — ED Provider Notes (Signed)
[facility] Emergency Department Provider Note  ____________________________________________  Time seen: Approximately 12:42 AM  I have reviewed the triage vital signs and the nursing notes.   HISTORY  Chief Complaint Chest Pain and Atrial Fibrillation    HPI Caroline Aguilar is a 82 y.o. female brought to the ED from home with a chief complaint of chest pain. Patient has a history of atrial fibrillation on Eliquis, CHF on Lasix, who was recently hospitalized and had left thoracentesis with 600 mL fluid drained.He also was found to be hyponatremic secondary to SIADH, seen by nephrology with lowest sodium level 119. She saw her PCP earlier this week who reduce her salt tablets to 1 tablet daily and increase her Lasix to twice a day. She also saw cardiology Dr. Clayborn Bigness in the office yesterday and they discussed possibility of cardiac ablation for her atrial fibrillation. Reports onset of right sided chest pain associated with shortness of breath after her cardiology appointment. Complains of dyspnea on exertion. Denies recent travel or trauma. Denies fever, chills, abdominal pain, nausea, vomiting, diarrhea. Nothing makes her symptoms better. Exertion makes her symptoms worse. She is also finishing up her antibiotic for recent community acquired pneumonia.   Past Medical History  Diagnosis Date  . Angina pectoris ([place])   . Hyperlipidemia   . Cancer [facility])     Colon cancer  . A-fib [facility])     Patient Active Problem List   Diagnosis Date Noted  . Community acquired pneumonia 07/02/2015  . Hyponatremia 07/02/2015  . Atrial fibrillation with rapid ventricular response ([place]) 07/02/2015    Past Surgical History  Procedure Laterality Date  . Hip surgery Bilateral   . Colon surgery    . Cataract extraction Bilateral   . Esophagogastroduodenoscopy N/A 07/08/2015    Procedure: ESOPHAGOGASTRODUODENOSCOPY (EGD);  Surgeon: Hulen Luster, MD;  Location: [facility] ENDOSCOPY;   Service: Endoscopy;  Laterality: N/A;    Current Outpatient Rx  Name  Route  Sig  Dispense  Refill  . amiodarone (PACERONE) 400 MG tablet   Oral   Take 1 tablet (400 mg total) by mouth 2 (two) times daily.   60 tablet   0   . apixaban (ELIQUIS) 2.5 MG TABS tablet   Oral   Take 1 tablet (2.5 mg total) by mouth 2 (two) times daily.   60 tablet   0   . aspirin EC 81 MG tablet   Oral   Take 81 mg by mouth at bedtime.         . Calcium 600-200 MG-UNIT tablet   Oral   Take 1 tablet by mouth daily.         . cephALEXin (KEFLEX) 500 MG capsule   Oral   Take 1 capsule (500 mg total) by mouth every 12 (twelve) hours.   10 capsule   0   . docusate sodium (COLACE) 100 MG capsule   Oral   Take 100 mg by mouth daily.         . feeding supplement, ENSURE ENLIVE, (ENSURE ENLIVE) LIQD   Oral   Take 237 mLs by mouth 2 (two) times daily between meals.   237 mL   12   . furosemide (LASIX) 20 MG tablet   Oral   Take 1 tablet (20 mg total) by mouth daily.   30 tablet   0   . latanoprost (XALATAN) 0.005 % ophthalmic solution   Both Eyes   Place 1 drop into both eyes at  bedtime.         . metoprolol succinate (TOPROL-XL) 25 MG 24 hr tablet   Oral   Take 1 tablet (25 mg total) by mouth every morning.   30 tablet   0   . pantoprazole (PROTONIX) 40 MG tablet   Oral   Take 40 mg by mouth daily.         . potassium chloride (KLOR-CON) 20 MEQ packet   Oral   Take 30 mEq by mouth daily.   30 packet   0   . sertraline (ZOLOFT) 50 MG tablet   Oral   Take 50 mg by mouth at bedtime. Reported on 07/04/2015         . simvastatin (ZOCOR) 20 MG tablet   Oral   Take 20 mg by mouth at bedtime.         . sodium chloride 1 g tablet   Oral   Take 1 tablet (1 g total) by mouth 2 (two) times daily with a meal.   30 tablet   0   . timolol (TIMOPTIC) 0.5 % ophthalmic solution   Both Eyes   Place 1 drop into both eyes 2 (two) times daily.            Allergies Penicillins; Azithromycin; and Famciclovir  Family History  Problem Relation Age of Onset  . Cancer Mother   . Heart failure Father     Social History Social History  Substance Use Topics  . Smoking status: Never Smoker   . Smokeless tobacco: None  . Alcohol Use: No    Review of Systems  Constitutional: No fever/chills. Eyes: No visual changes. ENT: No sore throat. Cardiovascular: Positive for chest pain. Respiratory: Positive for shortness of breath. Gastrointestinal: No abdominal pain.  No nausea, no vomiting.  No diarrhea.  No constipation. Genitourinary: Negative for dysuria. Musculoskeletal: Negative for back pain. Skin: Negative for rash. Neurological: Negative for headaches, focal weakness or numbness.  10-point ROS otherwise negative.  ____________________________________________   PHYSICAL EXAM:  VITAL SIGNS: ED Triage Vitals  Enc Vitals Group     BP 07/14/15 2300 119/65 mmHg     Pulse Rate 07/14/15 2300 96     Resp 07/14/15 2307 27     Temp 07/14/15 2307 97.8 F (36.6 C)     Temp Source 07/14/15 2307 Oral     SpO2 07/14/15 2259 93 %     Weight 07/14/15 2307 166 lb (75.297 kg)     Height 07/14/15 2307 5\' 5"  (1.651 m)     Head Cir --      Peak Flow --      Pain Score 07/14/15 2309 9     Pain Loc --      Pain Edu? --      Excl. in [place]? --     Constitutional: Alert and oriented. Well appearing and in mild acute distress. Eyes: Conjunctivae are normal. PERRL. EOMI. Head: Atraumatic. Nose: No congestion/rhinnorhea. Mouth/Throat: Mucous membranes are moist.  Oropharynx non-erythematous. Neck: No stridor.   Cardiovascular: Normal rate, irregular rhythm. Grossly normal heart sounds.  Good peripheral circulation. Respiratory: Increased respiratory effort.  No retractions. Lungs with rales bilaterally, right greater than left. Gastrointestinal: Soft and nontender. No distention. No abdominal bruits. No CVA tenderness. Musculoskeletal: No  lower extremity tenderness. 1+ BLE nonpitting edema.  No joint effusions. Neurologic:  Normal speech and language. No gross focal neurologic deficits are appreciated.  Skin:  Skin is warm, dry and intact. No rash  noted. Psychiatric: Mood and affect are normal. Speech and behavior are normal.  ____________________________________________   LABS (all labs ordered are listed, but only abnormal results are displayed)  Labs Reviewed  BASIC METABOLIC PANEL - Abnormal; Notable for the following:    Sodium 132 (*)    Chloride 93 (*)    Glucose, Bld 128 (*)    BUN 26 (*)    Creatinine, Ser 1.03 (*)    Calcium 8.3 (*)    GFR calc non Af Amer 47 (*)    GFR calc Af Amer 55 (*)    All other components within normal limits  CBC - Abnormal; Notable for the following:    WBC 13.1 (*)    All other components within normal limits  TROPONIN I   ____________________________________________  EKG  ED ECG REPORT I, SUNG,JADE J, the attending physician, personally viewed and interpreted this ECG.   Date: 07/15/2015  EKG Time: 2302  Rate: 80  Rhythm: Atrial fibrillation  Axis: RAD  Intervals:none  ST&T Change: Nonspecific  ____________________________________________  RADIOLOGY  Chest 2 view (view by me, interpreted per Dr. Radene Knee): 1. Small bilateral pleural effusions, left greater than right, with bibasilar airspace opacities likely reflecting atelectasis. 2. Mild right convex thoracic scoliosis noted. ____________________________________________   PROCEDURES  Procedure(s) performed: None  Critical Care performed: No  ____________________________________________   INITIAL IMPRESSION / ASSESSMENT AND PLAN / ED COURSE  Pertinent labs & imaging results that were available during my care of the patient were reviewed by me and considered in my medical decision making (see chart for details).  82 year old female with a history of atrial fibrillation on Eliquis, CHF recently  increased to twice daily Lasix who presents with chest pain and shortness of breath. She is hypoxic on ambulation (97% at rest, 90% with short distance ambulation) with re-collection of bilateral pleural effusions since thoracentesis one week ago. Will initiate aspirin, nitroglycerin, Lasix and discuss with hospitalist for admission. ____________________________________________   FINAL CLINICAL IMPRESSION(S) / ED DIAGNOSES  Final diagnoses:  Chest pain, unspecified chest pain type  Persistent atrial fibrillation (HCC)  Acute on chronic congestive heart failure, unspecified congestive heart failure type ([place])  Pleural effusion      Paulette Blanch, MD 07/15/15 [phone]

## 2015-07-15 NOTE — Care Management (Signed)
Patient  discharged from [facility] 3/24 with referral to [place] and PT.  Spoke with patient's daughter in law and she stated service was declined because "she was doing so well."  Advanced relays that made several attempts to contact patient  and there was no answer or calls were not returned  until yesterday.  Patient is current with her PCP.  Family has been staying with patient at night since 06/19/2015 "when all this started."  She does not have home 02.  Admitted with chest pain and recurrent pleural effusion

## 2015-07-15 NOTE — Progress Notes (Signed)
[facility]  ROUNDING NOTE   Subjective:  Readmitted for shortness of breath. Found to have atrial fibrillation and pneumonia.   Now scheduled for cardiac catheterization.   Creatinine and sodium are at baseline.   Furosemide 40mg  IV q12  Son at bedside  Objective:  Vital signs in last 24 hours:  Temp:  [97.6 F (36.4 C)-98.2 F (36.8 C)] 98.2 F (36.8 C) (03/30 1127) Pulse Rate:  [57-96] 66 (03/30 1127) Resp:  [16-27] 18 (03/30 1127) BP: (94-142)/(47-74) 116/54 mmHg (03/30 1127) SpO2:  [90 %-100 %] 98 % (03/30 1127) Weight:  [74.027 kg (163 lb 3.2 oz)-75.297 kg (166 lb)] 74.027 kg (163 lb 3.2 oz) (03/30 0522)  Weight change:  Filed Weights   07/14/15 2307 07/15/15 0522  Weight: 75.297 kg (166 lb) 74.027 kg (163 lb 3.2 oz)    Intake/Output: I/O last 3 completed shifts: In: -  Out: 200 [Urine:200]   Intake/Output this shift:  Total I/O In: 123 [P.O.:120; I.V.:3] Out: 300 [Urine:300]  Physical Exam: General: NAD, resting in bed  Head: Normocephalic, atraumatic. Moist oral mucosal membranes  Eyes: Anicteric  Neck: Supple, trachea midline  Lungs:  Bilateral crackles  Heart: S1S2 no rubs  Abdomen:  Soft, nontender, BS present  Extremities: trace peripheral and dependent edema.  Neurologic: Nonfocal, moving all four extremities  Skin: No lesions       Basic Metabolic Panel:  Recent Labs Lab 07/09/15 0458 07/14/15 2331 07/15/15 0530  NA 131* 132* 133*  K 3.5 4.2 3.6  CL 91* 93* 91*  CO2 34* 31 34*  GLUCOSE 99 128* 120*  BUN 10 26* 25*  CREATININE 0.47 1.03* 0.98  CALCIUM 7.9* 8.3* 8.2*    Liver Function Tests: No results for input(s): AST, ALT, ALKPHOS, BILITOT, PROT, ALBUMIN in the last 168 hours. No results for input(s): LIPASE, AMYLASE in the last 168 hours. No results for input(s): AMMONIA in the last 168 hours.  CBC:  Recent Labs Lab 07/14/15 2331 07/15/15 0530  WBC 13.1* 13.1*  HGB 12.8 11.8*  HCT 38.1 35.6  MCV 87.5  88.7  PLT 332 325    Cardiac Enzymes:  Recent Labs Lab 07/14/15 2331 07/15/15 0530 07/15/15 1150  TROPONINI <0.03 <0.03 <0.03    BNP: Invalid input(s): POCBNP  CBG: No results for input(s): GLUCAP in the last 168 hours.  Microbiology: Results for orders placed or performed during the hospital encounter of 07/02/15  Blood culture (routine x 2)     Status: None   Collection Time: 07/02/15  4:13 PM  Result Value Ref Range Status   Specimen Description BLOOD RT [facility]  Final   Special Requests BOTTLES DRAWN AEROBIC AND ANAEROBIC 5CC  Final   Culture NO GROWTH 5 DAYS  Final   Report Status 07/07/2015 FINAL  Final  Blood culture (routine x 2)     Status: None   Collection Time: 07/02/15  4:14 PM  Result Value Ref Range Status   Specimen Description BLOOD LT [facility]  Final   Special Requests BOTTLES DRAWN AEROBIC AND ANAEROBIC AER9CC [facility]  Final   Culture NO GROWTH 5 DAYS  Final   Report Status 07/07/2015 FINAL  Final  Urine culture     Status: None   Collection Time: 07/02/15  6:24 PM  Result Value Ref Range Status   Specimen Description URINE, RANDOM  Final   Special Requests NONE  Final   Culture >=100,000 COLONIES/mL ESCHERICHIA COLI  Final   Report Status 07/05/2015 FINAL  Final   Organism ID, Bacteria ESCHERICHIA COLI  Final      Susceptibility   Escherichia coli - MIC*    AMPICILLIN >=32 RESISTANT Resistant     CEFAZOLIN <=4 SENSITIVE Sensitive     CEFTRIAXONE <=1 SENSITIVE Sensitive     CIPROFLOXACIN >=4 RESISTANT Resistant     GENTAMICIN <=1 SENSITIVE Sensitive     IMIPENEM <=0.25 SENSITIVE Sensitive     NITROFURANTOIN <=16 SENSITIVE Sensitive     TRIMETH/SULFA >=320 RESISTANT Resistant     AMPICILLIN/SULBACTAM 16 INTERMEDIATE Intermediate     PIP/TAZO <=4 SENSITIVE Sensitive     Extended ESBL NEGATIVE Sensitive     * >=100,000 COLONIES/mL ESCHERICHIA COLI  Culture, body fluid-bottle     Status: None   Collection Time: 07/05/15 10:05 AM  Result Value Ref Range  Status   Specimen Description PLEURAL  Final   Special Requests NONE  Final   Culture NO GROWTH 5 DAYS  Final   Report Status 07/10/2015 FINAL  Final    Coagulation Studies: No results for input(s): LABPROT, INR in the last 72 hours.  Urinalysis: No results for input(s): COLORURINE, LABSPEC, PHURINE, GLUCOSEU, HGBUR, BILIRUBINUR, KETONESUR, PROTEINUR, UROBILINOGEN, NITRITE, LEUKOCYTESUR in the last 72 hours.  Invalid input(s): APPERANCEUR    Imaging: Dg Chest 2 View  07/15/2015  CLINICAL DATA:  Acute onset of right shoulder pain and shortness of breath. Sharp chest pain. Initial encounter. EXAM: CHEST  2 VIEW COMPARISON:  Chest radiograph performed 07/05/2015 FINDINGS: Small bilateral pleural effusions are noted, left greater than right, with bibasilar airspace opacities likely reflecting atelectasis. No pneumothorax is seen. The heart is borderline normal in size. No acute osseous abnormalities are identified. Mild right convex thoracic scoliosis is noted. IMPRESSION: 1. Small bilateral pleural effusions, left greater than right, with bibasilar airspace opacities likely reflecting atelectasis. 2. Mild right convex thoracic scoliosis noted. Electronically Signed   By: Garald Balding M.D.   On: 07/15/2015 00:12     Medications:     . amiodarone  400 mg Oral BID  . aspirin  324 mg Oral NOW   Or  . aspirin  300 mg Rectal NOW  . aspirin EC  81 mg Oral QHS  . calcium-vitamin D  1 tablet Oral Daily  . docusate sodium  100 mg Oral Daily  . feeding supplement (ENSURE ENLIVE)  237 mL Oral BID BM  . furosemide  40 mg Intravenous Q12H  . latanoprost  1 drop Both Eyes QHS  . metoprolol succinate  25 mg Oral q morning - 10a  . pantoprazole  40 mg Oral Daily  . potassium chloride  30 mEq Oral Daily  . sertraline  50 mg Oral QHS  . simvastatin  20 mg Oral QHS  . sodium chloride flush  3 mL Intravenous Q12H  . sodium chloride  1 g Oral BID WC  . timolol  1 drop Both Eyes BID   sodium  chloride, acetaminophen, nitroGLYCERIN, ondansetron (ZOFRAN) IV, sodium chloride flush  Assessment/ Plan:  82 y.o.white  female with a PMHx of hyperlipidemia, colon cancer, right carotid artery stenosis, glaucoma, peripheral neuropathy, history of left chest shingles who was admitted to [facility] on 07/02/2015 for evaluation of weakness and shortness of breath.   1. Hyponatremia: with moderate aortic valve stenosis. Secondary to congestive heart failure - Agree with furosemide and fluid restriction - Sodium at goal: >130  2. Chronic Kidney Disease stage III: creatinine at baseline. Kidney function does fluctuate with her volume status and  diuretic therapy - renally dose medications, monitor closely with IV contrast exposure  3. Hypertension with atrial fibrillation: blood pressure at goal. Irregular rate - furosemide and metoprolol - hold triameterene and hydrochlorothiazide   LOS: 0 Yoana Staib 3/30/20174:20 PM

## 2015-07-15 NOTE — Progress Notes (Signed)
[place] at [facility]                                                                                                                                                                                            Patient Demographics   Caroline Aguilar, is a 82 y.o. female, DOB - Sep 20, 1925, [id number]  Admit date - 07/14/2015   Admitting Physician Saundra Shelling, MD  Outpatient Primary MD for the patient is [name], MD   LOS - 0  Subjective:She was recently discharged from the hospital for atrial fibrillation with rapid ventricular rate as well as community-acquired pneumonia who returns back to the ED with complaint of shortness of breath and chest pain. Patient was actually seen by cardiology yesterday and there were discussing possible cardiac catheter as outpatient. She continues to complain of  Chest pain.   Review of Systems:   CONSTITUTIONAL: No documented fever. No fatigue, weakness. No weight gain, no weight loss.  EYES: No blurry or double vision.  ENT: No tinnitus. No postnasal drip. No redness of the oropharynx.  RESPIRATORY: No cough, no wheeze, no hemoptysis. No dyspnea.  CARDIOVASCULAR:  postive chest pain. No orthopnea. No palpitations. No syncope.  GASTROINTESTINAL: No nausea, no vomiting or diarrhea. No abdominal pain. No melena or hematochezia.  GENITOURINARY: No dysuria or hematuria.  ENDOCRINE: No polyuria or nocturia. No heat or cold intolerance.  HEMATOLOGY: No anemia. No bruising. No bleeding.  INTEGUMENTARY: No rashes. No lesions.  MUSCULOSKELETAL: No arthritis. No swelling. No gout.  NEUROLOGIC: No numbness, tingling, or ataxia. No seizure-type activity.  PSYCHIATRIC: No anxiety. No insomnia. No ADD.    Vitals:   Filed Vitals:   07/15/15 0504 07/15/15 0522 07/15/15 0815 07/15/15 1127  BP: 130/48 129/74 109/49 116/54  Pulse: 65 78 64 66  Temp: 97.7 F (36.5 C) 98.2 F (36.8 C) 97.6 F (36.4 C) 98.2 F (36.8 C)   TempSrc: Oral Oral Oral   Resp: 20 20 17 18   Height:      Weight:  74.027 kg (163 lb 3.2 oz)    SpO2: 98% 97% 99% 98%    Wt Readings from Last 3 Encounters:  07/15/15 74.027 kg (163 lb 3.2 oz)  07/02/15 77.656 kg (171 lb 3.2 oz)  06/19/15 74.118 kg (163 lb 6.4 oz)     Intake/Output Summary (Last 24 hours) at 07/15/15 1548 Last data filed at 07/15/15 1028  Gross per 24 hour  Intake    123 ml  Output    500 ml  Net   -377 ml    Physical Exam:   GENERAL:  Pleasant-appearing in no apparent distress.  HEAD, EYES, EARS, NOSE AND THROAT: Atraumatic, normocephalic. Extraocular muscles are intact. Pupils equal and reactive to light. Sclerae anicteric. No conjunctival injection. No oro-pharyngeal erythema.  NECK: Supple. There is no jugular venous distention. No bruits, no lymphadenopathy, no thyromegaly.  HEART: Regular rate and rhythm,. No murmurs, no rubs, no clicks.  LUNGS: Clear to auscultation bilaterally. No rales or rhonchi. No wheezes.  ABDOMEN: Soft, flat, nontender, nondistended. Has good bowel sounds. No hepatosplenomegaly appreciated.  EXTREMITIES: No evidence of any cyanosis, clubbing, or peripheral edema.  +2 pedal and radial pulses bilaterally.  NEUROLOGIC: The patient is alert, awake, and oriented x3 with no focal motor or sensory deficits appreciated bilaterally.  SKIN: Moist and warm with no rashes appreciated.  Psych: Not anxious, depressed LN: No inguinal LN enlargement    Antibiotics   Anti-infectives    None      Medications   Scheduled Meds: . amiodarone  400 mg Oral BID  . aspirin  324 mg Oral NOW   Or  . aspirin  300 mg Rectal NOW  . aspirin EC  81 mg Oral QHS  . calcium-vitamin D  1 tablet Oral Daily  . docusate sodium  100 mg Oral Daily  . feeding supplement (ENSURE ENLIVE)  237 mL Oral BID BM  . furosemide  40 mg Intravenous Q12H  . latanoprost  1 drop Both Eyes QHS  . metoprolol succinate  25 mg Oral q morning - 10a  . pantoprazole  40 mg  Oral Daily  . potassium chloride  30 mEq Oral Daily  . sertraline  50 mg Oral QHS  . simvastatin  20 mg Oral QHS  . sodium chloride flush  3 mL Intravenous Q12H  . sodium chloride  1 g Oral BID WC  . timolol  1 drop Both Eyes BID   Continuous Infusions:  PRN Meds:.sodium chloride, acetaminophen, nitroGLYCERIN, ondansetron (ZOFRAN) IV, sodium chloride flush   Data Review:   Micro Results No results found for this or any previous visit (from the past 240 hour(s)).  Radiology Reports Dg Chest 1 View  07/05/2015  CLINICAL DATA:  Bilateral pleural effusions. Status post left thoracentesis. EXAM: CHEST 1 VIEW COMPARISON:  Chest x-rays dated 07/04/2015 and 07/02/2015 FINDINGS: There is no pneumothorax after left thoracentesis. Decreased left pleural effusion although some of fluid remains at the left base. Small right effusion is unchanged. Pulmonary vascularity is normal. Aortic atherosclerosis. Thoracic scoliosis and osteopenia and degenerative changes of the shoulders are unchanged. IMPRESSION: No pneumothorax after left thoracentesis. Decreased left pleural effusion. Aortic atherosclerosis. Electronically Signed   By: Lorriane Shire M.D.   On: 07/05/2015 10:00   Dg Chest 2 View  07/15/2015  CLINICAL DATA:  Acute onset of right shoulder pain and shortness of breath. Sharp chest pain. Initial encounter. EXAM: CHEST  2 VIEW COMPARISON:  Chest radiograph performed 07/05/2015 FINDINGS: Small bilateral pleural effusions are noted, left greater than right, with bibasilar airspace opacities likely reflecting atelectasis. No pneumothorax is seen. The heart is borderline normal in size. No acute osseous abnormalities are identified. Mild right convex thoracic scoliosis is noted. IMPRESSION: 1. Small bilateral pleural effusions, left greater than right, with bibasilar airspace opacities likely reflecting atelectasis. 2. Mild right convex thoracic scoliosis noted. Electronically Signed   By: Garald Balding  M.D.   On: 07/15/2015 00:12   Dg Chest 2 View  07/04/2015  CLINICAL DATA:  Follow-up pleural effusions. EXAM: CHEST  2 VIEW COMPARISON:  07/02/2015.  FINDINGS: Pain no significant change in a moderate to large-sized left pleural effusion and small right pleural effusion. Adjacent left basilar and minimal right basilar airspace opacity is also unchanged. The cardiac silhouette remains enlarged. Diffuse osteopenia. Bilateral shoulder degenerative changes. IMPRESSION: No significant change in bilateral pleural effusions and adjacent atelectasis and/or pneumonia. Electronically Signed   By: Claudie Revering M.D.   On: 07/04/2015 14:44   Dg Chest 2 View  07/02/2015  CLINICAL DATA:  Chest pain for 2 weeks EXAM: CHEST  2 VIEW COMPARISON:  None. FINDINGS: Cardiac shadow is enlarged. Aortic calcifications are noted. A small right-sided pleural effusion is seen. Large left-sided pleural effusion and likely underlying infiltrate is present. This is new from the prior exam. No acute bony abnormality is seen. IMPRESSION: Bilateral pleural effusions left greater than right. There is likely underlying left basilar infiltrate present as well. Electronically Signed   By: Inez Catalina M.D.   On: 07/02/2015 18:21   Dg Chest 2 View  06/18/2015  CLINICAL DATA:  Acute onset of generalized chest pain, radiating to the neck and back. Initial encounter. EXAM: CHEST  2 VIEW COMPARISON:  Chest radiograph performed 07/26/2014 FINDINGS: The lungs are well-aerated and clear. There is no evidence of focal opacification, pleural effusion or pneumothorax. The heart is borderline enlarged. No acute osseous abnormalities are seen. Chronic compression deformities are noted along the lower thoracic and upper lumbar spine, with changes of vertebroplasty at the upper lumbar spine. IMPRESSION: Borderline cardiomegaly.  Lungs remain grossly clear. Electronically Signed   By: Garald Balding M.D.   On: 06/18/2015 23:04   Ct Angio Chest Pe W/cm &/or Wo  Cm  06/19/2015  CLINICAL DATA:  Acute onset of generalized chest pain, radiating to the neck and back. Initial encounter. EXAM: CT ANGIOGRAPHY CHEST WITH CONTRAST TECHNIQUE: Multidetector CT imaging of the chest was performed using the standard protocol during bolus administration of intravenous contrast. Multiplanar CT image reconstructions and MIPs were obtained to evaluate the vascular anatomy. CONTRAST:  187mL OMNIPAQUE IOHEXOL 350 MG/ML SOLN COMPARISON:  Chest radiograph performed 06/18/2015 FINDINGS: There is no evidence of aortic dissection. There is distention of the ascending thoracic aorta to 4.4 cm in maximal diameter, though this is within the normal range given the patient's age. Scattered calcification is noted along the aortic arch and descending thoracic aorta. Scattered calcification is noted at the aortic valve. There is no evidence of pulmonary embolus. Minimal bibasilar atelectasis is noted. Slight interstitial prominence is noted at the lung apices. There is no evidence of significant focal consolidation, pleural effusion or pneumothorax. No masses are identified; no abnormal focal contrast enhancement is seen. The heart is mildly enlarged. Trace pericardial fluid remains within normal limits. Visualized mediastinal nodes are grossly unremarkable. No pericardial effusion is identified. No axillary lymphadenopathy is seen. The visualized portions of the thyroid gland are unremarkable in appearance. The visualized portions of the liver and spleen are unremarkable. No acute osseous abnormalities are seen. Degenerative change is noted at the left glenohumeral joint. There is slight chronic loss of height at multiple levels along the thoracic spine. Review of the MIP images confirms the above findings. IMPRESSION: 1. No evidence of aortic dissection. Distention of the ascending thoracic aorta to 4.4 cm in maximal diameter, still within the normal range given the patient's age. 2. Mild cardiomegaly.  Scattered calcification along the aortic arch and descending thoracic aorta. Scattered calcification at the aortic valve. 3. Minimal bibasilar atelectasis noted. Slight interstitial prominence the lung apices.  4. Degenerative change at the left glenohumeral joint. Electronically Signed   By: Garald Balding M.D.   On: 06/19/2015 01:03   US Thoracentesis Asp Pleural Space W/img Guide  07/05/2015  INDICATION: Symptomatic left sided pleural effusion - please perform ultrasound-guided thoracentesis for therapeutic and diagnostic purposes. EXAM: US THORACENTESIS ASP PLEURAL SPACE W/IMG GUIDE COMPARISON:  Chest radiograph- 07/04/2015; 07/01/2025 MEDICATIONS: None. COMPLICATIONS: None immediate. TECHNIQUE: Informed written consent was obtained from the patient after a discussion of the risks, benefits and alternatives to treatment. A timeout was performed prior to the initiation of the procedure. Initial ultrasound scanning demonstrates a moderate-sized minimally complex left-sided pleural effusion. The lower chest was prepped and draped in the usual sterile fashion. 1% lidocaine was used for local anesthesia. An ultrasound image was saved for documentation purposes. An 8 Fr Safe-T-Centesis catheter was introduced. The thoracentesis was performed. The catheter was removed and a dressing was applied. The patient tolerated the procedure well without immediate post procedural complication. The patient was escorted to have an upright chest radiograph. FINDINGS: A total of approximately 600 cc of serous fluid was removed. Requested samples were sent to the laboratory. IMPRESSION: Successful ultrasound-guided left sided thoracentesis yielding 600 cc of pleural fluid. Electronically Signed   By: Sandi Mariscal M.D.   On: 07/05/2015 10:14     CBC  Recent Labs Lab 07/14/15 2331 07/15/15 0530  WBC 13.1* 13.1*  HGB 12.8 11.8*  HCT 38.1 35.6  PLT 332 325  MCV 87.5 88.7  MCH 29.3 29.5  MCHC 33.5 33.2  RDW 13.2 13.1     Chemistries   Recent Labs Lab 07/09/15 0458 07/14/15 2331 07/15/15 0530  NA 131* 132* 133*  K 3.5 4.2 3.6  CL 91* 93* 91*  CO2 34* 31 34*  GLUCOSE 99 128* 120*  BUN 10 26* 25*  CREATININE 0.47 1.03* 0.98  CALCIUM 7.9* 8.3* 8.2*   ------------------------------------------------------------------------------------------------------------------ estimated creatinine clearance is 40.7 mL/min (by C-G formula based on Cr of 0.98). ------------------------------------------------------------------------------------------------------------------ No results for input(s): HGBA1C in the last 72 hours. ------------------------------------------------------------------------------------------------------------------ No results for input(s): CHOL, HDL, LDLCALC, TRIG, CHOLHDL, LDLDIRECT in the last 72 hours. ------------------------------------------------------------------------------------------------------------------ No results for input(s): TSH, T4TOTAL, T3FREE, THYROIDAB in the last 72 hours.  Invalid input(s): FREET3 ------------------------------------------------------------------------------------------------------------------ No results for input(s): VITAMINB12, FOLATE, FERRITIN, TIBC, IRON, RETICCTPCT in the last 72 hours.  Coagulation profile No results for input(s): INR, PROTIME in the last 168 hours.  No results for input(s): DDIMER in the last 72 hours.  Cardiac Enzymes  Recent Labs Lab 07/14/15 2331 07/15/15 0530 07/15/15 1150  TROPONINI <0.03 <0.03 <0.03   ------------------------------------------------------------------------------------------------------------------ Invalid input(s): POCBNP    Assessment & Plan   1.. Chest pain that was discussed son with outpatient cardiology regarding cardiac catheterization will await their input. 2. Mild pleural effusion on need to have thoracentesis again 3. Chronic atrial fibrillation continue amiodarone and  aspirin continue Eliquis 4. Hyperlipidemia unspecified continue Zocor 5. History of colon cancer 6. Chronic hyponatremia continue to monitor BMPs     Code Status Orders        Start     Ordered   07/15/15 0439  Full code   Continuous     07/15/15 0439    Code Status History    Date Active Date Inactive Code Status Order ID Comments User Context   07/02/2015  7:03 PM 07/09/2015  6:12 PM Full Code [id number]  Lytle Butte, MD ED   06/19/2015  3:15 AM 06/20/2015  4:31 PM  Full Code [id number]  Saundra Shelling, MD Inpatient    Advance Directive Documentation        Most Recent Value   Type of Advance Directive  Living will, Healthcare Power of Attorney   Pre-existing out of facility DNR order (yellow form or pink MOST form)     "MOST" Form in Place?             Consults Cardiology   DVT Prophylaxis Eliquis  Lab Results  Component Value Date   PLT 325 07/15/2015     Time Spent in minutes   29min  Greater than 50% of time spent in care coordination and counseling patient regarding the condition and plan of care.   Dustin Flock M.D on 07/15/2015 at 3:48 PM  Between 7am to 6pm - Pager - [phone]  After 6pm go to [email or web address] - password [email or web address]   Office  [phone]

## 2015-07-15 NOTE — ED Notes (Signed)
Pt blood pressure 98/55, MD made aware.

## 2015-07-15 NOTE — Plan of Care (Signed)
Problem: Safety: Goal: Ability to remain free from injury will improve Outcome: Progressing Fall precautions in place  Problem: Pain Managment: Goal: General experience of comfort will improve Outcome: Progressing Prn medications  Problem: Tissue Perfusion: Goal: Risk factors for ineffective tissue perfusion will decrease Outcome: Progressing PO eliquis

## 2015-07-15 NOTE — Consult Note (Signed)
[facility]  CARDIOLOGY CONSULT NOTE  Patient ID: Caroline Aguilar MRN: [id number] DOB/AGE: [birth year] 82 y.o.  Admit date: 07/14/2015 Referring Physician Posey Pronto Primary Physician Emily Filbert Primary Cardiologist [facility] Reason for Consultation Chest pain  HPI: 82 year old female referred for evaluation chest pain. The patient has known history of paroxysmal atrial fibrillation, marked severe aortic stenosis, and recent thoracentesis for left pleural effusion. The patient returns with chest pain, with atypical features, sharp in nature, with radiation to her right shoulder, exacerbated by deep breaths. EKG is nondiagnostic. Troponin is negative. Recent echocardiogram 06/19/2015 revealed LV ejection fraction of 50-55%, calculated aortic valve area 0.86 cm, with mean gradient of 20 mmHg and peak gradient of 33 mmHg. The patient denies a history of exertional chest pain. He does have exertional dyspnea with intermittent peripheral edema with recent history of left pleural effusion. She denies presyncope or syncope. The patient does have paroxysmal atrial fibrillation, on Eliquis for stroke prevention.  Review of systems complete and found to be negative unless listed above     Past Medical History  Diagnosis Date  . Angina pectoris ([place])   . Hyperlipidemia   . Cancer [facility])     Colon cancer  . A-fib [facility])     Past Surgical History  Procedure Laterality Date  . Hip surgery Bilateral   . Colon surgery    . Cataract extraction Bilateral   . Esophagogastroduodenoscopy N/A 07/08/2015    Procedure: ESOPHAGOGASTRODUODENOSCOPY (EGD);  Surgeon: Hulen Luster, MD;  Location: [facility] ENDOSCOPY;  Service: Endoscopy;  Laterality: N/A;    Prescriptions prior to admission  Medication Sig Dispense Refill Last Dose  . amiodarone (PACERONE) 400 MG tablet Take 1 tablet (400 mg total) by mouth 2 (two) times daily. 60 tablet 0 unknown  . apixaban (ELIQUIS) 2.5 MG TABS tablet Take 1 tablet (2.5 mg total) by mouth  2 (two) times daily. 60 tablet 0 unknown  . aspirin EC 81 MG tablet Take 81 mg by mouth at bedtime.   unknown  . Calcium Carbonate-Vitamin D (CALCIUM 600+D) 600-200 MG-UNIT TABS Take 1 tablet by mouth daily.   unknown  . cephALEXin (KEFLEX) 500 MG capsule Take 1 capsule (500 mg total) by mouth every 12 (twelve) hours. 10 capsule 0 unknown  . docusate sodium (COLACE) 100 MG capsule Take 100 mg by mouth daily.   unknown  . feeding supplement, ENSURE ENLIVE, (ENSURE ENLIVE) LIQD Take 237 mLs by mouth 2 (two) times daily between meals. 237 mL 12   . latanoprost (XALATAN) 0.005 % ophthalmic solution Place 1 drop into both eyes at bedtime.   unknown  . metoprolol succinate (TOPROL-XL) 25 MG 24 hr tablet Take 1 tablet (25 mg total) by mouth every morning. 30 tablet 0 unknown  . pantoprazole (PROTONIX) 40 MG tablet Take 40 mg by mouth daily.   unknown  . potassium chloride (KLOR-CON) 20 MEQ packet Take 30 mEq by mouth daily. 30 packet 0 unknown  . sertraline (ZOLOFT) 50 MG tablet Take 50 mg by mouth at bedtime. Reported on 07/04/2015   unknown  . simvastatin (ZOCOR) 20 MG tablet Take 20 mg by mouth at bedtime.   unknown  . sodium chloride 1 g tablet Take 1 tablet (1 g total) by mouth 2 (two) times daily with a meal. 30 tablet 0 unknown  . timolol (TIMOPTIC) 0.5 % ophthalmic solution Place 1 drop into both eyes 2 (two) times daily.   unknown  . triamterene-hydrochlorothiazide (DYAZIDE) 37.5-25 MG capsule Take 1 capsule by  mouth daily.   unknown   Social History   Social History  . Marital Status: Married    Spouse Name: N/A  . Number of Children: N/A  . Years of Education: N/A   Occupational History  . retired    Social History Main Topics  . Smoking status: Never Smoker   . Smokeless tobacco: Not on file  . Alcohol Use: No  . Drug Use: No  . Sexual Activity: Not on file   Other Topics Concern  . Not on file   Social History Narrative    Family History  Problem Relation Age of Onset  .  Cancer Mother   . Heart failure Father       Review of systems complete and found to be negative unless listed above      PHYSICAL EXAM  General: Well developed, well nourished, in no acute distress HEENT:  Normocephalic and atramatic Neck:  No JVD.  Lungs: Clear bilaterally to auscultation and percussion. Heart: HRRR . Normal S1 and S2 without gallops or murmurs.  Abdomen: Bowel sounds are positive, abdomen soft and non-tender  Msk:  Back normal, normal gait. Normal strength and tone for age. Extremities: No clubbing, cyanosis or edema.   Neuro: Alert and oriented X 3. Psych:  Good affect, responds appropriately  Labs:   Lab Results  Component Value Date   WBC 13.1* 07/15/2015   HGB 11.8* 07/15/2015   HCT 35.6 07/15/2015   MCV 88.7 07/15/2015   PLT 325 07/15/2015    Recent Labs Lab 07/15/15 0530  NA 133*  K 3.6  CL 91*  CO2 34*  BUN 25*  CREATININE 0.98  CALCIUM 8.2*  GLUCOSE 120*   Lab Results  Component Value Date   TROPONINI <0.03 07/15/2015    Lab Results  Component Value Date   CHOL 121 06/19/2015   Lab Results  Component Value Date   HDL 57 06/19/2015   Lab Results  Component Value Date   LDLCALC 57 06/19/2015   Lab Results  Component Value Date   TRIG 34 06/19/2015   Lab Results  Component Value Date   CHOLHDL 2.1 06/19/2015   No results found for: LDLDIRECT    Radiology: Dg Chest 1 View  07/05/2015  CLINICAL DATA:  Bilateral pleural effusions. Status post left thoracentesis. EXAM: CHEST 1 VIEW COMPARISON:  Chest x-rays dated 07/04/2015 and 07/02/2015 FINDINGS: There is no pneumothorax after left thoracentesis. Decreased left pleural effusion although some of fluid remains at the left base. Small right effusion is unchanged. Pulmonary vascularity is normal. Aortic atherosclerosis. Thoracic scoliosis and osteopenia and degenerative changes of the shoulders are unchanged. IMPRESSION: No pneumothorax after left thoracentesis. Decreased left  pleural effusion. Aortic atherosclerosis. Electronically Signed   By: Lorriane Shire M.D.   On: 07/05/2015 10:00   Dg Chest 2 View  07/15/2015  CLINICAL DATA:  Acute onset of right shoulder pain and shortness of breath. Sharp chest pain. Initial encounter. EXAM: CHEST  2 VIEW COMPARISON:  Chest radiograph performed 07/05/2015 FINDINGS: Small bilateral pleural effusions are noted, left greater than right, with bibasilar airspace opacities likely reflecting atelectasis. No pneumothorax is seen. The heart is borderline normal in size. No acute osseous abnormalities are identified. Mild right convex thoracic scoliosis is noted. IMPRESSION: 1. Small bilateral pleural effusions, left greater than right, with bibasilar airspace opacities likely reflecting atelectasis. 2. Mild right convex thoracic scoliosis noted. Electronically Signed   By: Garald Balding M.D.   On: 07/15/2015 00:12  Dg Chest 2 View  07/04/2015  CLINICAL DATA:  Follow-up pleural effusions. EXAM: CHEST  2 VIEW COMPARISON:  07/02/2015. FINDINGS: Pain no significant change in a moderate to large-sized left pleural effusion and small right pleural effusion. Adjacent left basilar and minimal right basilar airspace opacity is also unchanged. The cardiac silhouette remains enlarged. Diffuse osteopenia. Bilateral shoulder degenerative changes. IMPRESSION: No significant change in bilateral pleural effusions and adjacent atelectasis and/or pneumonia. Electronically Signed   By: Claudie Revering M.D.   On: 07/04/2015 14:44   Dg Chest 2 View  07/02/2015  CLINICAL DATA:  Chest pain for 2 weeks EXAM: CHEST  2 VIEW COMPARISON:  None. FINDINGS: Cardiac shadow is enlarged. Aortic calcifications are noted. A small right-sided pleural effusion is seen. Large left-sided pleural effusion and likely underlying infiltrate is present. This is new from the prior exam. No acute bony abnormality is seen. IMPRESSION: Bilateral pleural effusions left greater than right. There  is likely underlying left basilar infiltrate present as well. Electronically Signed   By: Inez Catalina M.D.   On: 07/02/2015 18:21   Dg Chest 2 View  06/18/2015  CLINICAL DATA:  Acute onset of generalized chest pain, radiating to the neck and back. Initial encounter. EXAM: CHEST  2 VIEW COMPARISON:  Chest radiograph performed 07/26/2014 FINDINGS: The lungs are well-aerated and clear. There is no evidence of focal opacification, pleural effusion or pneumothorax. The heart is borderline enlarged. No acute osseous abnormalities are seen. Chronic compression deformities are noted along the lower thoracic and upper lumbar spine, with changes of vertebroplasty at the upper lumbar spine. IMPRESSION: Borderline cardiomegaly.  Lungs remain grossly clear. Electronically Signed   By: Garald Balding M.D.   On: 06/18/2015 23:04   Ct Angio Chest Pe W/cm &/or Wo Cm  06/19/2015  CLINICAL DATA:  Acute onset of generalized chest pain, radiating to the neck and back. Initial encounter. EXAM: CT ANGIOGRAPHY CHEST WITH CONTRAST TECHNIQUE: Multidetector CT imaging of the chest was performed using the standard protocol during bolus administration of intravenous contrast. Multiplanar CT image reconstructions and MIPs were obtained to evaluate the vascular anatomy. CONTRAST:  192mL OMNIPAQUE IOHEXOL 350 MG/ML SOLN COMPARISON:  Chest radiograph performed 06/18/2015 FINDINGS: There is no evidence of aortic dissection. There is distention of the ascending thoracic aorta to 4.4 cm in maximal diameter, though this is within the normal range given the patient's age. Scattered calcification is noted along the aortic arch and descending thoracic aorta. Scattered calcification is noted at the aortic valve. There is no evidence of pulmonary embolus. Minimal bibasilar atelectasis is noted. Slight interstitial prominence is noted at the lung apices. There is no evidence of significant focal consolidation, pleural effusion or pneumothorax. No masses  are identified; no abnormal focal contrast enhancement is seen. The heart is mildly enlarged. Trace pericardial fluid remains within normal limits. Visualized mediastinal nodes are grossly unremarkable. No pericardial effusion is identified. No axillary lymphadenopathy is seen. The visualized portions of the thyroid gland are unremarkable in appearance. The visualized portions of the liver and spleen are unremarkable. No acute osseous abnormalities are seen. Degenerative change is noted at the left glenohumeral joint. There is slight chronic loss of height at multiple levels along the thoracic spine. Review of the MIP images confirms the above findings. IMPRESSION: 1. No evidence of aortic dissection. Distention of the ascending thoracic aorta to 4.4 cm in maximal diameter, still within the normal range given the patient's age. 2. Mild cardiomegaly. Scattered calcification along the aortic arch  and descending thoracic aorta. Scattered calcification at the aortic valve. 3. Minimal bibasilar atelectasis noted. Slight interstitial prominence the lung apices. 4. Degenerative change at the left glenohumeral joint. Electronically Signed   By: Garald Balding M.D.   On: 06/19/2015 01:03   US Thoracentesis Asp Pleural Space W/img Guide  07/05/2015  INDICATION: Symptomatic left sided pleural effusion - please perform ultrasound-guided thoracentesis for therapeutic and diagnostic purposes. EXAM: US THORACENTESIS ASP PLEURAL SPACE W/IMG GUIDE COMPARISON:  Chest radiograph- 07/04/2015; 07/01/2025 MEDICATIONS: None. COMPLICATIONS: None immediate. TECHNIQUE: Informed written consent was obtained from the patient after a discussion of the risks, benefits and alternatives to treatment. A timeout was performed prior to the initiation of the procedure. Initial ultrasound scanning demonstrates a moderate-sized minimally complex left-sided pleural effusion. The lower chest was prepped and draped in the usual sterile fashion. 1%  lidocaine was used for local anesthesia. An ultrasound image was saved for documentation purposes. An 8 Fr Safe-T-Centesis catheter was introduced. The thoracentesis was performed. The catheter was removed and a dressing was applied. The patient tolerated the procedure well without immediate post procedural complication. The patient was escorted to have an upright chest radiograph. FINDINGS: A total of approximately 600 cc of serous fluid was removed. Requested samples were sent to the laboratory. IMPRESSION: Successful ultrasound-guided left sided thoracentesis yielding 600 cc of pleural fluid. Electronically Signed   By: Sandi Mariscal M.D.   On: 07/05/2015 10:14    EKG: Normal sinus rhythm  ASSESSMENT AND PLAN:   1. Chest pain, pleuritic in nature, negative troponin, nondiagnostic ECG, likely noncardiac in nature 2. Recent history of left pleural effusion, status post thoracentesis, with evidence of recurrence, associated with peripheral edema, shortness of breath, consistent with diastolic congestive heart failure, possibly related to aortic stenosis 3. Moderate to severe aortic stenosis, of uncertain clinical significance  Recommendations  1. Agree with overall current therapy 2. Defer full dose anticoagulation 3. Consider cardiac catheterization to better define confusing clinical picture, to rule out underlying coronary artery disease, and to better delineate degree of aortic stenosis 4. Hold Eliquis for 2-3 days prior to cardiac catheterization  Signed: Izaiha Lo MD,PhD, [facility] 07/15/2015, 4:42 PM

## 2015-07-15 NOTE — H&P (Signed)
[facility] [place] NAME: Caroline Aguilar    MR#:  [id number]  DATE OF BIRTH:  03/05/1926  DATE OF ADMISSION:  07/14/2015  PRIMARY CARE PHYSICIAN: [name], MD   REQUESTING/REFERRING PHYSICIAN:   CHIEF COMPLAINT:   Chief Complaint  Patient presents with  . Chest Pain  . Atrial Fibrillation    HISTORY OF PRESENT ILLNESS: Caroline Aguilar  is a 82 y.o. female with a known history of Hyperlipidemia, atrial fibrillation, colon cancer, hyponatremia, pleural effusion, aortic stenosis presented to the emergency room with chest pain and increased shortness of breath as stated afternoon. Chest pain is located retrosternally and sharp in nature. Pain is 8 out of 10 on a scale of 1-10 and radiates to right shoulder. Patient is not on home oxygen. She was recently had thoracentesis week ago and 600 mL of pleural fluid was drained. She again presented to the emergency room today with difficulty breathing and was put on oxygen via nasal cannula. First set of troponin was negative. Chest x-ray revealed pleural effusions. Echocardiogram during the last admission workup showed aortic stenosis. Patient saw her cardiologist in the clinic yesterday in the morning and her recommendations were discussed regarding catheter ablation for atrial fibrillation in the next couple of weeks. No history of fever or chills. No history of cough.  PAST MEDICAL HISTORY:   Past Medical History  Diagnosis Date  . Angina pectoris ([place])   . Hyperlipidemia   . Cancer [facility])     Colon cancer  . A-fib [facility])     PAST SURGICAL HISTORY: Past Surgical History  Procedure Laterality Date  . Hip surgery Bilateral   . Colon surgery    . Cataract extraction Bilateral   . Esophagogastroduodenoscopy N/A 07/08/2015    Procedure: ESOPHAGOGASTRODUODENOSCOPY (EGD);  Surgeon: Hulen Luster, MD;  Location: [facility] ENDOSCOPY;  Service: Endoscopy;  Laterality: N/A;    SOCIAL HISTORY:  Social  History  Substance Use Topics  . Smoking status: Never Smoker   . Smokeless tobacco: Not on file  . Alcohol Use: No    FAMILY HISTORY:  Family History  Problem Relation Age of Onset  . Cancer Mother   . Heart failure Father     DRUG ALLERGIES:  Allergies  Allergen Reactions  . Penicillins Anaphylaxis and Other (See Comments)    Has patient had a PCN reaction causing immediate rash, facial/tongue/throat swelling, SOB or lightheadedness with hypotension: Yes Has patient had a PCN reaction causing severe rash involving mucus membranes or skin necrosis: No Has patient had a PCN reaction that required hospitalization No Has patient had a PCN reaction occurring within the last 10 years: No If all of the above answers are "NO", then may proceed with Cephalosporin use.  . Azithromycin Other (See Comments)    Reaction:  Unknown   . Famciclovir Other (See Comments)    Reaction:  Unknown     REVIEW OF SYSTEMS:   CONSTITUTIONAL: No fever, has weakness.  EYES: No blurred or double vision.  EARS, NOSE, AND THROAT: No tinnitus or ear pain.  RESPIRATORY: No cough, has shortness of breath,no wheezing or hemoptysis.  CARDIOVASCULAR: Has chest pain, no orthopnea, edema.  GASTROINTESTINAL: No nausea, vomiting, diarrhea or abdominal pain.  GENITOURINARY: No dysuria, hematuria.  ENDOCRINE: No polyuria, nocturia,  HEMATOLOGY: No anemia, easy bruising or bleeding SKIN: No rash or lesion. MUSCULOSKELETAL: No joint pain or arthritis.   NEUROLOGIC: No tingling, numbness, weakness.  PSYCHIATRY: No  anxiety or depression.   MEDICATIONS AT HOME:  Prior to Admission medications   Medication Sig Start Date End Date Taking? Authorizing Provider  amiodarone (PACERONE) 400 MG tablet Take 1 tablet (400 mg total) by mouth 2 (two) times daily. 07/09/15   Epifanio Lesches, MD  apixaban (ELIQUIS) 2.5 MG TABS tablet Take 1 tablet (2.5 mg total) by mouth 2 (two) times daily. 07/09/15   Epifanio Lesches, MD   aspirin EC 81 MG tablet Take 81 mg by mouth at bedtime.    Historical Provider, MD  Calcium 600-200 MG-UNIT tablet Take 1 tablet by mouth daily.    Historical Provider, MD  cephALEXin (KEFLEX) 500 MG capsule Take 1 capsule (500 mg total) by mouth every 12 (twelve) hours. 07/09/15   Epifanio Lesches, MD  docusate sodium (COLACE) 100 MG capsule Take 100 mg by mouth daily.    Historical Provider, MD  feeding supplement, ENSURE ENLIVE, (ENSURE ENLIVE) LIQD Take 237 mLs by mouth 2 (two) times daily between meals. 07/09/15   Epifanio Lesches, MD  furosemide (LASIX) 20 MG tablet Take 1 tablet (20 mg total) by mouth daily. 07/09/15   Epifanio Lesches, MD  latanoprost (XALATAN) 0.005 % ophthalmic solution Place 1 drop into both eyes at bedtime.    Historical Provider, MD  metoprolol succinate (TOPROL-XL) 25 MG 24 hr tablet Take 1 tablet (25 mg total) by mouth every morning. 07/09/15   Epifanio Lesches, MD  pantoprazole (PROTONIX) 40 MG tablet Take 40 mg by mouth daily.    Historical Provider, MD  potassium chloride (KLOR-CON) 20 MEQ packet Take 30 mEq by mouth daily. 07/09/15   Epifanio Lesches, MD  sertraline (ZOLOFT) 50 MG tablet Take 50 mg by mouth at bedtime. Reported on 07/04/2015    Historical Provider, MD  simvastatin (ZOCOR) 20 MG tablet Take 20 mg by mouth at bedtime.    Historical Provider, MD  sodium chloride 1 g tablet Take 1 tablet (1 g total) by mouth 2 (two) times daily with a meal. 07/09/15   Epifanio Lesches, MD  timolol (TIMOPTIC) 0.5 % ophthalmic solution Place 1 drop into both eyes 2 (two) times daily.    Historical Provider, MD      PHYSICAL EXAMINATION:   VITAL SIGNS: Blood pressure 129/52, pulse 64, temperature 97.8 F (36.6 C), temperature source Oral, resp. rate 18, height 5\' 5"  (1.651 m), weight 75.297 kg (166 lb), SpO2 100 %.  GENERAL:  82 y.o.-year-old patient lying in the bed with no acute distress.  EYES: Pupils equal, round, reactive to light and  accommodation. No scleral icterus. Extraocular muscles intact.  HEENT: Head atraumatic, normocephalic. Oropharynx and nasopharynx clear.  NECK:  Supple, no jugular venous distention. No thyroid enlargement, no tenderness.  LUNGS: Decreased breath sounds bilaterally, basal crepitations heard. No use of accessory muscles of respiration.  CARDIOVASCULAR: S1, S2 irregular. No murmurs, rubs, or gallops.  ABDOMEN: Soft, nontender, nondistended. Bowel sounds present. No organomegaly or mass.  EXTREMITIES: trace pedal edema, cyanosis, or clubbing.  NEUROLOGIC: Cranial nerves II through XII are intact. Muscle strength 5/5 in all extremities. Sensation intact. Gait normal PSYCHIATRIC: [name].  SKIN: No obvious rash, lesion, or ulcer.   LABORATORY PANEL:   CBC  Recent Labs Lab 07/14/15 2331  WBC 13.1*  HGB 12.8  HCT 38.1  PLT 332  MCV 87.5  MCH 29.3  MCHC 33.5  RDW 13.2   ------------------------------------------------------------------------------------------------------------------  Chemistries   Recent Labs Lab 07/08/15 0638 07/09/15 0458 07/14/15  2331  NA 129* 131* 132*  K 3.6 3.5 4.2  CL 91* 91* 93*  CO2 33* 34* 31  GLUCOSE 102* 99 128*  BUN 10 10 26*  CREATININE 0.51 0.47 1.03*  CALCIUM 7.8* 7.9* 8.3*   ------------------------------------------------------------------------------------------------------------------ estimated creatinine clearance is 39.1 mL/min (by C-G formula based on Cr of 1.03). ------------------------------------------------------------------------------------------------------------------ No results for input(s): TSH, T4TOTAL, T3FREE, THYROIDAB in the last 72 hours.  Invalid input(s): FREET3   Coagulation profile No results for input(s): INR, PROTIME in the last 168 hours. ------------------------------------------------------------------------------------------------------------------- No results for input(s):  DDIMER in the last 72 hours. -------------------------------------------------------------------------------------------------------------------  Cardiac Enzymes  Recent Labs Lab 07/14/15 2331  TROPONINI <0.03   ------------------------------------------------------------------------------------------------------------------ Invalid input(s): POCBNP  ---------------------------------------------------------------------------------------------------------------  Urinalysis    Component Value Date/Time   COLORURINE AMBER* 07/02/2015 1824   APPEARANCEUR HAZY* 07/02/2015 1824   LABSPEC 1.020 07/02/2015 1824   PHURINE 5.0 07/02/2015 1824   GLUCOSEU NEGATIVE 07/02/2015 1824   HGBUR 3+* 07/02/2015 1824   BILIRUBINUR NEGATIVE 07/02/2015 [place] 07/02/2015 1824   PROTEINUR 30* 07/02/2015 1824   NITRITE POSITIVE* 07/02/2015 1824   LEUKOCYTESUR 1+* 07/02/2015 1824     RADIOLOGY: Dg Chest 2 View  07/15/2015  CLINICAL DATA:  Acute onset of right shoulder pain and shortness of breath. Sharp chest pain. Initial encounter. EXAM: CHEST  2 VIEW COMPARISON:  Chest radiograph performed 07/05/2015 FINDINGS: Small bilateral pleural effusions are noted, left greater than right, with bibasilar airspace opacities likely reflecting atelectasis. No pneumothorax is seen. The heart is borderline normal in size. No acute osseous abnormalities are identified. Mild right convex thoracic scoliosis is noted. IMPRESSION: 1. Small bilateral pleural effusions, left greater than right, with bibasilar airspace opacities likely reflecting atelectasis. 2. Mild right convex thoracic scoliosis noted. Electronically Signed   By: Garald Balding M.D.   On: 07/15/2015 00:12    EKG: Orders placed or performed during the hospital encounter of 07/14/15  . EKG 12-Lead  . EKG 12-Lead  . ED EKG within 10 minutes  . ED EKG within 10 minutes  . EKG 12-Lead  . EKG 12-Lead    IMPRESSION AND  PLAN: 82 year old elderly female patient with history of atrial fibrillation,, pleural effusion,, hyperlipidemia,, colon cancer,, hyponatremia presented to the emergency room with chest pain and shortness of breath. Admitting diagnosis 1. Chest pain rule out MI 2. Recurrent pleural effusion 3. Chronic atrial fibrillation 4. Hyperlipidemia 5. History of colon cancer 6. Chronic hyponatremia Treatment plan Admit patient to telemetry Resume anticoagulation with oral Eliquis Cardiology consultation Cycle troponin to rule out ischemia Diurese patient with IV Lasix 40 mg every 12 hourly Resume home medications Supportive care.  All the records are reviewed and case discussed with ED provider. Management plans discussed with the patient, family and they are in agreement.  CODE STATUS:FULL Code Status History    Date Active Date Inactive Code Status Order ID Comments User Context   07/02/2015  7:03 PM 07/09/2015  6:12 PM Full Code [id number]  Lytle Butte, MD ED   06/19/2015  3:15 AM 06/20/2015  4:31 PM Full Code [id number]  Saundra Shelling, MD Inpatient    Advance Directive Documentation        Most Recent Value   Type of Advance Directive  Living will, Healthcare Power of Attorney   Pre-existing out of facility DNR order (yellow form or pink MOST form)     "MOST" Form in Place?         TOTAL TIME TAKING  CARE OF THIS PATIENT: 51 minutes.    Saundra Shelling M.D on 07/15/2015 at 1:52 AM  Between 7am to 6pm - Pager - [phone]  After 6pm go to [email or web address] - password [email or web address] [facility]  Office  [phone]  CC: Primary care physician; [name], MD

## 2015-07-16 ENCOUNTER — Other Ambulatory Visit: Payer: Self-pay | Admitting: *Deleted

## 2015-07-16 ENCOUNTER — Encounter: Payer: Self-pay | Admitting: *Deleted

## 2015-07-16 NOTE — Progress Notes (Signed)
[place] at [facility]                                                                                                                                                                                            Patient Demographics   Caroline Aguilar, is a 82 y.o. female, DOB - Jan 07, 1926, [id number]  Admit date - 07/14/2015   Admitting Physician Saundra Shelling, MD  Outpatient Primary MD for the patient is [name], MD   LOS - 1  Subjective:Patient states that she is feeling better. Denies any chest pain  Review of Systems:   CONSTITUTIONAL: No documented fever. No fatigue, weakness. No weight gain, no weight loss.  EYES: No blurry or double vision.  ENT: No tinnitus. No postnasal drip. No redness of the oropharynx.  RESPIRATORY: No cough, no wheeze, no hemoptysis. No dyspnea.  CARDIOVASCULAR: Resolved chest pain. No orthopnea. No palpitations. No syncope.  GASTROINTESTINAL: No nausea, no vomiting or diarrhea. No abdominal pain. No melena or hematochezia.  GENITOURINARY: No dysuria or hematuria.  ENDOCRINE: No polyuria or nocturia. No heat or cold intolerance.  HEMATOLOGY: No anemia. No bruising. No bleeding.  INTEGUMENTARY: No rashes. No lesions.  MUSCULOSKELETAL: No arthritis. No swelling. No gout.  NEUROLOGIC: No numbness, tingling, or ataxia. No seizure-type activity.  PSYCHIATRIC: No anxiety. No insomnia. No ADD.    Vitals:   Filed Vitals:   07/15/15 1127 07/15/15 2051 07/16/15 0534 07/16/15 0728  BP: 116/54 106/59 118/51 114/54  Pulse: 66 70 63 65  Temp: 98.2 F (36.8 C) 98.2 F (36.8 C) 98.2 F (36.8 C) 97.7 F (36.5 C)  TempSrc:  Oral Oral Oral  Resp: 18 20 20 17   Height:      Weight:      SpO2: 98% 97% 96% 99%    Wt Readings from Last 3 Encounters:  07/15/15 74.027 kg (163 lb 3.2 oz)  07/02/15 77.656 kg (171 lb 3.2 oz)  06/19/15 74.118 kg (163 lb 6.4 oz)     Intake/Output Summary (Last 24 hours) at 07/16/15  0902 Last data filed at 07/16/15 [id number]  Gross per 24 hour  Intake      6 ml  Output   1675 ml  Net  -1669 ml    Physical Exam:   GENERAL: Pleasant-appearing in no apparent distress.  HEAD, EYES, EARS, NOSE AND THROAT: Atraumatic, normocephalic. Extraocular muscles are intact. Pupils equal and reactive to light. Sclerae anicteric. No conjunctival injection. No oro-pharyngeal erythema.  NECK: Supple. There is no jugular venous distention. No bruits, no lymphadenopathy, no thyromegaly.  HEART: Regular rate and rhythm,. No  murmurs, no rubs, no clicks.  LUNGS: Clear to auscultation bilaterally. No rales or rhonchi. No wheezes.  ABDOMEN: Soft, flat, nontender, nondistended. Has good bowel sounds. No hepatosplenomegaly appreciated.  EXTREMITIES: No evidence of any cyanosis, clubbing, or peripheral edema.  +2 pedal and radial pulses bilaterally.  NEUROLOGIC: The patient is alert, awake, and oriented x3 with no focal motor or sensory deficits appreciated bilaterally.  SKIN: Moist and warm with no rashes appreciated.  Psych: Not anxious, depressed LN: No inguinal LN enlargement    Antibiotics   Anti-infectives    None      Medications   Scheduled Meds: . amiodarone  400 mg Oral BID  . aspirin EC  81 mg Oral QHS  . calcium-vitamin D  1 tablet Oral Daily  . docusate sodium  100 mg Oral Daily  . feeding supplement (ENSURE ENLIVE)  237 mL Oral BID BM  . furosemide  40 mg Intravenous Q12H  . latanoprost  1 drop Both Eyes QHS  . metoprolol succinate  25 mg Oral q morning - 10a  . pantoprazole  40 mg Oral Daily  . potassium chloride  30 mEq Oral Daily  . sertraline  50 mg Oral QHS  . simvastatin  20 mg Oral QHS  . sodium chloride flush  3 mL Intravenous Q12H  . sodium chloride  1 g Oral BID WC  . timolol  1 drop Both Eyes BID   Continuous Infusions:  PRN Meds:.sodium chloride, acetaminophen, nitroGLYCERIN, ondansetron (ZOFRAN) IV, sodium chloride flush   Data Review:   Micro  Results No results found for this or any previous visit (from the past 240 hour(s)).  Radiology Reports Dg Chest 1 View  07/05/2015  CLINICAL DATA:  Bilateral pleural effusions. Status post left thoracentesis. EXAM: CHEST 1 VIEW COMPARISON:  Chest x-rays dated 07/04/2015 and 07/02/2015 FINDINGS: There is no pneumothorax after left thoracentesis. Decreased left pleural effusion although some of fluid remains at the left base. Small right effusion is unchanged. Pulmonary vascularity is normal. Aortic atherosclerosis. Thoracic scoliosis and osteopenia and degenerative changes of the shoulders are unchanged. IMPRESSION: No pneumothorax after left thoracentesis. Decreased left pleural effusion. Aortic atherosclerosis. Electronically Signed   By: Lorriane Shire M.D.   On: 07/05/2015 10:00   Dg Chest 2 View  07/15/2015  CLINICAL DATA:  Acute onset of right shoulder pain and shortness of breath. Sharp chest pain. Initial encounter. EXAM: CHEST  2 VIEW COMPARISON:  Chest radiograph performed 07/05/2015 FINDINGS: Small bilateral pleural effusions are noted, left greater than right, with bibasilar airspace opacities likely reflecting atelectasis. No pneumothorax is seen. The heart is borderline normal in size. No acute osseous abnormalities are identified. Mild right convex thoracic scoliosis is noted. IMPRESSION: 1. Small bilateral pleural effusions, left greater than right, with bibasilar airspace opacities likely reflecting atelectasis. 2. Mild right convex thoracic scoliosis noted. Electronically Signed   By: Garald Balding M.D.   On: 07/15/2015 00:12   Dg Chest 2 View  07/04/2015  CLINICAL DATA:  Follow-up pleural effusions. EXAM: CHEST  2 VIEW COMPARISON:  07/02/2015. FINDINGS: Pain no significant change in a moderate to large-sized left pleural effusion and small right pleural effusion. Adjacent left basilar and minimal right basilar airspace opacity is also unchanged. The cardiac silhouette remains enlarged.  Diffuse osteopenia. Bilateral shoulder degenerative changes. IMPRESSION: No significant change in bilateral pleural effusions and adjacent atelectasis and/or pneumonia. Electronically Signed   By: Claudie Revering M.D.   On: 07/04/2015 14:44   Dg Chest 2  View  07/02/2015  CLINICAL DATA:  Chest pain for 2 weeks EXAM: CHEST  2 VIEW COMPARISON:  None. FINDINGS: Cardiac shadow is enlarged. Aortic calcifications are noted. A small right-sided pleural effusion is seen. Large left-sided pleural effusion and likely underlying infiltrate is present. This is new from the prior exam. No acute bony abnormality is seen. IMPRESSION: Bilateral pleural effusions left greater than right. There is likely underlying left basilar infiltrate present as well. Electronically Signed   By: Inez Catalina M.D.   On: 07/02/2015 18:21   Dg Chest 2 View  06/18/2015  CLINICAL DATA:  Acute onset of generalized chest pain, radiating to the neck and back. Initial encounter. EXAM: CHEST  2 VIEW COMPARISON:  Chest radiograph performed 07/26/2014 FINDINGS: The lungs are well-aerated and clear. There is no evidence of focal opacification, pleural effusion or pneumothorax. The heart is borderline enlarged. No acute osseous abnormalities are seen. Chronic compression deformities are noted along the lower thoracic and upper lumbar spine, with changes of vertebroplasty at the upper lumbar spine. IMPRESSION: Borderline cardiomegaly.  Lungs remain grossly clear. Electronically Signed   By: Garald Balding M.D.   On: 06/18/2015 23:04   Ct Angio Chest Pe W/cm &/or Wo Cm  06/19/2015  CLINICAL DATA:  Acute onset of generalized chest pain, radiating to the neck and back. Initial encounter. EXAM: CT ANGIOGRAPHY CHEST WITH CONTRAST TECHNIQUE: Multidetector CT imaging of the chest was performed using the standard protocol during bolus administration of intravenous contrast. Multiplanar CT image reconstructions and MIPs were obtained to evaluate the vascular anatomy.  CONTRAST:  15mL OMNIPAQUE IOHEXOL 350 MG/ML SOLN COMPARISON:  Chest radiograph performed 06/18/2015 FINDINGS: There is no evidence of aortic dissection. There is distention of the ascending thoracic aorta to 4.4 cm in maximal diameter, though this is within the normal range given the patient's age. Scattered calcification is noted along the aortic arch and descending thoracic aorta. Scattered calcification is noted at the aortic valve. There is no evidence of pulmonary embolus. Minimal bibasilar atelectasis is noted. Slight interstitial prominence is noted at the lung apices. There is no evidence of significant focal consolidation, pleural effusion or pneumothorax. No masses are identified; no abnormal focal contrast enhancement is seen. The heart is mildly enlarged. Trace pericardial fluid remains within normal limits. Visualized mediastinal nodes are grossly unremarkable. No pericardial effusion is identified. No axillary lymphadenopathy is seen. The visualized portions of the thyroid gland are unremarkable in appearance. The visualized portions of the liver and spleen are unremarkable. No acute osseous abnormalities are seen. Degenerative change is noted at the left glenohumeral joint. There is slight chronic loss of height at multiple levels along the thoracic spine. Review of the MIP images confirms the above findings. IMPRESSION: 1. No evidence of aortic dissection. Distention of the ascending thoracic aorta to 4.4 cm in maximal diameter, still within the normal range given the patient's age. 2. Mild cardiomegaly. Scattered calcification along the aortic arch and descending thoracic aorta. Scattered calcification at the aortic valve. 3. Minimal bibasilar atelectasis noted. Slight interstitial prominence the lung apices. 4. Degenerative change at the left glenohumeral joint. Electronically Signed   By: Garald Balding M.D.   On: 06/19/2015 01:03   US Thoracentesis Asp Pleural Space W/img Guide  07/05/2015   INDICATION: Symptomatic left sided pleural effusion - please perform ultrasound-guided thoracentesis for therapeutic and diagnostic purposes. EXAM: US THORACENTESIS ASP PLEURAL SPACE W/IMG GUIDE COMPARISON:  Chest radiograph- 07/04/2015; 07/01/2025 MEDICATIONS: None. COMPLICATIONS: None immediate. TECHNIQUE: Informed written consent  was obtained from the patient after a discussion of the risks, benefits and alternatives to treatment. A timeout was performed prior to the initiation of the procedure. Initial ultrasound scanning demonstrates a moderate-sized minimally complex left-sided pleural effusion. The lower chest was prepped and draped in the usual sterile fashion. 1% lidocaine was used for local anesthesia. An ultrasound image was saved for documentation purposes. An 8 Fr Safe-T-Centesis catheter was introduced. The thoracentesis was performed. The catheter was removed and a dressing was applied. The patient tolerated the procedure well without immediate post procedural complication. The patient was escorted to have an upright chest radiograph. FINDINGS: A total of approximately 600 cc of serous fluid was removed. Requested samples were sent to the laboratory. IMPRESSION: Successful ultrasound-guided left sided thoracentesis yielding 600 cc of pleural fluid. Electronically Signed   By: Sandi Mariscal M.D.   On: 07/05/2015 10:14     CBC  Recent Labs Lab 07/14/15 2331 07/15/15 0530  WBC 13.1* 13.1*  HGB 12.8 11.8*  HCT 38.1 35.6  PLT 332 325  MCV 87.5 88.7  MCH 29.3 29.5  MCHC 33.5 33.2  RDW 13.2 13.1    Chemistries   Recent Labs Lab 07/14/15 2331 07/15/15 0530  NA 132* 133*  K 4.2 3.6  CL 93* 91*  CO2 31 34*  GLUCOSE 128* 120*  BUN 26* 25*  CREATININE 1.03* 0.98  CALCIUM 8.3* 8.2*   ------------------------------------------------------------------------------------------------------------------ estimated creatinine clearance is 40.7 mL/min (by C-G formula based on Cr of  0.98). ------------------------------------------------------------------------------------------------------------------ No results for input(s): HGBA1C in the last 72 hours. ------------------------------------------------------------------------------------------------------------------ No results for input(s): CHOL, HDL, LDLCALC, TRIG, CHOLHDL, LDLDIRECT in the last 72 hours. ------------------------------------------------------------------------------------------------------------------ No results for input(s): TSH, T4TOTAL, T3FREE, THYROIDAB in the last 72 hours.  Invalid input(s): FREET3 ------------------------------------------------------------------------------------------------------------------ No results for input(s): VITAMINB12, FOLATE, FERRITIN, TIBC, IRON, RETICCTPCT in the last 72 hours.  Coagulation profile No results for input(s): INR, PROTIME in the last 168 hours.  No results for input(s): DDIMER in the last 72 hours.  Cardiac Enzymes  Recent Labs Lab 07/15/15 0530 07/15/15 1150 07/15/15 1626  TROPONINI <0.03 <0.03 <0.03   ------------------------------------------------------------------------------------------------------------------ Invalid input(s): POCBNP    Assessment & Plan   1.. Chest pain Cardiac cath on Monday cardiology wants to watch her during the week  2. Mild pleural effusion on need to have thoracentesis again minimal continue Lasix therapy 3. Chronic atrial fibrillation continue amiodarone and aspirin continue Eliquis 4. Hyperlipidemia unspecified continue Zocor 5. History of colon cancer 6. Chronic hyponatremia continue to monitor BMPs 7. Acute diastolic CHF exasperation- continue Lasix BMP in the morning    Code Status Orders        Start     Ordered   07/15/15 0439  Full code   Continuous     07/15/15 0439    Code Status History    Date Active Date Inactive Code Status Order ID Comments User Context   07/02/2015  7:03 PM  07/09/2015  6:12 PM Full Code [id number]  Lytle Butte, MD ED   06/19/2015  3:15 AM 06/20/2015  4:31 PM Full Code [id number]  Saundra Shelling, MD Inpatient    Advance Directive Documentation        Most Recent Value   Type of Advance Directive  Living will, Healthcare Power of Attorney   Pre-existing out of facility DNR order (yellow form or pink MOST form)     "MOST" Form in Place?  Consults Cardiology   DVT Prophylaxis Eliquis  Lab Results  Component Value Date   PLT 325 07/15/2015     Time Spent in minutes   98min  Greater than 50% of time spent in care coordination and counseling patient regarding the condition and plan of care.   Dustin Flock M.D on 07/16/2015 at 9:02 AM  Between 7am to 6pm - Pager - [phone]  After 6pm go to [email or web address] - password [email or web address] [facility]   Office  [phone]

## 2015-07-16 NOTE — Consult Note (Signed)
[facility] Cardiology  SUBJECTIVE: I don't have chest pain   Filed Vitals:   07/15/15 1127 07/15/15 2051 07/16/15 0534 07/16/15 0728  BP: 116/54 106/59 118/51 114/54  Pulse: 66 70 63 65  Temp: 98.2 F (36.8 C) 98.2 F (36.8 C) 98.2 F (36.8 C) 97.7 F (36.5 C)  TempSrc:  Oral Oral Oral  Resp: 18 20 20 17   Height:      Weight:      SpO2: 98% 97% 96% 99%     Intake/Output Summary (Last 24 hours) at 07/16/15 0913 Last data filed at 07/16/15 [id number]  Gross per 24 hour  Intake      6 ml  Output   1675 ml  Net  -1669 ml      PHYSICAL EXAM  General: Well developed, well nourished, in no acute distress HEENT:  Normocephalic and atramatic Neck:  No JVD.  Lungs: Clear bilaterally to auscultation and percussion. Heart: HRRR . Normal S1 and S2 without gallops or murmurs.  Abdomen: Bowel sounds are positive, abdomen soft and non-tender  Msk:  Back normal, normal gait. Normal strength and tone for age. Extremities: No clubbing, cyanosis or edema.   Neuro: Alert and oriented X 3. Psych:  Good affect, responds appropriately   LABS: Basic Metabolic Panel:  Recent Labs  07/14/15 2331 07/15/15 0530  NA 132* 133*  K 4.2 3.6  CL 93* 91*  CO2 31 34*  GLUCOSE 128* 120*  BUN 26* 25*  CREATININE 1.03* 0.98  CALCIUM 8.3* 8.2*   Liver Function Tests: No results for input(s): AST, ALT, ALKPHOS, BILITOT, PROT, ALBUMIN in the last 72 hours. No results for input(s): LIPASE, AMYLASE in the last 72 hours. CBC:  Recent Labs  07/14/15 2331 07/15/15 0530  WBC 13.1* 13.1*  HGB 12.8 11.8*  HCT 38.1 35.6  MCV 87.5 88.7  PLT 332 325   Cardiac Enzymes:  Recent Labs  07/15/15 0530 07/15/15 1150 07/15/15 1626  TROPONINI <0.03 <0.03 <0.03   BNP: Invalid input(s): POCBNP D-Dimer: No results for input(s): DDIMER in the last 72 hours. Hemoglobin A1C: No results for input(s): HGBA1C in the last 72 hours. Fasting Lipid Panel: No results for input(s): CHOL, HDL, LDLCALC, TRIG,  CHOLHDL, LDLDIRECT in the last 72 hours. Thyroid Function Tests: No results for input(s): TSH, T4TOTAL, T3FREE, THYROIDAB in the last 72 hours.  Invalid input(s): FREET3 Anemia Panel: No results for input(s): VITAMINB12, FOLATE, FERRITIN, TIBC, IRON, RETICCTPCT in the last 72 hours.  Dg Chest 2 View  07/15/2015  CLINICAL DATA:  Acute onset of right shoulder pain and shortness of breath. Sharp chest pain. Initial encounter. EXAM: CHEST  2 VIEW COMPARISON:  Chest radiograph performed 07/05/2015 FINDINGS: Small bilateral pleural effusions are noted, left greater than right, with bibasilar airspace opacities likely reflecting atelectasis. No pneumothorax is seen. The heart is borderline normal in size. No acute osseous abnormalities are identified. Mild right convex thoracic scoliosis is noted. IMPRESSION: 1. Small bilateral pleural effusions, left greater than right, with bibasilar airspace opacities likely reflecting atelectasis. 2. Mild right convex thoracic scoliosis noted. Electronically Signed   By: Garald Balding M.D.   On: 07/15/2015 00:12     Echo   TELEMETRY: Normal sinus rhythm:  ASSESSMENT AND PLAN:  Principal Problem:   Chest pain at rest Active Problems:   Dyspnea    1. Chest pain, pleuritic in nature, negative troponin, nondiagnostic ECG 2. Recurrent pleural effusion, associated with peripheral edema, shortness of breath, and possible diastolic congestive heart failure  3. Moderate severe aortic stenosis 4. Paroxysmal atrial fibrillation  Recommendations  1. Agree with overall current therapy 2. Defer full dose anticoagulation 3. Hold Eliquis 4. Right and left heart cardiac catheterization on 07/19/2015   Isaias Cowman, MD, PhD, [facility] 07/16/2015 9:13 AM

## 2015-07-16 NOTE — Consult Note (Addendum)
   [facility] CM Inpatient Consult   07/16/2015  Caroline Aguilar 04-14-1926 [id number]  Spoke with inpatient case manager in progression rounds, patient with diagnosis of Afib, and pneumonia and is eligible for post hospital discharge follow up. Patient was evaluated for community based chronic disease management services with [facility] care Management Program as a benefit of patient's [facility] Medicare. Met with the patient and daughter in law at the bedside to explain [place] Management services. Patient endorses her primary care provider to be Dr. Emily Filbert. Patient states she was previously receiving [place] services but cancelled them when she got home. Consent form signed.  Patient stated her best contact number is [phone] and consented for her son Caroline Aguilar also be contacted [phone]. Patient will receive post hospital discharge calls and be evaluated for monthly home visits. [facility] Care Management services does not interfere with or replace any services arranged by the inpatient care management team. RNCM left contact information and [facility] literature at the bedside. Made inpatient RNCM aware that [facility] will be following for care management. For additional questions please contact:   Kiante Petrovich RN, [place] [facility] Liaison  [phone]) Business Mobile [phone]) Toll free office

## 2015-07-16 NOTE — Care Management (Signed)
Cardiac Cath for Monday

## 2015-07-16 NOTE — Plan of Care (Signed)
Problem: Safety: Goal: Ability to remain free from injury will improve Outcome: Progressing Fall precautions in place  Problem: Pain Managment: Goal: General experience of comfort will improve Outcome: Progressing Prn medications

## 2015-07-16 NOTE — Progress Notes (Signed)
[facility]  ROUNDING NOTE   Subjective:   Family at bedside. Reports no more shortness of breath. No labs today  Objective:  Vital signs in last 24 hours:  Temp:  [97.7 F (36.5 C)-98.2 F (36.8 C)] 97.7 F (36.5 C) (03/31 0728) Pulse Rate:  [63-70] 65 (03/31 0728) Resp:  [17-20] 17 (03/31 0728) BP: (106-118)/(51-59) 114/54 mmHg (03/31 0728) SpO2:  [96 %-99 %] 99 % (03/31 0728)  Weight change:  Filed Weights   07/14/15 2307 07/15/15 0522  Weight: 75.297 kg (166 lb) 74.027 kg (163 lb 3.2 oz)    Intake/Output: I/O last 3 completed shifts: In: 52 [P.O.:120; I.V.:3] Out: 1475 [Urine:1475]   Intake/Output this shift:  Total I/O In: 3 [I.V.:3] Out: 600 [Urine:600]  Physical Exam: General: NAD, resting in bed  Head: Normocephalic, atraumatic. Moist oral mucosal membranes  Eyes: Anicteric  Neck: Supple, trachea midline  Lungs:  clear  Heart: S1S2 no rubs  Abdomen:  Soft, nontender, BS present  Extremities: Trace peripheral and dependent edema.  Neurologic: Nonfocal, moving all four extremities  Skin: No lesions       Basic Metabolic Panel:  Recent Labs Lab 07/14/15 2331 07/15/15 0530  NA 132* 133*  K 4.2 3.6  CL 93* 91*  CO2 31 34*  GLUCOSE 128* 120*  BUN 26* 25*  CREATININE 1.03* 0.98  CALCIUM 8.3* 8.2*    Liver Function Tests: No results for input(s): AST, ALT, ALKPHOS, BILITOT, PROT, ALBUMIN in the last 168 hours. No results for input(s): LIPASE, AMYLASE in the last 168 hours. No results for input(s): AMMONIA in the last 168 hours.  CBC:  Recent Labs Lab 07/14/15 2331 07/15/15 0530  WBC 13.1* 13.1*  HGB 12.8 11.8*  HCT 38.1 35.6  MCV 87.5 88.7  PLT 332 325    Cardiac Enzymes:  Recent Labs Lab 07/14/15 2331 07/15/15 0530 07/15/15 1150 07/15/15 1626  TROPONINI <0.03 <0.03 <0.03 <0.03    BNP: Invalid input(s): POCBNP  CBG: No results for input(s): GLUCAP in the last 168 hours.  Microbiology: Results for orders  placed or performed during the hospital encounter of 07/02/15  Blood culture (routine x 2)     Status: None   Collection Time: 07/02/15  4:13 PM  Result Value Ref Range Status   Specimen Description BLOOD RT [facility]  Final   Special Requests BOTTLES DRAWN AEROBIC AND ANAEROBIC 5CC  Final   Culture NO GROWTH 5 DAYS  Final   Report Status 07/07/2015 FINAL  Final  Blood culture (routine x 2)     Status: None   Collection Time: 07/02/15  4:14 PM  Result Value Ref Range Status   Specimen Description BLOOD LT [facility]  Final   Special Requests BOTTLES DRAWN AEROBIC AND ANAEROBIC [id number] [facility]  Final   Culture NO GROWTH 5 DAYS  Final   Report Status 07/07/2015 FINAL  Final  Urine culture     Status: None   Collection Time: 07/02/15  6:24 PM  Result Value Ref Range Status   Specimen Description URINE, RANDOM  Final   Special Requests NONE  Final   Culture >=100,000 COLONIES/mL ESCHERICHIA COLI  Final   Report Status 07/05/2015 FINAL  Final   Organism ID, Bacteria ESCHERICHIA COLI  Final      Susceptibility   Escherichia coli - MIC*    AMPICILLIN >=32 RESISTANT Resistant     CEFAZOLIN <=4 SENSITIVE Sensitive     CEFTRIAXONE <=1 SENSITIVE Sensitive     CIPROFLOXACIN >=4  RESISTANT Resistant     GENTAMICIN <=1 SENSITIVE Sensitive     IMIPENEM <=0.25 SENSITIVE Sensitive     NITROFURANTOIN <=16 SENSITIVE Sensitive     TRIMETH/SULFA >=320 RESISTANT Resistant     AMPICILLIN/SULBACTAM 16 INTERMEDIATE Intermediate     PIP/TAZO <=4 SENSITIVE Sensitive     Extended ESBL NEGATIVE Sensitive     * >=100,000 COLONIES/mL ESCHERICHIA COLI  Culture, body fluid-bottle     Status: None   Collection Time: 07/05/15 10:05 AM  Result Value Ref Range Status   Specimen Description PLEURAL  Final   Special Requests NONE  Final   Culture NO GROWTH 5 DAYS  Final   Report Status 07/10/2015 FINAL  Final    Coagulation Studies: No results for input(s): LABPROT, INR in the last 72 hours.  Urinalysis: No results for  input(s): COLORURINE, LABSPEC, PHURINE, GLUCOSEU, HGBUR, BILIRUBINUR, KETONESUR, PROTEINUR, UROBILINOGEN, NITRITE, LEUKOCYTESUR in the last 72 hours.  Invalid input(s): APPERANCEUR    Imaging: Dg Chest 2 View  07/15/2015  CLINICAL DATA:  Acute onset of right shoulder pain and shortness of breath. Sharp chest pain. Initial encounter. EXAM: CHEST  2 VIEW COMPARISON:  Chest radiograph performed 07/05/2015 FINDINGS: Small bilateral pleural effusions are noted, left greater than right, with bibasilar airspace opacities likely reflecting atelectasis. No pneumothorax is seen. The heart is borderline normal in size. No acute osseous abnormalities are identified. Mild right convex thoracic scoliosis is noted. IMPRESSION: 1. Small bilateral pleural effusions, left greater than right, with bibasilar airspace opacities likely reflecting atelectasis. 2. Mild right convex thoracic scoliosis noted. Electronically Signed   By: Garald Balding M.D.   On: 07/15/2015 00:12     Medications:     . amiodarone  400 mg Oral BID  . aspirin EC  81 mg Oral QHS  . calcium-vitamin D  1 tablet Oral Daily  . docusate sodium  100 mg Oral Daily  . feeding supplement (ENSURE ENLIVE)  237 mL Oral BID BM  . furosemide  40 mg Intravenous Q12H  . latanoprost  1 drop Both Eyes QHS  . metoprolol succinate  25 mg Oral q morning - 10a  . pantoprazole  40 mg Oral Daily  . potassium chloride  30 mEq Oral Daily  . sertraline  50 mg Oral QHS  . simvastatin  20 mg Oral QHS  . sodium chloride flush  3 mL Intravenous Q12H  . sodium chloride  1 g Oral BID WC  . timolol  1 drop Both Eyes BID   sodium chloride, acetaminophen, nitroGLYCERIN, ondansetron (ZOFRAN) IV, sodium chloride flush  Assessment/ Plan:  82 y.o.white  female with a PMHx of hyperlipidemia, colon cancer, right carotid artery stenosis, glaucoma, peripheral neuropathy, history of left chest shingles who was admitted to [facility] on 07/02/2015 for evaluation of weakness and  shortness of breath.   1. Hyponatremia: with moderate aortic valve stenosis. Secondary to congestive heart failure - Agree with furosemide and fluid restriction - Sodium at goal: >130  2. Chronic Kidney Disease stage III: creatinine at baseline. Kidney function does fluctuate with her volume status and diuretic therapy - renally dose medications, monitor closely with IV contrast exposure  3. Hypertension with atrial fibrillation with edema: blood pressure at goal. Irregular rate - furosemide and metoprolol - hold triameterene and hydrochlorothiazide   LOS: 1 Caroline Aguilar 3/31/201710:48 AM

## 2015-07-17 NOTE — Progress Notes (Signed)
Patient has 2.22 seconds pauses and frequent PVCs. Dr. Leslye Peer notified and he D/C his Metoprolol. Dr. Leslye Peer indicated the RN should consult Dr. Saralyn Pilar as to what he wants to do for her Amiodarone. Dr. Saralyn Pilar was notified and he indicated he's not going to do anything to the amiodarone and it should be administered as scheduled. Patient is asymptomatic. Staff will continue to monitor.

## 2015-07-17 NOTE — Plan of Care (Signed)
Problem: Activity: Goal: Risk for activity intolerance will decrease Outcome: Progressing Pt ambulated 7ft in the hall.

## 2015-07-17 NOTE — Progress Notes (Signed)
Patient ID: Caroline Aguilar, female   DOB: 09/23/1925, 82 y.o.   MRN: [id number] [facility] PROGRESS NOTE  Caroline Aguilar [id number] DOB: [birth year] DOA: 07/14/2015 PCP: [name], MD  HPI/Subjective: Patient feeling chest pain right-sided. This is less than yesterday. Still with some shortness of breath.  Objective: Filed Vitals:   07/17/15 0628 07/17/15 1127  BP: 111/57 104/46  Pulse: 64 67  Temp: 97.6 F (36.4 C) 98.3 F (36.8 C)  Resp: 19 22    Filed Weights   07/14/15 2307 07/15/15 0522  Weight: 75.297 kg (166 lb) 74.027 kg (163 lb 3.2 oz)    ROS: Review of Systems  Constitutional: Negative for fever and chills.  Eyes: Negative for blurred vision.  Respiratory: Positive for shortness of breath. Negative for cough.   Cardiovascular: Positive for chest pain.  Gastrointestinal: Negative for nausea, vomiting, abdominal pain, diarrhea and constipation.  Genitourinary: Negative for dysuria.  Musculoskeletal: Negative for joint pain.  Neurological: Negative for dizziness and headaches.   Exam: Physical Exam  Constitutional: She is oriented to person, place, and time.  HENT:  Nose: No mucosal edema.  Mouth/Throat: No oropharyngeal exudate or posterior oropharyngeal edema.  Eyes: Conjunctivae, EOM and lids are normal. Pupils are equal, round, and reactive to light.  Neck: No JVD present. Carotid bruit is not present. No edema present. No thyroid mass and no thyromegaly present.  Cardiovascular: S1 normal and S2 normal.  Exam reveals no gallop.   Murmur heard.  Systolic murmur is present with a grade of 2/6  Pulses:      Dorsalis pedis pulses are 2+ on the right side, and 2+ on the left side.  Respiratory: No respiratory distress. She has decreased breath sounds in the right lower field and the left lower field. She has no wheezes. She has no rhonchi. She has no rales.  GI: Soft. Bowel sounds are normal. There is no tenderness.  Musculoskeletal:      Right ankle: She exhibits no swelling.       Left ankle: She exhibits no swelling.  Lymphadenopathy:    She has no cervical adenopathy.  Neurological: She is alert and oriented to person, place, and time. No cranial nerve deficit.  Skin: Skin is warm. No rash noted. Nails show no clubbing.  Psychiatric: She has a normal mood and affect.      Data Reviewed: Basic Metabolic Panel:  Recent Labs Lab 07/14/15 2331 07/15/15 0530  NA 132* 133*  K 4.2 3.6  CL 93* 91*  CO2 31 34*  GLUCOSE 128* 120*  BUN 26* 25*  CREATININE 1.03* 0.98  CALCIUM 8.3* 8.2*   LCBC:  Recent Labs Lab 07/14/15 2331 07/15/15 0530  WBC 13.1* 13.1*  HGB 12.8 11.8*  HCT 38.1 35.6  MCV 87.5 88.7  PLT 332 325   Cardiac Enzymes:  Recent Labs Lab 07/14/15 2331 07/15/15 0530 07/15/15 1150 07/15/15 1626  TROPONINI <0.03 <0.03 <0.03 <0.03   BNP (last 3 results)  Recent Labs  06/17/15 2230  BNP 244.0*   Scheduled Meds: . amiodarone  400 mg Oral BID  . aspirin EC  81 mg Oral QHS  . calcium-vitamin D  1 tablet Oral Daily  . docusate sodium  100 mg Oral Daily  . feeding supplement (ENSURE ENLIVE)  237 mL Oral BID BM  . furosemide  40 mg Intravenous Q12H  . latanoprost  1 drop Both Eyes QHS  . pantoprazole  40 mg Oral Daily  . potassium  chloride  30 mEq Oral Daily  . sertraline  50 mg Oral QHS  . simvastatin  20 mg Oral QHS  . sodium chloride flush  3 mL Intravenous Q12H  . sodium chloride  1 g Oral BID WC  . timolol  1 drop Both Eyes BID    Assessment/Plan:  1. Chest pain. Cardiac enzymes are negative. Cardiology wants to do a cardiac catheterization on Monday. 2. Chronic atrial fibrillation. Eliquis on hold for cardiac catheterization. On aspirin and amiodarone 3. Mild pleural effusion. On IV Lasix 4. Hyperlipidemia unspecified continue Zocor 5. History of colon cancer 6. Chronic hyponatremia. Sodium 133 today.   Code Status:     Code Status Orders        Start      Ordered   07/15/15 0439  Full code   Continuous     07/15/15 0439    Code Status History    Date Active Date Inactive Code Status Order ID Comments User Context   07/02/2015  7:03 PM 07/09/2015  6:12 PM Full Code [id number]  Lytle Butte, MD ED   06/19/2015  3:15 AM 06/20/2015  4:31 PM Full Code [id number]  Saundra Shelling, MD Inpatient    Advance Directive Documentation        Most Recent Value   Type of Advance Directive  Living will, Healthcare Power of Attorney   Pre-existing out of facility DNR order (yellow form or pink MOST form)     "MOST" Form in Place?       Disposition Plan: Potentially home Monday afternoon  Consultants:  Cardiology  Nephrology  Time spent: 25 minutes  [name]  [facility]

## 2015-07-17 NOTE — Progress Notes (Signed)
[facility] Cardiology  SUBJECTIVE: I don't have chest pain   Filed Vitals:   07/16/15 0728 07/16/15 1121 07/16/15 1958 07/17/15 0628  BP: 114/54 109/48 115/54 111/57  Pulse: 65 62 69 64  Temp: 97.7 F (36.5 C) 98.2 F (36.8 C) 98.5 F (36.9 C) 97.6 F (36.4 C)  TempSrc: Oral  Oral Oral  Resp: 17 18 21 19   Height:      Weight:      SpO2: 99% 91% 99% 98%     Intake/Output Summary (Last 24 hours) at 07/17/15 0925 Last data filed at 07/17/15 0744  Gross per 24 hour  Intake    240 ml  Output   1350 ml  Net  -1110 ml      PHYSICAL EXAM  General: Well developed, well nourished, in no acute distress HEENT:  Normocephalic and atramatic Neck:  No JVD.  Lungs: Clear bilaterally to auscultation and percussion. Heart: HRRR . Normal S1 and S2 without gallops or murmurs.  Abdomen: Bowel sounds are positive, abdomen soft and non-tender  Msk:  Back normal, normal gait. Normal strength and tone for age. Extremities: No clubbing, cyanosis or edema.   Neuro: Alert and oriented X 3. Psych:  Good affect, responds appropriately   LABS: Basic Metabolic Panel:  Recent Labs  07/14/15 2331 07/15/15 0530  NA 132* 133*  K 4.2 3.6  CL 93* 91*  CO2 31 34*  GLUCOSE 128* 120*  BUN 26* 25*  CREATININE 1.03* 0.98  CALCIUM 8.3* 8.2*   Liver Function Tests: No results for input(s): AST, ALT, ALKPHOS, BILITOT, PROT, ALBUMIN in the last 72 hours. No results for input(s): LIPASE, AMYLASE in the last 72 hours. CBC:  Recent Labs  07/14/15 2331 07/15/15 0530  WBC 13.1* 13.1*  HGB 12.8 11.8*  HCT 38.1 35.6  MCV 87.5 88.7  PLT 332 325   Cardiac Enzymes:  Recent Labs  07/15/15 0530 07/15/15 1150 07/15/15 1626  TROPONINI <0.03 <0.03 <0.03   BNP: Invalid input(s): POCBNP D-Dimer: No results for input(s): DDIMER in the last 72 hours. Hemoglobin A1C: No results for input(s): HGBA1C in the last 72 hours. Fasting Lipid Panel: No results for input(s): CHOL, HDL, LDLCALC, TRIG,  CHOLHDL, LDLDIRECT in the last 72 hours. Thyroid Function Tests: No results for input(s): TSH, T4TOTAL, T3FREE, THYROIDAB in the last 72 hours.  Invalid input(s): FREET3 Anemia Panel: No results for input(s): VITAMINB12, FOLATE, FERRITIN, TIBC, IRON, RETICCTPCT in the last 72 hours.  No results found.   Echo normal left ventricular function with LVEF of 50-55%  TELEMETRY: Normal sinus rhythm with episodes of Mobitz 1 second-degree AV block:  ASSESSMENT AND PLAN:  Principal Problem:   Chest pain at rest Active Problems:   Dyspnea    1. Chest pain, pleuritic in nature, negative troponin, nondiagnostic ECG 2. Recurrent pleural effusion, associated with peripheral edema, shortness of breath, and there are 2 possible diastolic congestive heart failure 3. Moderate to severe aortic stenosis 4. Paroxysmal atrial fibrillation 5. Episodic Mobitz 1 second-degree AV block  Recommendations  1. Hold metoprolol 2. Continue amiodarone 3. Hold Eliquis 4. Right and left heart cardiac catheterization 07/19/2015   Isaias Cowman, MD, PhD, [facility] 07/17/2015 9:25 AM

## 2015-07-17 NOTE — Progress Notes (Signed)
Per Dr. Leslye Peer okay to d/c bedrest orders. MD would like to the patient to ambulate in the hall. Will continue to monitor patient. Horton Finer

## 2015-07-18 LAB — RENAL FUNCTION PANEL
Albumin: 2.8 g/dL — ABNORMAL LOW (ref 3.5–5.0)
Anion gap: 8 (ref 5–15)
BUN: 38 mg/dL — ABNORMAL HIGH (ref 6–20)
CALCIUM: 8.5 mg/dL — AB (ref 8.9–10.3)
CO2: 34 mmol/L — AB (ref 22–32)
Chloride: 92 mmol/L — ABNORMAL LOW (ref 101–111)
Creatinine, Ser: 0.72 mg/dL (ref 0.44–1.00)
Glucose, Bld: 128 mg/dL — ABNORMAL HIGH (ref 65–99)
Phosphorus: 3.9 mg/dL (ref 2.5–4.6)
Potassium: 3.8 mmol/L (ref 3.5–5.1)
SODIUM: 134 mmol/L — AB (ref 135–145)

## 2015-07-18 MED ORDER — POLYETHYLENE GLYCOL 3350 17 G PO PACK
17.0000 g | PACK | Freq: Every day | ORAL | Status: DC
  Administered 2015-07-18 – 2015-07-20 (×3): 17 g via ORAL
  Filled 2015-07-18 (×3): qty 1

## 2015-07-18 NOTE — Progress Notes (Signed)
[facility] Cardiology  SUBJECTIVE: I feel better   Filed Vitals:   07/17/15 2008 07/18/15 0423 07/18/15 0628 07/18/15 1221  BP: 100/48 114/59  113/42  Pulse: 79 71  69  Temp: 98.9 F (37.2 C) 97.6 F (36.4 C)  98.3 F (36.8 C)  TempSrc: Oral Oral  Oral  Resp: 16 18  20   Height:      Weight:      SpO2: 94% 90% 92% 94%     Intake/Output Summary (Last 24 hours) at 07/18/15 1352 Last data filed at 07/18/15 1042  Gross per 24 hour  Intake    600 ml  Output   1200 ml  Net   -600 ml      PHYSICAL EXAM  General: Well developed, well nourished, in no acute distress HEENT:  Normocephalic and atramatic Neck:  No JVD.  Lungs: Clear bilaterally to auscultation and percussion. Heart: HRRR . Normal S1 and S2 without gallops or murmurs.  Abdomen: Bowel sounds are positive, abdomen soft and non-tender  Msk:  Back normal, normal gait. Normal strength and tone for age. Extremities: No clubbing, cyanosis or edema.   Neuro: Alert and oriented X 3. Psych:  Good affect, responds appropriately   LABS: Basic Metabolic Panel:  Recent Labs  07/18/15 0534  NA 134*  K 3.8  CL 92*  CO2 34*  GLUCOSE 128*  BUN 38*  CREATININE 0.72  CALCIUM 8.5*  PHOS 3.9   Liver Function Tests:  Recent Labs  07/18/15 0534  ALBUMIN 2.8*   No results for input(s): LIPASE, AMYLASE in the last 72 hours. CBC: No results for input(s): WBC, NEUTROABS, HGB, HCT, MCV, PLT in the last 72 hours. Cardiac Enzymes:  Recent Labs  07/15/15 1626  TROPONINI <0.03   BNP: Invalid input(s): POCBNP D-Dimer: No results for input(s): DDIMER in the last 72 hours. Hemoglobin A1C: No results for input(s): HGBA1C in the last 72 hours. Fasting Lipid Panel: No results for input(s): CHOL, HDL, LDLCALC, TRIG, CHOLHDL, LDLDIRECT in the last 72 hours. Thyroid Function Tests: No results for input(s): TSH, T4TOTAL, T3FREE, THYROIDAB in the last 72 hours.  Invalid input(s): FREET3 Anemia Panel: No results for input(s):  VITAMINB12, FOLATE, FERRITIN, TIBC, IRON, RETICCTPCT in the last 72 hours.  No results found.   Echo EF 50-55%, AVA 0.86 cm2  TELEMETRY: Normal sinus rhythm:  ASSESSMENT AND PLAN:  Principal Problem:   Chest pain at rest Active Problems:   Dyspnea    1. Chest pain, pleuritic, negative troponin, nondiagnostic ECG 2. Recurrent pleural effusion, peripheral edema, dyspnea, possible diastolic congestive heart failure, uncertain relationship to aortic stenosis 3. Moderate to severe aortic stenosis 4. Paroxysmal atrial fibrillation  Recommendations  1. Agree with overall current therapy 2. Hold Eliquis 3. Right and left heart cardiac catheterization tentatively scheduled for 07/19/2015   Isaias Cowman, MD, PhD, [facility] 07/18/2015 1:52 PM

## 2015-07-18 NOTE — Progress Notes (Signed)
Patient ID: Caroline Aguilar, female   DOB: April 04, 1926, 82 y.o.   MRN: [id number] [facility] PROGRESS NOTE  Caroline Aguilar [id number] DOB: 1925/06/21 DOA: 07/14/2015 PCP: [name], MD  HPI/Subjective: Feeling better than yesterday. Breathing better. Wants to go home soon.  Objective: Filed Vitals:   07/18/15 0423 07/18/15 1221  BP: 114/59 113/42  Pulse: 71 69  Temp: 97.6 F (36.4 C) 98.3 F (36.8 C)  Resp: 18 20    Filed Weights   07/14/15 2307 07/15/15 0522  Weight: 75.297 kg (166 lb) 74.027 kg (163 lb 3.2 oz)    ROS: Review of Systems  Constitutional: Negative for fever and chills.  Eyes: Negative for blurred vision.  Respiratory: Positive for shortness of breath. Negative for cough.   Cardiovascular: Positive for chest pain.  Gastrointestinal: Negative for nausea, vomiting, abdominal pain, diarrhea and constipation.  Genitourinary: Negative for dysuria.  Musculoskeletal: Negative for joint pain.  Neurological: Negative for dizziness and headaches.   Exam: Physical Exam  Constitutional: She is oriented to person, place, and time.  HENT:  Nose: No mucosal edema.  Mouth/Throat: No oropharyngeal exudate or posterior oropharyngeal edema.  Eyes: Conjunctivae, EOM and lids are normal. Pupils are equal, round, and reactive to light.  Neck: No JVD present. Carotid bruit is not present. No edema present. No thyroid mass and no thyromegaly present.  Cardiovascular: S1 normal and S2 normal.  Exam reveals no gallop.   Murmur heard.  Systolic murmur is present with a grade of 2/6  Pulses:      Dorsalis pedis pulses are 2+ on the right side, and 2+ on the left side.  Respiratory: No respiratory distress. She has decreased breath sounds in the right lower field and the left lower field. She has no wheezes. She has no rhonchi. She has no rales.  GI: Soft. Bowel sounds are normal. There is no tenderness.  Musculoskeletal:       Right ankle: She exhibits no  swelling.       Left ankle: She exhibits no swelling.  Lymphadenopathy:    She has no cervical adenopathy.  Neurological: She is alert and oriented to person, place, and time. No cranial nerve deficit.  Skin: Skin is warm. No rash noted. Nails show no clubbing.  Psychiatric: She has a normal mood and affect.      Data Reviewed: Basic Metabolic Panel:  Recent Labs Lab 07/14/15 2331 07/15/15 0530 07/18/15 0534  NA 132* 133* 134*  K 4.2 3.6 3.8  CL 93* 91* 92*  CO2 31 34* 34*  GLUCOSE 128* 120* 128*  BUN 26* 25* 38*  CREATININE 1.03* 0.98 0.72  CALCIUM 8.3* 8.2* 8.5*  PHOS  --   --  3.9   LCBC:  Recent Labs Lab 07/14/15 2331 07/15/15 0530  WBC 13.1* 13.1*  HGB 12.8 11.8*  HCT 38.1 35.6  MCV 87.5 88.7  PLT 332 325   Cardiac Enzymes:  Recent Labs Lab 07/14/15 2331 07/15/15 0530 07/15/15 1150 07/15/15 1626  TROPONINI <0.03 <0.03 <0.03 <0.03   BNP (last 3 results)  Recent Labs  06/17/15 2230  BNP 244.0*   Scheduled Meds: . amiodarone  400 mg Oral BID  . aspirin EC  81 mg Oral QHS  . calcium-vitamin D  1 tablet Oral Daily  . docusate sodium  100 mg Oral Daily  . feeding supplement (ENSURE ENLIVE)  237 mL Oral BID BM  . furosemide  40 mg Intravenous Q12H  . latanoprost  1 drop Both Eyes QHS  . pantoprazole  40 mg Oral Daily  . polyethylene glycol  17 g Oral Daily  . potassium chloride  30 mEq Oral Daily  . sertraline  50 mg Oral QHS  . simvastatin  20 mg Oral QHS  . sodium chloride flush  3 mL Intravenous Q12H  . sodium chloride  1 g Oral BID WC  . timolol  1 drop Both Eyes BID    Assessment/Plan:  1. Chest pain. Cardiac enzymes are negative. Cardiology wants to do a cardiac catheterization on Monday. 2. Chronic atrial fibrillation. Eliquis on hold for cardiac catheterization. On aspirin and amiodarone 3. Mild pleural effusion. On IV Lasix 4. Hyperlipidemia unspecified continue Zocor 5. History of colon cancer 6. Chronic hyponatremia. Sodium  133 today.   Code Status:     Code Status Orders        Start     Ordered   07/15/15 0439  Full code   Continuous     07/15/15 0439    Code Status History    Date Active Date Inactive Code Status Order ID Comments User Context   07/02/2015  7:03 PM 07/09/2015  6:12 PM Full Code [id number]  Lytle Butte, MD ED   06/19/2015  3:15 AM 06/20/2015  4:31 PM Full Code [id number]  Saundra Shelling, MD Inpatient    Advance Directive Documentation        Most Recent Value   Type of Advance Directive  Living will, Healthcare Power of Attorney   Pre-existing out of facility DNR order (yellow form or pink MOST form)     "MOST" Form in Place?       Disposition Plan: Potentially home Monday afternoon  Consultants:  Cardiology  Nephrology  Time spent: 20 minutes  [place], [place] Hospitalists

## 2015-07-19 ENCOUNTER — Encounter: Payer: Self-pay | Admitting: Cardiology

## 2015-07-19 ENCOUNTER — Encounter
Admission: EM | Disposition: A | Discharge status: Home Health | Payer: Self-pay | Source: Home / Self Care | Attending: Internal Medicine

## 2015-07-19 HISTORY — PX: CARDIAC CATHETERIZATION: SHX172

## 2015-07-19 SURGERY — RIGHT AND LEFT HEART CATH
Anesthesia: Moderate Sedation

## 2015-07-19 MED ORDER — HEPARIN (PORCINE) IN NACL 2-0.9 UNIT/ML-% IJ SOLN
INTRAMUSCULAR | Status: AC
  Filled 2015-07-19: qty 500

## 2015-07-19 MED ORDER — APIXABAN 5 MG PO TABS: 5.0000 mg | ORAL_TABLET | Freq: Two times a day (BID) | ORAL | Status: DC

## 2015-07-19 MED ORDER — SODIUM CHLORIDE 0.9 % IV SOLN: 250.0000 mL | INTRAVENOUS | Status: DC | PRN

## 2015-07-19 MED ORDER — FUROSEMIDE 40 MG PO TABS
40.0000 mg | ORAL_TABLET | Freq: Two times a day (BID) | ORAL | Status: DC
  Administered 2015-07-19 – 2015-07-20 (×2): 40 mg via ORAL
  Filled 2015-07-19 (×2): qty 1

## 2015-07-19 MED ORDER — MIDAZOLAM HCL 2 MG/2ML IJ SOLN
INTRAMUSCULAR | Status: DC | PRN
  Administered 2015-07-19: 0.5 mg via INTRAVENOUS

## 2015-07-19 MED ORDER — APIXABAN 5 MG PO TABS
5.0000 mg | ORAL_TABLET | Freq: Two times a day (BID) | ORAL | Status: DC
  Administered 2015-07-19 – 2015-07-20 (×2): 5 mg via ORAL
  Filled 2015-07-19 (×2): qty 1

## 2015-07-19 MED ORDER — ASPIRIN 81 MG PO CHEW
81.0000 mg | CHEWABLE_TABLET | ORAL | Status: AC
  Administered 2015-07-19: 81 mg via ORAL
  Filled 2015-07-19: qty 1

## 2015-07-19 MED ORDER — SODIUM CHLORIDE 0.9 % WEIGHT BASED INFUSION
3.0000 mL/kg/h | INTRAVENOUS | Status: DC
  Administered 2015-07-19: 3 mL/kg/h via INTRAVENOUS

## 2015-07-19 MED ORDER — SODIUM CHLORIDE 0.9% FLUSH
3.0000 mL | Freq: Two times a day (BID) | INTRAVENOUS | Status: DC
  Administered 2015-07-19: 3 mL via INTRAVENOUS

## 2015-07-19 MED ORDER — SODIUM CHLORIDE 0.9 % WEIGHT BASED INFUSION
1.0000 mL/kg/h | INTRAVENOUS | Status: DC
  Administered 2015-07-19: 1 mL/kg/h via INTRAVENOUS

## 2015-07-19 MED ORDER — FENTANYL CITRATE (PF) 100 MCG/2ML IJ SOLN
INTRAMUSCULAR | Status: AC
  Filled 2015-07-19: qty 2

## 2015-07-19 MED ORDER — MIDAZOLAM HCL 2 MG/2ML IJ SOLN
INTRAMUSCULAR | Status: AC
  Filled 2015-07-19: qty 2

## 2015-07-19 MED ORDER — ASPIRIN 81 MG PO CHEW
81.0000 mg | CHEWABLE_TABLET | ORAL | Status: DC
Start: 2015-07-20 — End: 2015-07-19

## 2015-07-19 MED ORDER — SODIUM CHLORIDE 0.9% FLUSH: 3.0000 mL | INTRAVENOUS | Status: DC | PRN

## 2015-07-19 MED ORDER — IOPAMIDOL (ISOVUE-300) INJECTION 61%
INTRAVENOUS | Status: DC | PRN
  Administered 2015-07-19: 90 mL via INTRA_ARTERIAL

## 2015-07-19 SURGICAL SUPPLY — 18 items
CATH INFINITI 5FR ANG PIGTAIL (CATHETERS) ×3 IMPLANT
CATH INFINITI 5FR JL4 (CATHETERS) ×3 IMPLANT
CATH INFINITI 5FR JL5 (CATHETERS) ×3 IMPLANT
CATH INFINITI JR4 5F (CATHETERS) ×3 IMPLANT
CATH LANGSTON DUAL LUM PIG 6FR (CATHETERS) ×3 IMPLANT
CATH SWANZ 7F THERMO (CATHETERS) ×3 IMPLANT
DEVICE CLOSURE MYNXGRIP 6/7F (Vascular Products) ×3 IMPLANT
GUIDEWIRE EMER 3M J .025X150CM (WIRE) ×3 IMPLANT
KIT MANI 3VAL PERCEP (MISCELLANEOUS) ×3 IMPLANT
KIT RIGHT HEART (MISCELLANEOUS) ×3 IMPLANT
NEEDLE PERC 18GX7CM (NEEDLE) ×3 IMPLANT
PACK CARDIAC CATH (CUSTOM PROCEDURE TRAY) ×3 IMPLANT
SHEATH AVANTI 5FR X 11CM (SHEATH) ×3 IMPLANT
SHEATH AVANTI 6FR X 11CM (SHEATH) ×3 IMPLANT
SHEATH PINNACLE 7F 10CM (SHEATH) ×6 IMPLANT
WIRE EMERALD 3MM-J .035X150CM (WIRE) ×3 IMPLANT
WIRE EMERALD 3MM-J .035X260CM (WIRE) ×3 IMPLANT
WIRE EMERALD ST .035X150CM (WIRE) ×3 IMPLANT

## 2015-07-19 NOTE — Care Management Important Message (Signed)
Important Message  Patient Details  Name: Caroline Aguilar MRN: [id number] Date of Birth: 18-Jul-1925   Medicare Important Message Given:  Yes    [name] 07/19/2015, 1:47 PM

## 2015-07-19 NOTE — Care Management (Signed)
No family in room. TC to son Ridhima Demerath, left message for return call.

## 2015-07-19 NOTE — Progress Notes (Signed)
Pt returned from cath lab s/p heart cath.  Dressing to rt groin dry and intact.  Pulses equal, no hematoma or bleeding noted.  SL to lt hand/lt fa.  No distress on ra.  Cadiac montior in place, pt denies chest pain.  CB in reach, SR up x 2, bed alarm on.

## 2015-07-19 NOTE — Progress Notes (Signed)
Patient ID: Caroline Aguilar, female   DOB: August 05, 1925, 82 y.o.   MRN: [id number] [facility] PROGRESS NOTE  Caroline Aguilar [id number] DOB: 1926-01-24 DOA: 07/14/2015 PCP: [name], MD  HPI/Subjective: Patient seen after cardiac catheterization. Feeling better and breathing better.  Objective: Filed Vitals:   07/19/15 0945 07/19/15 1114  BP: 120/46 125/42  Pulse: 59 60  Temp:  97.7 F (36.5 C)  Resp: 25 16    Filed Weights   07/14/15 2307 07/15/15 0522 07/19/15 0335  Weight: 75.297 kg (166 lb) 74.027 kg (163 lb 3.2 oz) 70.988 kg (156 lb 8 oz)    ROS: Review of Systems  Constitutional: Negative for fever and chills.  Eyes: Negative for blurred vision.  Respiratory: Positive for shortness of breath. Negative for cough.   Cardiovascular: Positive for chest pain.  Gastrointestinal: Negative for nausea, vomiting, abdominal pain, diarrhea and constipation.  Genitourinary: Negative for dysuria.  Musculoskeletal: Negative for joint pain.  Neurological: Negative for dizziness and headaches.   Exam: Physical Exam  Constitutional: She is oriented to person, place, and time.  HENT:  Nose: No mucosal edema.  Mouth/Throat: No oropharyngeal exudate or posterior oropharyngeal edema.  Eyes: Conjunctivae, EOM and lids are normal. Pupils are equal, round, and reactive to light.  Neck: No JVD present. Carotid bruit is not present. No edema present. No thyroid mass and no thyromegaly present.  Cardiovascular: S1 normal and S2 normal.  Exam reveals no gallop.   Murmur heard.  Systolic murmur is present with a grade of 3/6  Pulses:      Dorsalis pedis pulses are 2+ on the right side, and 2+ on the left side.  Respiratory: No respiratory distress. She has decreased breath sounds in the right lower field and the left lower field. She has no wheezes. She has no rhonchi. She has no rales.  GI: Soft. Bowel sounds are normal. There is no tenderness.  Musculoskeletal:   Right ankle: She exhibits no swelling.       Left ankle: She exhibits no swelling.  Lymphadenopathy:    She has no cervical adenopathy.  Neurological: She is alert and oriented to person, place, and time. No cranial nerve deficit.  Skin: Skin is warm. No rash noted. Nails show no clubbing.  Psychiatric: She has a normal mood and affect.      Data Reviewed: Basic Metabolic Panel:  Recent Labs Lab 07/14/15 2331 07/15/15 0530 07/18/15 0534  NA 132* 133* 134*  K 4.2 3.6 3.8  CL 93* 91* 92*  CO2 31 34* 34*  GLUCOSE 128* 120* 128*  BUN 26* 25* 38*  CREATININE 1.03* 0.98 0.72  CALCIUM 8.3* 8.2* 8.5*  PHOS  --   --  3.9   LCBC:  Recent Labs Lab 07/14/15 2331 07/15/15 0530  WBC 13.1* 13.1*  HGB 12.8 11.8*  HCT 38.1 35.6  MCV 87.5 88.7  PLT 332 325   Cardiac Enzymes:  Recent Labs Lab 07/14/15 2331 07/15/15 0530 07/15/15 1150 07/15/15 1626  TROPONINI <0.03 <0.03 <0.03 <0.03   BNP (last 3 results)  Recent Labs  06/17/15 2230  BNP 244.0*   Scheduled Meds: . amiodarone  400 mg Oral BID  . aspirin EC  81 mg Oral QHS  . calcium-vitamin D  1 tablet Oral Daily  . docusate sodium  100 mg Oral Daily  . feeding supplement (ENSURE ENLIVE)  237 mL Oral BID BM  . furosemide  40 mg Oral BID  . latanoprost  1  drop Both Eyes QHS  . pantoprazole  40 mg Oral Daily  . polyethylene glycol  17 g Oral Daily  . potassium chloride  30 mEq Oral Daily  . sertraline  50 mg Oral QHS  . simvastatin  20 mg Oral QHS  . sodium chloride flush  3 mL Intravenous Q12H  . sodium chloride  1 g Oral BID WC  . timolol  1 drop Both Eyes BID    Assessment/Plan:  1. Severe aortic stenosis. Cardiology recommended in [facility]. This will be referred as outpatient as per cardiology.  2. Acute on chronic diastolic Congestive heart failure and pleural effusions with severe aortic stenosis. Switch IV Lasix over to oral Lasix. Will need to be on oral Lasix as outpatient. Severe aortic stenosis and heart  failure is a poor prognosis. 3. Chest pain. Cardiac enzymes are negative. Cardiac catheter showed minimal disease. 4. Chronic atrial fibrillation. Restart Eliquis tonight. On aspirin and amiodarone 5. Hyperlipidemia unspecified continue Zocor 6. History of colon cancer 7. Chronic hyponatremia. Sodium 134 today.   Code Status:     Code Status Orders        Start     Ordered   07/15/15 0439  Full code   Continuous     07/15/15 0439    Code Status History    Date Active Date Inactive Code Status Order ID Comments User Context   07/02/2015  7:03 PM 07/09/2015  6:12 PM Full Code [id number]  Lytle Butte, MD ED   06/19/2015  3:15 AM 06/20/2015  4:31 PM Full Code [id number]  Saundra Shelling, MD Inpatient    Advance Directive Documentation        Most Recent Value   Type of Advance Directive  Living will, Healthcare Power of Attorney   Pre-existing out of facility DNR order (yellow form or pink MOST form)     "MOST" Form in Place?       Disposition Plan: Home tomorrow  Consultants:  Cardiology  Nephrology  Time spent: 20 minutes  [name]  [facility]

## 2015-07-19 NOTE — Plan of Care (Signed)
Problem: Physical Regulation: Goal: Ability to maintain clinical measurements within normal limits will improve Outcome: Progressing R femoral cath site WNL, no hematoma, bleeding or pain.   Problem: Activity: Goal: Risk for activity intolerance will decrease Outcome: Progressing Up with PT today and walked around nurses station with walker successfully w/o complaints of SOB, dizziness or lightheadedness.

## 2015-07-19 NOTE — Progress Notes (Signed)
Report to tanya telemetry.  Check right groin for bleeding or hematoma.  Patient will be on bedrest for 1 hours post sheath pull---out of bed at 10:00.  Bilateral pulses are weak PT's.Marland Kitchen

## 2015-07-19 NOTE — Evaluation (Signed)
Physical Therapy Evaluation Patient Details Name: Caroline Aguilar MRN: [id number] DOB: July 24, 1925 Today's Date: 07/19/2015   History of Present Illness  Caroline Aguilar is a 82 y.o. female with a known history of Hyperlipidemia, atrial fibrillation, colon cancer, hyponatremia, pleural effusion, aortic stenosis presented to the emergency room with chest pain and increased shortness of breath as stated afternoon. Chest pain is located retrosternally and sharp in nature. Pain is 8 out of 10 on a scale of 1-10 and radiates to right shoulder. Patient is not on home oxygen. She was recently had thoracentesis week ago and 600 mL of pleural fluid was drained. She again presented to the emergency room today with difficulty breathing and was put on oxygen via nasal cannula. First set of troponin was negative. Chest x-ray revealed pleural effusions. Echocardiogram during the last admission workup showed aortic stenosis. Patient saw her cardiologist in the clinic yesterday in the morning and her recommendations were discussed regarding catheter ablation for atrial fibrillation in the next couple of weeks. No history of fever or chills. No history of cough. Pt underwent cardiac catheterization this AM. Cardiology recommending TaVR. Pt reports one fall in the last 12 months  Clinical Impression  Pt is slow and steady with her ambulation but does not present with safety concerns. She reports that overall mobility is slightly below baseline but is more than adequate to be home safely. Recommended HH PT however pt refuses at this time. Encouraged use of rolling walker at all times while at home. Pt will benefit from skilled PT services to address deficits in strength, balance, and mobility in order to return to full function at home.     Follow Up Recommendations Home health PT;Other (comment) (Currently refuses HH PT)    Equipment Recommendations  None recommended by PT    Recommendations for Other Services        Precautions / Restrictions Precautions Precautions: Fall Restrictions Weight Bearing Restrictions: No      Mobility  Bed Mobility Overal bed mobility: Needs Assistance Bed Mobility: Supine to Sit;Sit to Supine     Supine to sit: Min assist Sit to supine: Min assist   General bed mobility comments: Pt requires minA+1 for lifting and transferring legs. Use of bed rails and HOB elevated  Transfers Overall transfer level: Needs assistance Equipment used: Rolling walker (2 wheeled) Transfers: Sit to/from Stand Sit to Stand: Min guard         General transfer comment: Pt demonstrates safe hand placement. Steady without LOB  Ambulation/Gait Ambulation/Gait assistance: Min guard Ambulation Distance (Feet): 220 Feet Assistive device: Rolling walker (2 wheeled) Gait Pattern/deviations: Decreased step length - right;Decreased step length - left Gait velocity: Decreased but functional for full household mobility Gait velocity interpretation: <1.8 ft/sec, indicative of risk for recurrent falls General Gait Details: Pt demonstrates slow and steady cadence. She is able to perform gait speed changes with cues. Pt with mild slowling of gait with horizontal and vertical head turns but no overt LOB. Vitals continuously monitored and pt denies DOE during ambulation. SaO2 remains >90% on room air and HR remains close to 85 bpm. Pt reports fatigue near the end of ambulation. Standing rest breaks provided intermittently to assess vitals  Stairs            Wheelchair Mobility    Modified Rankin (Stroke Patients Only)       Balance Overall balance assessment: Needs assistance Sitting-balance support: No upper extremity supported Sitting balance-Leahy Scale: Fair  Standing balance support: No upper extremity supported Standing balance-Leahy Scale: Fair Standing balance comment: Pt able to maintain wide stance. Require assist to obtain narrow stance. Positive Rhomberg for LOB                              Pertinent Vitals/Pain Pain Assessment: No/denies pain    Home Living Family/patient expects to be discharged to:: Private residence Living Arrangements: Alone Available Help at Discharge: Family Type of Home: House Home Access: Stairs to enter Entrance Stairs-Rails: Right Entrance Stairs-Number of Steps: 4 Home Layout: One level Home Equipment: Environmental consultant - 2 wheels;Cane - single point;Bedside commode;Tub bench (Lift chair)      Prior Function Level of Independence: Needs assistance   Gait / Transfers Assistance Needed: Independent with use of rolling walker for household mobility     Comments: Independent with ADLs. Family assists with IADLs such as meal preparation, grocery shopping, and cleaning home     Hand Dominance   Dominant Hand: Right    Extremity/Trunk Assessment   Upper Extremity Assessment: Generalized weakness           Lower Extremity Assessment: Generalized weakness (No focal weakness)         Communication   Communication: No difficulties  Cognition Arousal/Alertness: Awake/alert Behavior During Therapy: WFL for tasks assessed/performed Overall Cognitive Status: Within Functional Limits for tasks assessed                      General Comments      Exercises        Assessment/Plan    PT Assessment Patient needs continued PT services  PT Diagnosis Difficulty walking;Generalized weakness   PT Problem List Decreased strength;Decreased activity tolerance;Decreased balance;Decreased mobility;Cardiopulmonary status limiting activity  PT Treatment Interventions Gait training;Stair training;Therapeutic activities;Therapeutic exercise;Balance training;Neuromuscular re-education;Patient/family education   PT Goals (Current goals can be found in the Care Plan section) Acute Rehab PT Goals Patient Stated Goal: Pt would like to get stronger and return to prior level of function PT Goal Formulation: With  patient/family Time For Goal Achievement: 08/02/15 Potential to Achieve Goals: Fair    Frequency Min 2X/week   Barriers to discharge Inaccessible home environment Steps to enter    Co-evaluation               End of Session Equipment Utilized During Treatment: Gait belt Activity Tolerance: Patient tolerated treatment well;Patient limited by fatigue Patient left: in bed;with call bell/phone within reach;with bed alarm set;with family/visitor present Nurse Communication: Mobility status         Time: [id number] PT Time Calculation (min) (ACUTE ONLY): 25 min   Charges:   PT Evaluation $PT Eval Moderate Complexity: 1 Procedure PT Treatments $Gait Training: 8-22 mins   PT G Codes:       Lyndel Safe Oluwaferanmi Wain PT, DPT   Nikaya Nasby 07/19/2015, 5:24 PM

## 2015-07-19 NOTE — Care Management Note (Signed)
Case Management Note  Patient Details  Name: Caroline Aguilar MRN: [id number] Date of Birth: 08-23-1925  Subjective/Objective:   Return call from Son, He is agreeable to [facility] coming out again for SN/PT. PT consult pending. Will follow up.              Action/Plan:   Expected Discharge Date:   07/21/2015               Expected Discharge Plan:  [place]  In-House Referral:     Discharge planning Services  CM Consult  Post Acute Care Choice:  Home Health Choice offered to:  Adult Children  DME Arranged:    DME Agency:     HH Arranged:  RN, PT [place] Agency:  [place]  Status of Service:  In process, will continue to follow  Medicare Important Message Given:  Yes Date Medicare IM Given:    Medicare IM give by:    Date Additional Medicare IM Given:    Additional Medicare Important Message give by:     If discussed at [place] of Stay Meetings, dates discussed:    Additional Comments:  Jolly Mango, RN 07/19/2015, 2:42 PM

## 2015-07-19 NOTE — Progress Notes (Signed)
To cath lab via bed.

## 2015-07-20 LAB — MAGNESIUM: MAGNESIUM: 2.2 mg/dL (ref 1.7–2.4)

## 2015-07-20 LAB — BASIC METABOLIC PANEL
ANION GAP: 10 (ref 5–15)
BUN: 38 mg/dL — ABNORMAL HIGH (ref 6–20)
CO2: 35 mmol/L — ABNORMAL HIGH (ref 22–32)
Calcium: 8.7 mg/dL — ABNORMAL LOW (ref 8.9–10.3)
Chloride: 91 mmol/L — ABNORMAL LOW (ref 101–111)
Creatinine, Ser: 0.9 mg/dL (ref 0.44–1.00)
GFR, EST NON AFRICAN AMERICAN: 56 mL/min — AB (ref >60)
GLUCOSE: 115 mg/dL — AB (ref 65–99)
POTASSIUM: 3.3 mmol/L — AB (ref 3.5–5.1)
Sodium: 136 mmol/L (ref 135–145)

## 2015-07-20 LAB — CBC
HCT: 35.4 % (ref 35.0–47.0)
Hemoglobin: 12.1 g/dL (ref 12.0–16.0)
MCH: 30 pg (ref 26.0–34.0)
MCHC: 34 g/dL (ref 32.0–36.0)
MCV: 88.3 fL (ref 80.0–100.0)
PLATELETS: 337 10*3/uL (ref 150–440)
RBC: 4.01 MIL/uL (ref 3.80–5.20)
RDW: 13.4 % (ref 11.5–14.5)
WBC: 10.1 10*3/uL (ref 3.6–11.0)

## 2015-07-20 MED ORDER — FUROSEMIDE 40 MG PO TABS
40.0000 mg | ORAL_TABLET | Freq: Two times a day (BID) | ORAL | Status: DC
Start: 2015-07-20 — End: 2016-09-05

## 2015-07-20 MED ORDER — AMIODARONE HCL 200 MG PO TABS
200.0000 mg | ORAL_TABLET | Freq: Two times a day (BID) | ORAL | Status: DC
  Administered 2015-07-20: 200 mg via ORAL
  Filled 2015-07-20: qty 1

## 2015-07-20 MED ORDER — POTASSIUM CHLORIDE 20 MEQ PO PACK: 20.0000 meq | PACK | Freq: Two times a day (BID) | ORAL | Status: DC

## 2015-07-20 MED ORDER — AMIODARONE HCL 200 MG PO TABS: 200.0000 mg | ORAL_TABLET | Freq: Every day | ORAL | Status: DC

## 2015-07-20 MED ORDER — POTASSIUM CHLORIDE CRYS ER 20 MEQ PO TBCR
40.0000 meq | EXTENDED_RELEASE_TABLET | Freq: Once | ORAL | Status: AC
  Administered 2015-07-20: 40 meq via ORAL
  Filled 2015-07-20: qty 2

## 2015-07-20 NOTE — Progress Notes (Signed)
[facility]  SUBJECTIVE: I feel better   Filed Vitals:   07/19/15 0945 07/19/15 1114 07/19/15 1939 07/20/15 0520  BP: 120/46 125/42 119/52 116/63  Pulse: 59 60 70 71  Temp:  97.7 F (36.5 C) 98.2 F (36.8 C) 97.7 F (36.5 C)  TempSrc:  Oral Oral Oral  Resp: 25 16    Height:      Weight:      SpO2: 94% 97% 93% 94%     Intake/Output Summary (Last 24 hours) at 07/20/15 [id number] Last data filed at 07/20/15 0525  Gross per 24 hour  Intake    123 ml  Output    550 ml  Net   -427 ml      PHYSICAL EXAM  General: Well developed, well nourished, in no acute distress HEENT:  Normocephalic and atramatic Neck:  No JVD.  Lungs: Clear bilaterally to auscultation and percussion. Heart: HRRR . Grade 3/6 mid peaking systolic murmur Abdomen: Bowel sounds are positive, abdomen soft and non-tender  Msk:  Back normal, normal gait. Normal strength and tone for age. Extremities: No clubbing, cyanosis or edema.   Neuro: Alert and oriented X 3. Psych:  Good affect, responds appropriately   LABS: Basic Metabolic Panel:  Recent Labs  07/18/15 0534 07/20/15 0438  NA 134* 136  K 3.8 3.3*  CL 92* 91*  CO2 34* 35*  GLUCOSE 128* 115*  BUN 38* 38*  CREATININE 0.72 0.90  CALCIUM 8.5* 8.7*  MG  --  2.2  PHOS 3.9  --    Liver Function Tests:  Recent Labs  07/18/15 0534  ALBUMIN 2.8*   No results for input(s): LIPASE, AMYLASE in the last 72 hours. CBC: No results for input(s): WBC, NEUTROABS, HGB, HCT, MCV, PLT in the last 72 hours. Cardiac Enzymes: No results for input(s): CKTOTAL, CKMB, CKMBINDEX, TROPONINI in the last 72 hours. BNP: Invalid input(s): POCBNP D-Dimer: No results for input(s): DDIMER in the last 72 hours. Hemoglobin A1C: No results for input(s): HGBA1C in the last 72 hours. Fasting Lipid Panel: No results for input(s): CHOL, HDL, LDLCALC, TRIG, CHOLHDL, LDLDIRECT in the last 72 hours. Thyroid Function Tests: No results for input(s): TSH, T4TOTAL, T3FREE,  THYROIDAB in the last 72 hours.  Invalid input(s): FREET3 Anemia Panel: No results for input(s): VITAMINB12, FOLATE, FERRITIN, TIBC, IRON, RETICCTPCT in the last 72 hours.  No results found.   Echo normal left ventricular function with LV ejection fraction 50-55% with severe aortic stenosis, calculated aortic valve area 0.86 cm  TELEMETRY: Normal sinus rhythm:  ASSESSMENT AND PLAN:  Principal Problem:   Chest pain at rest Active Problems:   Dyspnea    1. Severe aortic stenosis, calculated aortic valve area 0.86 cm by echocardiogram, unable to cross the aortic valve after multiple attempts during cardiac catheterization, symptomatic, with acute diastolic congestive heart failure 2. Insignificant coronary artery disease by cardiac catheterization 3. Paroxysmal atrial fibrillation, on Eliquis for stroke prevention, and amiodarone for rhythm control 4. Episodic Mobitz 1 second-degree AV block  Recommendations  1. Continue current therapy 2. Referral to [facility] as outpatient for TAVR 3. Continue Eliquis for stroke prevention 4. Decrease amiodarone to 200 mg twice a day   July Linam, MD, PhD, [facility] Ed 07/20/2015 8:11 AM

## 2015-07-20 NOTE — Care Management (Signed)
Cardiac cath revealed severe aortic stenosis.  Cardiology is going to pursue valve replacement as outpatient. Llasix was transitioned to oral 4/3.  Referral for SN and PT made to [facility] 4/3.  Anticipate discharge home today. Notified [facility].

## 2015-07-20 NOTE — Discharge Summary (Signed)
[facility] [place] NAME: Caroline Aguilar    MR#:  [id number]  DATE OF BIRTH:  [birth year]  DATE OF ADMISSION:  07/14/2015 ADMITTING PHYSICIAN: Saundra Shelling, MD  DATE OF DISCHARGE: 07/20/2015 12:59 PM  PRIMARY CARE PHYSICIAN: [name], MD    ADMISSION DIAGNOSIS:  Pleural effusion [J90] Persistent atrial fibrillation (HCC) [I48.1] Chest pain, unspecified chest pain type [R07.9] Acute on chronic congestive heart failure, unspecified congestive heart failure type ([place]) [I50.9]  DISCHARGE DIAGNOSIS:  Principal Problem:   Chest pain at rest Active Problems:   Dyspnea   SECONDARY DIAGNOSIS:   Past Medical History  Diagnosis Date  . Angina pectoris ([place])   . Hyperlipidemia   . Cancer [facility])     Colon cancer  . A-fib [facility])     HOSPITAL COURSE:   1. Chest pain. Patient was kept in the hospital and her eliquis was held for cardiac catheterization on Monday, 07/19/2015. Cardiac catheter showed minimal blockages but severe aortic stenosis. 2. Severe aortic stenosis, diastolic congestive heart failure. These 2 factors have a poor prognosis. Dr. Lorinda Creed recommended outpatient follow-up at [facility] for TAVR. Will need close clinical follow-up with Dr. Clayborn Bigness as outpatient. Continue Lasix 40 mg orally twice a day to control fluid. Blood pressure too low for beta blocker at this time. 3. Atrial fibrillation. Patient on amiodarone for rate control. Amiodarone dose decreased to 200 mg per day as per cardiology. Eliquis restarted for anticoagulation. 4. Hyperlipidemia unspecified continue simvastatin 5. Gastroesophageal reflux disease without esophagitis on PPI 6. Glaucoma unspecified continue eyedrops 7. Chronic hyponatremia- on salt tabs  DISCHARGE CONDITIONS:   Fair  CONSULTS OBTAINED:  Treatment Team:  Dustin Flock, MD Isaias Cowman, MD Lavonia Dana, MD  DRUG ALLERGIES:   Allergies  Allergen Reactions  . Penicillins  Anaphylaxis and Other (See Comments)    Has patient had a PCN reaction causing immediate rash, facial/tongue/throat swelling, SOB or lightheadedness with hypotension: Yes Has patient had a PCN reaction causing severe rash involving mucus membranes or skin necrosis: No Has patient had a PCN reaction that required hospitalization No Has patient had a PCN reaction occurring within the last 10 years: No If all of the above answers are "NO", then may proceed with Cephalosporin use.  . Azithromycin Other (See Comments)    Reaction:  Unknown   . Famciclovir Other (See Comments)    Reaction:  Unknown     DISCHARGE MEDICATIONS:   Discharge Medication List as of 07/20/2015 11:40 AM    START taking these medications   Details  furosemide (LASIX) 40 MG tablet Take 1 tablet (40 mg total) by mouth 2 (two) times daily., Starting 07/20/2015, Until Discontinued, Print      CONTINUE these medications which have CHANGED   Details  amiodarone (PACERONE) 200 MG tablet Take 1 tablet (200 mg total) by mouth daily., Starting 07/20/2015, Until Discontinued, Print    potassium chloride (KLOR-CON) 20 MEQ packet Take 20 mEq by mouth 2 (two) times daily., Starting 07/20/2015, Until Discontinued, Print      CONTINUE these medications which have NOT CHANGED   Details  apixaban (ELIQUIS) 2.5 MG TABS tablet Take 1 tablet (2.5 mg total) by mouth 2 (two) times daily., Starting 07/09/2015, Until Discontinued, Normal    aspirin EC 81 MG tablet Take 81 mg by mouth at bedtime., Until Discontinued, Historical Med    Calcium Carbonate-Vitamin D (CALCIUM 600+D) 600-200 MG-UNIT TABS Take 1 tablet by mouth daily., Until Discontinued,  Historical Med    docusate sodium (COLACE) 100 MG capsule Take 100 mg by mouth daily., Until Discontinued, Historical Med    feeding supplement, ENSURE ENLIVE, (ENSURE ENLIVE) LIQD Take 237 mLs by mouth 2 (two) times daily between meals., Starting 07/09/2015, Until Discontinued, Normal    latanoprost  (XALATAN) 0.005 % ophthalmic solution Place 1 drop into both eyes at bedtime., Until Discontinued, Historical Med    pantoprazole (PROTONIX) 40 MG tablet Take 40 mg by mouth daily., Until Discontinued, Historical Med    sertraline (ZOLOFT) 50 MG tablet Take 50 mg by mouth at bedtime. Reported on 07/04/2015, Until Discontinued, Historical Med    simvastatin (ZOCOR) 20 MG tablet Take 20 mg by mouth at bedtime., Until Discontinued, Historical Med    sodium chloride 1 g tablet Take 1 tablet (1 g total) by mouth 2 (two) times daily with a meal., Starting 07/09/2015, Until Discontinued, Normal    timolol (TIMOPTIC) 0.5 % ophthalmic solution Place 1 drop into both eyes 2 (two) times daily., Until Discontinued, Historical Med      STOP taking these medications     cephALEXin (KEFLEX) 500 MG capsule      metoprolol succinate (TOPROL-XL) 25 MG 24 hr tablet      triamterene-hydrochlorothiazide (DYAZIDE) 37.5-25 MG capsule      Calcium 600-200 MG-UNIT tablet          DISCHARGE INSTRUCTIONS:   Follow-up Dr. Clayborn Bigness for referral over to [facility] for TaVR  If you experience worsening of your admission symptoms, develop shortness of breath, life threatening emergency, suicidal or homicidal thoughts you must seek medical attention immediately by calling 911 or calling your MD immediately  if symptoms less severe.  You Must read complete instructions/literature along with all the possible adverse reactions/side effects for all the Medicines you take and that have been prescribed to you. Take any new Medicines after you have completely understood and accept all the possible adverse reactions/side effects.   Please note  You were cared for by a hospitalist during your hospital stay. If you have any questions about your discharge medications or the care you received while you were in the hospital after you are discharged, you can call the unit and asked to speak with the hospitalist on call if the  hospitalist that took care of you is not available. Once you are discharged, your primary care physician will handle any further medical issues. Please note that NO REFILLS for any discharge medications will be authorized once you are discharged, as it is imperative that you return to your primary care physician (or establish a relationship with a primary care physician if you do not have one) for your aftercare needs so that they can reassess your need for medications and monitor your lab values.    Today   CHIEF COMPLAINT:   Chief Complaint  Patient presents with  . Chest Pain  . Atrial Fibrillation    HISTORY OF PRESENT ILLNESS:  Caroline Aguilar  is a 82 y.o. female presented with chest pain.   VITAL SIGNS:  Blood pressure 107/51, pulse 64, temperature 97.7 F (36.5 C), temperature source Oral, resp. rate 16, height 5\' 5"  (1.651 m), weight 70.988 kg (156 lb 8 oz), SpO2 96 %.    PHYSICAL EXAMINATION:  GENERAL:  82 y.o.-year-old patient lying in the bed with no acute distress.  EYES: Pupils equal, round, reactive to light and accommodation. No scleral icterus. Extraocular muscles intact.  HEENT: Head atraumatic, normocephalic. Oropharynx and nasopharynx clear.  NECK:  Supple, no jugular venous distention. No thyroid enlargement, no tenderness.  LUNGS: Decreased breath sounds bilaterally bases, no wheezing, rales,rhonchi or crepitation. No use of accessory muscles of respiration.  CARDIOVASCULAR: S1, S2 normal. No murmurs, rubs, or gallops.  ABDOMEN: Soft, non-tender, non-distended. Bowel sounds present. No organomegaly or mass.  EXTREMITIES: No pedal edema, cyanosis, or clubbing.  NEUROLOGIC: Cranial nerves II through XII are intact. Muscle strength 5/5 in all extremities. Sensation intact. Gait not checked.  PSYCHIATRIC: [name].  SKIN: No obvious rash, lesion, or ulcer.   DATA REVIEW:   CBC  Recent Labs Lab 07/20/15 0438  WBC 10.1  HGB  12.1  HCT 35.4  PLT 337    Chemistries   Recent Labs Lab 07/20/15 0438  NA 136  K 3.3*  CL 91*  CO2 35*  GLUCOSE 115*  BUN 38*  CREATININE 0.90  CALCIUM 8.7*  MG 2.2    Cardiac Enzymes  Recent Labs Lab 07/15/15 1626  TROPONINI <0.03    Management plans discussed with the patient, family and they are in agreement.  CODE STATUS:  Code Status History    Date Active Date Inactive Code Status Order ID Comments User Context   07/15/2015  4:39 AM 07/20/2015  4:00 PM Full Code [id number]  Saundra Shelling, MD ED   07/02/2015  7:03 PM 07/09/2015  6:12 PM Full Code [id number]  Lytle Butte, MD ED   06/19/2015  3:15 AM 06/20/2015  4:31 PM Full Code [id number]  Saundra Shelling, MD Inpatient    Advance Directive Documentation        Most Recent Value   Type of Advance Directive  Living will   Pre-existing out of facility DNR order (yellow form or pink MOST form)     "MOST" Form in Place?        TOTAL TIME TAKING CARE OF THIS PATIENT: 35 minutes.    [name] M.D on 07/20/2015 at 4:39 PM  Between 7am to 6pm - Pager - [phone]  After 6pm go to [email or web address] - password [email or web address]  [facility] [employer] Office  [phone]  CC: Primary care physician; [name], MD

## 2015-07-20 NOTE — Discharge Instructions (Signed)
Rec follow up at [facility] for TAVR

## 2015-07-21 ENCOUNTER — Encounter: Payer: Self-pay | Admitting: *Deleted

## 2015-07-21 ENCOUNTER — Other Ambulatory Visit: Payer: Self-pay | Admitting: *Deleted

## 2015-07-21 DIAGNOSIS — I350 Nonrheumatic aortic (valve) stenosis: Secondary | ICD-10-CM | POA: Diagnosis not present

## 2015-07-21 DIAGNOSIS — Z7982 Long term (current) use of aspirin: Secondary | ICD-10-CM | POA: Diagnosis not present

## 2015-07-21 DIAGNOSIS — Z742 Need for assistance at home and no other household member able to render care: Secondary | ICD-10-CM | POA: Diagnosis not present

## 2015-07-21 DIAGNOSIS — I482 Chronic atrial fibrillation: Secondary | ICD-10-CM | POA: Diagnosis not present

## 2015-07-21 DIAGNOSIS — Z85038 Personal history of other malignant neoplasm of large intestine: Secondary | ICD-10-CM | POA: Diagnosis not present

## 2015-07-21 DIAGNOSIS — E871 Hypo-osmolality and hyponatremia: Secondary | ICD-10-CM | POA: Diagnosis not present

## 2015-07-21 DIAGNOSIS — E875 Hyperkalemia: Secondary | ICD-10-CM | POA: Diagnosis not present

## 2015-07-21 DIAGNOSIS — Z7901 Long term (current) use of anticoagulants: Secondary | ICD-10-CM | POA: Diagnosis not present

## 2015-07-21 DIAGNOSIS — I5033 Acute on chronic diastolic (congestive) heart failure: Secondary | ICD-10-CM | POA: Diagnosis not present

## 2015-07-21 NOTE — Patient Outreach (Signed)
Transition of care call (week 1, discharged 4/4).  Spoke with pt, HIPPA verified.  Pt reports doing good since discharge.  Pt reports to f/u with Dr. Clayborn Bigness (heart MD) tomorrow, to set up appointment with  MD at [facility] about her problems with bowels.  Pt reports to f/u with Dr. Sabra Heck 4/11.   Pt reports weighed before hospitalization, has not been weighing since discharge.  Pt reports no swelling or sob. RN CM discussed with pt importance of weighing daily, record, call MD for weight gain of 3 lbs in a day, 5 lbs in a week.    Pt reports was doing her own medications prior to hospitalization but the increase in number of medications, daughter is helping (doing pill planner).   Pt gave permission to speak with daughter Trudee Grip to review discharge medications.   Discussed with pt transition of care program, plan to f/u with weekly calls 31 days post discharge, also provide a home visit.   Plan to f/u again with pt  telephonically 4/12 as part of ongoing transition of care. Plan to inform Dr. Sabra Heck of [facility] involvement- send barrier letter.   [name].   [place] [facility] Management  [phone]

## 2015-07-22 DIAGNOSIS — I6523 Occlusion and stenosis of bilateral carotid arteries: Secondary | ICD-10-CM | POA: Diagnosis not present

## 2015-07-22 DIAGNOSIS — R0602 Shortness of breath: Secondary | ICD-10-CM | POA: Diagnosis not present

## 2015-07-22 DIAGNOSIS — I350 Nonrheumatic aortic (valve) stenosis: Secondary | ICD-10-CM | POA: Diagnosis not present

## 2015-07-22 DIAGNOSIS — I5023 Acute on chronic systolic (congestive) heart failure: Secondary | ICD-10-CM | POA: Diagnosis not present

## 2015-07-22 DIAGNOSIS — I4891 Unspecified atrial fibrillation: Secondary | ICD-10-CM | POA: Diagnosis not present

## 2015-07-22 DIAGNOSIS — E871 Hypo-osmolality and hyponatremia: Secondary | ICD-10-CM | POA: Diagnosis not present

## 2015-07-22 DIAGNOSIS — R600 Localized edema: Secondary | ICD-10-CM | POA: Diagnosis not present

## 2015-07-22 DIAGNOSIS — R531 Weakness: Secondary | ICD-10-CM | POA: Diagnosis not present

## 2015-07-22 DIAGNOSIS — R5383 Other fatigue: Secondary | ICD-10-CM | POA: Diagnosis not present

## 2015-07-23 DIAGNOSIS — Z7982 Long term (current) use of aspirin: Secondary | ICD-10-CM | POA: Diagnosis not present

## 2015-07-23 DIAGNOSIS — Z7901 Long term (current) use of anticoagulants: Secondary | ICD-10-CM | POA: Diagnosis not present

## 2015-07-23 DIAGNOSIS — E871 Hypo-osmolality and hyponatremia: Secondary | ICD-10-CM | POA: Diagnosis not present

## 2015-07-23 DIAGNOSIS — I482 Chronic atrial fibrillation: Secondary | ICD-10-CM | POA: Diagnosis not present

## 2015-07-23 DIAGNOSIS — Z85038 Personal history of other malignant neoplasm of large intestine: Secondary | ICD-10-CM | POA: Diagnosis not present

## 2015-07-23 DIAGNOSIS — E875 Hyperkalemia: Secondary | ICD-10-CM | POA: Diagnosis not present

## 2015-07-23 DIAGNOSIS — I350 Nonrheumatic aortic (valve) stenosis: Secondary | ICD-10-CM | POA: Diagnosis not present

## 2015-07-23 DIAGNOSIS — Z742 Need for assistance at home and no other household member able to render care: Secondary | ICD-10-CM | POA: Diagnosis not present

## 2015-07-23 DIAGNOSIS — I5033 Acute on chronic diastolic (congestive) heart failure: Secondary | ICD-10-CM | POA: Diagnosis not present

## 2015-07-26 DIAGNOSIS — E875 Hyperkalemia: Secondary | ICD-10-CM | POA: Diagnosis not present

## 2015-07-26 DIAGNOSIS — I482 Chronic atrial fibrillation: Secondary | ICD-10-CM | POA: Diagnosis not present

## 2015-07-26 DIAGNOSIS — I5033 Acute on chronic diastolic (congestive) heart failure: Secondary | ICD-10-CM | POA: Diagnosis not present

## 2015-07-26 DIAGNOSIS — I350 Nonrheumatic aortic (valve) stenosis: Secondary | ICD-10-CM | POA: Diagnosis not present

## 2015-07-26 DIAGNOSIS — Z7982 Long term (current) use of aspirin: Secondary | ICD-10-CM | POA: Diagnosis not present

## 2015-07-26 DIAGNOSIS — Z85038 Personal history of other malignant neoplasm of large intestine: Secondary | ICD-10-CM | POA: Diagnosis not present

## 2015-07-26 DIAGNOSIS — Z742 Need for assistance at home and no other household member able to render care: Secondary | ICD-10-CM | POA: Diagnosis not present

## 2015-07-26 DIAGNOSIS — Z7901 Long term (current) use of anticoagulants: Secondary | ICD-10-CM | POA: Diagnosis not present

## 2015-07-26 DIAGNOSIS — E871 Hypo-osmolality and hyponatremia: Secondary | ICD-10-CM | POA: Diagnosis not present

## 2015-07-27 ENCOUNTER — Ambulatory Visit
Admission: RE | Admit: 2015-07-27 | Payer: Commercial Managed Care - HMO | Source: Ambulatory Visit | Admitting: Internal Medicine

## 2015-07-27 ENCOUNTER — Encounter: Admission: RE | Payer: Self-pay | Source: Ambulatory Visit

## 2015-07-27 DIAGNOSIS — I350 Nonrheumatic aortic (valve) stenosis: Secondary | ICD-10-CM | POA: Diagnosis not present

## 2015-07-27 DIAGNOSIS — I209 Angina pectoris, unspecified: Secondary | ICD-10-CM | POA: Diagnosis not present

## 2015-07-27 DIAGNOSIS — E871 Hypo-osmolality and hyponatremia: Secondary | ICD-10-CM | POA: Diagnosis not present

## 2015-07-27 LAB — PH, BODY FLUID: PH, BODY FLUID: 7.9

## 2015-07-27 SURGERY — CARDIOVERSION (CATH LAB)
Anesthesia: General

## 2015-07-28 ENCOUNTER — Other Ambulatory Visit: Payer: Self-pay | Admitting: *Deleted

## 2015-07-28 DIAGNOSIS — Z85038 Personal history of other malignant neoplasm of large intestine: Secondary | ICD-10-CM | POA: Diagnosis not present

## 2015-07-28 DIAGNOSIS — Z7901 Long term (current) use of anticoagulants: Secondary | ICD-10-CM | POA: Diagnosis not present

## 2015-07-28 DIAGNOSIS — I5033 Acute on chronic diastolic (congestive) heart failure: Secondary | ICD-10-CM | POA: Diagnosis not present

## 2015-07-28 DIAGNOSIS — Z742 Need for assistance at home and no other household member able to render care: Secondary | ICD-10-CM | POA: Diagnosis not present

## 2015-07-28 DIAGNOSIS — I350 Nonrheumatic aortic (valve) stenosis: Secondary | ICD-10-CM | POA: Diagnosis not present

## 2015-07-28 DIAGNOSIS — I482 Chronic atrial fibrillation: Secondary | ICD-10-CM | POA: Diagnosis not present

## 2015-07-28 DIAGNOSIS — E871 Hypo-osmolality and hyponatremia: Secondary | ICD-10-CM | POA: Diagnosis not present

## 2015-07-28 DIAGNOSIS — Z7982 Long term (current) use of aspirin: Secondary | ICD-10-CM | POA: Diagnosis not present

## 2015-07-28 DIAGNOSIS — E875 Hyperkalemia: Secondary | ICD-10-CM | POA: Diagnosis not present

## 2015-07-28 NOTE — Patient Outreach (Signed)
Transition of care call (week 2, discharged 4/4).  Pt reports getting more strength in her legs, weights  Down since discharge  from 158 lbs to 156.6 lbs.  Pt reports no sob, swelling.   Pt reports f/u with Dr. Sabra Heck, MD well pleased on how she is doing,told  can go ahead and get heart valve fixed.  Pt reports Dr. Sabra Heck is going to get in touch with Dr. Marcelline Deist about where surgery will be done as [facility] is not in her insurance network..  RN CM discussed with pt doing a home visit- part of transition of care to which pt agreed.  Home visit scheduled for 4/20.     Plan to f/u with pt 4/20- home visit (part of transition of care).     [name].   [place] Care Management  [phone]

## 2015-07-29 DIAGNOSIS — Z7901 Long term (current) use of anticoagulants: Secondary | ICD-10-CM | POA: Diagnosis not present

## 2015-07-29 DIAGNOSIS — Z742 Need for assistance at home and no other household member able to render care: Secondary | ICD-10-CM | POA: Diagnosis not present

## 2015-07-29 DIAGNOSIS — Z7982 Long term (current) use of aspirin: Secondary | ICD-10-CM | POA: Diagnosis not present

## 2015-07-29 DIAGNOSIS — I350 Nonrheumatic aortic (valve) stenosis: Secondary | ICD-10-CM | POA: Diagnosis not present

## 2015-07-29 DIAGNOSIS — I482 Chronic atrial fibrillation: Secondary | ICD-10-CM | POA: Diagnosis not present

## 2015-07-29 DIAGNOSIS — E875 Hyperkalemia: Secondary | ICD-10-CM | POA: Diagnosis not present

## 2015-07-29 DIAGNOSIS — Z85038 Personal history of other malignant neoplasm of large intestine: Secondary | ICD-10-CM | POA: Diagnosis not present

## 2015-07-29 DIAGNOSIS — I5033 Acute on chronic diastolic (congestive) heart failure: Secondary | ICD-10-CM | POA: Diagnosis not present

## 2015-07-29 DIAGNOSIS — E871 Hypo-osmolality and hyponatremia: Secondary | ICD-10-CM | POA: Diagnosis not present

## 2015-08-02 DIAGNOSIS — I5033 Acute on chronic diastolic (congestive) heart failure: Secondary | ICD-10-CM | POA: Diagnosis not present

## 2015-08-02 DIAGNOSIS — E871 Hypo-osmolality and hyponatremia: Secondary | ICD-10-CM | POA: Diagnosis not present

## 2015-08-02 DIAGNOSIS — Z85038 Personal history of other malignant neoplasm of large intestine: Secondary | ICD-10-CM | POA: Diagnosis not present

## 2015-08-02 DIAGNOSIS — I350 Nonrheumatic aortic (valve) stenosis: Secondary | ICD-10-CM | POA: Diagnosis not present

## 2015-08-02 DIAGNOSIS — Z7901 Long term (current) use of anticoagulants: Secondary | ICD-10-CM | POA: Diagnosis not present

## 2015-08-02 DIAGNOSIS — E875 Hyperkalemia: Secondary | ICD-10-CM | POA: Diagnosis not present

## 2015-08-02 DIAGNOSIS — Z7982 Long term (current) use of aspirin: Secondary | ICD-10-CM | POA: Diagnosis not present

## 2015-08-02 DIAGNOSIS — Z742 Need for assistance at home and no other household member able to render care: Secondary | ICD-10-CM | POA: Diagnosis not present

## 2015-08-02 DIAGNOSIS — I482 Chronic atrial fibrillation: Secondary | ICD-10-CM | POA: Diagnosis not present

## 2015-08-03 DIAGNOSIS — Z7982 Long term (current) use of aspirin: Secondary | ICD-10-CM | POA: Diagnosis not present

## 2015-08-03 DIAGNOSIS — I5033 Acute on chronic diastolic (congestive) heart failure: Secondary | ICD-10-CM | POA: Diagnosis not present

## 2015-08-03 DIAGNOSIS — I350 Nonrheumatic aortic (valve) stenosis: Secondary | ICD-10-CM | POA: Diagnosis not present

## 2015-08-03 DIAGNOSIS — E875 Hyperkalemia: Secondary | ICD-10-CM | POA: Diagnosis not present

## 2015-08-03 DIAGNOSIS — Z742 Need for assistance at home and no other household member able to render care: Secondary | ICD-10-CM | POA: Diagnosis not present

## 2015-08-03 DIAGNOSIS — E871 Hypo-osmolality and hyponatremia: Secondary | ICD-10-CM | POA: Diagnosis not present

## 2015-08-03 DIAGNOSIS — Z7901 Long term (current) use of anticoagulants: Secondary | ICD-10-CM | POA: Diagnosis not present

## 2015-08-03 DIAGNOSIS — I482 Chronic atrial fibrillation: Secondary | ICD-10-CM | POA: Diagnosis not present

## 2015-08-03 DIAGNOSIS — Z85038 Personal history of other malignant neoplasm of large intestine: Secondary | ICD-10-CM | POA: Diagnosis not present

## 2015-08-05 ENCOUNTER — Encounter: Payer: Self-pay | Admitting: *Deleted

## 2015-08-05 ENCOUNTER — Other Ambulatory Visit: Payer: Self-pay | Admitting: *Deleted

## 2015-08-05 DIAGNOSIS — I482 Chronic atrial fibrillation: Secondary | ICD-10-CM | POA: Diagnosis not present

## 2015-08-05 DIAGNOSIS — Z7901 Long term (current) use of anticoagulants: Secondary | ICD-10-CM | POA: Diagnosis not present

## 2015-08-05 DIAGNOSIS — E875 Hyperkalemia: Secondary | ICD-10-CM | POA: Diagnosis not present

## 2015-08-05 DIAGNOSIS — Z7982 Long term (current) use of aspirin: Secondary | ICD-10-CM | POA: Diagnosis not present

## 2015-08-05 DIAGNOSIS — I350 Nonrheumatic aortic (valve) stenosis: Secondary | ICD-10-CM | POA: Diagnosis not present

## 2015-08-05 DIAGNOSIS — Z742 Need for assistance at home and no other household member able to render care: Secondary | ICD-10-CM | POA: Diagnosis not present

## 2015-08-05 DIAGNOSIS — I5033 Acute on chronic diastolic (congestive) heart failure: Secondary | ICD-10-CM | POA: Diagnosis not present

## 2015-08-05 DIAGNOSIS — Z85038 Personal history of other malignant neoplasm of large intestine: Secondary | ICD-10-CM | POA: Diagnosis not present

## 2015-08-05 DIAGNOSIS — E871 Hypo-osmolality and hyponatremia: Secondary | ICD-10-CM | POA: Diagnosis not present

## 2015-08-06 NOTE — Patient Outreach (Signed)
[facility]) Care Management   Home visit done 08/05/15  Caroline Aguilar April 17, 1926 [id number]  Caroline Aguilar is an 82 y.o. female  Subjective:  Pt reports feels great, HH PT coming 2 times a week- helps.  Pt reports HH PT gave her breathing  Exercises to do which has helped her voice.  Pt reports she is going to [facility] 4/26 for an evaluation about heart  Valve replacement.   Pt reports weighing daily, recording, today 157 lbs., yesterday 156.4 lbs.   Objective:   Filed Vitals:   08/05/15 1526  BP: 104/50  Pulse: 66  Resp: 24    ROS  Physical Exam  Constitutional: She is oriented to person, place, and time. She appears well-developed and well-nourished.  Cardiovascular: Regular rhythm and normal heart sounds.   Respiratory: Effort normal and breath sounds normal.  GI: Soft. Bowel sounds are normal.  Musculoskeletal: Normal range of motion.  Uses walker   Neurological: She is alert and oriented to person, place, and time.  Skin: Skin is warm and dry.  Psychiatric: She has a normal mood and affect. Her behavior is normal. Judgment and thought content normal.    Encounter Medications:  Reviewed with pt  Outpatient Encounter Prescriptions as of 08/05/2015  Medication Sig Note  . amiodarone (PACERONE) 200 MG tablet Take 1 tablet (200 mg total) by mouth daily.   Marland Kitchen apixaban (ELIQUIS) 2.5 MG TABS tablet Take 1 tablet (2.5 mg total) by mouth 2 (two) times daily.   Marland Kitchen aspirin EC 81 MG tablet Take 81 mg by mouth at bedtime.   . Calcium Carbonate-Vitamin D (CALCIUM 600+D) 600-200 MG-UNIT TABS Take 1 tablet by mouth daily. 08/05/2015: Pt taking [place].    . feeding supplement, ENSURE ENLIVE, (ENSURE ENLIVE) LIQD Take 237 mLs by mouth 2 (two) times daily between meals.   . furosemide (LASIX) 40 MG tablet Take 1 tablet (40 mg total) by mouth 2 (two) times daily.   Marland Kitchen latanoprost (XALATAN) 0.005 % ophthalmic solution Place 1 drop into both eyes at bedtime.   .  pantoprazole (PROTONIX) 40 MG tablet Take 40 mg by mouth daily.   . potassium chloride (KLOR-CON) 20 MEQ packet Take 20 mEq by mouth 2 (two) times daily.   Marland Kitchen senna-docusate (SENOKOT-S) 8.6-50 MG tablet Take 1 tablet by mouth daily.   . sertraline (ZOLOFT) 50 MG tablet Take 50 mg by mouth at bedtime. Reported on 07/04/2015   . simvastatin (ZOCOR) 20 MG tablet Take 20 mg by mouth at bedtime.   . sodium chloride 1 g tablet Take 1 tablet (1 g total) by mouth 2 (two) times daily with a meal.   . timolol (TIMOPTIC) 0.5 % ophthalmic solution Place 1 drop into both eyes 2 (two) times daily.   Marland Kitchen docusate sodium (COLACE) 100 MG capsule Take 100 mg by mouth daily. Reported on 08/05/2015    No facility-administered encounter medications on file as of 08/05/2015.    Functional Status:   In your present state of health, do you have any difficulty performing the following activities: 08/05/2015 07/15/2015  Hearing? [place]? - -  Difficulty concentrating or making decisions? N -  Walking or climbing stairs? Y -  Dressing or bathing? N -  Doing errands, shopping? Y N  Preparing Food and eating ? N -  Using the Toilet? N -  In the past six months, have you accidently leaked urine? Y -  Do you have problems with loss  of bowel control? N -  Managing your Medications? Y -  Managing your Finances? N -  Housekeeping or managing your Housekeeping? Y -    Fall/Depression Screening:    PHQ 2/9 Scores 08/05/2015  PHQ - 2 Score 0    Assessment:   Pleasant 82 yr old woman,lives by herself, granddaughter lives beyond her, son close by, both very supportive.   HF- lungs clear, no edema.  View of recorded weights = 156-158 lbs., today 157 lbs. [facility] calendar provided  To record weights, HF zones to review).    Plan:  Pt to f/u at [facility] 4/27- evaluation for heart valve replacement             Pt to continue to weigh daily, record, call MD for weight gain of 3 lbs in a day, 5 lbs in a week.             Plan  to continue to follow pt for transition of care, f/u again telephonically 4/27.            Plan to send Dr. Sabra Heck 4/20 home visit encounter.    [facility] CM Care Plan Problem One        Most Recent Value   Care Plan Problem One  Risk for readmission related to recent hospitalization HF    Role Documenting the Problem One  Care Management Coordinator   Care Plan for Problem One  Active   [facility] Long Term Goal (31-90 days)  Pt would not readmit 31 days from day of discharge    [place] Term Goal Start Date  07/21/15   Interventions for Problem One Long Term Goal  home visit done- part of transition of care    [facility] CM Short Term Goal #1 (0-30 days)  Pt would weigh daily,record for the next 30 days    [facility] CM Short Term Goal #1 Start Date  07/21/15   [facility] CM Short Term Goal #1 Met Date  08/05/15   Interventions for Short Term Goal #1  Reinforced with pt doing daily weights/recording.     [facility] CM Short Term Goal #2 (0-30 days)  Pt would be more familiar with  zones of HF in the next 14 days    [facility] CM Short Term Goal #2 Start Date  08/05/15   Interventions for Short Term Goal #2  Provided pt with information on HF zones, reviewed.      Caroline Aguilar.   [place] Care Management  [phone]

## 2015-08-09 DIAGNOSIS — E871 Hypo-osmolality and hyponatremia: Secondary | ICD-10-CM | POA: Diagnosis not present

## 2015-08-09 DIAGNOSIS — Z7901 Long term (current) use of anticoagulants: Secondary | ICD-10-CM | POA: Diagnosis not present

## 2015-08-09 DIAGNOSIS — I482 Chronic atrial fibrillation: Secondary | ICD-10-CM | POA: Diagnosis not present

## 2015-08-09 DIAGNOSIS — I5033 Acute on chronic diastolic (congestive) heart failure: Secondary | ICD-10-CM | POA: Diagnosis not present

## 2015-08-09 DIAGNOSIS — Z742 Need for assistance at home and no other household member able to render care: Secondary | ICD-10-CM | POA: Diagnosis not present

## 2015-08-09 DIAGNOSIS — E875 Hyperkalemia: Secondary | ICD-10-CM | POA: Diagnosis not present

## 2015-08-09 DIAGNOSIS — Z85038 Personal history of other malignant neoplasm of large intestine: Secondary | ICD-10-CM | POA: Diagnosis not present

## 2015-08-09 DIAGNOSIS — I350 Nonrheumatic aortic (valve) stenosis: Secondary | ICD-10-CM | POA: Diagnosis not present

## 2015-08-09 DIAGNOSIS — Z7982 Long term (current) use of aspirin: Secondary | ICD-10-CM | POA: Diagnosis not present

## 2015-08-10 DIAGNOSIS — Z742 Need for assistance at home and no other household member able to render care: Secondary | ICD-10-CM | POA: Diagnosis not present

## 2015-08-10 DIAGNOSIS — E875 Hyperkalemia: Secondary | ICD-10-CM | POA: Diagnosis not present

## 2015-08-10 DIAGNOSIS — I350 Nonrheumatic aortic (valve) stenosis: Secondary | ICD-10-CM | POA: Diagnosis not present

## 2015-08-10 DIAGNOSIS — Z85038 Personal history of other malignant neoplasm of large intestine: Secondary | ICD-10-CM | POA: Diagnosis not present

## 2015-08-10 DIAGNOSIS — Z7901 Long term (current) use of anticoagulants: Secondary | ICD-10-CM | POA: Diagnosis not present

## 2015-08-10 DIAGNOSIS — I5033 Acute on chronic diastolic (congestive) heart failure: Secondary | ICD-10-CM | POA: Diagnosis not present

## 2015-08-10 DIAGNOSIS — I482 Chronic atrial fibrillation: Secondary | ICD-10-CM | POA: Diagnosis not present

## 2015-08-10 DIAGNOSIS — Z7982 Long term (current) use of aspirin: Secondary | ICD-10-CM | POA: Diagnosis not present

## 2015-08-10 DIAGNOSIS — E871 Hypo-osmolality and hyponatremia: Secondary | ICD-10-CM | POA: Diagnosis not present

## 2015-08-12 ENCOUNTER — Other Ambulatory Visit: Payer: Self-pay | Admitting: *Deleted

## 2015-08-12 NOTE — Patient Outreach (Signed)
Transition of care call (week 4).  Spoke with pt who reports weights doing good,checking daily, recording.  Pt reports today's weight 156.2 lbs , yesterday 156.4 lbs , ranges 156-157 lbs.  Pt reports eating nothing salty or sweet, tastes too salty or sweet.  Pt reports becoming more familiar with HF zones.  Pt reports got dates mixed up about f/u at [facility] for evaluation for heart valve replacement, son told appointment is 5/3.   RN CM discussed with pt f/u again telephonically on 5/4 as part of ongoing transition of care.   Addendum- called pt back, changed f/u phone call to 5/5 to which pt agreed.    [name].   [place] Care Management  [phone]

## 2015-08-13 DIAGNOSIS — Z85038 Personal history of other malignant neoplasm of large intestine: Secondary | ICD-10-CM | POA: Diagnosis not present

## 2015-08-13 DIAGNOSIS — I5033 Acute on chronic diastolic (congestive) heart failure: Secondary | ICD-10-CM | POA: Diagnosis not present

## 2015-08-13 DIAGNOSIS — Z7982 Long term (current) use of aspirin: Secondary | ICD-10-CM | POA: Diagnosis not present

## 2015-08-13 DIAGNOSIS — E875 Hyperkalemia: Secondary | ICD-10-CM | POA: Diagnosis not present

## 2015-08-13 DIAGNOSIS — Z7901 Long term (current) use of anticoagulants: Secondary | ICD-10-CM | POA: Diagnosis not present

## 2015-08-13 DIAGNOSIS — I482 Chronic atrial fibrillation: Secondary | ICD-10-CM | POA: Diagnosis not present

## 2015-08-13 DIAGNOSIS — E871 Hypo-osmolality and hyponatremia: Secondary | ICD-10-CM | POA: Diagnosis not present

## 2015-08-13 DIAGNOSIS — I350 Nonrheumatic aortic (valve) stenosis: Secondary | ICD-10-CM | POA: Diagnosis not present

## 2015-08-13 DIAGNOSIS — Z742 Need for assistance at home and no other household member able to render care: Secondary | ICD-10-CM | POA: Diagnosis not present

## 2015-08-17 DIAGNOSIS — E875 Hyperkalemia: Secondary | ICD-10-CM | POA: Diagnosis not present

## 2015-08-17 DIAGNOSIS — Z7901 Long term (current) use of anticoagulants: Secondary | ICD-10-CM | POA: Diagnosis not present

## 2015-08-17 DIAGNOSIS — Z742 Need for assistance at home and no other household member able to render care: Secondary | ICD-10-CM | POA: Diagnosis not present

## 2015-08-17 DIAGNOSIS — I482 Chronic atrial fibrillation: Secondary | ICD-10-CM | POA: Diagnosis not present

## 2015-08-17 DIAGNOSIS — I350 Nonrheumatic aortic (valve) stenosis: Secondary | ICD-10-CM | POA: Diagnosis not present

## 2015-08-17 DIAGNOSIS — Z7982 Long term (current) use of aspirin: Secondary | ICD-10-CM | POA: Diagnosis not present

## 2015-08-17 DIAGNOSIS — I5033 Acute on chronic diastolic (congestive) heart failure: Secondary | ICD-10-CM | POA: Diagnosis not present

## 2015-08-17 DIAGNOSIS — Z85038 Personal history of other malignant neoplasm of large intestine: Secondary | ICD-10-CM | POA: Diagnosis not present

## 2015-08-17 DIAGNOSIS — E871 Hypo-osmolality and hyponatremia: Secondary | ICD-10-CM | POA: Diagnosis not present

## 2015-08-18 DIAGNOSIS — I712 Thoracic aortic aneurysm, without rupture: Secondary | ICD-10-CM | POA: Diagnosis not present

## 2015-08-18 DIAGNOSIS — I2510 Atherosclerotic heart disease of native coronary artery without angina pectoris: Secondary | ICD-10-CM | POA: Diagnosis not present

## 2015-08-18 DIAGNOSIS — R935 Abnormal findings on diagnostic imaging of other abdominal regions, including retroperitoneum: Secondary | ICD-10-CM | POA: Diagnosis not present

## 2015-08-18 DIAGNOSIS — I480 Paroxysmal atrial fibrillation: Secondary | ICD-10-CM | POA: Diagnosis not present

## 2015-08-18 DIAGNOSIS — K5730 Diverticulosis of large intestine without perforation or abscess without bleeding: Secondary | ICD-10-CM | POA: Diagnosis not present

## 2015-08-18 DIAGNOSIS — I350 Nonrheumatic aortic (valve) stenosis: Secondary | ICD-10-CM | POA: Diagnosis not present

## 2015-08-18 DIAGNOSIS — I6523 Occlusion and stenosis of bilateral carotid arteries: Secondary | ICD-10-CM | POA: Diagnosis not present

## 2015-08-18 DIAGNOSIS — I5023 Acute on chronic systolic (congestive) heart failure: Secondary | ICD-10-CM | POA: Diagnosis not present

## 2015-08-18 DIAGNOSIS — R0602 Shortness of breath: Secondary | ICD-10-CM | POA: Diagnosis not present

## 2015-08-19 DIAGNOSIS — I5033 Acute on chronic diastolic (congestive) heart failure: Secondary | ICD-10-CM | POA: Diagnosis not present

## 2015-08-19 DIAGNOSIS — E875 Hyperkalemia: Secondary | ICD-10-CM | POA: Diagnosis not present

## 2015-08-19 DIAGNOSIS — Z85038 Personal history of other malignant neoplasm of large intestine: Secondary | ICD-10-CM | POA: Diagnosis not present

## 2015-08-19 DIAGNOSIS — Z7982 Long term (current) use of aspirin: Secondary | ICD-10-CM | POA: Diagnosis not present

## 2015-08-19 DIAGNOSIS — Z7901 Long term (current) use of anticoagulants: Secondary | ICD-10-CM | POA: Diagnosis not present

## 2015-08-19 DIAGNOSIS — I482 Chronic atrial fibrillation: Secondary | ICD-10-CM | POA: Diagnosis not present

## 2015-08-19 DIAGNOSIS — I350 Nonrheumatic aortic (valve) stenosis: Secondary | ICD-10-CM | POA: Diagnosis not present

## 2015-08-19 DIAGNOSIS — Z742 Need for assistance at home and no other household member able to render care: Secondary | ICD-10-CM | POA: Diagnosis not present

## 2015-08-19 DIAGNOSIS — E871 Hypo-osmolality and hyponatremia: Secondary | ICD-10-CM | POA: Diagnosis not present

## 2015-08-20 ENCOUNTER — Encounter: Payer: Self-pay | Admitting: *Deleted

## 2015-08-20 ENCOUNTER — Other Ambulatory Visit: Payer: Self-pay | Admitting: *Deleted

## 2015-08-20 DIAGNOSIS — Z7982 Long term (current) use of aspirin: Secondary | ICD-10-CM | POA: Diagnosis not present

## 2015-08-20 DIAGNOSIS — Z85038 Personal history of other malignant neoplasm of large intestine: Secondary | ICD-10-CM | POA: Diagnosis not present

## 2015-08-20 DIAGNOSIS — I5033 Acute on chronic diastolic (congestive) heart failure: Secondary | ICD-10-CM | POA: Diagnosis not present

## 2015-08-20 DIAGNOSIS — E875 Hyperkalemia: Secondary | ICD-10-CM | POA: Diagnosis not present

## 2015-08-20 DIAGNOSIS — E871 Hypo-osmolality and hyponatremia: Secondary | ICD-10-CM | POA: Diagnosis not present

## 2015-08-20 DIAGNOSIS — I482 Chronic atrial fibrillation: Secondary | ICD-10-CM | POA: Diagnosis not present

## 2015-08-20 DIAGNOSIS — Z7901 Long term (current) use of anticoagulants: Secondary | ICD-10-CM | POA: Diagnosis not present

## 2015-08-20 DIAGNOSIS — I350 Nonrheumatic aortic (valve) stenosis: Secondary | ICD-10-CM | POA: Diagnosis not present

## 2015-08-20 DIAGNOSIS — Z742 Need for assistance at home and no other household member able to render care: Secondary | ICD-10-CM | POA: Diagnosis not present

## 2015-08-20 NOTE — Patient Outreach (Signed)
Final transition of care call successful.   Spoke with pt who reports weights doing good, today 158.6 lbs, yesterday 157.8 lbs. (no weight gain of > 2 lbs in a day, 5 lbs in a week).  Pt reports f/u with MD at [facility] for heart valve evaluation.  Pt reports was told to wait 4-6 weeks, give medication more time.  Pt reports she was told is a candidate, everything looked good, need to build up her strength.  Pt reports MD will call her if they decide to do the surgery.  Discussed with pt this is final transition of care, goals met, no further case management needs, to close case to which pt agreed.    Plan to send Dr. Sabra Heck case closure letter.  As discussed with pt, plan to have case closure letter sent to her- includes [facility]'s main contact number to call if needs arise in the future.    Plan to inform [facility] care management assistant to close case.   [name].   [place] Care Management  [phone]

## 2015-08-24 DIAGNOSIS — I5033 Acute on chronic diastolic (congestive) heart failure: Secondary | ICD-10-CM | POA: Diagnosis not present

## 2015-08-24 DIAGNOSIS — I480 Paroxysmal atrial fibrillation: Secondary | ICD-10-CM | POA: Diagnosis not present

## 2015-08-24 DIAGNOSIS — I350 Nonrheumatic aortic (valve) stenosis: Secondary | ICD-10-CM | POA: Diagnosis not present

## 2015-08-26 DIAGNOSIS — Z85038 Personal history of other malignant neoplasm of large intestine: Secondary | ICD-10-CM | POA: Diagnosis not present

## 2015-08-26 DIAGNOSIS — I5033 Acute on chronic diastolic (congestive) heart failure: Secondary | ICD-10-CM | POA: Diagnosis not present

## 2015-08-26 DIAGNOSIS — I482 Chronic atrial fibrillation: Secondary | ICD-10-CM | POA: Diagnosis not present

## 2015-08-26 DIAGNOSIS — E871 Hypo-osmolality and hyponatremia: Secondary | ICD-10-CM | POA: Diagnosis not present

## 2015-08-26 DIAGNOSIS — Z7982 Long term (current) use of aspirin: Secondary | ICD-10-CM | POA: Diagnosis not present

## 2015-08-26 DIAGNOSIS — I350 Nonrheumatic aortic (valve) stenosis: Secondary | ICD-10-CM | POA: Diagnosis not present

## 2015-08-26 DIAGNOSIS — Z7901 Long term (current) use of anticoagulants: Secondary | ICD-10-CM | POA: Diagnosis not present

## 2015-08-26 DIAGNOSIS — E875 Hyperkalemia: Secondary | ICD-10-CM | POA: Diagnosis not present

## 2015-08-26 DIAGNOSIS — Z742 Need for assistance at home and no other household member able to render care: Secondary | ICD-10-CM | POA: Diagnosis not present

## 2015-09-01 DIAGNOSIS — E875 Hyperkalemia: Secondary | ICD-10-CM | POA: Diagnosis not present

## 2015-09-01 DIAGNOSIS — E871 Hypo-osmolality and hyponatremia: Secondary | ICD-10-CM | POA: Diagnosis not present

## 2015-09-01 DIAGNOSIS — I482 Chronic atrial fibrillation: Secondary | ICD-10-CM | POA: Diagnosis not present

## 2015-09-01 DIAGNOSIS — I5033 Acute on chronic diastolic (congestive) heart failure: Secondary | ICD-10-CM | POA: Diagnosis not present

## 2015-09-01 DIAGNOSIS — Z742 Need for assistance at home and no other household member able to render care: Secondary | ICD-10-CM | POA: Diagnosis not present

## 2015-09-01 DIAGNOSIS — I350 Nonrheumatic aortic (valve) stenosis: Secondary | ICD-10-CM | POA: Diagnosis not present

## 2015-09-01 DIAGNOSIS — Z7982 Long term (current) use of aspirin: Secondary | ICD-10-CM | POA: Diagnosis not present

## 2015-09-01 DIAGNOSIS — Z85038 Personal history of other malignant neoplasm of large intestine: Secondary | ICD-10-CM | POA: Diagnosis not present

## 2015-09-01 DIAGNOSIS — Z7901 Long term (current) use of anticoagulants: Secondary | ICD-10-CM | POA: Diagnosis not present

## 2015-09-15 DIAGNOSIS — Z7901 Long term (current) use of anticoagulants: Secondary | ICD-10-CM | POA: Diagnosis not present

## 2015-09-15 DIAGNOSIS — I482 Chronic atrial fibrillation: Secondary | ICD-10-CM | POA: Diagnosis not present

## 2015-09-15 DIAGNOSIS — I350 Nonrheumatic aortic (valve) stenosis: Secondary | ICD-10-CM | POA: Diagnosis not present

## 2015-09-15 DIAGNOSIS — E875 Hyperkalemia: Secondary | ICD-10-CM | POA: Diagnosis not present

## 2015-09-15 DIAGNOSIS — E871 Hypo-osmolality and hyponatremia: Secondary | ICD-10-CM | POA: Diagnosis not present

## 2015-09-15 DIAGNOSIS — Z742 Need for assistance at home and no other household member able to render care: Secondary | ICD-10-CM | POA: Diagnosis not present

## 2015-09-15 DIAGNOSIS — I5033 Acute on chronic diastolic (congestive) heart failure: Secondary | ICD-10-CM | POA: Diagnosis not present

## 2015-09-15 DIAGNOSIS — Z85038 Personal history of other malignant neoplasm of large intestine: Secondary | ICD-10-CM | POA: Diagnosis not present

## 2015-09-15 DIAGNOSIS — Z7982 Long term (current) use of aspirin: Secondary | ICD-10-CM | POA: Diagnosis not present

## 2015-09-29 DIAGNOSIS — I480 Paroxysmal atrial fibrillation: Secondary | ICD-10-CM | POA: Diagnosis not present

## 2015-09-29 DIAGNOSIS — I2510 Atherosclerotic heart disease of native coronary artery without angina pectoris: Secondary | ICD-10-CM | POA: Diagnosis not present

## 2015-09-29 DIAGNOSIS — K219 Gastro-esophageal reflux disease without esophagitis: Secondary | ICD-10-CM | POA: Diagnosis not present

## 2015-09-29 DIAGNOSIS — Z01818 Encounter for other preprocedural examination: Secondary | ICD-10-CM | POA: Diagnosis not present

## 2015-09-29 DIAGNOSIS — I517 Cardiomegaly: Secondary | ICD-10-CM | POA: Diagnosis not present

## 2015-09-29 DIAGNOSIS — M4185 Other forms of scoliosis, thoracolumbar region: Secondary | ICD-10-CM | POA: Diagnosis not present

## 2015-09-29 DIAGNOSIS — I5023 Acute on chronic systolic (congestive) heart failure: Secondary | ICD-10-CM | POA: Diagnosis not present

## 2015-09-29 DIAGNOSIS — M9982 Other biomechanical lesions of thoracic region: Secondary | ICD-10-CM | POA: Diagnosis not present

## 2015-09-29 DIAGNOSIS — I77810 Thoracic aortic ectasia: Secondary | ICD-10-CM | POA: Diagnosis not present

## 2015-09-29 DIAGNOSIS — F329 Major depressive disorder, single episode, unspecified: Secondary | ICD-10-CM | POA: Diagnosis not present

## 2015-09-29 DIAGNOSIS — I10 Essential (primary) hypertension: Secondary | ICD-10-CM | POA: Diagnosis not present

## 2015-09-29 DIAGNOSIS — R0602 Shortness of breath: Secondary | ICD-10-CM | POA: Diagnosis not present

## 2015-09-29 DIAGNOSIS — I350 Nonrheumatic aortic (valve) stenosis: Secondary | ICD-10-CM | POA: Diagnosis not present

## 2015-10-04 HISTORY — PX: TRANSCATHETER AORTIC VALVE REPLACEMENT, TRANSFEMORAL: SHX6400

## 2015-10-05 DIAGNOSIS — Z006 Encounter for examination for normal comparison and control in clinical research program: Secondary | ICD-10-CM | POA: Diagnosis not present

## 2015-10-05 DIAGNOSIS — Z952 Presence of prosthetic heart valve: Secondary | ICD-10-CM | POA: Diagnosis not present

## 2015-10-05 DIAGNOSIS — I5020 Unspecified systolic (congestive) heart failure: Secondary | ICD-10-CM | POA: Diagnosis not present

## 2015-10-05 DIAGNOSIS — R001 Bradycardia, unspecified: Secondary | ICD-10-CM | POA: Diagnosis not present

## 2015-10-05 DIAGNOSIS — E871 Hypo-osmolality and hyponatremia: Secondary | ICD-10-CM | POA: Diagnosis not present

## 2015-10-05 DIAGNOSIS — I517 Cardiomegaly: Secondary | ICD-10-CM | POA: Diagnosis not present

## 2015-10-05 DIAGNOSIS — G629 Polyneuropathy, unspecified: Secondary | ICD-10-CM | POA: Diagnosis not present

## 2015-10-05 DIAGNOSIS — I5023 Acute on chronic systolic (congestive) heart failure: Secondary | ICD-10-CM | POA: Diagnosis not present

## 2015-10-05 DIAGNOSIS — I2510 Atherosclerotic heart disease of native coronary artery without angina pectoris: Secondary | ICD-10-CM | POA: Diagnosis not present

## 2015-10-05 DIAGNOSIS — Z9889 Other specified postprocedural states: Secondary | ICD-10-CM | POA: Diagnosis not present

## 2015-10-05 DIAGNOSIS — I6523 Occlusion and stenosis of bilateral carotid arteries: Secondary | ICD-10-CM | POA: Diagnosis not present

## 2015-10-05 DIAGNOSIS — I350 Nonrheumatic aortic (valve) stenosis: Secondary | ICD-10-CM | POA: Diagnosis not present

## 2015-10-05 DIAGNOSIS — I058 Other rheumatic mitral valve diseases: Secondary | ICD-10-CM | POA: Diagnosis not present

## 2015-10-05 DIAGNOSIS — I480 Paroxysmal atrial fibrillation: Secondary | ICD-10-CM | POA: Diagnosis not present

## 2015-10-05 DIAGNOSIS — D62 Acute posthemorrhagic anemia: Secondary | ICD-10-CM | POA: Diagnosis not present

## 2015-10-12 DIAGNOSIS — I5033 Acute on chronic diastolic (congestive) heart failure: Secondary | ICD-10-CM | POA: Diagnosis not present

## 2015-10-12 DIAGNOSIS — I350 Nonrheumatic aortic (valve) stenosis: Secondary | ICD-10-CM | POA: Diagnosis not present

## 2015-10-15 DIAGNOSIS — Z952 Presence of prosthetic heart valve: Secondary | ICD-10-CM | POA: Diagnosis not present

## 2015-10-15 DIAGNOSIS — I739 Peripheral vascular disease, unspecified: Secondary | ICD-10-CM | POA: Diagnosis not present

## 2015-10-15 DIAGNOSIS — K219 Gastro-esophageal reflux disease without esophagitis: Secondary | ICD-10-CM | POA: Diagnosis not present

## 2015-10-15 DIAGNOSIS — I6523 Occlusion and stenosis of bilateral carotid arteries: Secondary | ICD-10-CM | POA: Diagnosis not present

## 2015-10-15 DIAGNOSIS — I2510 Atherosclerotic heart disease of native coronary artery without angina pectoris: Secondary | ICD-10-CM | POA: Diagnosis not present

## 2015-10-15 DIAGNOSIS — E785 Hyperlipidemia, unspecified: Secondary | ICD-10-CM | POA: Diagnosis not present

## 2015-10-15 DIAGNOSIS — I5030 Unspecified diastolic (congestive) heart failure: Secondary | ICD-10-CM | POA: Diagnosis not present

## 2015-10-15 DIAGNOSIS — E871 Hypo-osmolality and hyponatremia: Secondary | ICD-10-CM | POA: Diagnosis not present

## 2015-10-15 DIAGNOSIS — I350 Nonrheumatic aortic (valve) stenosis: Secondary | ICD-10-CM | POA: Diagnosis not present

## 2015-10-15 DIAGNOSIS — I5032 Chronic diastolic (congestive) heart failure: Secondary | ICD-10-CM | POA: Diagnosis not present

## 2015-10-15 DIAGNOSIS — I480 Paroxysmal atrial fibrillation: Secondary | ICD-10-CM | POA: Diagnosis not present

## 2015-10-20 DIAGNOSIS — I4891 Unspecified atrial fibrillation: Secondary | ICD-10-CM | POA: Diagnosis not present

## 2015-10-20 DIAGNOSIS — R531 Weakness: Secondary | ICD-10-CM | POA: Diagnosis not present

## 2015-10-20 DIAGNOSIS — Z952 Presence of prosthetic heart valve: Secondary | ICD-10-CM | POA: Diagnosis not present

## 2015-10-20 DIAGNOSIS — I350 Nonrheumatic aortic (valve) stenosis: Secondary | ICD-10-CM | POA: Diagnosis not present

## 2015-10-20 DIAGNOSIS — I6523 Occlusion and stenosis of bilateral carotid arteries: Secondary | ICD-10-CM | POA: Diagnosis not present

## 2015-10-20 DIAGNOSIS — R600 Localized edema: Secondary | ICD-10-CM | POA: Diagnosis not present

## 2015-10-20 DIAGNOSIS — R5383 Other fatigue: Secondary | ICD-10-CM | POA: Diagnosis not present

## 2015-10-20 DIAGNOSIS — E871 Hypo-osmolality and hyponatremia: Secondary | ICD-10-CM | POA: Diagnosis not present

## 2015-10-20 DIAGNOSIS — R0602 Shortness of breath: Secondary | ICD-10-CM | POA: Diagnosis not present

## 2015-10-28 ENCOUNTER — Ambulatory Visit
Admission: RE | Admit: 2015-10-28 | Discharge: 2015-10-28 | Disposition: A | Discharge status: Home / Self Care | Payer: Commercial Managed Care - HMO | Source: Ambulatory Visit | Attending: Internal Medicine | Admitting: Internal Medicine

## 2015-10-28 ENCOUNTER — Other Ambulatory Visit: Payer: Self-pay | Admitting: Internal Medicine

## 2015-10-28 DIAGNOSIS — G9340 Encephalopathy, unspecified: Secondary | ICD-10-CM

## 2015-10-28 DIAGNOSIS — S0990XA Unspecified injury of head, initial encounter: Secondary | ICD-10-CM | POA: Diagnosis not present

## 2015-10-28 DIAGNOSIS — R51 Headache: Secondary | ICD-10-CM | POA: Diagnosis not present

## 2015-10-28 DIAGNOSIS — M25561 Pain in right knee: Secondary | ICD-10-CM | POA: Diagnosis not present

## 2015-10-29 DIAGNOSIS — S8001XA Contusion of right knee, initial encounter: Secondary | ICD-10-CM | POA: Diagnosis not present

## 2015-11-05 DIAGNOSIS — I350 Nonrheumatic aortic (valve) stenosis: Secondary | ICD-10-CM | POA: Diagnosis not present

## 2015-11-05 DIAGNOSIS — I5033 Acute on chronic diastolic (congestive) heart failure: Secondary | ICD-10-CM | POA: Diagnosis not present

## 2015-11-10 ENCOUNTER — Ambulatory Visit
Admission: RE | Admit: 2015-11-10 | Discharge: 2015-11-10 | Disposition: A | Discharge status: Home / Self Care | Payer: Commercial Managed Care - HMO | Source: Ambulatory Visit | Attending: Internal Medicine | Admitting: Internal Medicine

## 2015-11-10 ENCOUNTER — Other Ambulatory Visit: Payer: Self-pay | Admitting: Internal Medicine

## 2015-11-10 DIAGNOSIS — R600 Localized edema: Secondary | ICD-10-CM

## 2015-11-10 DIAGNOSIS — M79604 Pain in right leg: Secondary | ICD-10-CM | POA: Diagnosis not present

## 2015-11-16 DIAGNOSIS — M79661 Pain in right lower leg: Secondary | ICD-10-CM | POA: Diagnosis not present

## 2015-11-16 DIAGNOSIS — M79604 Pain in right leg: Secondary | ICD-10-CM | POA: Diagnosis not present

## 2015-11-17 DIAGNOSIS — Z952 Presence of prosthetic heart valve: Secondary | ICD-10-CM | POA: Diagnosis not present

## 2015-11-17 DIAGNOSIS — I480 Paroxysmal atrial fibrillation: Secondary | ICD-10-CM | POA: Diagnosis not present

## 2015-11-17 DIAGNOSIS — R0602 Shortness of breath: Secondary | ICD-10-CM | POA: Diagnosis not present

## 2015-11-17 DIAGNOSIS — I5032 Chronic diastolic (congestive) heart failure: Secondary | ICD-10-CM | POA: Diagnosis not present

## 2015-11-17 DIAGNOSIS — I350 Nonrheumatic aortic (valve) stenosis: Secondary | ICD-10-CM | POA: Diagnosis not present

## 2015-11-17 DIAGNOSIS — Z9181 History of falling: Secondary | ICD-10-CM | POA: Diagnosis not present

## 2015-11-17 DIAGNOSIS — I340 Nonrheumatic mitral (valve) insufficiency: Secondary | ICD-10-CM | POA: Diagnosis not present

## 2015-11-25 DIAGNOSIS — M79604 Pain in right leg: Secondary | ICD-10-CM | POA: Diagnosis not present

## 2016-02-25 DIAGNOSIS — R112 Nausea with vomiting, unspecified: Secondary | ICD-10-CM | POA: Diagnosis not present

## 2016-02-25 DIAGNOSIS — M5430 Sciatica, unspecified side: Secondary | ICD-10-CM | POA: Diagnosis not present

## 2016-02-25 DIAGNOSIS — Z79899 Other long term (current) drug therapy: Secondary | ICD-10-CM | POA: Diagnosis not present

## 2016-02-29 DIAGNOSIS — G9340 Encephalopathy, unspecified: Secondary | ICD-10-CM | POA: Diagnosis not present

## 2016-02-29 DIAGNOSIS — E871 Hypo-osmolality and hyponatremia: Secondary | ICD-10-CM | POA: Diagnosis not present

## 2016-02-29 DIAGNOSIS — M5116 Intervertebral disc disorders with radiculopathy, lumbar region: Secondary | ICD-10-CM | POA: Diagnosis not present

## 2016-04-11 IMAGING — CT CT ANGIO CHEST
2 of 6 series · 18 of 46 positions shown · IV contrast (APPLIED)
Comparison: Chest radiograph performed 06/18/2015

CLINICAL DATA: Acute onset of generalized chest pain, radiating to
the neck and back. Initial encounter.

EXAM:
CT ANGIOGRAPHY CHEST WITH CONTRAST
TECHNIQUE: Multidetector CT imaging of the chest was performed using the
standard protocol during bolus administration of intravenous
contrast. Multiplanar CT image reconstructions and MIPs were
obtained to evaluate the vascular anatomy.
CONTRAST:  100mL OMNIPAQUE IOHEXOL 350 MG/ML SOLN

[Series 6: pe thins 1.5 · axial · 0.68mm/px · z∈[-313,-60]mm · 15 of 237 slices shown]
[im 13/237  lung]
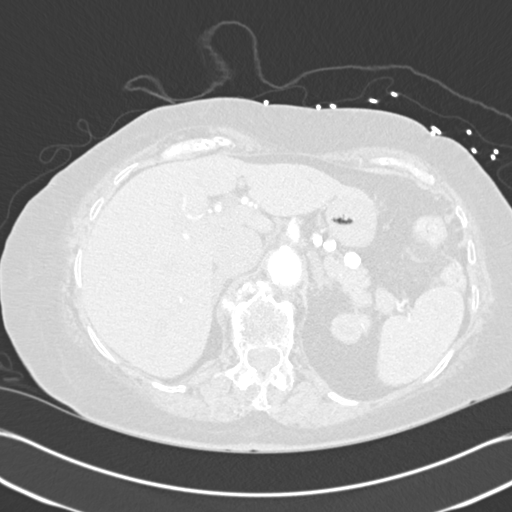
[im 25/237  soft-tissue]
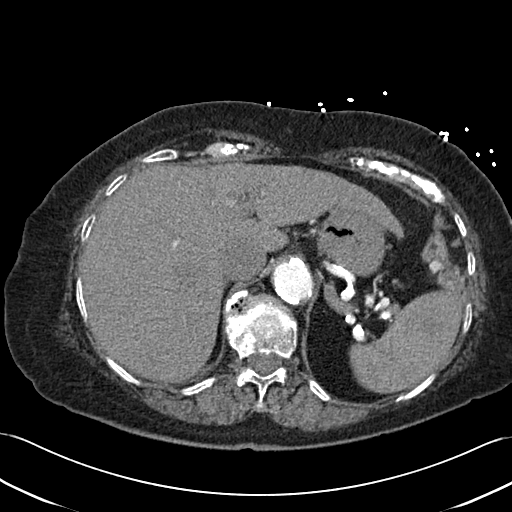
[im 50/237  lung]
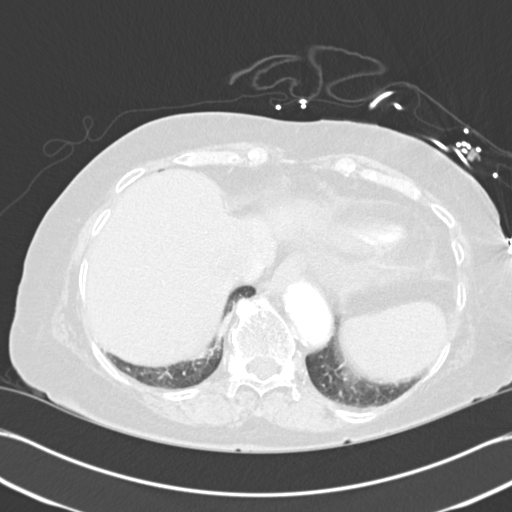
[im 63/237  soft-tissue]
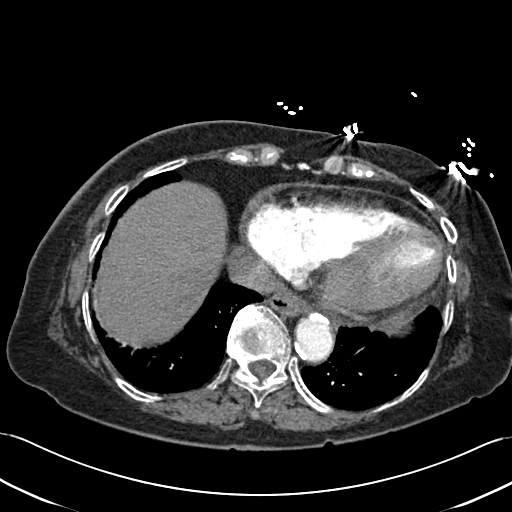
[im 75/237  lung]
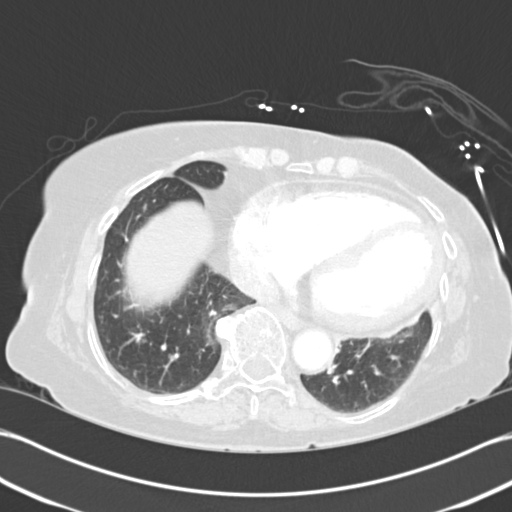
[im 87/237  soft-tissue]
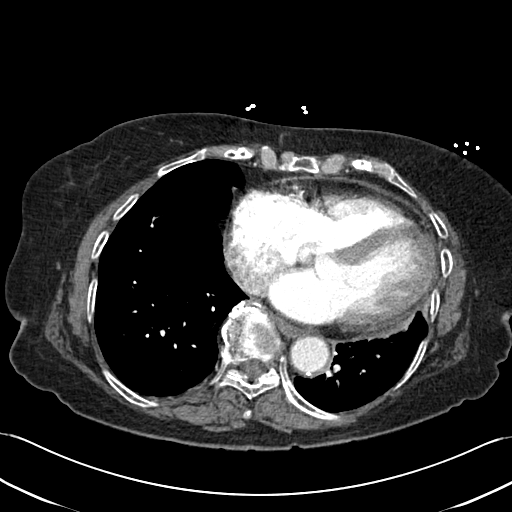
[im 100/237  lung]
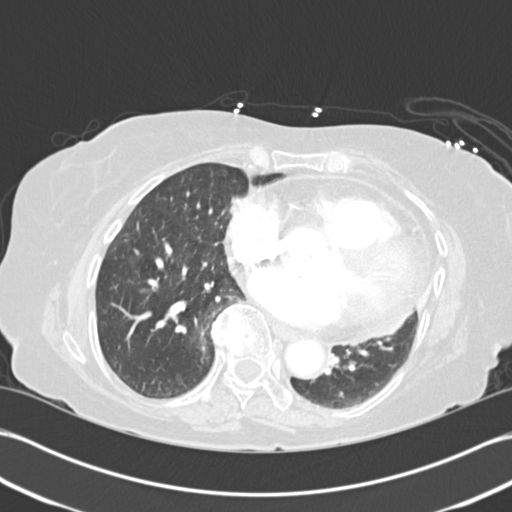
[im 125/237  soft-tissue]
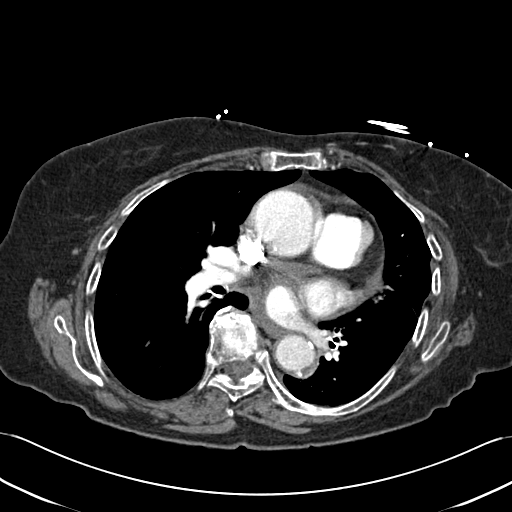
[im 137/237  lung]
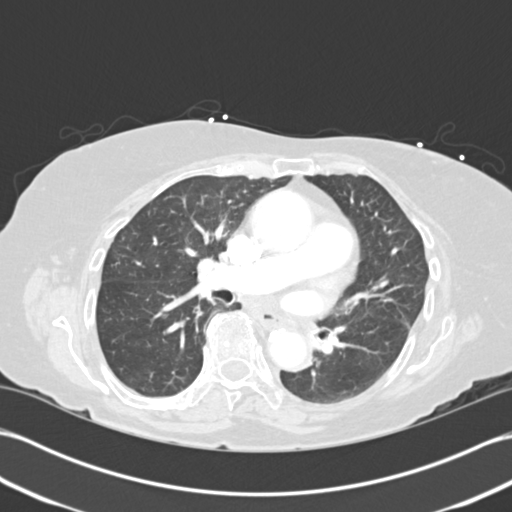
[im 150/237  soft-tissue]
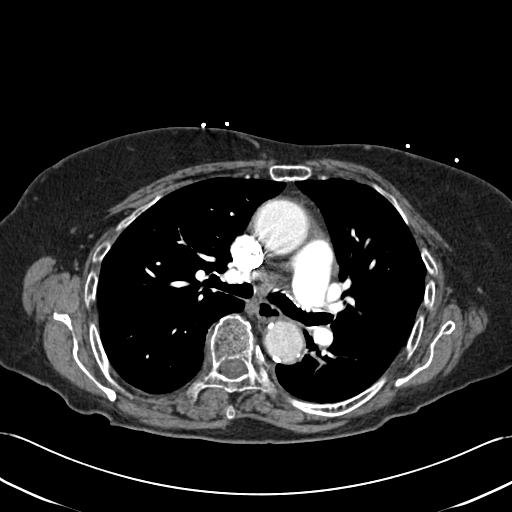
[im 162/237  lung]
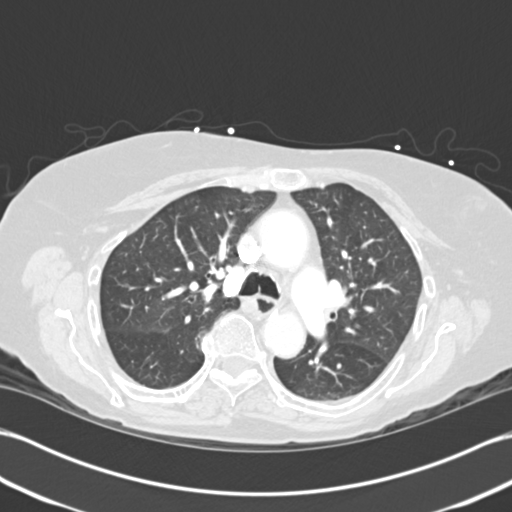
[im 174/237  soft-tissue]
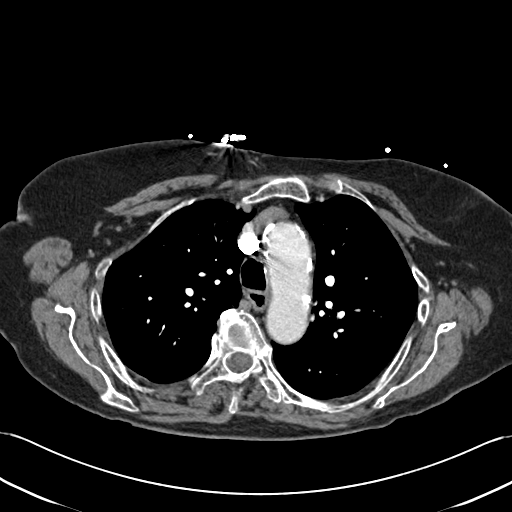
[im 199/237  lung]
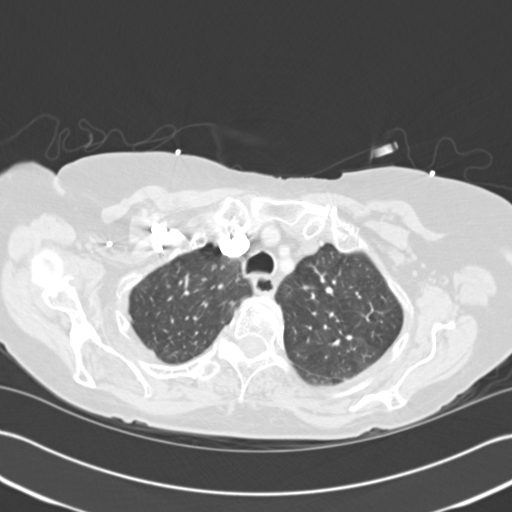
[im 212/237  soft-tissue]
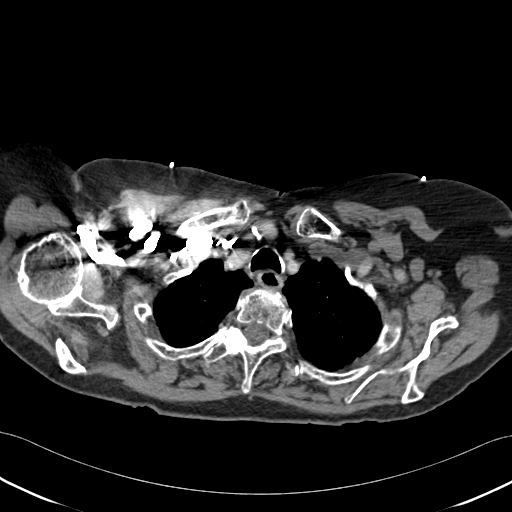
[im 224/237  lung]
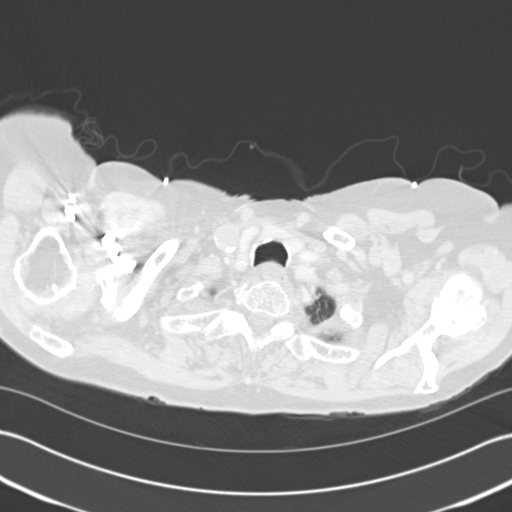

[Series 8: cor mpr 2.0 · coronal · 0.64mm/px · 3 of 100 slices shown]
[im 25/100  soft-tissue]
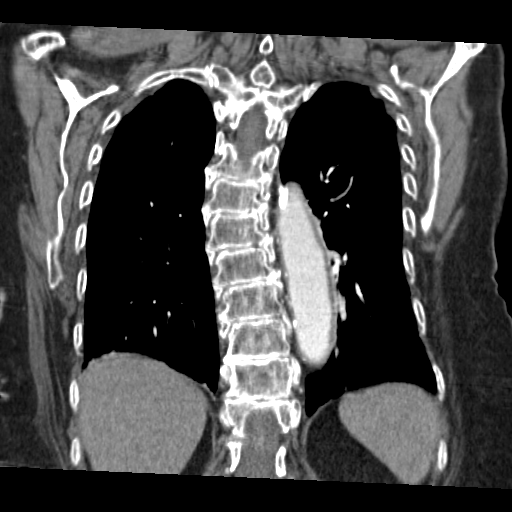
[im 50/100  soft-tissue]
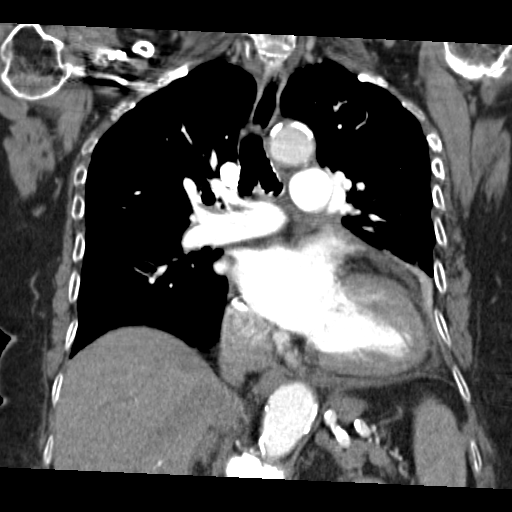
[im 75/100  soft-tissue]
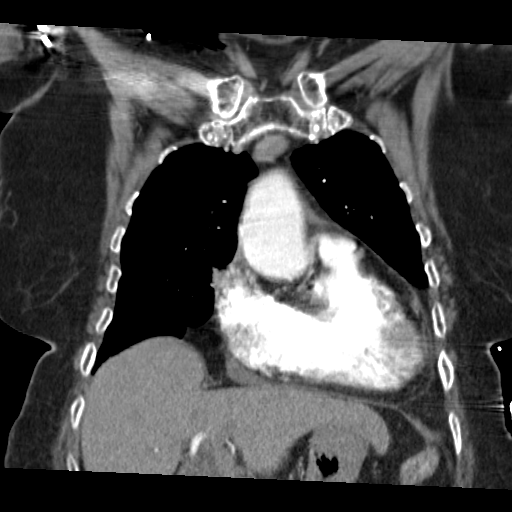

[18 of 46 positions shown; findings below may reference images not displayed]

FINDINGS: There is no evidence of aortic dissection. There is distention of
the ascending thoracic aorta to 4.4 cm in maximal diameter, though
this is within the normal range given the patient's age. Scattered
calcification is noted along the aortic arch and descending thoracic
aorta. Scattered calcification is noted at the aortic valve.

There is no evidence of pulmonary embolus.

Minimal bibasilar atelectasis is noted. Slight interstitial
prominence is noted at the lung apices. There is no evidence of
significant focal consolidation, pleural effusion or pneumothorax.
No masses are identified; no abnormal focal contrast enhancement is
seen.

The heart is mildly enlarged. Trace pericardial fluid remains within
normal limits. Visualized mediastinal nodes are grossly
unremarkable. No pericardial effusion is identified. No axillary
lymphadenopathy is seen. The visualized portions of the thyroid
gland are unremarkable in appearance.

The visualized portions of the liver and spleen are unremarkable.

No acute osseous abnormalities are seen. Degenerative change is
noted at the left glenohumeral joint. There is slight chronic loss
of height at multiple levels along the thoracic spine.

Review of the MIP images confirms the above findings.
IMPRESSION: 1. No evidence of aortic dissection. Distention of the ascending
thoracic aorta to 4.4 cm in maximal diameter, still within the
normal range given the patient's age.
2. Mild cardiomegaly. Scattered calcification along the aortic arch
and descending thoracic aorta. Scattered calcification at the aortic
valve.
3. Minimal bibasilar atelectasis noted. Slight interstitial
prominence the lung apices.
4. Degenerative change at the left glenohumeral joint.

## 2016-04-24 DIAGNOSIS — R600 Localized edema: Secondary | ICD-10-CM | POA: Diagnosis not present

## 2016-04-24 DIAGNOSIS — E871 Hypo-osmolality and hyponatremia: Secondary | ICD-10-CM | POA: Diagnosis not present

## 2016-04-24 DIAGNOSIS — S32010D Wedge compression fracture of first lumbar vertebra, subsequent encounter for fracture with routine healing: Secondary | ICD-10-CM | POA: Diagnosis not present

## 2016-04-24 DIAGNOSIS — I5033 Acute on chronic diastolic (congestive) heart failure: Secondary | ICD-10-CM | POA: Diagnosis not present

## 2016-04-24 DIAGNOSIS — I4891 Unspecified atrial fibrillation: Secondary | ICD-10-CM | POA: Diagnosis not present

## 2016-04-24 DIAGNOSIS — I6523 Occlusion and stenosis of bilateral carotid arteries: Secondary | ICD-10-CM | POA: Diagnosis not present

## 2016-04-24 DIAGNOSIS — I480 Paroxysmal atrial fibrillation: Secondary | ICD-10-CM | POA: Diagnosis not present

## 2016-04-24 DIAGNOSIS — G620 Drug-induced polyneuropathy: Secondary | ICD-10-CM | POA: Diagnosis not present

## 2016-04-24 DIAGNOSIS — R0602 Shortness of breath: Secondary | ICD-10-CM | POA: Diagnosis not present

## 2016-04-24 DIAGNOSIS — R5383 Other fatigue: Secondary | ICD-10-CM | POA: Diagnosis not present

## 2016-04-24 DIAGNOSIS — I509 Heart failure, unspecified: Secondary | ICD-10-CM | POA: Diagnosis not present

## 2016-04-24 DIAGNOSIS — M818 Other osteoporosis without current pathological fracture: Secondary | ICD-10-CM | POA: Diagnosis not present

## 2016-04-24 DIAGNOSIS — R531 Weakness: Secondary | ICD-10-CM | POA: Diagnosis not present

## 2016-04-24 DIAGNOSIS — Z952 Presence of prosthetic heart valve: Secondary | ICD-10-CM | POA: Diagnosis not present

## 2016-05-30 DIAGNOSIS — H401111 Primary open-angle glaucoma, right eye, mild stage: Secondary | ICD-10-CM | POA: Diagnosis not present

## 2016-06-08 DIAGNOSIS — R102 Pelvic and perineal pain: Secondary | ICD-10-CM | POA: Diagnosis not present

## 2016-06-08 DIAGNOSIS — M25552 Pain in left hip: Secondary | ICD-10-CM | POA: Diagnosis not present

## 2016-06-08 DIAGNOSIS — M25551 Pain in right hip: Secondary | ICD-10-CM | POA: Diagnosis not present

## 2016-07-27 DIAGNOSIS — J40 Bronchitis, not specified as acute or chronic: Secondary | ICD-10-CM | POA: Diagnosis not present

## 2016-07-27 DIAGNOSIS — I480 Paroxysmal atrial fibrillation: Secondary | ICD-10-CM | POA: Diagnosis not present

## 2016-09-02 ENCOUNTER — Emergency Department: Payer: Medicare HMO

## 2016-09-02 ENCOUNTER — Inpatient Hospital Stay
Admission: EM | Admit: 2016-09-02 | Discharge: 2016-09-05 | DRG: 605 | Disposition: A | Discharge status: Skilled Nursing Facility | Payer: Medicare HMO | Attending: Internal Medicine | Admitting: Internal Medicine

## 2016-09-02 ENCOUNTER — Encounter: Payer: Self-pay | Admitting: Radiology

## 2016-09-02 DIAGNOSIS — Z9842 Cataract extraction status, left eye: Secondary | ICD-10-CM | POA: Diagnosis not present

## 2016-09-02 DIAGNOSIS — M4802 Spinal stenosis, cervical region: Secondary | ICD-10-CM | POA: Diagnosis not present

## 2016-09-02 DIAGNOSIS — R0781 Pleurodynia: Secondary | ICD-10-CM

## 2016-09-02 DIAGNOSIS — Z952 Presence of prosthetic heart valve: Secondary | ICD-10-CM | POA: Diagnosis not present

## 2016-09-02 DIAGNOSIS — W19XXXA Unspecified fall, initial encounter: Secondary | ICD-10-CM | POA: Diagnosis not present

## 2016-09-02 DIAGNOSIS — I4891 Unspecified atrial fibrillation: Secondary | ICD-10-CM | POA: Diagnosis present

## 2016-09-02 DIAGNOSIS — M79605 Pain in left leg: Secondary | ICD-10-CM

## 2016-09-02 DIAGNOSIS — E785 Hyperlipidemia, unspecified: Secondary | ICD-10-CM | POA: Diagnosis not present

## 2016-09-02 DIAGNOSIS — Z880 Allergy status to penicillin: Secondary | ICD-10-CM | POA: Diagnosis not present

## 2016-09-02 DIAGNOSIS — W182XXA Fall in (into) shower or empty bathtub, initial encounter: Secondary | ICD-10-CM | POA: Diagnosis present

## 2016-09-02 DIAGNOSIS — I10 Essential (primary) hypertension: Secondary | ICD-10-CM | POA: Diagnosis not present

## 2016-09-02 DIAGNOSIS — H409 Unspecified glaucoma: Secondary | ICD-10-CM | POA: Diagnosis not present

## 2016-09-02 DIAGNOSIS — S0990XA Unspecified injury of head, initial encounter: Secondary | ICD-10-CM | POA: Diagnosis not present

## 2016-09-02 DIAGNOSIS — R262 Difficulty in walking, not elsewhere classified: Secondary | ICD-10-CM | POA: Diagnosis not present

## 2016-09-02 DIAGNOSIS — R51 Headache: Secondary | ICD-10-CM | POA: Diagnosis not present

## 2016-09-02 DIAGNOSIS — Z85038 Personal history of other malignant neoplasm of large intestine: Secondary | ICD-10-CM | POA: Diagnosis not present

## 2016-09-02 DIAGNOSIS — N390 Urinary tract infection, site not specified: Secondary | ICD-10-CM | POA: Diagnosis not present

## 2016-09-02 DIAGNOSIS — Z888 Allergy status to other drugs, medicaments and biological substances status: Secondary | ICD-10-CM | POA: Diagnosis not present

## 2016-09-02 DIAGNOSIS — R079 Chest pain, unspecified: Secondary | ICD-10-CM | POA: Diagnosis present

## 2016-09-02 DIAGNOSIS — S7012XA Contusion of left thigh, initial encounter: Secondary | ICD-10-CM | POA: Diagnosis not present

## 2016-09-02 DIAGNOSIS — Z7901 Long term (current) use of anticoagulants: Secondary | ICD-10-CM

## 2016-09-02 DIAGNOSIS — Z66 Do not resuscitate: Secondary | ICD-10-CM | POA: Diagnosis present

## 2016-09-02 DIAGNOSIS — Z881 Allergy status to other antibiotic agents status: Secondary | ICD-10-CM

## 2016-09-02 DIAGNOSIS — S7010XA Contusion of unspecified thigh, initial encounter: Secondary | ICD-10-CM | POA: Diagnosis present

## 2016-09-02 DIAGNOSIS — Z79899 Other long term (current) drug therapy: Secondary | ICD-10-CM | POA: Diagnosis not present

## 2016-09-02 DIAGNOSIS — S7012XD Contusion of left thigh, subsequent encounter: Secondary | ICD-10-CM | POA: Diagnosis not present

## 2016-09-02 DIAGNOSIS — G500 Trigeminal neuralgia: Secondary | ICD-10-CM | POA: Diagnosis not present

## 2016-09-02 DIAGNOSIS — Z9841 Cataract extraction status, right eye: Secondary | ICD-10-CM | POA: Diagnosis not present

## 2016-09-02 DIAGNOSIS — K219 Gastro-esophageal reflux disease without esophagitis: Secondary | ICD-10-CM | POA: Diagnosis present

## 2016-09-02 DIAGNOSIS — Z7401 Bed confinement status: Secondary | ICD-10-CM | POA: Diagnosis not present

## 2016-09-02 DIAGNOSIS — M791 Myalgia: Secondary | ICD-10-CM | POA: Diagnosis present

## 2016-09-02 DIAGNOSIS — S7290XA Unspecified fracture of unspecified femur, initial encounter for closed fracture: Secondary | ICD-10-CM | POA: Diagnosis not present

## 2016-09-02 DIAGNOSIS — W19XXXD Unspecified fall, subsequent encounter: Secondary | ICD-10-CM | POA: Diagnosis not present

## 2016-09-02 DIAGNOSIS — S8992XA Unspecified injury of left lower leg, initial encounter: Secondary | ICD-10-CM | POA: Diagnosis not present

## 2016-09-02 DIAGNOSIS — S299XXA Unspecified injury of thorax, initial encounter: Secondary | ICD-10-CM | POA: Diagnosis not present

## 2016-09-02 DIAGNOSIS — G629 Polyneuropathy, unspecified: Secondary | ICD-10-CM | POA: Diagnosis not present

## 2016-09-02 LAB — CBC WITH DIFFERENTIAL/PLATELET
BASOS ABS: 0 10*3/uL (ref 0–0.1)
BASOS PCT: 0 %
EOS ABS: 0 10*3/uL (ref 0–0.7)
EOS PCT: 1 %
HCT: 36.6 % (ref 35.0–47.0)
Hemoglobin: 12.3 g/dL (ref 12.0–16.0)
LYMPHS ABS: 0.9 10*3/uL — AB (ref 1.0–3.6)
Lymphocytes Relative: 14 %
MCH: 28.8 pg (ref 26.0–34.0)
MCHC: 33.7 g/dL (ref 32.0–36.0)
MCV: 85.3 fL (ref 80.0–100.0)
Monocytes Absolute: 0.6 10*3/uL (ref 0.2–0.9)
Monocytes Relative: 9 %
NEUTROS PCT: 76 %
Neutro Abs: 4.5 10*3/uL (ref 1.4–6.5)
PLATELETS: 151 10*3/uL (ref 150–440)
RBC: 4.29 MIL/uL (ref 3.80–5.20)
RDW: 13.1 % (ref 11.5–14.5)
WBC: 6 10*3/uL (ref 3.6–11.0)

## 2016-09-02 LAB — COMPREHENSIVE METABOLIC PANEL
ALBUMIN: 3.4 g/dL — AB (ref 3.5–5.0)
ALT: 13 U/L — ABNORMAL LOW (ref 14–54)
AST: 22 U/L (ref 15–41)
Alkaline Phosphatase: 57 U/L (ref 38–126)
Anion gap: 8 (ref 5–15)
BUN: 13 mg/dL (ref 6–20)
CHLORIDE: 95 mmol/L — AB (ref 101–111)
CO2: 29 mmol/L (ref 22–32)
Calcium: 8.5 mg/dL — ABNORMAL LOW (ref 8.9–10.3)
Creatinine, Ser: 1.01 mg/dL — ABNORMAL HIGH (ref 0.44–1.00)
GFR calc non Af Amer: 48 mL/min — ABNORMAL LOW (ref >60)
GFR, EST AFRICAN AMERICAN: 56 mL/min — AB (ref >60)
Glucose, Bld: 153 mg/dL — ABNORMAL HIGH (ref 65–99)
Potassium: 3.6 mmol/L (ref 3.5–5.1)
SODIUM: 132 mmol/L — AB (ref 135–145)
Total Bilirubin: 0.9 mg/dL (ref 0.3–1.2)
Total Protein: 6.4 g/dL — ABNORMAL LOW (ref 6.5–8.1)

## 2016-09-02 LAB — HEMOGLOBIN: Hemoglobin: 11.8 g/dL — ABNORMAL LOW (ref 12.0–16.0)

## 2016-09-02 MED ORDER — SODIUM CHLORIDE 0.9 % IV SOLN: 250.0000 mL | INTRAVENOUS | Status: DC | PRN

## 2016-09-02 MED ORDER — FENTANYL CITRATE (PF) 100 MCG/2ML IJ SOLN
25.0000 ug | INTRAMUSCULAR | Status: DC | PRN
  Administered 2016-09-02: 25 ug via INTRAVENOUS

## 2016-09-02 MED ORDER — TIMOLOL MALEATE 0.5 % OP SOLN
1.0000 [drp] | Freq: Two times a day (BID) | OPHTHALMIC | Status: DC
  Administered 2016-09-02 – 2016-09-05 (×5): 1 [drp] via OPHTHALMIC
  Filled 2016-09-02: qty 5

## 2016-09-02 MED ORDER — TRAMADOL HCL 50 MG PO TABS
ORAL_TABLET | ORAL | Status: AC
  Administered 2016-09-02: 50 mg via ORAL
  Filled 2016-09-02: qty 1

## 2016-09-02 MED ORDER — PANTOPRAZOLE SODIUM 40 MG PO TBEC
40.0000 mg | DELAYED_RELEASE_TABLET | Freq: Every day | ORAL | Status: DC
  Administered 2016-09-03 – 2016-09-05 (×3): 40 mg via ORAL
  Filled 2016-09-02 (×3): qty 1

## 2016-09-02 MED ORDER — SODIUM CHLORIDE 0.9% FLUSH
3.0000 mL | Freq: Two times a day (BID) | INTRAVENOUS | Status: DC
  Administered 2016-09-03 – 2016-09-05 (×4): 3 mL via INTRAVENOUS

## 2016-09-02 MED ORDER — FENTANYL CITRATE (PF) 100 MCG/2ML IJ SOLN
INTRAMUSCULAR | Status: AC
  Administered 2016-09-02: 25 ug via INTRAVENOUS
  Filled 2016-09-02: qty 2

## 2016-09-02 MED ORDER — TRAMADOL HCL 50 MG PO TABS
50.0000 mg | ORAL_TABLET | Freq: Four times a day (QID) | ORAL | Status: DC | PRN
  Administered 2016-09-02 – 2016-09-04 (×4): 50 mg via ORAL
  Filled 2016-09-02 (×4): qty 1

## 2016-09-02 MED ORDER — AMIODARONE HCL 200 MG PO TABS
200.0000 mg | ORAL_TABLET | Freq: Every day | ORAL | Status: DC
  Administered 2016-09-03 – 2016-09-05 (×3): 200 mg via ORAL
  Filled 2016-09-02 (×3): qty 1

## 2016-09-02 MED ORDER — LATANOPROST 0.005 % OP SOLN
1.0000 [drp] | Freq: Every day | OPHTHALMIC | Status: DC
  Administered 2016-09-02 – 2016-09-04 (×3): 1 [drp] via OPHTHALMIC
  Filled 2016-09-02: qty 2.5

## 2016-09-02 MED ORDER — ONDANSETRON HCL 4 MG/2ML IJ SOLN: 4.0000 mg | Freq: Four times a day (QID) | INTRAMUSCULAR | Status: DC | PRN

## 2016-09-02 MED ORDER — IOPAMIDOL (ISOVUE-300) INJECTION 61%
75.0000 mL | Freq: Once | INTRAVENOUS | Status: AC | PRN
  Administered 2016-09-02: 75 mL via INTRAVENOUS

## 2016-09-02 MED ORDER — SIMVASTATIN 20 MG PO TABS
20.0000 mg | ORAL_TABLET | Freq: Every day | ORAL | Status: DC
  Administered 2016-09-02 – 2016-09-04 (×3): 20 mg via ORAL
  Filled 2016-09-02 (×3): qty 1

## 2016-09-02 MED ORDER — POTASSIUM CHLORIDE 20 MEQ PO PACK
20.0000 meq | PACK | Freq: Two times a day (BID) | ORAL | Status: DC
  Administered 2016-09-02: 20 meq via ORAL
  Filled 2016-09-02 (×2): qty 1

## 2016-09-02 MED ORDER — MELOXICAM 7.5 MG PO TABS
7.5000 mg | ORAL_TABLET | Freq: Every day | ORAL | Status: DC
  Administered 2016-09-03 – 2016-09-05 (×3): 7.5 mg via ORAL
  Filled 2016-09-02 (×3): qty 1

## 2016-09-02 MED ORDER — MORPHINE SULFATE (PF) 2 MG/ML IV SOLN: 2.0000 mg | INTRAVENOUS | Status: DC | PRN

## 2016-09-02 MED ORDER — HYDRALAZINE HCL 20 MG/ML IJ SOLN
10.0000 mg | Freq: Four times a day (QID) | INTRAMUSCULAR | Status: DC | PRN
  Administered 2016-09-02: 10 mg via INTRAVENOUS

## 2016-09-02 MED ORDER — ONDANSETRON HCL 4 MG PO TABS: 4.0000 mg | ORAL_TABLET | Freq: Four times a day (QID) | ORAL | Status: DC | PRN

## 2016-09-02 MED ORDER — TRAMADOL HCL 50 MG PO TABS
50.0000 mg | ORAL_TABLET | Freq: Once | ORAL | Status: AC
  Administered 2016-09-02: 50 mg via ORAL

## 2016-09-02 MED ORDER — FUROSEMIDE 20 MG PO TABS
40.0000 mg | ORAL_TABLET | Freq: Two times a day (BID) | ORAL | Status: DC
  Filled 2016-09-02: qty 2

## 2016-09-02 MED ORDER — SODIUM CHLORIDE 0.9% FLUSH: 3.0000 mL | INTRAVENOUS | Status: DC | PRN

## 2016-09-02 MED ORDER — CALCIUM CARBONATE-VITAMIN D 500-200 MG-UNIT PO TABS
1.0000 | ORAL_TABLET | Freq: Every day | ORAL | Status: DC
  Filled 2016-09-02 (×3): qty 1

## 2016-09-02 MED ORDER — SODIUM CHLORIDE 1 G PO TABS
1.0000 g | ORAL_TABLET | Freq: Two times a day (BID) | ORAL | Status: DC
Start: 2016-09-03 — End: 2016-09-05
  Administered 2016-09-03 – 2016-09-05 (×5): 1 g via ORAL
  Filled 2016-09-02 (×6): qty 1

## 2016-09-02 MED ORDER — HYDRALAZINE HCL 20 MG/ML IJ SOLN
INTRAMUSCULAR | Status: AC
  Administered 2016-09-02: 10 mg via INTRAVENOUS
  Filled 2016-09-02: qty 1

## 2016-09-02 NOTE — ED Notes (Signed)
Pt's o2sat noted to drop to 90-91% on RA after fentanyl administration, pt placed on oxygen 2L via [place], now 98%

## 2016-09-02 NOTE — ED Notes (Signed)
Pt transported to CT by Kat, CT tech 

## 2016-09-02 NOTE — ED Notes (Signed)
IV insertion attempted x 2 by this RN.  Elenore Rota RN at bedside attempting IV

## 2016-09-02 NOTE — ED Triage Notes (Signed)
Pt states that during the night she tripped over her shower chair that is partially in and out of the tub,. Pt reports that she was able to get herself out of the floor and used her walker to get back to her room, pt then was able to get herself to her reclining lift chair but states after being in it awhile she was having a lot of pain and was unable at that time to get herself up, pt is hurting in her left femur with a large bruise on her left thigh, pt also c/o some soreness to her bilat sides, states that she thinks it was from using those muscles to get herself up

## 2016-09-02 NOTE — Progress Notes (Addendum)
Advanced care plan.  Purpose of the Encounter: CODE STATUS  Parties in Attendance: patient   Patient's Decision Capacity: Intact  Subjective/Patient's story: Patient is a 83 year old with history of atrial fibrillation who is coming to the hospital after a fall and has a hematoma   Objective/Medical story I discussed with the patient regarding her CODE STATUS and CPR and intubation she states that she would not want to be resuscitated   Goals of care determination: DO NOT RESUSCITATE    CODE STATUS: DO NOT RESUSCITATE   Time spent discussing advanced care planning: 15 minutes

## 2016-09-02 NOTE — ED Notes (Signed)
Pt back from x-ray.

## 2016-09-02 NOTE — ED Notes (Signed)
Pt taken to CT.

## 2016-09-02 NOTE — ED Provider Notes (Signed)
[facility] Emergency Department Provider Note    First MD Initiated Contact with Patient 09/02/16 1615     (approximate)  I have reviewed the triage vital signs and the nursing notes.   HISTORY  Chief Complaint Fall    HPI Caroline Aguilar is a 83 y.o. female with a history of A. fib and status post TAVR on [place] presents with complaint of left thigh pain and inability to walk after mechanical fall last night. Patient tripped on a shower chair and fell into bathtub onto her left side. Struggled to get up. Was able to bear weight on left leg. Was able to get into bed. Due to pain and inability to ambulate patient did call ambulance for further evaluation. Denies any chest pain or shortness of breath. States that when [place] the pain is located in the left thigh. States that she may have hit her head and does have some neck soreness as well. No numbness or tingling.   Past Medical History:  Diagnosis Date  . A-fib ([place])   . Angina pectoris ([place])   . Cancer [facility])    Colon cancer  . Hyperlipidemia    Family History  Problem Relation Age of Onset  . Cancer Mother   . Heart failure Father    Past Surgical History:  Procedure Laterality Date  . CARDIAC CATHETERIZATION N/A 07/19/2015   Procedure: Right and Left Heart Cath;  Surgeon: Isaias Cowman, MD;  Location: [place] [facility];  Service: Cardiovascular;  Laterality: N/A;  . CATARACT EXTRACTION Bilateral   . COLON SURGERY    . ESOPHAGOGASTRODUODENOSCOPY N/A 07/08/2015   Procedure: ESOPHAGOGASTRODUODENOSCOPY (EGD);  Surgeon: Hulen Luster, MD;  Location: [facility] ENDOSCOPY;  Service: Endoscopy;  Laterality: N/A;  . HIP SURGERY Bilateral    Patient Active Problem List   Diagnosis Date Noted  . Thigh hematoma 09/02/2016  . Dyspnea 07/15/2015  . Chest pain at rest 07/15/2015  . Community acquired pneumonia 07/02/2015  . Hyponatremia 07/02/2015  . Atrial fibrillation with rapid ventricular  response ([place]) 07/02/2015      Prior to Admission medications   Medication Sig Start Date End Date Taking? Authorizing Provider  amiodarone (PACERONE) 200 MG tablet Take 1 tablet (200 mg total) by mouth daily. Patient taking differently: Take 100 mg by mouth 2 (two) times daily.  07/20/15  Yes Wieting, Richard, MD  apixaban (ELIQUIS) 2.5 MG TABS tablet Take 1 tablet (2.5 mg total) by mouth 2 (two) times daily. 07/09/15  Yes Epifanio Lesches, MD  Calcium Carbonate-Vitamin D (CALCIUM 600+D) 600-200 MG-UNIT TABS Take 1 tablet by mouth daily.   Yes [provider]  docusate sodium (COLACE) 100 MG capsule Take 100 mg by mouth daily. Reported on 08/05/2015   Yes [provider]  latanoprost (XALATAN) 0.005 % ophthalmic solution Place 1 drop into both eyes at bedtime.   Yes [provider]  meloxicam (MOBIC) 7.5 MG tablet Take 7.5 mg by mouth daily as needed. 06/08/16  Yes [provider]  pantoprazole (PROTONIX) 40 MG tablet Take 40 mg by mouth daily.   Yes [provider]  potassium chloride (KLOR-CON) 20 MEQ packet Take 20 mEq by mouth 2 (two) times daily. Patient taking differently: Take 20 mEq by mouth daily.  07/20/15  Yes Wieting, Richard, MD  simvastatin (ZOCOR) 20 MG tablet Take 20 mg by mouth at bedtime.   Yes [provider]  timolol (TIMOPTIC) 0.5 % ophthalmic solution Place 1 drop into both  eyes 2 (two) times daily.   Yes [provider]  traMADol (ULTRAM) 50 MG tablet Take 50 mg by mouth as needed. 06/08/16  Yes [provider]  feeding supplement, ENSURE ENLIVE, (ENSURE ENLIVE) LIQD Take 237 mLs by mouth 2 (two) times daily between meals. 07/09/15   Epifanio Lesches, MD  furosemide (LASIX) 40 MG tablet Take 1 tablet (40 mg total) by mouth 2 (two) times daily. Patient not taking: Reported on 09/02/2016 07/20/15   [name], MD  sodium chloride 1 g tablet Take 1 tablet (1 g total) by mouth 2 (two) times daily with  a meal. Patient not taking: Reported on 09/02/2016 07/09/15   Epifanio Lesches, MD    Allergies Penicillins; Azithromycin; and Famciclovir    Social History Social History  Substance Use Topics  . Smoking status: Never Smoker  . Smokeless tobacco: Not on file  . Alcohol use No    Review of Systems Patient denies headaches, rhinorrhea, blurry vision, numbness, shortness of breath, chest pain, edema, cough, abdominal pain, nausea, vomiting, diarrhea, dysuria, fevers, rashes or hallucinations unless otherwise stated above in HPI. ____________________________________________   PHYSICAL EXAM:  VITAL SIGNS: Vitals:   09/02/16 2130 09/02/16 2220  BP: (!) 165/64 (!) 116/43  Pulse: 91 77  Resp: 20 18  Temp:  98.2 F (36.8 C)    Constitutional: Alert and oriented. Well appearing and in no acute distress. Eyes: Conjunctivae are normal. PERRL. EOMI. Head: Atraumatic. Nose: No congestion/rhinnorhea. Mouth/Throat: Mucous membranes are moist.  Oropharynx non-erythematous. Neck: No stridor. Painless ROM. + paracervical spine tenderness to palpation, no step offs or deformities Hematological/Lymphatic/Immunilogical: No cervical lymphadenopathy. Cardiovascular: Normal rate, regular rhythm. Grossly normal heart sounds.  Good peripheral circulation. Respiratory: Normal respiratory effort.  No retractions. Lungs CTAB. Gastrointestinal: Soft and nontender. No distention. No abdominal bruits. No CVA tenderness. Musculoskeletal: left thigh ttp with evidence of ecchymosis.  No leg shortening.  + pain with log roll to the left. Neurologic:  Normal speech and language. No gross focal neurologic deficits are appreciated. No gait instability. Skin:  Skin is warm, dry and intact. No rash noted. Psychiatric: Mood and affect are normal. Speech and behavior are normal. ____________________________________________   LABS (all labs ordered are listed, but only abnormal results are  displayed)  Results for orders placed or performed during the hospital encounter of 09/02/16 (from the past 24 hour(s))  CBC with Differential/Platelet     Status: Abnormal   Collection Time: 09/02/16  4:34 PM  Result Value Ref Range   WBC 6.0 3.6 - 11.0 K/uL   RBC 4.29 3.80 - 5.20 MIL/uL   Hemoglobin 12.3 12.0 - 16.0 g/dL   HCT 36.6 35.0 - 47.0 %   MCV 85.3 80.0 - 100.0 fL   MCH 28.8 26.0 - 34.0 pg   MCHC 33.7 32.0 - 36.0 g/dL   RDW 13.1 11.5 - 14.5 %   Platelets 151 150 - 440 K/uL   Neutrophils Relative % 76 %   Neutro Abs 4.5 1.4 - 6.5 K/uL   Lymphocytes Relative 14 %   Lymphs Abs 0.9 (L) 1.0 - 3.6 K/uL   Monocytes Relative 9 %   Monocytes Absolute 0.6 0.2 - 0.9 K/uL   Eosinophils Relative 1 %   Eosinophils Absolute 0.0 0 - 0.7 K/uL   Basophils Relative 0 %   Basophils Absolute 0.0 0 - 0.1 K/uL  Comprehensive metabolic panel     Status: Abnormal   Collection Time: 09/02/16  4:34 PM  Result  Value Ref Range   Sodium 132 (L) 135 - 145 mmol/L   Potassium 3.6 3.5 - 5.1 mmol/L   Chloride 95 (L) 101 - 111 mmol/L   CO2 29 22 - 32 mmol/L   Glucose, Bld 153 (H) 65 - 99 mg/dL   BUN 13 6 - 20 mg/dL   Creatinine, Ser 1.01 (H) 0.44 - 1.00 mg/dL   Calcium 8.5 (L) 8.9 - 10.3 mg/dL   Total Protein 6.4 (L) 6.5 - 8.1 g/dL   Albumin 3.4 (L) 3.5 - 5.0 g/dL   AST 22 15 - 41 U/L   ALT 13 (L) 14 - 54 U/L   Alkaline Phosphatase 57 38 - 126 U/L   Total Bilirubin 0.9 0.3 - 1.2 mg/dL   GFR calc non Af Amer 48 (L) >60 mL/min   GFR calc Af Amer 56 (L) >60 mL/min   Anion gap 8 5 - 15  Hemoglobin     Status: Abnormal   Collection Time: 09/02/16  9:07 PM  Result Value Ref Range   Hemoglobin 11.8 (L) 12.0 - 16.0 g/dL   ____________________________________________  EKG My review and personal interpretation at Time: 16:34   Indication: fall  Rate: 60  Rhythm: sinus Axis: normal Other: normal intervals, no st elevations or depressions ____________________________________________  RADIOLOGY I  personally reviewed all radiographic images ordered to evaluate for the above acute complaints and reviewed radiology reports and findings.  These findings were personally discussed with the patient.  Please see medical record for radiology report.  ____________________________________________   PROCEDURES  Procedure(s) performed:  Procedures    Critical Care performed: no ____________________________________________   INITIAL IMPRESSION / ASSESSMENT AND PLAN / ED COURSE  Pertinent labs & imaging results that were available during my care of the patient were reviewed by me and considered in my medical decision making (see chart for details).  DDX: fracture, contusion, hematoma, dislocation  Laveda P Shilling is a 83 y.o. who presents to the ED with pain in the left thigh as described above. Given her fall patient will be evaluated with CT imaging of the head and neck and addition to x-ray imaging of the left thigh and pelvis and chest. Otherwise he'll dynamically stable. Her abdominal exam is soft and benign.  The patient will be placed on continuous pulse oximetry and telemetry for monitoring.  Laboratory evaluation will be sent to evaluate for the above complaints.     Clinical Course as of Sep 02 2332  Sat Sep 02, 2016  1807 Preliminary imaging shows no evidence of traumatic injury, however based on her tenderness and firmness to the left thigh will order CT imaging to evaluate for evidence of active hemorrhage.  [PR]  2030 Spoke with Dr. Daine Gip of Ortho regarding ct findings.  In the setting of her being on close, will have patient placed in compression wrap. No indication for emergent surgical intervention. This is not consistent with compartment syndrome but I do feel the patient benefit from observation in the hospital for serial exams and repeat hemoglobin. Patient also required multiple doses of IV pain medication for pain control.  Have discussed with the patient and available  family all diagnostics and treatments performed thus far and all questions were answered to the best of my ability. The patient demonstrates understanding and agreement with plan.   [PR]    Clinical Course User Index [PR] Merlyn Lot, MD     ____________________________________________   FINAL CLINICAL IMPRESSION(S) / ED DIAGNOSES  Final diagnoses:  Leg pain, diffuse, left  Thigh hematoma, left, initial encounter  Fall, initial encounter  Chronic anticoagulation      NEW MEDICATIONS STARTED DURING THIS VISIT:  Current Discharge Medication List       Note:  This document was prepared using Dragon voice recognition software and may include unintentional dictation errors.    Merlyn Lot, MD 09/02/16 [phone]

## 2016-09-02 NOTE — H&P (Signed)
[facility] NAME: Lauriel Helin    MR#:  [id number]  DATE OF BIRTH:  04-27-1925  DATE OF ADMISSION:  09/02/2016  PRIMARY CARE PHYSICIAN: [name], MD   REQUESTING/REFERRING PHYSICIAN: Robinsson MD  CHIEF COMPLAINT:   Chief Complaint  Patient presents with  . Fall    HISTORY OF PRESENT ILLNESS: Jenel Gierke  is a 83 y.o. female with a known history of Atrial fibrillation, colon cancer, hyperlipidemia, status post TAVR on chronic elliquis therapy who presents after a fall. Patient reports that she lives by herself and has had recurrent falls. Patient tripped on a shower chair and fell into the bathtub onto her left side. Patient underwent a CT scan of her left femur and was noted to have large intramuscular hematoma with small focus of active hemorrhage. The ER physician discussed the case with orthopedics who recommend compression of the lower extremity. Patient hemodynamically is stable. Hemoglobin is currently stable. She otherwise denies any chest pain or shortness of breath PAST MEDICAL HISTORY:   Past Medical History:  Diagnosis Date  . A-fib ([place])   . Angina pectoris ([place])   . Cancer [facility])    Colon cancer  . Hyperlipidemia     PAST SURGICAL HISTORY: Past Surgical History:  Procedure Laterality Date  . CARDIAC CATHETERIZATION N/A 07/19/2015   Procedure: Right and Left Heart Cath;  Surgeon: Isaias Cowman, MD;  Location: [facility] CV LAB;  Service: Cardiovascular;  Laterality: N/A;  . CATARACT EXTRACTION Bilateral   . COLON SURGERY    . ESOPHAGOGASTRODUODENOSCOPY N/A 07/08/2015   Procedure: ESOPHAGOGASTRODUODENOSCOPY (EGD);  Surgeon: Hulen Luster, MD;  Location: [facility] ENDOSCOPY;  Service: Endoscopy;  Laterality: N/A;  . HIP SURGERY Bilateral     SOCIAL HISTORY:  Social History  Substance Use Topics  . Smoking status: Never Smoker  . Smokeless tobacco: Not on file  . Alcohol use No    FAMILY HISTORY:   Family History  Problem Relation Age of Onset  . Cancer Mother   . Heart failure Father     DRUG ALLERGIES:  Allergies  Allergen Reactions  . Penicillins Anaphylaxis and Other (See Comments)    Has patient had a PCN reaction causing immediate rash, facial/tongue/throat swelling, SOB or lightheadedness with hypotension: Yes Has patient had a PCN reaction causing severe rash involving mucus membranes or skin necrosis: No Has patient had a PCN reaction that required hospitalization No Has patient had a PCN reaction occurring within the last 10 years: No If all of the above answers are "NO", then may proceed with Cephalosporin use.  . Azithromycin Other (See Comments)    Reaction:  Unknown   . Famciclovir Other (See Comments)    Reaction:  Unknown     REVIEW OF SYSTEMS:   CONSTITUTIONAL: No fever, fatigue or weakness.  EYES: No blurred or double vision.  EARS, NOSE, AND THROAT: No tinnitus or ear pain.  RESPIRATORY: No cough, shortness of breath, wheezing or hemoptysis.  CARDIOVASCULAR: No chest pain, orthopnea, edema.  GASTROINTESTINAL: No nausea, vomiting, diarrhea or abdominal pain.  GENITOURINARY: No dysuria, hematuria.  ENDOCRINE: No polyuria, nocturia,  HEMATOLOGY: No anemia, easy bruising or bleeding SKIN: No rash or lesion. MUSCULOSKELETAL: Pain in the left thigh  NEUROLOGIC: No tingling, numbness, weakness.  PSYCHIATRY: No anxiety or depression.   MEDICATIONS AT HOME:  Prior to Admission medications   Medication Sig Start Date End Date Taking? Authorizing Provider  amiodarone (PACERONE) 200 MG  tablet Take 1 tablet (200 mg total) by mouth daily. Patient taking differently: Take 100 mg by mouth 2 (two) times daily.  07/20/15  Yes Wieting, Richard, MD  apixaban (ELIQUIS) 2.5 MG TABS tablet Take 1 tablet (2.5 mg total) by mouth 2 (two) times daily. 07/09/15  Yes Epifanio Lesches, MD  Calcium Carbonate-Vitamin D (CALCIUM 600+D) 600-200 MG-UNIT TABS Take 1 tablet by mouth  daily.   Yes [provider]  docusate sodium (COLACE) 100 MG capsule Take 100 mg by mouth daily. Reported on 08/05/2015   Yes [provider]  latanoprost (XALATAN) 0.005 % ophthalmic solution Place 1 drop into both eyes at bedtime.   Yes [provider]  meloxicam (MOBIC) 7.5 MG tablet Take 7.5 mg by mouth daily as needed. 06/08/16  Yes [provider]  pantoprazole (PROTONIX) 40 MG tablet Take 40 mg by mouth daily.   Yes [provider]  potassium chloride (KLOR-CON) 20 MEQ packet Take 20 mEq by mouth 2 (two) times daily. Patient taking differently: Take 20 mEq by mouth daily.  07/20/15  Yes Wieting, Richard, MD  simvastatin (ZOCOR) 20 MG tablet Take 20 mg by mouth at bedtime.   Yes [provider]  timolol (TIMOPTIC) 0.5 % ophthalmic solution Place 1 drop into both eyes 2 (two) times daily.   Yes [provider]  traMADol (ULTRAM) 50 MG tablet Take 50 mg by mouth as needed. 06/08/16  Yes [provider]  feeding supplement, ENSURE ENLIVE, (ENSURE ENLIVE) LIQD Take 237 mLs by mouth 2 (two) times daily between meals. 07/09/15   Epifanio Lesches, MD  furosemide (LASIX) 40 MG tablet Take 1 tablet (40 mg total) by mouth 2 (two) times daily. Patient not taking: Reported on 09/02/2016 07/20/15   [name], MD  sodium chloride 1 g tablet Take 1 tablet (1 g total) by mouth 2 (two) times daily with a meal. Patient not taking: Reported on 09/02/2016 07/09/15   Epifanio Lesches, MD      PHYSICAL EXAMINATION:   VITAL SIGNS: Blood pressure (!) 206/87, pulse 68, temperature 97.9 F (36.6 C), temperature source Oral, resp. rate 18, height 5\' 5"  (1.651 m), weight 161 lb (73 kg), SpO2 100 %.  GENERAL:  83 y.o.-year-old patient lying in the bed with no acute distress.  EYES: Pupils equal, round, reactive to light and accommodation. No scleral icterus. Extraocular muscles intact.  HEENT: Head atraumatic, normocephalic. Oropharynx  and nasopharynx clear.  NECK:  Supple, no jugular venous distention. No thyroid enlargement, no tenderness.  LUNGS: Normal breath sounds bilaterally, no wheezing, rales,rhonchi or crepitation. No use of accessory muscles of respiration.  CARDIOVASCULAR: S1, S2 normal. No murmurs, rubs, or gallops.  ABDOMEN: Soft, nontender, nondistended. Bowel sounds present. No organomegaly or mass.  EXTREMITIES: No pedal edema, cyanosis, or clubbing. Left thigh is swollen NEUROLOGIC: Cranial nerves II through XII are intact. Muscle strength 5/5 in all extremities. Sensation intact. Gait not checked.  PSYCHIATRIC: [name].  SKIN: No obvious rash, lesion, or ulcer.   LABORATORY PANEL:   CBC  Recent Labs Lab 09/02/16 1634  WBC 6.0  HGB 12.3  HCT 36.6  PLT 151  MCV 85.3  MCH 28.8  MCHC 33.7  RDW 13.1  LYMPHSABS 0.9*  MONOABS 0.6  EOSABS 0.0  BASOSABS 0.0   ------------------------------------------------------------------------------------------------------------------  Chemistries   Recent Labs Lab 09/02/16 1634  NA 132*  K 3.6  CL 95*  CO2 29  GLUCOSE 153*  BUN 13  CREATININE 1.01*  CALCIUM 8.5*  AST 22  ALT 13*  ALKPHOS 57  BILITOT 0.9   ------------------------------------------------------------------------------------------------------------------ estimated creatinine clearance is 38.5 mL/min (A) (by C-G formula based on SCr of 1.01 mg/dL (H)). ------------------------------------------------------------------------------------------------------------------ No results for input(s): TSH, T4TOTAL, T3FREE, THYROIDAB in the last 72 hours.  Invalid input(s): FREET3   Coagulation profile No results for input(s): INR, PROTIME in the last 168 hours. ------------------------------------------------------------------------------------------------------------------- No results for input(s): DDIMER in the last 72  hours. -------------------------------------------------------------------------------------------------------------------  Cardiac Enzymes No results for input(s): CKMB, TROPONINI, MYOGLOBIN in the last 168 hours.  Invalid input(s): CK ------------------------------------------------------------------------------------------------------------------ Invalid input(s): POCBNP  ---------------------------------------------------------------------------------------------------------------  Urinalysis    Component Value Date/Time   COLORURINE AMBER (A) 07/02/2015 1824   APPEARANCEUR HAZY (A) 07/02/2015 1824   LABSPEC 1.020 07/02/2015 1824   PHURINE 5.0 07/02/2015 1824   GLUCOSEU NEGATIVE 07/02/2015 1824   HGBUR 3+ (A) 07/02/2015 1824   BILIRUBINUR NEGATIVE 07/02/2015 1824   KETONESUR NEGATIVE 07/02/2015 1824   PROTEINUR 30 (A) 07/02/2015 1824   NITRITE POSITIVE (A) 07/02/2015 1824   LEUKOCYTESUR 1+ (A) 07/02/2015 1824     RADIOLOGY: Dg Chest 2 View  Result Date: 09/02/2016 CLINICAL DATA:  Recent fall with left chest pain, initial encounter EXAM: CHEST  2 VIEW COMPARISON:  07/14/2015 FINDINGS: Cardiac shadow is at the upper limits of normal in size. There is been interval TAVR procedure. Aortic calcifications are noted. No focal infiltrate or sizable effusion is seen. No pneumothorax is noted. Previously seen left pleural effusion has resolved. No acute bony abnormality is seen. IMPRESSION: No acute abnormality noted. Electronically Signed   By: Inez Catalina M.D.   On: 09/02/2016 17:51   Dg Pelvis 1-2 Views  Result Date: 09/02/2016 CLINICAL DATA:  Fall yesterday with pelvic pain, initial encounter EXAM: PELVIS - 1-2 VIEW COMPARISON:  07/26/2014 FINDINGS: Changes consistent with prior bilateral femoral fractures with fixation are seen. The pelvic ring is intact. Degenerative changes of the lumbar spine are noted. No soft tissue abnormality is seen. IMPRESSION: Prior femoral fractures  with fixation.  No acute abnormality noted. Electronically Signed   By: Inez Catalina M.D.   On: 09/02/2016 17:49   Ct Head Wo Contrast  Result Date: 09/02/2016 CLINICAL DATA:  Recent fall with headaches and neck pain, initial encounter EXAM: CT HEAD WITHOUT CONTRAST CT CERVICAL SPINE WITHOUT CONTRAST TECHNIQUE: Multidetector CT imaging of the head and cervical spine was performed following the standard protocol without intravenous contrast. Multiplanar CT image reconstructions of the cervical spine were also generated. COMPARISON:  10/28/2015 FINDINGS: CT HEAD FINDINGS Brain: No evidence of acute infarction, hemorrhage, hydrocephalus, extra-axial collection or mass lesion/mass effect.Mild atrophic changes are noted. Vascular: No hyperdense vessel or unexpected calcification. Skull: Normal. Negative for fracture or focal lesion. Sinuses/Orbits: No acute finding. Other: None. CT CERVICAL SPINE FINDINGS Alignment: Mild loss of the normal cervical lordosis is noted. Skull base and vertebrae: 7 cervical segments are well visualized. Vertebral body height is well maintained. Disc space narrowing is noted at C5-6 and C6-7 with associated osteophytic changes. Multilevel facet hypertrophic changes are seen. No acute fracture or acute facet abnormality is noted. The odontoid is within normal limits. Soft tissues and spinal canal: Within normal limits. Disc levels: Disc space narrowing at C5-6 and C6-7 as described above. Upper chest: Within normal limits. IMPRESSION: CT of the head: Atrophic changes without acute abnormality. CT of the cervical spine: Multilevel degenerative change without acute abnormality. Electronically Signed   By: Linus Mako.D.  On: 09/02/2016 17:43   Ct Cervical Spine Wo Contrast  Result Date: 09/02/2016 CLINICAL DATA:  Recent fall with headaches and neck pain, initial encounter EXAM: CT HEAD WITHOUT CONTRAST CT CERVICAL SPINE WITHOUT CONTRAST TECHNIQUE: Multidetector CT imaging of the head  and cervical spine was performed following the standard protocol without intravenous contrast. Multiplanar CT image reconstructions of the cervical spine were also generated. COMPARISON:  10/28/2015 FINDINGS: CT HEAD FINDINGS Brain: No evidence of acute infarction, hemorrhage, hydrocephalus, extra-axial collection or mass lesion/mass effect.Mild atrophic changes are noted. Vascular: No hyperdense vessel or unexpected calcification. Skull: Normal. Negative for fracture or focal lesion. Sinuses/Orbits: No acute finding. Other: None. CT CERVICAL SPINE FINDINGS Alignment: Mild loss of the normal cervical lordosis is noted. Skull base and vertebrae: 7 cervical segments are well visualized. Vertebral body height is well maintained. Disc space narrowing is noted at C5-6 and C6-7 with associated osteophytic changes. Multilevel facet hypertrophic changes are seen. No acute fracture or acute facet abnormality is noted. The odontoid is within normal limits. Soft tissues and spinal canal: Within normal limits. Disc levels: Disc space narrowing at C5-6 and C6-7 as described above. Upper chest: Within normal limits. IMPRESSION: CT of the head: Atrophic changes without acute abnormality. CT of the cervical spine: Multilevel degenerative change without acute abnormality. Electronically Signed   By: Inez Catalina M.D.   On: 09/02/2016 17:43   Ct Femur Left W Contrast  Result Date: 09/02/2016 CLINICAL DATA:  Large bruise in the left anterior thigh after fall. EXAM: CT OF THE LOWER RIGHT EXTREMITY WITH CONTRAST TECHNIQUE: Multidetector CT imaging of the lower right extremity was performed according to the standard protocol following intravenous contrast administration. COMPARISON:  Same date left hip radiographs CONTRAST:  22mL ISOVUE-300 IOPAMIDOL (ISOVUE-300) INJECTION 61% FINDINGS: Bones/Joint/Cartilage Intact left femoral nail fixation of the left hip. The adjacent pubic rami, hip joint and femoral shaft appear intact. There is  osteoarthritic joint space narrowing of the femorotibial compartment more so along the medial aspect with chondrocalcinosis and subchondral cystic change of the tibial plateau and femoral condyles. Small joint effusion is noted of the knee. Small popliteal cyst is also present. Ligaments Suboptimally assessed by CT. Muscles and Tendons Large intramuscular hematoma with small focus of active hemorrhage, series 4, image 173 involving the left vastus lateralis muscle. This measures approximately 23.9 x 7.7 x 4.8 cm along the anterior aspect of the left thigh in CC by transverse by AP dimension. Soft tissues Mild soft tissue contusion along the lateral aspect of the left thigh. IMPRESSION: 1. Intramuscular hematoma with small focus of active hemorrhage within the left vastus lateralis muscle measuring 23.9 x 7.7 x 4.8 cm. 2. Intact left femoral nail fixation of the hip. Osteoarthritis of the left knee. No fracture is identified or dislocation noted. Electronically Signed   By: Ashley Royalty M.D.   On: 09/02/2016 19:42   Dg Femur Min 2 Views Left  Result Date: 09/02/2016 CLINICAL DATA:  Trip and fall yesterday with left leg pain, initial encounter EXAM: LEFT FEMUR 2 VIEWS COMPARISON:  None. FINDINGS: Proximal medullary rod is noted on the left with fixation screw traversing the femoral neck. Old healed fracture of the proximal femur is noted. Pelvic ring as visualized is within normal limits. No loosening is seen. No new fracture is noted. Diffuse vascular calcifications are seen. No soft tissue abnormality is noted. IMPRESSION: Chronic changes without acute abnormality. Electronically Signed   By: Inez Catalina M.D.   On: 09/02/2016 17:46  EKG: Orders placed or performed during the hospital encounter of 09/02/16  . ED EKG  . ED EKG    IMPRESSION AND PLAN: Patient is a 83 year old white female status post fall with left thigh hematoma on  eliquis  1. Left thigh hematoma- this is related to trauma We'll  follow hemoglobin and transfuse as needed Hold eliquis Consult orthopedics.  2. Atrial fibrillation, continue amiodarone  3. Hyperlipidemia continue Zocor  4. Accelerated hypertension I will add when necessary IV hydralazine likely related to pain  5. Misc: scd's for dvt proph  6. CODE STATUS discussed with the patient she wishes to be DO NOT RESUSCITATE  All the records are reviewed and case discussed with ED provider. Management plans discussed with the patient, family and they are in agreement.  CODE STATUS: Code Status History    Date Active Date Inactive Code Status Order ID Comments User Context   07/15/2015  4:39 AM 07/20/2015  4:00 PM Full Code [id number]  Saundra Shelling, MD ED   07/02/2015  7:03 PM 07/09/2015  6:12 PM Full Code [id number]  Hower, Aaron Mose, MD ED   06/19/2015  3:15 AM 06/20/2015  4:31 PM Full Code [id number]  Saundra Shelling, MD Inpatient    Advance Directive Documentation     Most Recent Value  Type of Advance Directive  Living will  Pre-existing out of facility DNR order (yellow form or pink MOST form)  -  "MOST" Form in Place?  -       TOTAL TIME TAKING CARE OF THIS PATIENT: 55 minutes.    Dustin Flock M.D on 09/02/2016 at 9:28 PM  Between 7am to 6pm - Pager - [phone]  After 6pm go to [email or web address] - password [email or web address] [facility]  Office  [phone]  CC: Primary care physician; [name], MD

## 2016-09-03 LAB — BASIC METABOLIC PANEL
ANION GAP: 5 (ref 5–15)
BUN: 13 mg/dL (ref 6–20)
CHLORIDE: 100 mmol/L — AB (ref 101–111)
CO2: 29 mmol/L (ref 22–32)
Calcium: 8.5 mg/dL — ABNORMAL LOW (ref 8.9–10.3)
Creatinine, Ser: 0.82 mg/dL (ref 0.44–1.00)
GFR calc non Af Amer: >60 mL/min (ref >60)
Glucose, Bld: 124 mg/dL — ABNORMAL HIGH (ref 65–99)
Potassium: 3.8 mmol/L (ref 3.5–5.1)
Sodium: 134 mmol/L — ABNORMAL LOW (ref 135–145)

## 2016-09-03 LAB — CBC
HCT: 33 % — ABNORMAL LOW (ref 35.0–47.0)
HEMOGLOBIN: 11.1 g/dL — AB (ref 12.0–16.0)
MCH: 28.8 pg (ref 26.0–34.0)
MCHC: 33.6 g/dL (ref 32.0–36.0)
MCV: 85.8 fL (ref 80.0–100.0)
Platelets: 145 10*3/uL — ABNORMAL LOW (ref 150–440)
RBC: 3.84 MIL/uL (ref 3.80–5.20)
RDW: 13.2 % (ref 11.5–14.5)
WBC: 6.1 10*3/uL (ref 3.6–11.0)

## 2016-09-03 LAB — HEMOGLOBIN
HEMOGLOBIN: 11.3 g/dL — AB (ref 12.0–16.0)
HEMOGLOBIN: 10.3 g/dL — AB (ref 12.0–16.0)

## 2016-09-03 LAB — URINALYSIS, COMPLETE (UACMP) WITH MICROSCOPIC
BILIRUBIN URINE: NEGATIVE
GLUCOSE, UA: NEGATIVE mg/dL
Hgb urine dipstick: NEGATIVE
KETONES UR: NEGATIVE mg/dL
LEUKOCYTES UA: NEGATIVE
Nitrite: NEGATIVE
PH: 6 (ref 5.0–8.0)
Protein, ur: NEGATIVE mg/dL
SPECIFIC GRAVITY, URINE: 1.031 — AB (ref 1.005–1.030)

## 2016-09-03 NOTE — Progress Notes (Signed)
Pts BP 108/45, scheduled lasix, potasium (3.7) and amiodarone ordered this am. MD Tressia Miners notified,  per MD [facility] hold lasix and potasium and give amiodarone. Pt also has malodorous urine, orders received for urine specimen.  MD placing orders.

## 2016-09-03 NOTE — Consult Note (Signed)
ORTHOPAEDIC CONSULTATION  REQUESTING PHYSICIAN: Gladstone Lighter, MD  Chief Complaint: Left thigh pain  HPI: Caroline Aguilar is a 83 y.o. female who complains of  Left thigh pain and swelling after a mechanical fall. She denies any LOC. She fell in the bathroom, and intially was able to ambulate with her walker, but her pain progressed until the point she was not able to bear weight. She is on eliquis for Afib.  Past Medical History:  Diagnosis Date  . A-fib ([place])   . Angina pectoris ([place])   . Cancer [facility])    Colon cancer  . Hyperlipidemia    Past Surgical History:  Procedure Laterality Date  . CARDIAC CATHETERIZATION N/A 07/19/2015   Procedure: Right and Left Heart Cath;  Surgeon: Isaias Cowman, MD;  Location: [place] CV LAB;  Service: Cardiovascular;  Laterality: N/A;  . CATARACT EXTRACTION Bilateral   . COLON SURGERY    . ESOPHAGOGASTRODUODENOSCOPY N/A 07/08/2015   Procedure: ESOPHAGOGASTRODUODENOSCOPY (EGD);  Surgeon: Hulen Luster, MD;  Location: [facility] ENDOSCOPY;  Service: Endoscopy;  Laterality: N/A;  . HIP SURGERY Bilateral    Social History   Social History  . Marital status: Married    Spouse name: N/A  . Number of children: N/A  . Years of education: N/A   Occupational History  . retired    Social History Main Topics  . Smoking status: Never Smoker  . Smokeless tobacco: Never Used  . Alcohol use No  . Drug use: No  . Sexual activity: Not Asked   Other Topics Concern  . None   Social History Narrative  . None   Family History  Problem Relation Age of Onset  . Cancer Mother   . Heart failure Father    Allergies  Allergen Reactions  . Penicillins Anaphylaxis and Other (See Comments)    Has patient had a PCN reaction causing immediate rash, facial/tongue/throat swelling, SOB or lightheadedness with hypotension: Yes Has patient had a PCN reaction causing severe rash involving mucus membranes or skin necrosis: No Has patient had a PCN reaction  that required hospitalization No Has patient had a PCN reaction occurring within the last 10 years: No If all of the above answers are "NO", then may proceed with Cephalosporin use.  . Azithromycin Other (See Comments)    Reaction:  Unknown   . Famciclovir Other (See Comments)    Reaction:  Unknown    Prior to Admission medications   Medication Sig Start Date End Date Taking? Authorizing Provider  amiodarone (PACERONE) 200 MG tablet Take 1 tablet (200 mg total) by mouth daily. Patient taking differently: Take 100 mg by mouth 2 (two) times daily.  07/20/15  Yes Wieting, Richard, MD  apixaban (ELIQUIS) 2.5 MG TABS tablet Take 1 tablet (2.5 mg total) by mouth 2 (two) times daily. 07/09/15  Yes Epifanio Lesches, MD  Calcium Carbonate-Vitamin D (CALCIUM 600+D) 600-200 MG-UNIT TABS Take 1 tablet by mouth daily.   Yes [provider]  docusate sodium (COLACE) 100 MG capsule Take 100 mg by mouth daily. Reported on 08/05/2015   Yes [provider]  latanoprost (XALATAN) 0.005 % ophthalmic solution Place 1 drop into both eyes at bedtime.   Yes [provider]  meloxicam (MOBIC) 7.5 MG tablet Take 7.5 mg by mouth daily as needed. 06/08/16  Yes [provider]  pantoprazole (PROTONIX) 40 MG tablet Take 40 mg by mouth daily.   Yes [provider]  potassium chloride (KLOR-CON) 20 MEQ packet Take  20 mEq by mouth 2 (two) times daily. Patient taking differently: Take 20 mEq by mouth daily.  07/20/15  Yes Wieting, Richard, MD  simvastatin (ZOCOR) 20 MG tablet Take 20 mg by mouth at bedtime.   Yes [provider]  timolol (TIMOPTIC) 0.5 % ophthalmic solution Place 1 drop into both eyes 2 (two) times daily.   Yes [provider]  traMADol (ULTRAM) 50 MG tablet Take 50 mg by mouth as needed. 06/08/16  Yes [provider]  feeding supplement, ENSURE ENLIVE, (ENSURE ENLIVE) LIQD Take 237 mLs by mouth 2 (two) times daily between meals. 07/09/15    Epifanio Lesches, MD  furosemide (LASIX) 40 MG tablet Take 1 tablet (40 mg total) by mouth 2 (two) times daily. Patient not taking: Reported on 09/02/2016 07/20/15   [name], MD  sodium chloride 1 g tablet Take 1 tablet (1 g total) by mouth 2 (two) times daily with a meal. Patient not taking: Reported on 09/02/2016 07/09/15   Epifanio Lesches, MD   Dg Chest 2 View  Result Date: 09/02/2016 CLINICAL DATA:  Recent fall with left chest pain, initial encounter EXAM: CHEST  2 VIEW COMPARISON:  07/14/2015 FINDINGS: Cardiac shadow is at the upper limits of normal in size. There is been interval TAVR procedure. Aortic calcifications are noted. No focal infiltrate or sizable effusion is seen. No pneumothorax is noted. Previously seen left pleural effusion has resolved. No acute bony abnormality is seen. IMPRESSION: No acute abnormality noted. Electronically Signed   By: Inez Catalina M.D.   On: 09/02/2016 17:51   Dg Pelvis 1-2 Views  Result Date: 09/02/2016 CLINICAL DATA:  Fall yesterday with pelvic pain, initial encounter EXAM: PELVIS - 1-2 VIEW COMPARISON:  07/26/2014 FINDINGS: Changes consistent with prior bilateral femoral fractures with fixation are seen. The pelvic ring is intact. Degenerative changes of the lumbar spine are noted. No soft tissue abnormality is seen. IMPRESSION: Prior femoral fractures with fixation.  No acute abnormality noted. Electronically Signed   By: Inez Catalina M.D.   On: 09/02/2016 17:49   Ct Head Wo Contrast  Result Date: 09/02/2016 CLINICAL DATA:  Recent fall with headaches and neck pain, initial encounter EXAM: CT HEAD WITHOUT CONTRAST CT CERVICAL SPINE WITHOUT CONTRAST TECHNIQUE: Multidetector CT imaging of the head and cervical spine was performed following the standard protocol without intravenous contrast. Multiplanar CT image reconstructions of the cervical spine were also generated. COMPARISON:  10/28/2015 FINDINGS: CT HEAD FINDINGS Brain: No evidence of  acute infarction, hemorrhage, hydrocephalus, extra-axial collection or mass lesion/mass effect.Mild atrophic changes are noted. Vascular: No hyperdense vessel or unexpected calcification. Skull: Normal. Negative for fracture or focal lesion. Sinuses/Orbits: No acute finding. Other: None. CT CERVICAL SPINE FINDINGS Alignment: Mild loss of the normal cervical lordosis is noted. Skull base and vertebrae: 7 cervical segments are well visualized. Vertebral body height is well maintained. Disc space narrowing is noted at C5-6 and C6-7 with associated osteophytic changes. Multilevel facet hypertrophic changes are seen. No acute fracture or acute facet abnormality is noted. The odontoid is within normal limits. Soft tissues and spinal canal: Within normal limits. Disc levels: Disc space narrowing at C5-6 and C6-7 as described above. Upper chest: Within normal limits. IMPRESSION: CT of the head: Atrophic changes without acute abnormality. CT of the cervical spine: Multilevel degenerative change without acute abnormality. Electronically Signed   By: Inez Catalina M.D.   On: 09/02/2016 17:43   Ct Cervical Spine Wo Contrast  Result Date: 09/02/2016 CLINICAL DATA:  Recent fall with headaches and neck pain, initial encounter EXAM: CT HEAD WITHOUT CONTRAST CT CERVICAL SPINE WITHOUT CONTRAST TECHNIQUE: Multidetector CT imaging of the head and cervical spine was performed following the standard protocol without intravenous contrast. Multiplanar CT image reconstructions of the cervical spine were also generated. COMPARISON:  10/28/2015 FINDINGS: CT HEAD FINDINGS Brain: No evidence of acute infarction, hemorrhage, hydrocephalus, extra-axial collection or mass lesion/mass effect.Mild atrophic changes are noted. Vascular: No hyperdense vessel or unexpected calcification. Skull: Normal. Negative for fracture or focal lesion. Sinuses/Orbits: No acute finding. Other: None. CT CERVICAL SPINE FINDINGS Alignment: Mild loss of the normal  cervical lordosis is noted. Skull base and vertebrae: 7 cervical segments are well visualized. Vertebral body height is well maintained. Disc space narrowing is noted at C5-6 and C6-7 with associated osteophytic changes. Multilevel facet hypertrophic changes are seen. No acute fracture or acute facet abnormality is noted. The odontoid is within normal limits. Soft tissues and spinal canal: Within normal limits. Disc levels: Disc space narrowing at C5-6 and C6-7 as described above. Upper chest: Within normal limits. IMPRESSION: CT of the head: Atrophic changes without acute abnormality. CT of the cervical spine: Multilevel degenerative change without acute abnormality. Electronically Signed   By: Inez Catalina M.D.   On: 09/02/2016 17:43   Ct Femur Left W Contrast  Result Date: 09/02/2016 CLINICAL DATA:  Large bruise in the left anterior thigh after fall. EXAM: CT OF THE LOWER RIGHT EXTREMITY WITH CONTRAST TECHNIQUE: Multidetector CT imaging of the lower right extremity was performed according to the standard protocol following intravenous contrast administration. COMPARISON:  Same date left hip radiographs CONTRAST:  39mL ISOVUE-300 IOPAMIDOL (ISOVUE-300) INJECTION 61% FINDINGS: Bones/Joint/Cartilage Intact left femoral nail fixation of the left hip. The adjacent pubic rami, hip joint and femoral shaft appear intact. There is osteoarthritic joint space narrowing of the femorotibial compartment more so along the medial aspect with chondrocalcinosis and subchondral cystic change of the tibial plateau and femoral condyles. Small joint effusion is noted of the knee. Small popliteal cyst is also present. Ligaments Suboptimally assessed by CT. Muscles and Tendons Large intramuscular hematoma with small focus of active hemorrhage, series 4, image 173 involving the left vastus lateralis muscle. This measures approximately 23.9 x 7.7 x 4.8 cm along the anterior aspect of the left thigh in CC by transverse by AP dimension.  Soft tissues Mild soft tissue contusion along the lateral aspect of the left thigh. IMPRESSION: 1. Intramuscular hematoma with small focus of active hemorrhage within the left vastus lateralis muscle measuring 23.9 x 7.7 x 4.8 cm. 2. Intact left femoral nail fixation of the hip. Osteoarthritis of the left knee. No fracture is identified or dislocation noted. Electronically Signed   By: Ashley Royalty M.D.   On: 09/02/2016 19:42   Dg Femur Min 2 Views Left  Result Date: 09/02/2016 CLINICAL DATA:  Trip and fall yesterday with left leg pain, initial encounter EXAM: LEFT FEMUR 2 VIEWS COMPARISON:  None. FINDINGS: Proximal medullary rod is noted on the left with fixation screw traversing the femoral neck. Old healed fracture of the proximal femur is noted. Pelvic ring as visualized is within normal limits. No loosening is seen. No new fracture is noted. Diffuse vascular calcifications are seen. No soft tissue abnormality is noted. IMPRESSION: Chronic changes without acute abnormality. Electronically Signed   By: Inez Catalina M.D.   On: 09/02/2016 17:46    Positive ROS: All other systems have been reviewed and were otherwise negative with the exception  of those mentioned in the HPI and as above.  Physical Exam: General: Alert, no acute distress Cardiovascular: No pedal edema Respiratory: No cyanosis, no use of accessory musculature GI: No organomegaly, abdomen is soft and non-tender Skin: No lesions in the area of chief complaint Neurologic: Sensation intact distally Psychiatric: Patient is competent for consent with normal mood and affect Lymphatic: No axillary or cervical lymphadenopathy  MUSCULOSKELETAL: All left thigh compartments are soft and supple. She fas a focal area of ecchymosis around her posterolateral mid thigh, but again all compartments are easily compressible.  Assessment: Stable Left thigh hematoma after fall. No evidence of fracture and no concern for compartment syndrome or expanding  hematoma.  Plan: WBAT to LLE. No surgical intervention warranted at this time.       09/03/2016 9:31 AM

## 2016-09-03 NOTE — Progress Notes (Signed)
[facility] [place] NAME: Caroline Aguilar    MR#:  [id number]  DATE OF BIRTH:  [birth year]  SUBJECTIVE:  CHIEF COMPLAINT:   Chief Complaint  Patient presents with  . Fall    REVIEW OF SYSTEMS:  Review of Systems  Constitutional: Negative for chills, fever and malaise/fatigue.  HENT: Negative for congestion, hearing loss, nosebleeds and sore throat.   Eyes: Negative for blurred vision and double vision.  Respiratory: Negative for cough, shortness of breath and wheezing.   Cardiovascular: Negative for chest pain and palpitations.  Gastrointestinal: Negative for abdominal pain, constipation, diarrhea, nausea and vomiting.  Genitourinary: Negative for dysuria.  Musculoskeletal: Positive for myalgias. Negative for back pain.  Neurological: Negative for dizziness, speech change, focal weakness, seizures and headaches.  Psychiatric/Behavioral: Negative for depression.    DRUG ALLERGIES:   Allergies  Allergen Reactions  . Penicillins Anaphylaxis and Other (See Comments)    Has patient had a PCN reaction causing immediate rash, facial/tongue/throat swelling, SOB or lightheadedness with hypotension: Yes Has patient had a PCN reaction causing severe rash involving mucus membranes or skin necrosis: No Has patient had a PCN reaction that required hospitalization No Has patient had a PCN reaction occurring within the last 10 years: No If all of the above answers are "NO", then may proceed with Cephalosporin use.  . Azithromycin Other (See Comments)    Reaction:  Unknown   . Famciclovir Other (See Comments)    Reaction:  Unknown     VITALS:  Blood pressure (!) 104/50, pulse 66, temperature 98.4 F (36.9 C), temperature source Oral, resp. rate 18, height 5\' 5"  (1.651 m), weight 74.2 kg (163 lb 8 oz), SpO2 96 %.  PHYSICAL EXAMINATION:  Physical Exam  GENERAL:  83 y.o.-year-old pleasant patient lying in the bed with no acute distress.    EYES: Pupils equal, round, reactive to light and accommodation. No scleral icterus. Extraocular muscles intact.  HEENT: Head atraumatic, normocephalic. Oropharynx and nasopharynx clear.  NECK:  Supple, no jugular venous distention. No thyroid enlargement, no tenderness.  LUNGS: Normal breath sounds bilaterally, no wheezing, rales,rhonchi or crepitation. No use of accessory muscles of respiration.  CARDIOVASCULAR: S1, S2 normal. No  rubs, or gallops. 3/6 systolic murmur present ABDOMEN: Soft, nontender, nondistended. Bowel sounds present. No organomegaly or mass.  EXTREMITIES: No pedal edema, cyanosis, or clubbing. Left thigh with tight ACE wrap, swelling noted around it. NEUROLOGIC: Cranial nerves II through XII are intact. Muscle strength 5/5 in all extremities. Sensation intact. Gait not checked.  PSYCHIATRIC: [name].  SKIN: No obvious rash, lesion, or ulcer.    LABORATORY PANEL:   CBC  Recent Labs Lab 09/03/16 0440  WBC 6.1  HGB 11.1*  HCT 33.0*  PLT 145*   ------------------------------------------------------------------------------------------------------------------  Chemistries   Recent Labs Lab 09/02/16 1634 09/03/16 0440  NA 132* 134*  K 3.6 3.8  CL 95* 100*  CO2 29 29  GLUCOSE 153* 124*  BUN 13 13  CREATININE 1.01* 0.82  CALCIUM 8.5* 8.5*  AST 22  --   ALT 13*  --   ALKPHOS 57  --   BILITOT 0.9  --    ------------------------------------------------------------------------------------------------------------------  Cardiac Enzymes No results for input(s): TROPONINI in the last 168 hours. ------------------------------------------------------------------------------------------------------------------  RADIOLOGY:  Dg Chest 2 View  Result Date: 09/02/2016 CLINICAL DATA:  Recent fall with left chest pain, initial encounter EXAM: CHEST  2 VIEW COMPARISON:  07/14/2015  FINDINGS: Cardiac shadow is at the upper limits of normal  in size. There is been interval TAVR procedure. Aortic calcifications are noted. No focal infiltrate or sizable effusion is seen. No pneumothorax is noted. Previously seen left pleural effusion has resolved. No acute bony abnormality is seen. IMPRESSION: No acute abnormality noted. Electronically Signed   By: Inez Catalina M.D.   On: 09/02/2016 17:51   Dg Pelvis 1-2 Views  Result Date: 09/02/2016 CLINICAL DATA:  Fall yesterday with pelvic pain, initial encounter EXAM: PELVIS - 1-2 VIEW COMPARISON:  07/26/2014 FINDINGS: Changes consistent with prior bilateral femoral fractures with fixation are seen. The pelvic ring is intact. Degenerative changes of the lumbar spine are noted. No soft tissue abnormality is seen. IMPRESSION: Prior femoral fractures with fixation.  No acute abnormality noted. Electronically Signed   By: Inez Catalina M.D.   On: 09/02/2016 17:49   Ct Head Wo Contrast  Result Date: 09/02/2016 CLINICAL DATA:  Recent fall with headaches and neck pain, initial encounter EXAM: CT HEAD WITHOUT CONTRAST CT CERVICAL SPINE WITHOUT CONTRAST TECHNIQUE: Multidetector CT imaging of the head and cervical spine was performed following the standard protocol without intravenous contrast. Multiplanar CT image reconstructions of the cervical spine were also generated. COMPARISON:  10/28/2015 FINDINGS: CT HEAD FINDINGS Brain: No evidence of acute infarction, hemorrhage, hydrocephalus, extra-axial collection or mass lesion/mass effect.Mild atrophic changes are noted. Vascular: No hyperdense vessel or unexpected calcification. Skull: Normal. Negative for fracture or focal lesion. Sinuses/Orbits: No acute finding. Other: None. CT CERVICAL SPINE FINDINGS Alignment: Mild loss of the normal cervical lordosis is noted. Skull base and vertebrae: 7 cervical segments are well visualized. Vertebral body height is well maintained. Disc space narrowing is noted at C5-6 and C6-7 with associated osteophytic changes. Multilevel  facet hypertrophic changes are seen. No acute fracture or acute facet abnormality is noted. The odontoid is within normal limits. Soft tissues and spinal canal: Within normal limits. Disc levels: Disc space narrowing at C5-6 and C6-7 as described above. Upper chest: Within normal limits. IMPRESSION: CT of the head: Atrophic changes without acute abnormality. CT of the cervical spine: Multilevel degenerative change without acute abnormality. Electronically Signed   By: Inez Catalina M.D.   On: 09/02/2016 17:43   Ct Cervical Spine Wo Contrast  Result Date: 09/02/2016 CLINICAL DATA:  Recent fall with headaches and neck pain, initial encounter EXAM: CT HEAD WITHOUT CONTRAST CT CERVICAL SPINE WITHOUT CONTRAST TECHNIQUE: Multidetector CT imaging of the head and cervical spine was performed following the standard protocol without intravenous contrast. Multiplanar CT image reconstructions of the cervical spine were also generated. COMPARISON:  10/28/2015 FINDINGS: CT HEAD FINDINGS Brain: No evidence of acute infarction, hemorrhage, hydrocephalus, extra-axial collection or mass lesion/mass effect.Mild atrophic changes are noted. Vascular: No hyperdense vessel or unexpected calcification. Skull: Normal. Negative for fracture or focal lesion. Sinuses/Orbits: No acute finding. Other: None. CT CERVICAL SPINE FINDINGS Alignment: Mild loss of the normal cervical lordosis is noted. Skull base and vertebrae: 7 cervical segments are well visualized. Vertebral body height is well maintained. Disc space narrowing is noted at C5-6 and C6-7 with associated osteophytic changes. Multilevel facet hypertrophic changes are seen. No acute fracture or acute facet abnormality is noted. The odontoid is within normal limits. Soft tissues and spinal canal: Within normal limits. Disc levels: Disc space narrowing at C5-6 and C6-7 as described above. Upper chest: Within normal limits. IMPRESSION: CT of the head: Atrophic changes without acute  abnormality. CT of the cervical spine: Multilevel degenerative  change without acute abnormality. Electronically Signed   By: Inez Catalina M.D.   On: 09/02/2016 17:43   Ct Femur Left W Contrast  Result Date: 09/02/2016 CLINICAL DATA:  Large bruise in the left anterior thigh after fall. EXAM: CT OF THE LOWER RIGHT EXTREMITY WITH CONTRAST TECHNIQUE: Multidetector CT imaging of the lower right extremity was performed according to the standard protocol following intravenous contrast administration. COMPARISON:  Same date left hip radiographs CONTRAST:  13mL ISOVUE-300 IOPAMIDOL (ISOVUE-300) INJECTION 61% FINDINGS: Bones/Joint/Cartilage Intact left femoral nail fixation of the left hip. The adjacent pubic rami, hip joint and femoral shaft appear intact. There is osteoarthritic joint space narrowing of the femorotibial compartment more so along the medial aspect with chondrocalcinosis and subchondral cystic change of the tibial plateau and femoral condyles. Small joint effusion is noted of the knee. Small popliteal cyst is also present. Ligaments Suboptimally assessed by CT. Muscles and Tendons Large intramuscular hematoma with small focus of active hemorrhage, series 4, image 173 involving the left vastus lateralis muscle. This measures approximately 23.9 x 7.7 x 4.8 cm along the anterior aspect of the left thigh in CC by transverse by AP dimension. Soft tissues Mild soft tissue contusion along the lateral aspect of the left thigh. IMPRESSION: 1. Intramuscular hematoma with small focus of active hemorrhage within the left vastus lateralis muscle measuring 23.9 x 7.7 x 4.8 cm. 2. Intact left femoral nail fixation of the hip. Osteoarthritis of the left knee. No fracture is identified or dislocation noted. Electronically Signed   By: Ashley Royalty M.D.   On: 09/02/2016 19:42   Dg Femur Min 2 Views Left  Result Date: 09/02/2016 CLINICAL DATA:  Trip and fall yesterday with left leg pain, initial encounter EXAM: LEFT  FEMUR 2 VIEWS COMPARISON:  None. FINDINGS: Proximal medullary rod is noted on the left with fixation screw traversing the femoral neck. Old healed fracture of the proximal femur is noted. Pelvic ring as visualized is within normal limits. No loosening is seen. No new fracture is noted. Diffuse vascular calcifications are seen. No soft tissue abnormality is noted. IMPRESSION: Chronic changes without acute abnormality. Electronically Signed   By: Inez Catalina M.D.   On: 09/02/2016 17:46    EKG:   Orders placed or performed during the hospital encounter of 09/02/16  . ED EKG  . 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  #1 left thigh hematoma-appreciate orthopedic consult. -Continue with compression bandage. No active bleeding noted at this time. -Continue to monitor serial hemoglobins. No evidence of any compartment syndrome. -Conservative management recommended. Hold eliquis at this time.  #2 atrial fibrillation-rate well controlled at this time. Continue amiodarone. Eliquis is on hold at this time. Patient is status post TAVR and check anticoagulation status for the valve replacement surgery at this time -With her multiple fall history, not sure if she is a good candidate for anticoagulation.  #3 hyperlipidemia-continue Zocor   #4 GERD-on Protonix  #5 DVT prophylaxis-Ted's and SCDs  Physical therapy consult.-   All the records are reviewed and case discussed with Care Management/Social Workerr. Management plans discussed with the patient, family and they are in agreement.  CODE STATUS: DNR  TOTAL TIME TAKING CARE OF THIS PATIENT: 38 minutes.   POSSIBLE D/C IN 1-2  DAYS, DEPENDING ON CLINICAL CONDITION.   Korene Dula M.D on  09/03/2016 at 11:54 AM  Between 7am to 6pm - Pager - [phone]  After 6pm go to [email or web address] - password [email or web address] [facility]  Office  [phone]  CC: Primary care physician; [name], MD

## 2016-09-04 ENCOUNTER — Inpatient Hospital Stay: Payer: Medicare HMO

## 2016-09-04 MED ORDER — METAXALONE 800 MG PO TABS
400.0000 mg | ORAL_TABLET | Freq: Three times a day (TID) | ORAL | Status: DC
  Administered 2016-09-04 – 2016-09-05 (×4): 400 mg via ORAL
  Filled 2016-09-04: qty 1
  Filled 2016-09-04: qty 2
  Filled 2016-09-04 (×2): qty 1

## 2016-09-04 MED ORDER — LEVOFLOXACIN 500 MG PO TABS
250.0000 mg | ORAL_TABLET | Freq: Every day | ORAL | Status: DC
  Administered 2016-09-04 – 2016-09-05 (×2): 250 mg via ORAL
  Filled 2016-09-04 (×2): qty 1

## 2016-09-04 MED ORDER — HYDROCODONE-ACETAMINOPHEN 5-325 MG PO TABS
1.0000 | ORAL_TABLET | ORAL | Status: DC | PRN
Start: 2016-09-04 — End: 2016-09-05
  Administered 2016-09-05 (×3): 1 via ORAL
  Filled 2016-09-04 (×3): qty 1

## 2016-09-04 MED ORDER — ALUM & MAG HYDROXIDE-SIMETH 200-200-20 MG/5ML PO SUSP
30.0000 mL | Freq: Four times a day (QID) | ORAL | Status: DC | PRN
  Administered 2016-09-04: 30 mL via ORAL
  Filled 2016-09-04: qty 30

## 2016-09-04 NOTE — Progress Notes (Addendum)
Clinical Education officer, museum (CSW) presented bed offers to patient and she chose [facility]. Per [facility] admissions coordinator at [facility] she will start [place] today. CSW attempted to contact patient's son Edd Arbour however he did not answer and a voicemail was left. CSW will continue to follow and assist as needed.   Patient's son Edd Arbour called CSW and is in agreement with D/C plan.   [employer], LCSW [phone]

## 2016-09-04 NOTE — NC FL2 (Signed)
[place] LEVEL OF CARE SCREENING TOOL     IDENTIFICATION  Patient Name: Caroline Aguilar Birthdate: 1925-09-23 Sex: female Admission Date (Current Location): 09/02/2016  [place] and [place] Number:  Engineering geologist and Address:  [facility], [address], [place], [place] [address]      Provider Number: [id number]  Attending Physician Name and Address:  Gladstone Lighter, MD  Relative Name and Phone Number:       Current Level of Care: Hospital Recommended Level of Care: [place] Prior Approval Number:    Date Approved/Denied:   PASRR Number:  ([id number] )  Discharge Plan: SNF    Current Diagnoses: Patient Active Problem List   Diagnosis Date Noted  . Thigh hematoma 09/02/2016  . Dyspnea 07/15/2015  . Chest pain at rest 07/15/2015  . Community acquired pneumonia 07/02/2015  . Hyponatremia 07/02/2015  . Atrial fibrillation with rapid ventricular response ([place]) 07/02/2015    Orientation RESPIRATION BLADDER Height & Weight     Self, Time, Situation, Place  Normal Continent Weight: 163 lb 8 oz (74.2 kg) Height:  5\' 5"  (165.1 cm)  BEHAVIORAL SYMPTOMS/MOOD NEUROLOGICAL BOWEL NUTRITION STATUS   (none)  (none) Continent Diet (Diet: Heart Healthy )  AMBULATORY STATUS COMMUNICATION OF NEEDS Skin   Extensive Assist Verbally Other (Comment) (left thigh hematoma)                       Personal Care Assistance Level of Assistance  Bathing, Feeding, Dressing Bathing Assistance: Limited assistance Feeding assistance: Independent Dressing Assistance: Limited assistance     Functional Limitations Info  Sight, Hearing, Speech Sight Info: Adequate Hearing Info: Adequate Speech Info: Adequate    SPECIAL CARE FACTORS FREQUENCY  PT (By licensed PT), OT (By licensed OT)     PT Frequency:  (5) OT Frequency:  (5)            Contractures      Additional Factors Info  Code Status, Allergies Code  Status Info:  (DNR ) Allergies Info:  (Penicillins, Azithromycin, Famciclovir)           Current Medications (09/04/2016):  This is the current hospital active medication list Current Facility-Administered Medications  Medication Dose Route Frequency Provider Last Rate Last Dose  . 0.9 %  sodium chloride infusion  250 mL Intravenous PRN Dustin Flock, MD      . alum & mag hydroxide-simeth (MAALOX/MYLANTA) 200-200-20 MG/5ML suspension 30 mL  30 mL Oral Q6H PRN [place] Lighter, MD   30 mL at 09/04/16 0012  . amiodarone (PACERONE) tablet 200 mg  200 mg Oral Daily Dustin Flock, MD   200 mg at 09/04/16 0906  . calcium-vitamin D (OSCAL WITH D) 500-200 MG-UNIT per tablet 1 tablet  1 tablet Oral Daily Dustin Flock, MD      . hydrALAZINE (APRESOLINE) injection 10 mg  10 mg Intravenous Q6H PRN Dustin Flock, MD   10 mg at 09/02/16 2123  . HYDROcodone-acetaminophen (NORCO/VICODIN) 5-325 MG per tablet 1 tablet  1 tablet Oral Q4H PRN [place] Lighter, MD      . latanoprost (XALATAN) 0.005 % ophthalmic solution 1 drop  1 drop Both Eyes QHS Dustin Flock, MD   1 drop at 09/03/16 2047  . levofloxacin (LEVAQUIN) tablet 250 mg  250 mg Oral Daily [place] Lighter, MD   250 mg at 09/04/16 0907  . meloxicam (MOBIC) tablet 7.5 mg  7.5 mg Oral Daily Dustin Flock, MD  7.5 mg at 09/04/16 0906  . metaxalone (SKELAXIN) tablet 400 mg  400 mg Oral TID [place] Lighter, MD      . morphine 2 MG/ML injection 2 mg  2 mg Intravenous Q4H PRN Dustin Flock, MD      . ondansetron (ZOFRAN) tablet 4 mg  4 mg Oral Q6H PRN Dustin Flock, MD       Or  . ondansetron (ZOFRAN) injection 4 mg  4 mg Intravenous Q6H PRN Dustin Flock, MD      . pantoprazole (PROTONIX) EC tablet 40 mg  40 mg Oral Daily Dustin Flock, MD   40 mg at 09/04/16 0906  . simvastatin (ZOCOR) tablet 20 mg  20 mg Oral QHS Dustin Flock, MD   20 mg at 09/03/16 2046  . sodium chloride flush (NS) 0.9 % injection 3 mL  3 mL Intravenous  Q12H Dustin Flock, MD   3 mL at 09/04/16 0910  . sodium chloride flush (NS) 0.9 % injection 3 mL  3 mL Intravenous PRN Dustin Flock, MD      . sodium chloride tablet 1 g  1 g Oral BID WC Dustin Flock, MD   1 g at 09/04/16 0906  . timolol (TIMOPTIC) 0.5 % ophthalmic solution 1 drop  1 drop Both Eyes BID Dustin Flock, MD   1 drop at 09/04/16 4235     Discharge Medications: Please see discharge summary for a list of discharge medications.  Relevant Imaging Results:  Relevant Lab Results:   Additional Information  (SSN: [id number])  Tait Balistreri, Veronia Beets, LCSW

## 2016-09-04 NOTE — Progress Notes (Addendum)
[place] [facility] [place] NAME: Caroline Aguilar    MR#:  [id number]  DATE OF BIRTH:  [birth year]  SUBJECTIVE:  CHIEF COMPLAINT:   Chief Complaint  Patient presents with  . Fall   - worse pain on inspiration in left side of chest and also muscle spasms in the back  REVIEW OF SYSTEMS:  Review of Systems  Constitutional: Negative for chills, fever and malaise/fatigue.  HENT: Negative for congestion, hearing loss, nosebleeds and sore throat.   Eyes: Negative for blurred vision and double vision.  Respiratory: Negative for cough, shortness of breath and wheezing.   Cardiovascular: Positive for chest pain. Negative for palpitations.  Gastrointestinal: Negative for abdominal pain, constipation, diarrhea, nausea and vomiting.  Genitourinary: Negative for dysuria.  Musculoskeletal: Positive for myalgias. Negative for back pain.  Neurological: Negative for dizziness, speech change, focal weakness, seizures and headaches.  Psychiatric/Behavioral: Negative for depression.    DRUG ALLERGIES:   Allergies  Allergen Reactions  . Penicillins Anaphylaxis and Other (See Comments)    Has patient had a PCN reaction causing immediate rash, facial/tongue/throat swelling, SOB or lightheadedness with hypotension: Yes Has patient had a PCN reaction causing severe rash involving mucus membranes or skin necrosis: No Has patient had a PCN reaction that required hospitalization No Has patient had a PCN reaction occurring within the last 10 years: No If all of the above answers are "NO", then may proceed with Cephalosporin use.  . Azithromycin Other (See Comments)    Reaction:  Unknown   . Famciclovir Other (See Comments)    Reaction:  Unknown     VITALS:  Blood pressure 136/61, pulse 66, temperature 98 F (36.7 C), temperature source Oral, resp. rate 18, height 5\' 5"  (1.651 m), weight 74.2 kg (163 lb 8 oz), SpO2 95 %.  PHYSICAL EXAMINATION:  Physical  Exam  GENERAL:  83 y.o.-year-old pleasant patient lying in the bed with no acute distress.  EYES: Pupils equal, round, reactive to light and accommodation. No scleral icterus. Extraocular muscles intact.  HEENT: Head atraumatic, normocephalic. Oropharynx and nasopharynx clear.  NECK:  Supple, no jugular venous distention. No thyroid enlargement, no tenderness.  LUNGS: Normal breath sounds bilaterally, no wheezing, rales,rhonchi or crepitation. No use of accessory muscles of respiration.  Inspiratory chest pain on left lateral chest CARDIOVASCULAR: S1, S2 normal. No  rubs, or gallops. 3/6 systolic murmur present ABDOMEN: Soft, nontender, nondistended. Bowel sounds present. No organomegaly or mass.  EXTREMITIES: No pedal edema, cyanosis, or clubbing. Left thigh with tight ACE wrap, swelling noted around it. NEUROLOGIC: Cranial nerves II through XII are intact. Muscle strength 5/5 in all extremities. Sensation intact. Gait not checked.  PSYCHIATRIC: [name].  SKIN: No obvious rash, lesion, or ulcer.    LABORATORY PANEL:   CBC  Recent Labs Lab 09/03/16 0440  09/03/16 2050  WBC 6.1  --   --   HGB 11.1*  < > 10.3*  HCT 33.0*  --   --   PLT 145*  --   --   < > = values in this interval not displayed. ------------------------------------------------------------------------------------------------------------------  Chemistries   Recent Labs Lab 09/02/16 1634 09/03/16 0440  NA 132* 134*  K 3.6 3.8  CL 95* 100*  CO2 29 29  GLUCOSE 153* 124*  BUN 13 13  CREATININE 1.01* 0.82  CALCIUM 8.5* 8.5*  AST 22  --   ALT 13*  --   ALKPHOS 57  --  BILITOT 0.9  --    ------------------------------------------------------------------------------------------------------------------  Cardiac Enzymes No results for input(s): TROPONINI in the last 168  hours. ------------------------------------------------------------------------------------------------------------------  RADIOLOGY:  Dg Chest 2 View  Result Date: 09/02/2016 CLINICAL DATA:  Recent fall with left chest pain, initial encounter EXAM: CHEST  2 VIEW COMPARISON:  07/14/2015 FINDINGS: Cardiac shadow is at the upper limits of normal in size. There is been interval TAVR procedure. Aortic calcifications are noted. No focal infiltrate or sizable effusion is seen. No pneumothorax is noted. Previously seen left pleural effusion has resolved. No acute bony abnormality is seen. IMPRESSION: No acute abnormality noted. Electronically Signed   By: Inez Catalina M.D.   On: 09/02/2016 17:51   Dg Pelvis 1-2 Views  Result Date: 09/02/2016 CLINICAL DATA:  Fall yesterday with pelvic pain, initial encounter EXAM: PELVIS - 1-2 VIEW COMPARISON:  07/26/2014 FINDINGS: Changes consistent with prior bilateral femoral fractures with fixation are seen. The pelvic ring is intact. Degenerative changes of the lumbar spine are noted. No soft tissue abnormality is seen. IMPRESSION: Prior femoral fractures with fixation.  No acute abnormality noted. Electronically Signed   By: Inez Catalina M.D.   On: 09/02/2016 17:49   Ct Head Wo Contrast  Result Date: 09/02/2016 CLINICAL DATA:  Recent fall with headaches and neck pain, initial encounter EXAM: CT HEAD WITHOUT CONTRAST CT CERVICAL SPINE WITHOUT CONTRAST TECHNIQUE: Multidetector CT imaging of the head and cervical spine was performed following the standard protocol without intravenous contrast. Multiplanar CT image reconstructions of the cervical spine were also generated. COMPARISON:  10/28/2015 FINDINGS: CT HEAD FINDINGS Brain: No evidence of acute infarction, hemorrhage, hydrocephalus, extra-axial collection or mass lesion/mass effect.Mild atrophic changes are noted. Vascular: No hyperdense vessel or unexpected calcification. Skull: Normal. Negative for fracture or focal  lesion. Sinuses/Orbits: No acute finding. Other: None. CT CERVICAL SPINE FINDINGS Alignment: Mild loss of the normal cervical lordosis is noted. Skull base and vertebrae: 7 cervical segments are well visualized. Vertebral body height is well maintained. Disc space narrowing is noted at C5-6 and C6-7 with associated osteophytic changes. Multilevel facet hypertrophic changes are seen. No acute fracture or acute facet abnormality is noted. The odontoid is within normal limits. Soft tissues and spinal canal: Within normal limits. Disc levels: Disc space narrowing at C5-6 and C6-7 as described above. Upper chest: Within normal limits. IMPRESSION: CT of the head: Atrophic changes without acute abnormality. CT of the cervical spine: Multilevel degenerative change without acute abnormality. Electronically Signed   By: Inez Catalina M.D.   On: 09/02/2016 17:43   Ct Cervical Spine Wo Contrast  Result Date: 09/02/2016 CLINICAL DATA:  Recent fall with headaches and neck pain, initial encounter EXAM: CT HEAD WITHOUT CONTRAST CT CERVICAL SPINE WITHOUT CONTRAST TECHNIQUE: Multidetector CT imaging of the head and cervical spine was performed following the standard protocol without intravenous contrast. Multiplanar CT image reconstructions of the cervical spine were also generated. COMPARISON:  10/28/2015 FINDINGS: CT HEAD FINDINGS Brain: No evidence of acute infarction, hemorrhage, hydrocephalus, extra-axial collection or mass lesion/mass effect.Mild atrophic changes are noted. Vascular: No hyperdense vessel or unexpected calcification. Skull: Normal. Negative for fracture or focal lesion. Sinuses/Orbits: No acute finding. Other: None. CT CERVICAL SPINE FINDINGS Alignment: Mild loss of the normal cervical lordosis is noted. Skull base and vertebrae: 7 cervical segments are well visualized. Vertebral body height is well maintained. Disc space narrowing is noted at C5-6 and C6-7 with associated osteophytic changes. Multilevel  facet hypertrophic changes are seen. No acute fracture or acute  facet abnormality is noted. The odontoid is within normal limits. Soft tissues and spinal canal: Within normal limits. Disc levels: Disc space narrowing at C5-6 and C6-7 as described above. Upper chest: Within normal limits. IMPRESSION: CT of the head: Atrophic changes without acute abnormality. CT of the cervical spine: Multilevel degenerative change without acute abnormality. Electronically Signed   By: Inez Catalina M.D.   On: 09/02/2016 17:43   Ct Femur Left W Contrast  Result Date: 09/02/2016 CLINICAL DATA:  Large bruise in the left anterior thigh after fall. EXAM: CT OF THE LOWER RIGHT EXTREMITY WITH CONTRAST TECHNIQUE: Multidetector CT imaging of the lower right extremity was performed according to the standard protocol following intravenous contrast administration. COMPARISON:  Same date left hip radiographs CONTRAST:  23mL ISOVUE-300 IOPAMIDOL (ISOVUE-300) INJECTION 61% FINDINGS: Bones/Joint/Cartilage Intact left femoral nail fixation of the left hip. The adjacent pubic rami, hip joint and femoral shaft appear intact. There is osteoarthritic joint space narrowing of the femorotibial compartment more so along the medial aspect with chondrocalcinosis and subchondral cystic change of the tibial plateau and femoral condyles. Small joint effusion is noted of the knee. Small popliteal cyst is also present. Ligaments Suboptimally assessed by CT. Muscles and Tendons Large intramuscular hematoma with small focus of active hemorrhage, series 4, image 173 involving the left vastus lateralis muscle. This measures approximately 23.9 x 7.7 x 4.8 cm along the anterior aspect of the left thigh in CC by transverse by AP dimension. Soft tissues Mild soft tissue contusion along the lateral aspect of the left thigh. IMPRESSION: 1. Intramuscular hematoma with small focus of active hemorrhage within the left vastus lateralis muscle measuring 23.9 x 7.7 x 4.8 cm.  2. Intact left femoral nail fixation of the hip. Osteoarthritis of the left knee. No fracture is identified or dislocation noted. Electronically Signed   By: Ashley Royalty M.D.   On: 09/02/2016 19:42   Dg Femur Min 2 Views Left  Result Date: 09/02/2016 CLINICAL DATA:  Trip and fall yesterday with left leg pain, initial encounter EXAM: LEFT FEMUR 2 VIEWS COMPARISON:  None. FINDINGS: Proximal medullary rod is noted on the left with fixation screw traversing the femoral neck. Old healed fracture of the proximal femur is noted. Pelvic ring as visualized is within normal limits. No loosening is seen. No new fracture is noted. Diffuse vascular calcifications are seen. No soft tissue abnormality is noted. IMPRESSION: Chronic changes without acute abnormality. Electronically Signed   By: Inez Catalina M.D.   On: 09/02/2016 17:46    EKG:   Orders placed or performed during the hospital encounter of 09/02/16  . ED EKG  . 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  #1 left thigh hematoma-appreciate orthopedic consult. -Continue with compression bandage. No active bleeding noted at this time. -No evidence of any compartment syndrome. -Conservative management recommended. Hold eliquis at this time. - hb slight drop-? Dilutional, monitor again  #2 atrial fibrillation-rate well controlled at this time. Continue amiodarone. Eliquis is on hold at this time. Patient is status post TAVR and check anticoagulation status for the valve replacement surgery at this time -With her multiple fall history, not sure if she is a good candidate for anticoagulation.  #3 hyperlipidemia-continue Zocor   #4 Chest pain- likely rib  fracture- ordered rib series Pain meds adjusted  #5  DVT prophylaxis-Ted's and SCDs  #6 UTI- levaquin  Physical therapy consult.- recommended rehab- re-eval tomorrow   All the records are reviewed and case discussed with Care Management/Social Workerr. Management plans discussed with the patient, family and they are in agreement.  CODE STATUS: DNR  TOTAL TIME TAKING CARE OF THIS PATIENT: 36 minutes.   POSSIBLE D/C IN 1-2 DAYS, DEPENDING ON CLINICAL CONDITION.   Gladstone Lighter M.D on 09/04/2016 at 1:26 PM  Between 7am to 6pm - Pager - [phone]  After 6pm go to [email or web address] - password [email or web address] [place] [facility]  Office  [phone]  CC: Primary care physician; [name], MD

## 2016-09-04 NOTE — Clinical Social Work Placement (Signed)
   CLINICAL SOCIAL WORK PLACEMENT  NOTE  Date:  09/04/2016  Patient Details  Name: Caroline Aguilar MRN: [id number] Date of Birth: 08/10/1925  Clinical Social Work is seeking post-discharge placement for this patient at the [place] level of care (*CSW will initial, date and re-position this form in  chart as items are completed):  Yes   Patient/family provided with [place] Work Department's list of facilities offering this level of care within the geographic area requested by the patient (or if unable, by the patient's family).  Yes   Patient/family informed of their freedom to choose among providers that offer the needed level of care, that participate in Medicare, Medicaid or managed care program needed by the patient, have an available bed and are willing to accept the patient.  Yes   Patient/family informed of [facility]'s ownership interest in [facility] and [facility], as well as of the fact that they are under no obligation to receive care at these facilities.  PASRR submitted to EDS on       PASRR number received on       Existing PASRR number confirmed on 09/04/16     FL2 transmitted to all facilities in geographic area requested by pt/family on 09/04/16     FL2 transmitted to all facilities within larger geographic area on       Patient informed that his/her managed care company has contracts with or will negotiate with certain facilities, including the following:            Patient/family informed of bed offers received.  Patient chooses bed at       Physician recommends and patient chooses bed at      Patient to be transferred to   on  .  Patient to be transferred to facility by       Patient family notified on   of transfer.  Name of family member notified:        PHYSICIAN       Additional Comment:    _______________________________________________ Zavannah Deblois, Veronia Beets, LCSW 09/04/2016, 2:52 PM

## 2016-09-04 NOTE — Evaluation (Signed)
Physical Therapy Evaluation Patient Details Name: Caroline Aguilar MRN: [id number] DOB: 08-23-1925 Today's Date: 09/04/2016   History of Present Illness  Pt is an 83 y.o. female presenting to hospital s/p fall with L thigh pain and inability to walk (tripped on shower chair and fell into bathtub).  Imaging showing intramuscular hematoma with small focus of active hemorrhage w/in L vastus lateralis muscle.  Currently no concern for compartment syndrome or expanding hematoma.  PMH includes a-fib, s/p TAVR, angina pectoris, colon CA, B hip surgeries, aortic stenosis.  Clinical Impression  Prior to hospital admission, pt was ambulating with RW modified independently.  Pt lives alone in 1 level home with 4 steps to enter.  Pt pre-medicated with pain meds for PT session.  Currently pt is CGA supine to sit, min assist to stand with RW, and CGA to take a few steps bed to recliner with RW.  Distance ambulated limited d/t 8-9/10 L thigh and L trunk pain with activity (compared to minimal pain at rest beginning of session).  MD present during end of session and discussing pain management.  Pt would benefit from skilled PT to address noted impairments and functional limitations (see below for any additional details).  Upon hospital discharge, currently recommend pt discharge to [facility].  Will continue to monitor pt's pain control with functional mobility and progress pt as able.    Follow Up Recommendations SNF    Equipment Recommendations  Rolling walker with 5" wheels    Recommendations for Other Services       Precautions / Restrictions Precautions Precautions: Fall Restrictions Weight Bearing Restrictions: Yes LLE Weight Bearing: Weight bearing as tolerated Other Position/Activity Restrictions: [place] per ortho note      Mobility  Bed Mobility Overal bed mobility: Needs Assistance Bed Mobility: Supine to Sit     Supine to sit: HOB elevated;Min guard     General bed mobility comments:  significant increased time and effort to perform on own (d/t L thigh pain); CGA L LE for safety d/t pain  Transfers Overall transfer level: Needs assistance Equipment used: Rolling walker (2 wheeled) Transfers: Sit to/from Stand Sit to Stand: Min assist         General transfer comment: increased effort to stand and come to full upright position with vc's  Ambulation/Gait Ambulation/Gait assistance: Min guard Ambulation Distance (Feet): 3 Feet (bed to recliner) Assistive device: Rolling walker (2 wheeled)   Gait velocity: decreased   General Gait Details: antalgic; decreased stance time L LE; limited distance d/t L thigh and L side pain (8-9/10)  Stairs            Wheelchair Mobility    Modified Rankin (Stroke Patients Only)       Balance Overall balance assessment: History of Falls;Needs assistance Sitting-balance support: No upper extremity supported;Feet supported Sitting balance-Leahy Scale: Good Sitting balance - Comments: reaching within BOS   Standing balance support: Bilateral upper extremity supported (on RW) Standing balance-Leahy Scale: Fair Standing balance comment: static standing                             Pertinent Vitals/Pain Pain Assessment: 0-10 Pain Score: 9  Pain Location: L thigh and L side of trunk with movement/activity Pain Descriptors / Indicators: Cramping;Sore;Tender Pain Intervention(s): Limited activity within patient's tolerance;Monitored during session;Premedicated before session;Repositioned  Vitals (HR and O2 on room air) stable and WFL throughout treatment session.    Home Living Family/patient expects to  be discharged to:: Private residence Living Arrangements: Alone Available Help at Discharge: Family Type of Home: House Home Access: Stairs to enter Entrance Stairs-Rails: Right Entrance Stairs-Number of Steps: 4 Home Layout: One level Home Equipment: [place] - 2 wheels;Cane - single point;Bedside  commode;Tub bench;Other (comment) (lift chair)      Prior Function Level of Independence: Independent with assistive device(s)         Comments: Pt ambulating with RW.     Hand Dominance        Extremity/Trunk Assessment   Upper Extremity Assessment Upper Extremity Assessment: Generalized weakness    Lower Extremity Assessment Lower Extremity Assessment:  (R LE generalized weakness; L hip flexion 2/5; L knee flexion/extension 2+/5; L DF at least 3+/5 (L LE testing limited d/t L thigh pain))       Communication   Communication: HOH  Cognition Arousal/Alertness: Awake/alert Behavior During Therapy: WFL for tasks assessed/performed Overall Cognitive Status: Within Functional Limits for tasks assessed                                        General Comments General comments (skin integrity, edema, etc.): L thigh ace wrap in place.  Nursing cleared pt for participation in physical therapy.  Pt agreeable to PT session.    Exercises     Assessment/Plan    PT Assessment Patient needs continued PT services  PT Problem List Decreased strength;Decreased activity tolerance;Decreased balance;Decreased mobility;Pain       PT Treatment Interventions DME instruction;Gait training;Stair training;Functional mobility training;Therapeutic activities;Therapeutic exercise;Balance training;Patient/family education    PT Goals (Current goals can be found in the Care Plan section)  Acute Rehab PT Goals Patient Stated Goal: to go home if possible PT Goal Formulation: With patient Time For Goal Achievement: 09/18/16 Potential to Achieve Goals: Fair    Frequency Min 2X/week   Barriers to discharge Decreased caregiver support      Co-evaluation               AM-PAC PT "6 Clicks" Daily Activity  Outcome Measure Difficulty turning over in bed (including adjusting bedclothes, sheets and blankets)?: A Lot Difficulty moving from lying on back to sitting on the  side of the bed? : A Lot Difficulty sitting down on and standing up from a chair with arms (e.g., wheelchair, bedside commode, etc,.)?: Total Help needed moving to and from a bed to chair (including a wheelchair)?: A Little Help needed walking in hospital room?: A Little Help needed climbing 3-5 steps with a railing? : A Lot 6 Click Score: 13    End of Session Equipment Utilized During Treatment: Gait belt Activity Tolerance: Patient limited by pain Patient left: in chair;with call bell/phone within reach;with chair alarm set;with SCD's reapplied (B heels elevated via pillow; MD Tressia Miners present) Nurse Communication: Mobility status;Precautions;Weight bearing status (Pt's pain status) PT Visit Diagnosis: History of falling (Z91.81);Difficulty in walking, not elsewhere classified (R26.2);Pain Pain - Right/Left: Left Pain - part of body: Leg    Time: 9150-5697 PT Time Calculation (min) (ACUTE ONLY): 29 min   Charges:   PT Evaluation $PT Eval Low Complexity: 1 Procedure     PT G CodesLeitha Bleak, PT 09/04/16, 1:36 PM [phone]

## 2016-09-04 NOTE — Progress Notes (Signed)
Pt BP 97/35, recheck 108/45. MD Kalesetti notified. Orders received for 500 ml NS fluid bolus if Systolic BP gets below 90 systolic.

## 2016-09-04 NOTE — Clinical Social Work Note (Signed)
Clinical Social Work Assessment  Patient Details  Name: Caroline Aguilar MRN: [id number] Date of Birth: [birth year]  Date of referral:  09/04/16               Reason for consult:  Facility Placement                Permission sought to share information with:  Chartered certified accountant granted to share information::  Yes, Verbal Permission Granted  Name::      [place]::   [place]   Relationship::     Contact Information:     Housing/Transportation Living arrangements for the past 2 months:  [place] of Information:  Patient Patient Interpreter Needed:  None Criminal Activity/Legal Involvement Pertinent to Current Situation/Hospitalization:  No - Comment as needed Significant Relationships:  Adult Children Lives with:  Self Do you feel safe going back to the place where you live?  Yes Need for family participation in patient care:  Yes (Comment)  Care giving concerns: Patient lives alone in [place].    Social Worker assessment / plan:  Holiday representative (CSW) met with patient alone at bedside to discuss D/C plan. CSW reviewed chart and noted that PT is recommending SNF. Patient was alert and oriented X4 and was laying in the bed. CSW introduced self and explained role of CSW department. Patient reported that she lives in [place] alone and has 2 adult children. Per patient she has had 2 children pass away. Patient reported that her son Edd Arbour and his wife live right down the road from her. CSW explained SNF process. Patient is agreeable to SNF search and prefers [facility]. CSW also explained that Mcarthur Rossetti will have to approve SNF. Patient verbalized her understanding. FL2 complete and faxed out. CSW will continue to follow and assist as needed.   Employment status:  Retired Nurse, adult PT Recommendations:  [place] / Referral to community resources:   [place]  Patient/Family's Response to care:  Patient is agreeable to [facility] in [place].   Patient/Family's Understanding of and Emotional Response to Diagnosis, Current Treatment, and Prognosis:  Patient was very pleasant and thanked CSW for assistance.   Emotional Assessment Appearance:  Appears stated age Attitude/Demeanor/Rapport:    Affect (typically observed):  Accepting, Adaptable, Pleasant Orientation:  Oriented to Self, Oriented to Place, Oriented to  Time, Oriented to Situation Alcohol / Substance use:  Not Applicable Psych involvement (Current and /or in the community):  No (Comment)  Discharge Needs  Concerns to be addressed:  Discharge Planning Concerns Readmission within the last 30 days:  No Current discharge risk:  Dependent with Mobility Barriers to Discharge:  Continued Medical Work up   [employer], Veronia Beets, LCSW 09/04/2016, 2:52 PM

## 2016-09-05 DIAGNOSIS — S7012XD Contusion of left thigh, subsequent encounter: Secondary | ICD-10-CM | POA: Diagnosis not present

## 2016-09-05 DIAGNOSIS — E785 Hyperlipidemia, unspecified: Secondary | ICD-10-CM | POA: Diagnosis not present

## 2016-09-05 DIAGNOSIS — I10 Essential (primary) hypertension: Secondary | ICD-10-CM | POA: Diagnosis not present

## 2016-09-05 DIAGNOSIS — Z85038 Personal history of other malignant neoplasm of large intestine: Secondary | ICD-10-CM | POA: Diagnosis not present

## 2016-09-05 DIAGNOSIS — Z7401 Bed confinement status: Secondary | ICD-10-CM | POA: Diagnosis not present

## 2016-09-05 DIAGNOSIS — H409 Unspecified glaucoma: Secondary | ICD-10-CM | POA: Diagnosis not present

## 2016-09-05 DIAGNOSIS — R296 Repeated falls: Secondary | ICD-10-CM | POA: Diagnosis not present

## 2016-09-05 DIAGNOSIS — R52 Pain, unspecified: Secondary | ICD-10-CM | POA: Diagnosis not present

## 2016-09-05 DIAGNOSIS — G500 Trigeminal neuralgia: Secondary | ICD-10-CM | POA: Diagnosis not present

## 2016-09-05 DIAGNOSIS — W19XXXD Unspecified fall, subsequent encounter: Secondary | ICD-10-CM | POA: Diagnosis not present

## 2016-09-05 DIAGNOSIS — I4891 Unspecified atrial fibrillation: Secondary | ICD-10-CM | POA: Diagnosis not present

## 2016-09-05 DIAGNOSIS — S7010XA Contusion of unspecified thigh, initial encounter: Secondary | ICD-10-CM | POA: Diagnosis not present

## 2016-09-05 DIAGNOSIS — M79605 Pain in left leg: Secondary | ICD-10-CM | POA: Diagnosis not present

## 2016-09-05 DIAGNOSIS — I480 Paroxysmal atrial fibrillation: Secondary | ICD-10-CM | POA: Diagnosis not present

## 2016-09-05 DIAGNOSIS — I2510 Atherosclerotic heart disease of native coronary artery without angina pectoris: Secondary | ICD-10-CM | POA: Diagnosis not present

## 2016-09-05 DIAGNOSIS — N390 Urinary tract infection, site not specified: Secondary | ICD-10-CM | POA: Diagnosis not present

## 2016-09-05 DIAGNOSIS — T148XXA Other injury of unspecified body region, initial encounter: Secondary | ICD-10-CM | POA: Diagnosis not present

## 2016-09-05 DIAGNOSIS — G629 Polyneuropathy, unspecified: Secondary | ICD-10-CM | POA: Diagnosis not present

## 2016-09-05 DIAGNOSIS — I350 Nonrheumatic aortic (valve) stenosis: Secondary | ICD-10-CM | POA: Diagnosis not present

## 2016-09-05 LAB — BASIC METABOLIC PANEL
Anion gap: 5 (ref 5–15)
BUN: 16 mg/dL (ref 6–20)
CHLORIDE: 98 mmol/L — AB (ref 101–111)
CO2: 29 mmol/L (ref 22–32)
Calcium: 8.2 mg/dL — ABNORMAL LOW (ref 8.9–10.3)
Creatinine, Ser: 0.76 mg/dL (ref 0.44–1.00)
GFR calc Af Amer: >60 mL/min (ref >60)
GFR calc non Af Amer: >60 mL/min (ref >60)
Glucose, Bld: 106 mg/dL — ABNORMAL HIGH (ref 65–99)
POTASSIUM: 4.6 mmol/L (ref 3.5–5.1)
SODIUM: 132 mmol/L — AB (ref 135–145)

## 2016-09-05 LAB — CBC
HEMATOCRIT: 31.1 % — AB (ref 35.0–47.0)
HEMOGLOBIN: 10.5 g/dL — AB (ref 12.0–16.0)
MCH: 28.8 pg (ref 26.0–34.0)
MCHC: 33.6 g/dL (ref 32.0–36.0)
MCV: 85.7 fL (ref 80.0–100.0)
Platelets: 151 10*3/uL (ref 150–440)
RBC: 3.63 MIL/uL — AB (ref 3.80–5.20)
RDW: 13.4 % (ref 11.5–14.5)
WBC: 6.8 10*3/uL (ref 3.6–11.0)

## 2016-09-05 MED ORDER — METAXALONE 400 MG PO TABS: 400.0000 mg | ORAL_TABLET | Freq: Two times a day (BID) | ORAL | 0 refills | Status: DC

## 2016-09-05 MED ORDER — POLYETHYLENE GLYCOL 3350 17 G PO PACK: 17.0000 g | PACK | Freq: Every day | ORAL | 0 refills | Status: DC | PRN

## 2016-09-05 MED ORDER — POLYETHYLENE GLYCOL 3350 17 G PO PACK
17.0000 g | PACK | Freq: Every day | ORAL | Status: DC | PRN
  Administered 2016-09-05: 17 g via ORAL
  Filled 2016-09-05: qty 1

## 2016-09-05 MED ORDER — LEVOFLOXACIN 250 MG PO TABS: 250.0000 mg | ORAL_TABLET | Freq: Every day | ORAL | 0 refills | Status: DC

## 2016-09-05 MED ORDER — HYDROCODONE-ACETAMINOPHEN 5-325 MG PO TABS: 1.0000 | ORAL_TABLET | ORAL | 0 refills | Status: DC | PRN

## 2016-09-05 MED ORDER — SENNOSIDES-DOCUSATE SODIUM 8.6-50 MG PO TABS
2.0000 | ORAL_TABLET | Freq: Two times a day (BID) | ORAL | Status: DC
  Administered 2016-09-05: 2 via ORAL
  Filled 2016-09-05: qty 2

## 2016-09-05 MED ORDER — BISACODYL 10 MG RE SUPP
10.0000 mg | Freq: Every day | RECTAL | Status: DC | PRN
  Administered 2016-09-05: 10 mg via RECTAL
  Filled 2016-09-05: qty 1

## 2016-09-05 NOTE — Progress Notes (Signed)
Pharmacist - Prescriber Communication  Calcium/vitamin D administration time has been changed to 18:00 to avoid drug-drug interaction with levofloxacin.  Ulysses [place], [place].D., BCPS Clinical Pharmacist 09/05/2016 08:47

## 2016-09-05 NOTE — Progress Notes (Signed)
Pt discharged to [facility] per MD order without incident via stretcher. No change in pt from AM assessment. Pt pain controlled on d/c. Pt's family aware of transfer. Rept called to [facility].

## 2016-09-05 NOTE — NC FL2 (Signed)
[place] LEVEL OF CARE SCREENING TOOL     IDENTIFICATION  Patient Name: Caroline Aguilar Birthdate: Sep 13, 1925 Sex: female Admission Date (Current Location): 09/02/2016  [place] and [place] Number:  Engineering geologist and Address:  [facility], [address], [place], [place] [address]      Provider Number: [id number]  Attending Physician Name and Address:  Gladstone Lighter, MD  Relative Name and Phone Number:       Current Level of Care: Hospital Recommended Level of Care: [place] Prior Approval Number:    Date Approved/Denied:   PASRR Number:  ([id number] )  Discharge Plan: SNF    Current Diagnoses: Patient Active Problem List   Diagnosis Date Noted  . Thigh hematoma 09/02/2016  . Dyspnea 07/15/2015  . Chest pain at rest 07/15/2015  . Community acquired pneumonia 07/02/2015  . Hyponatremia 07/02/2015  . Atrial fibrillation with rapid ventricular response ([place]) 07/02/2015    Orientation RESPIRATION BLADDER Height & Weight     Self, Time, Situation, Place  Normal Continent Weight: 163 lb 8 oz (74.2 kg) Height:  5\' 5"  (165.1 cm)  BEHAVIORAL SYMPTOMS/MOOD NEUROLOGICAL BOWEL NUTRITION STATUS   (none)  (none) Continent Diet (Diet: Heart Healthy )  AMBULATORY STATUS COMMUNICATION OF NEEDS Skin   Extensive Assist Verbally Other (Comment) (left thigh hematoma)                       Personal Care Assistance Level of Assistance  Bathing, Feeding, Dressing Bathing Assistance: Limited assistance Feeding assistance: Independent Dressing Assistance: Limited assistance     Functional Limitations Info  Sight, Hearing, Speech Sight Info: Adequate Hearing Info: Adequate Speech Info: Adequate    SPECIAL CARE FACTORS FREQUENCY  PT (By licensed PT), OT (By licensed OT)     PT Frequency:  (5) OT Frequency:  (5)            Contractures      Additional Factors Info  Code Status, Allergies Code  Status Info:  (DNR ) Allergies Info:  (Penicillins, Azithromycin, Famciclovir)           Current Medications (09/05/2016):  This is the current hospital active medication list Current Facility-Administered Medications  Medication Dose Route Frequency Provider Last Rate Last Dose  . 0.9 %  sodium chloride infusion  250 mL Intravenous PRN Dustin Flock, MD      . alum & mag hydroxide-simeth (MAALOX/MYLANTA) 200-200-20 MG/5ML suspension 30 mL  30 mL Oral Q6H PRN Gladstone Lighter, MD   30 mL at 09/04/16 0012  . amiodarone (PACERONE) tablet 200 mg  200 mg Oral Daily Dustin Flock, MD   200 mg at 09/05/16 1104  . bisacodyl (DULCOLAX) suppository 10 mg  10 mg Rectal Daily PRN Gladstone Lighter, MD      . calcium-vitamin D (OSCAL WITH D) 500-200 MG-UNIT per tablet 1 tablet  1 tablet Oral Daily Dustin Flock, MD      . hydrALAZINE (APRESOLINE) injection 10 mg  10 mg Intravenous Q6H PRN Dustin Flock, MD   10 mg at 09/02/16 2123  . HYDROcodone-acetaminophen (NORCO/VICODIN) 5-325 MG per tablet 1 tablet  1 tablet Oral Q4H PRN Gladstone Lighter, MD   1 tablet at 09/05/16 0954  . latanoprost (XALATAN) 0.005 % ophthalmic solution 1 drop  1 drop Both Eyes QHS Dustin Flock, MD   1 drop at 09/04/16 2133  . levofloxacin (LEVAQUIN) tablet 250 mg  250 mg Oral Daily Gladstone Lighter, MD  250 mg at 09/05/16 0948  . meloxicam (MOBIC) tablet 7.5 mg  7.5 mg Oral Daily Dustin Flock, MD   7.5 mg at 09/05/16 0948  . metaxalone (SKELAXIN) tablet 400 mg  400 mg Oral TID Gladstone Lighter, MD   400 mg at 09/05/16 0949  . morphine 2 MG/ML injection 2 mg  2 mg Intravenous Q4H PRN Dustin Flock, MD      . ondansetron (ZOFRAN) tablet 4 mg  4 mg Oral Q6H PRN Dustin Flock, MD       Or  . ondansetron (ZOFRAN) injection 4 mg  4 mg Intravenous Q6H PRN Dustin Flock, MD      . pantoprazole (PROTONIX) EC tablet 40 mg  40 mg Oral Daily Dustin Flock, MD   40 mg at 09/05/16 0948  . polyethylene glycol  (MIRALAX / GLYCOLAX) packet 17 g  17 g Oral Daily PRN Gladstone Lighter, MD   17 g at 09/05/16 1104  . senna-docusate (Senokot-S) tablet 2 tablet  2 tablet Oral BID Gladstone Lighter, MD   2 tablet at 09/05/16 1104  . simvastatin (ZOCOR) tablet 20 mg  20 mg Oral QHS Dustin Flock, MD   20 mg at 09/04/16 2132  . sodium chloride flush (NS) 0.9 % injection 3 mL  3 mL Intravenous Q12H Dustin Flock, MD   3 mL at 09/05/16 0955  . sodium chloride flush (NS) 0.9 % injection 3 mL  3 mL Intravenous PRN Dustin Flock, MD      . sodium chloride tablet 1 g  1 g Oral BID WC Dustin Flock, MD   1 g at 09/05/16 0744  . timolol (TIMOPTIC) 0.5 % ophthalmic solution 1 drop  1 drop Both Eyes BID Dustin Flock, MD   1 drop at 09/05/16 5993     Discharge Medications: Please see discharge summary for a list of discharge medications.  Relevant Imaging Results:  Relevant Lab Results:   Additional Information  (SSN: [id number])  Cami Delawder, Veronia Beets, LCSW

## 2016-09-05 NOTE — Progress Notes (Signed)
Patient is medically stable for D/C to [facility] today. Per [facility] admissions coordinator at [facility] patient can come today to room 502. Per Jonelle Sidle at [facility] authorization has been received. RN will call report and arrange EMS for transport. Clinical Education officer, museum (CSW) sent D/C orders to [facility] via [facility]. Patient is aware of above. CSW contacted patient's son Edd Arbour and made him aware of above. Please reconsult if future social work needs arise. CSW signing off.   [employer], LCSW [phone]

## 2016-09-05 NOTE — Progress Notes (Signed)
Physical Therapy Treatment Patient Details Name: DAVION MEARA MRN: [id number] DOB: 1925/08/07 Today's Date: 09/05/2016    History of Present Illness Pt is an 83 y.o. female presenting to hospital s/p fall with L thigh pain and inability to walk (tripped on shower chair and fell into bathtub).  Imaging showing intramuscular hematoma with small focus of active hemorrhage w/in L vastus lateralis muscle.  Currently no concern for compartment syndrome or expanding hematoma.  PMH includes a-fib, s/p TAVR, angina pectoris, colon CA, B hip surgeries, aortic stenosis.    PT Comments    Pt able to progress to ambulating short distances in room using RW (antalgic gait noted and limited d/t L thigh pain although improved pain control noted today; pt pre-medicated with pain meds for PT session).  Pt requiring assist for L LE ex's but improved tolerance to ex's noted.  Will continue to progress pt with strengthening, balance, and increasing independence with functional mobility per pt tolerance.    Follow Up Recommendations  SNF     Equipment Recommendations  Rolling walker with 5" wheels    Recommendations for Other Services       Precautions / Restrictions Precautions Precautions: Fall Restrictions Weight Bearing Restrictions: Yes LLE Weight Bearing: Weight bearing as tolerated Other Position/Activity Restrictions: [place] per ortho note    Mobility  Bed Mobility Overal bed mobility: Needs Assistance Bed Mobility: Supine to Sit     Supine to sit: Supervision;HOB elevated     General bed mobility comments: mild increased time to perform  Transfers Overall transfer level: Needs assistance Equipment used: Rolling walker (2 wheeled) Transfers: Sit to/from Stand Sit to Stand: Min guard;Mod assist         General transfer comment: mod assist to stand from regular toilet in bathroom; CGA to stand from bed and recliner but increased time to perform, shift weight forward, and come to  upright posture  Ambulation/Gait Ambulation/Gait assistance: Min guard Ambulation Distance (Feet):  (20 feet x2 to bathroom and back) Assistive device: Rolling walker (2 wheeled)   Gait velocity: decreased   General Gait Details: antalgic; decreased stance time L LE; limited distance d/t L thigh and L side pain   Stairs            Wheelchair Mobility    Modified Rankin (Stroke Patients Only)       Balance Overall balance assessment: History of Falls;Needs assistance Sitting-balance support: No upper extremity supported;Feet supported Sitting balance-Leahy Scale: Good Sitting balance - Comments: reaching within BOS   Standing balance support: Single extremity supported Standing balance-Leahy Scale: Fair Standing balance comment: standing at toilet for hygiene and to donn new briefs CGA to min assist to steady                            Cognition Arousal/Alertness: Awake/alert Behavior During Therapy: WFL for tasks assessed/performed Overall Cognitive Status: Within Functional Limits for tasks assessed                                        Exercises Total Joint Exercises Ankle Circles/Pumps: AROM;Strengthening;Both;10 reps;Supine Quad Sets: AROM;Strengthening;Both;10 reps;Supine Heel Slides: Strengthening;Both;10 reps;Supine;Right;AROM;Left;AAROM Hip ABduction/ADduction: Strengthening;Both;10 reps;Supine;Right;AROM;Left;AAROM Vc's required for above exercise technique.   General Comments General comments (skin integrity, edema, etc.): L thigh ace wrap in place      Pertinent Vitals/Pain Pain Assessment: 0-10 Pain  Score: 6  (6/10 with activity; 4/10 at rest) Pain Location: L thigh and L side of trunk with movement/activity Pain Descriptors / Indicators: Sore;Tender Pain Intervention(s): Limited activity within patient's tolerance;Monitored during session;Premedicated before session;Repositioned  Vitals (HR and O2 on room air)  stable and WFL throughout treatment session.    Home Living                      Prior Function            PT Goals (current goals can now be found in the care plan section) Acute Rehab PT Goals Patient Stated Goal: to go home if possible PT Goal Formulation: With patient Time For Goal Achievement: 09/19/16 Potential to Achieve Goals: Fair Progress towards PT goals: Progressing toward goals    Frequency    Min 2X/week      PT Plan Current plan remains appropriate    Co-evaluation              AM-PAC PT "6 Clicks" Daily Activity  Outcome Measure  Difficulty turning over in bed (including adjusting bedclothes, sheets and blankets)?: A Little Difficulty moving from lying on back to sitting on the side of the bed? : A Little Difficulty sitting down on and standing up from a chair with arms (e.g., wheelchair, bedside commode, etc,.)?: Total Help needed moving to and from a bed to chair (including a wheelchair)?: A Little Help needed walking in hospital room?: A Little Help needed climbing 3-5 steps with a railing? : A Little 6 Click Score: 16    End of Session Equipment Utilized During Treatment: Gait belt Activity Tolerance: Patient limited by pain Patient left: in chair;with call bell/phone within reach;with chair alarm set;with SCD's reapplied (B heels elevated via pillow) Nurse Communication: Mobility status;Precautions;Weight bearing status PT Visit Diagnosis: History of falling (Z91.81);Difficulty in walking, not elsewhere classified (R26.2);Pain Pain - Right/Left: Left Pain - part of body: Leg     Time: 3833-3832 PT Time Calculation (min) (ACUTE ONLY): 29 min  Charges:  $Therapeutic Exercise: 8-22 mins $Therapeutic Activity: 8-22 mins                    G CodesLeitha Bleak, PT 09/05/16, 1:02 PM [phone]

## 2016-09-05 NOTE — Progress Notes (Signed)
Rept called to [facility]. Nonemergency transport called and scheduled.

## 2016-09-05 NOTE — Care Management Important Message (Signed)
Important Message  Patient Details  Name: Caroline Aguilar MRN: [id number] Date of Birth: 1926-03-10   Medicare Important Message Given:  Yes    Jolly Mango, RN 09/05/2016, 12:01 PM

## 2016-09-05 NOTE — Discharge Summary (Signed)
[facility] [place] NAME: Caroline Aguilar    MR#:  [id number]  DATE OF BIRTH:  07/20/1925  DATE OF ADMISSION:  09/02/2016   ADMITTING PHYSICIAN: Dustin Flock, MD  DATE OF DISCHARGE: 09/05/2016  PRIMARY CARE PHYSICIAN: [name], MD   ADMISSION DIAGNOSIS:   Chronic anticoagulation [Z79.01] Fall, initial encounter [W19.XXXA] Thigh hematoma, left, initial encounter [S70.12XA] Leg pain, diffuse, left [M79.605]  DISCHARGE DIAGNOSIS:   Active Problems:   Thigh hematoma   SECONDARY DIAGNOSIS:   Past Medical History:  Diagnosis Date  . A-fib ([place])   . Angina pectoris ([place])   . Cancer [facility])    Colon cancer  . 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  #1 left thigh hematoma-appreciate orthopedic consult. -Continue with compression bandage. No active bleeding noted at this time. -No evidence of any compartment syndrome. -Conservative management recommended. Hold eliquis at discharge. - hb slight drop-likely  Dilutional, stable now  #2 atrial fibrillation-rate well controlled at this time. Continue amiodarone. Eliquis is on hold at this time. Follow up with cardiology as outpatient within 1 week Patient is status post TAVR and check anticoagulation status for the valve replacement surgery at this time -With her multiple fall history, not sure if she is a good candidate for anticoagulation.  #3 hyperlipidemia-continue Zocor   #4 Chest pain- rib series negative for any acute rib fractures. Likely musculoskeletal pain. Much improved today. Mostly on inspiration Pain meds adjusted  #5 UTI- levaquin  Physical therapy consult.- recommended rehab- discharge  today  DISCHARGE CONDITIONS:   Guarded  CONSULTS OBTAINED:   Treatment Team:  Claud Kelp, MD Dereck Leep, MD  DRUG ALLERGIES:   Allergies  Allergen Reactions  . Penicillins Anaphylaxis and Other (See Comments)    Has patient had a PCN reaction causing immediate rash, facial/tongue/throat swelling, SOB or lightheadedness with hypotension: Yes Has patient had a PCN reaction causing severe rash involving mucus membranes or skin necrosis: No Has patient had a PCN reaction that required hospitalization No Has patient had a PCN reaction occurring within the last 10 years: No If all of the above answers are "NO", then may proceed with Cephalosporin use.  . Azithromycin Other (See Comments)    Reaction:  Unknown   . Famciclovir Other (See Comments)    Reaction:  Unknown    DISCHARGE MEDICATIONS:   Allergies as of 09/05/2016      Reactions   Penicillins Anaphylaxis, Other (See Comments)   Has patient had a PCN reaction causing immediate rash, facial/tongue/throat swelling, SOB or lightheadedness with hypotension: Yes Has patient had a PCN reaction causing severe rash involving mucus membranes or skin necrosis: No Has patient had a PCN reaction that required hospitalization No Has patient had a PCN reaction occurring within the last 10 years: No If all of the above answers are "NO", then may proceed with Cephalosporin use.   Azithromycin Other (See Comments)   Reaction:  Unknown    Famciclovir Other (See Comments)   Reaction:  Unknown       Medication List    STOP taking these medications   apixaban 2.5 MG Tabs tablet Commonly known as:  ELIQUIS   furosemide 40 MG tablet Commonly known as:  LASIX   potassium chloride 20 MEQ packet Commonly known as:  KLOR-CON   sodium chloride 1 g tablet   traMADol 50 MG tablet Commonly known as:  ULTRAM     TAKE these medications   amiodarone 200 MG tablet Commonly known as:  PACERONE Take 1 tablet (200 mg total) by mouth  daily. What changed:  how much to take  when to take this   CALCIUM 600+D 600-200 MG-UNIT Tabs Generic drug:  Calcium Carbonate-Vitamin D Take 1 tablet by mouth daily.   docusate sodium 100 MG capsule Commonly known as:  COLACE Take 100 mg by mouth daily. Reported on 08/05/2015   feeding supplement (ENSURE ENLIVE) Liqd Take 237 mLs by mouth 2 (two) times daily between meals.   HYDROcodone-acetaminophen 5-325 MG tablet Commonly known as:  NORCO/VICODIN Take 1 tablet by mouth every 4 (four) hours as needed for moderate pain.   latanoprost 0.005 % ophthalmic solution Commonly known as:  XALATAN Place 1 drop into both eyes at bedtime.   levofloxacin 250 MG tablet Commonly known as:  LEVAQUIN Take 1 tablet (250 mg total) by mouth daily. X 3 more days Start taking on:  09/06/2016   meloxicam 7.5 MG tablet Commonly known as:  MOBIC Take 7.5 mg by mouth daily as needed.   metaxalone 400 MG tablet Commonly known as:  SKELAXIN Take 1 tablet (400 mg total) by mouth 2 (two) times daily. X 5 days   pantoprazole 40 MG tablet Commonly known as:  PROTONIX Take 40 mg by mouth daily.   polyethylene glycol packet Commonly known as:  MIRALAX / GLYCOLAX Take 17 g by mouth daily as needed for mild constipation.   simvastatin 20 MG tablet Commonly known as:  ZOCOR Take 20 mg by mouth at bedtime.   timolol 0.5 % ophthalmic solution Commonly known as:  TIMOPTIC Place 1 drop into both eyes 2 (two) times daily.        DISCHARGE INSTRUCTIONS:   1. PCP follow-up in 1-2 weeks 2. Follow-up with cardiology in 2 weeks  DIET:   Regular diet  ACTIVITY:   Activity as tolerated  OXYGEN:   Home Oxygen: No.  Oxygen Delivery: room air  DISCHARGE LOCATION:   nursing home   If you experience worsening of your admission symptoms, develop shortness of breath, life threatening emergency, suicidal or homicidal thoughts you must seek medical attention immediately by calling 911 or  calling your MD immediately  if symptoms less severe.  You Must read complete instructions/literature along with all the possible adverse reactions/side effects for all the Medicines you take and that have been prescribed to you. Take any new Medicines after you have completely understood and accpet all the possible adverse reactions/side effects.   Please note  You were cared for by a hospitalist during your hospital stay. If you have any questions about your discharge medications or the care you received while you were in the hospital after you are discharged, you can call the unit and asked to speak with the hospitalist on call if the hospitalist that took care of you is not available. Once you are discharged, your primary care physician will handle any further medical issues. Please note that NO REFILLS for any discharge medications will be authorized once you are discharged, as it is imperative that you return to your primary care physician (or establish a relationship with a primary care physician if you do not have one) for your aftercare needs so that they can reassess  your need for medications and monitor your lab values.    On the day of Discharge:  VITAL SIGNS:   Blood pressure (!) 109/41, pulse 62, temperature 98.7 F (37.1 C), temperature source Oral, resp. rate 18, height 5\' 5"  (1.651 m), weight 74.2 kg (163 lb 8 oz), SpO2 94 %.  PHYSICAL EXAMINATION:   GENERAL:  83 y.o.-year-old pleasant patient lying in the bed with no acute distress.  EYES: Pupils equal, round, reactive to light and accommodation. No scleral icterus. Extraocular muscles intact.  HEENT: Head atraumatic, normocephalic. Oropharynx and nasopharynx clear.  NECK:  Supple, no jugular venous distention. No thyroid enlargement, no tenderness.  LUNGS: Normal breath sounds bilaterally, no wheezing, rales,rhonchi or crepitation. No use of accessory muscles of respiration.  Inspiratory chest pain on left lateral  chest CARDIOVASCULAR: S1, S2 normal. No  rubs, or gallops. 3/6 systolic murmur present ABDOMEN: Soft, nontender, nondistended. Bowel sounds present. No organomegaly or mass.  EXTREMITIES: No pedal edema, cyanosis, or clubbing. Left thigh with tight ACE wrap, swelling noted around it. NEUROLOGIC: Cranial nerves II through XII are intact. Muscle strength 5/5 in all extremities. Sensation intact. Gait not checked.  PSYCHIATRIC: [name].  SKIN: No obvious rash, lesion, or ulcer.  DATA REVIEW:   CBC  Recent Labs Lab 09/05/16 0433  WBC 6.8  HGB 10.5*  HCT 31.1*  PLT 151    Chemistries   Recent Labs Lab 09/02/16 1634  09/05/16 0433  NA 132*  < > 132*  K 3.6  < > 4.6  CL 95*  < > 98*  CO2 29  < > 29  GLUCOSE 153*  < > 106*  BUN 13  < > 16  CREATININE 1.01*  < > 0.76  CALCIUM 8.5*  < > 8.2*  AST 22  --   --   ALT 13*  --   --   ALKPHOS 57  --   --   BILITOT 0.9  --   --   < > = values in this interval not displayed.   Microbiology Results  Results for orders placed or performed during the hospital encounter of 07/02/15  Blood culture (routine x 2)     Status: None   Collection Time: 07/02/15  4:13 PM  Result Value Ref Range Status   Specimen Description BLOOD RT [facility]  Final   Special Requests BOTTLES DRAWN AEROBIC AND ANAEROBIC 5CC  Final   Culture NO GROWTH 5 DAYS  Final   Report Status 07/07/2015 FINAL  Final  Blood culture (routine x 2)     Status: None   Collection Time: 07/02/15  4:14 PM  Result Value Ref Range Status   Specimen Description BLOOD LT [facility]  Final   Special Requests BOTTLES DRAWN AEROBIC AND ANAEROBIC AER9CC [facility]  Final   Culture NO GROWTH 5 DAYS  Final   Report Status 07/07/2015 FINAL  Final  Urine culture     Status: None   Collection Time: 07/02/15  6:24 PM  Result Value Ref Range Status   Specimen Description URINE, RANDOM  Final   Special Requests NONE  Final   Culture >=100,000 COLONIES/mL ESCHERICHIA COLI   Final   Report Status 07/05/2015 FINAL  Final   Organism ID, Bacteria ESCHERICHIA COLI  Final      Susceptibility   Escherichia coli - MIC*    AMPICILLIN >=32 RESISTANT Resistant     CEFAZOLIN <=4 SENSITIVE Sensitive     CEFTRIAXONE <=  1 SENSITIVE Sensitive     CIPROFLOXACIN >=4 RESISTANT Resistant     GENTAMICIN <=1 SENSITIVE Sensitive     IMIPENEM <=0.25 SENSITIVE Sensitive     NITROFURANTOIN <=16 SENSITIVE Sensitive     TRIMETH/SULFA >=320 RESISTANT Resistant     AMPICILLIN/SULBACTAM 16 INTERMEDIATE Intermediate     PIP/TAZO <=4 SENSITIVE Sensitive     Extended ESBL NEGATIVE Sensitive     * >=100,000 COLONIES/mL ESCHERICHIA COLI  Culture, body fluid-bottle     Status: None   Collection Time: 07/05/15 10:05 AM  Result Value Ref Range Status   Specimen Description PLEURAL  Final   Special Requests NONE  Final   Culture NO GROWTH 5 DAYS  Final   Report Status 07/10/2015 FINAL  Final    RADIOLOGY:  Dg Ribs Bilateral  Result Date: 09/04/2016 CLINICAL DATA:  Patient fell in bathtub.  Pain. EXAM: BILATERAL RIBS - 3+ VIEW COMPARISON:  None. FINDINGS: No acute fracture or other bone lesions are seen involving the ribs. There are some chronic healed LEFT rib fractures. Vascular calcification. Previous vertebral augmentation. IMPRESSION: No acute rib fractures are evident. Electronically Signed   By: Staci Righter M.D.   On: 09/04/2016 14:32     Management plans discussed with the patient, family and they are in agreement.  CODE STATUS:     Code Status Orders        Start     Ordered   09/02/16 2216  Do not attempt resuscitation (DNR)  Continuous    Question Answer Comment  In the event of cardiac or respiratory ARREST Do not call a "code blue"   In the event of cardiac or respiratory ARREST Do not perform Intubation, CPR, defibrillation or ACLS   In the event of cardiac or respiratory ARREST Use medication by any route, position, wound care, and other measures to relive pain  and suffering. May use oxygen, suction and manual treatment of airway obstruction as needed for comfort.      09/02/16 2215    Code Status History    Date Active Date Inactive Code Status Order ID Comments User Context   07/15/2015  4:39 AM 07/20/2015  4:00 PM Full Code [id number]  Saundra Shelling, MD ED   07/02/2015  7:03 PM 07/09/2015  6:12 PM Full Code [id number]  Hower, Aaron Mose, MD ED   06/19/2015  3:15 AM 06/20/2015  4:31 PM Full Code [id number]  Saundra Shelling, MD Inpatient    Advance Directive Documentation     Most Recent Value  Type of Advance Directive  Healthcare Power of Attorney  Pre-existing out of facility DNR order (yellow form or pink MOST form)  -  "MOST" Form in Place?  -      TOTAL TIME TAKING CARE OF THIS PATIENT: 37 minutes.    Gladstone Lighter M.D on 09/05/2016 at 11:57 AM  Between 7am to 6pm - Pager - [phone]  After 6pm go to [email or web address] - Proofreader  [facility]  Office  [phone]  CC: Primary care physician; [name], MD   Note: This dictation was prepared with Dragon dictation along with smaller phrase technology. Any transcriptional errors that result from this process are unintentional.

## 2016-09-05 NOTE — Clinical Social Work Placement (Signed)
   CLINICAL SOCIAL WORK PLACEMENT  NOTE  Date:  09/05/2016  Patient Details  Name: Caroline Aguilar MRN: [id number] Date of Birth: 1926/01/09  Clinical Social Work is seeking post-discharge placement for this patient at the [place] level of care (*CSW will initial, date and re-position this form in  chart as items are completed):  Yes   Patient/family provided with [facility] Work Department's list of facilities offering this level of care within the geographic area requested by the patient (or if unable, by the patient's family).  Yes   Patient/family informed of their freedom to choose among providers that offer the needed level of care, that participate in Medicare, Medicaid or managed care program needed by the patient, have an available bed and are willing to accept the patient.  Yes   Patient/family informed of [facility]'s ownership interest in [facility] and [facility], as well as of the fact that they are under no obligation to receive care at these facilities.  PASRR submitted to EDS on       PASRR number received on       Existing PASRR number confirmed on 09/04/16     FL2 transmitted to all facilities in geographic area requested by pt/family on 09/04/16     FL2 transmitted to all facilities within larger geographic area on       Patient informed that his/her managed care company has contracts with or will negotiate with certain facilities, including the following:        Yes   Patient/family informed of bed offers received.  Patient chooses bed at  [facility] )     Physician recommends and patient chooses bed at      Patient to be transferred to  [facility] ) on 09/05/16.  Patient to be transferred to facility by  [facility] )     Patient family notified on 09/05/16 of transfer.  Name of family member notified:   (Patient's son Edd Arbour is aware of D/C today. )     PHYSICIAN       Additional  Comment:    _______________________________________________ Zaraya Delauder, Veronia Beets, LCSW 09/05/2016, 2:06 PM

## 2016-09-05 NOTE — Progress Notes (Signed)
Per Tiffany admissions coordinator at [facility] authorization has been received. Clinical Social Worker (CSW) met with patient and made her aware of above. Patient was talking to her daughter Josephina Shih on the phone while CSW was in the room. CSW got on the phone with patient's daughter and made her aware of above. Plan is for patient to D/C to [facility] when medically stable. CSW will continue to follow and assist as needed.   [employer], LCSW [phone]

## 2016-09-06 DIAGNOSIS — R296 Repeated falls: Secondary | ICD-10-CM | POA: Diagnosis not present

## 2016-09-06 DIAGNOSIS — I350 Nonrheumatic aortic (valve) stenosis: Secondary | ICD-10-CM | POA: Diagnosis not present

## 2016-09-06 DIAGNOSIS — I480 Paroxysmal atrial fibrillation: Secondary | ICD-10-CM | POA: Diagnosis not present

## 2016-09-06 DIAGNOSIS — I2510 Atherosclerotic heart disease of native coronary artery without angina pectoris: Secondary | ICD-10-CM | POA: Diagnosis not present

## 2016-09-07 DIAGNOSIS — I4891 Unspecified atrial fibrillation: Secondary | ICD-10-CM | POA: Diagnosis not present

## 2016-09-07 DIAGNOSIS — T148XXA Other injury of unspecified body region, initial encounter: Secondary | ICD-10-CM | POA: Diagnosis not present

## 2016-09-07 DIAGNOSIS — R52 Pain, unspecified: Secondary | ICD-10-CM | POA: Diagnosis not present

## 2016-09-07 DIAGNOSIS — N390 Urinary tract infection, site not specified: Secondary | ICD-10-CM | POA: Diagnosis not present

## 2016-09-14 ENCOUNTER — Other Ambulatory Visit: Payer: Self-pay | Admitting: *Deleted

## 2016-09-14 DIAGNOSIS — R52 Pain, unspecified: Secondary | ICD-10-CM | POA: Diagnosis not present

## 2016-09-14 DIAGNOSIS — N390 Urinary tract infection, site not specified: Secondary | ICD-10-CM | POA: Diagnosis not present

## 2016-09-14 DIAGNOSIS — I4891 Unspecified atrial fibrillation: Secondary | ICD-10-CM | POA: Diagnosis not present

## 2016-09-14 DIAGNOSIS — T148XXA Other injury of unspecified body region, initial encounter: Secondary | ICD-10-CM | POA: Diagnosis not present

## 2016-09-14 NOTE — Patient Outreach (Signed)
[facility]) Care Management  09/14/2016  Caroline Aguilar 01-15-1926 [id number]   Met with patient and her daughter Cisse at facility. She states that she is going to [place] tomorrow with daughter.  She usually lives alone but feels she needs extra support at this time.  Patient has history of falls, AFIB and pneumonia. Patient reports she is sore in abdominal are and is asking about muscle relaxers. RNCM referred her to MD/NP at facility, they will be assessing patient for discharge and will review medications at that time.   RNCM reviewed [facility] program. Patient accepted brochure and magnet. RNCM reviewed how to get in touch with [facility] care management.   Plan to sign off at this time as patient will discharge home with daughter to [place], no [facility] care management needs assessed at this time.  SW at facility states she will not be setting up any home care due to patient no needs and that she will be in [place].  Royetta Crochet. Laymond Purser, RN, BSN, [place] [phone]) Business Cell  [phone]) Toll Free Office

## 2016-10-03 DIAGNOSIS — S7012XA Contusion of left thigh, initial encounter: Secondary | ICD-10-CM | POA: Diagnosis not present

## 2016-10-03 DIAGNOSIS — D500 Iron deficiency anemia secondary to blood loss (chronic): Secondary | ICD-10-CM | POA: Diagnosis not present

## 2016-10-17 DIAGNOSIS — I480 Paroxysmal atrial fibrillation: Secondary | ICD-10-CM | POA: Diagnosis not present

## 2016-10-17 DIAGNOSIS — E782 Mixed hyperlipidemia: Secondary | ICD-10-CM | POA: Diagnosis not present

## 2016-10-17 DIAGNOSIS — G609 Hereditary and idiopathic neuropathy, unspecified: Secondary | ICD-10-CM | POA: Diagnosis not present

## 2016-10-17 DIAGNOSIS — M818 Other osteoporosis without current pathological fracture: Secondary | ICD-10-CM | POA: Diagnosis not present

## 2016-10-23 DIAGNOSIS — R531 Weakness: Secondary | ICD-10-CM | POA: Diagnosis not present

## 2016-10-23 DIAGNOSIS — I5032 Chronic diastolic (congestive) heart failure: Secondary | ICD-10-CM | POA: Diagnosis not present

## 2016-10-23 DIAGNOSIS — R0602 Shortness of breath: Secondary | ICD-10-CM | POA: Diagnosis not present

## 2016-10-23 DIAGNOSIS — I4891 Unspecified atrial fibrillation: Secondary | ICD-10-CM | POA: Diagnosis not present

## 2016-10-23 DIAGNOSIS — Z952 Presence of prosthetic heart valve: Secondary | ICD-10-CM | POA: Diagnosis not present

## 2016-10-23 DIAGNOSIS — R600 Localized edema: Secondary | ICD-10-CM | POA: Diagnosis not present

## 2016-10-23 DIAGNOSIS — R5383 Other fatigue: Secondary | ICD-10-CM | POA: Diagnosis not present

## 2016-10-23 DIAGNOSIS — E871 Hypo-osmolality and hyponatremia: Secondary | ICD-10-CM | POA: Diagnosis not present

## 2016-10-23 DIAGNOSIS — I6523 Occlusion and stenosis of bilateral carotid arteries: Secondary | ICD-10-CM | POA: Diagnosis not present

## 2016-10-24 DIAGNOSIS — Z0000 Encounter for general adult medical examination without abnormal findings: Secondary | ICD-10-CM | POA: Diagnosis not present

## 2016-10-24 DIAGNOSIS — I480 Paroxysmal atrial fibrillation: Secondary | ICD-10-CM | POA: Diagnosis not present

## 2016-10-24 DIAGNOSIS — E538 Deficiency of other specified B group vitamins: Secondary | ICD-10-CM | POA: Diagnosis not present

## 2016-10-24 DIAGNOSIS — C186 Malignant neoplasm of descending colon: Secondary | ICD-10-CM | POA: Diagnosis not present

## 2016-10-24 DIAGNOSIS — G620 Drug-induced polyneuropathy: Secondary | ICD-10-CM | POA: Diagnosis not present

## 2016-10-24 DIAGNOSIS — I5032 Chronic diastolic (congestive) heart failure: Secondary | ICD-10-CM | POA: Diagnosis not present

## 2016-11-03 DIAGNOSIS — I739 Peripheral vascular disease, unspecified: Secondary | ICD-10-CM | POA: Diagnosis not present

## 2016-11-03 DIAGNOSIS — E871 Hypo-osmolality and hyponatremia: Secondary | ICD-10-CM | POA: Diagnosis not present

## 2016-11-03 DIAGNOSIS — Z952 Presence of prosthetic heart valve: Secondary | ICD-10-CM | POA: Diagnosis not present

## 2016-11-03 DIAGNOSIS — Z09 Encounter for follow-up examination after completed treatment for conditions other than malignant neoplasm: Secondary | ICD-10-CM | POA: Diagnosis not present

## 2016-11-03 DIAGNOSIS — Z9181 History of falling: Secondary | ICD-10-CM | POA: Diagnosis not present

## 2016-11-03 DIAGNOSIS — I058 Other rheumatic mitral valve diseases: Secondary | ICD-10-CM | POA: Diagnosis not present

## 2016-11-03 DIAGNOSIS — I5032 Chronic diastolic (congestive) heart failure: Secondary | ICD-10-CM | POA: Diagnosis not present

## 2016-11-03 DIAGNOSIS — I340 Nonrheumatic mitral (valve) insufficiency: Secondary | ICD-10-CM | POA: Diagnosis not present

## 2016-11-03 DIAGNOSIS — I350 Nonrheumatic aortic (valve) stenosis: Secondary | ICD-10-CM | POA: Diagnosis not present

## 2016-11-03 DIAGNOSIS — I2510 Atherosclerotic heart disease of native coronary artery without angina pectoris: Secondary | ICD-10-CM | POA: Diagnosis not present

## 2016-11-03 DIAGNOSIS — I480 Paroxysmal atrial fibrillation: Secondary | ICD-10-CM | POA: Diagnosis not present

## 2016-11-27 DIAGNOSIS — H401111 Primary open-angle glaucoma, right eye, mild stage: Secondary | ICD-10-CM | POA: Diagnosis not present

## 2016-12-04 DIAGNOSIS — H401131 Primary open-angle glaucoma, bilateral, mild stage: Secondary | ICD-10-CM | POA: Diagnosis not present

## 2017-02-22 DIAGNOSIS — E782 Mixed hyperlipidemia: Secondary | ICD-10-CM | POA: Diagnosis not present

## 2017-02-22 DIAGNOSIS — E538 Deficiency of other specified B group vitamins: Secondary | ICD-10-CM | POA: Diagnosis not present

## 2017-02-22 DIAGNOSIS — I5032 Chronic diastolic (congestive) heart failure: Secondary | ICD-10-CM | POA: Diagnosis not present

## 2017-02-27 ENCOUNTER — Other Ambulatory Visit: Payer: Self-pay | Admitting: Internal Medicine

## 2017-02-27 DIAGNOSIS — I480 Paroxysmal atrial fibrillation: Secondary | ICD-10-CM | POA: Diagnosis not present

## 2017-02-27 DIAGNOSIS — E538 Deficiency of other specified B group vitamins: Secondary | ICD-10-CM | POA: Diagnosis not present

## 2017-02-27 DIAGNOSIS — E782 Mixed hyperlipidemia: Secondary | ICD-10-CM | POA: Diagnosis not present

## 2017-02-27 DIAGNOSIS — R55 Syncope and collapse: Secondary | ICD-10-CM | POA: Diagnosis not present

## 2017-03-02 DIAGNOSIS — R55 Syncope and collapse: Secondary | ICD-10-CM | POA: Diagnosis not present

## 2017-06-04 DIAGNOSIS — H401131 Primary open-angle glaucoma, bilateral, mild stage: Secondary | ICD-10-CM | POA: Diagnosis not present

## 2017-06-11 ENCOUNTER — Encounter: Payer: Self-pay | Admitting: Emergency Medicine

## 2017-06-11 ENCOUNTER — Emergency Department: Payer: Medicare HMO

## 2017-06-11 ENCOUNTER — Observation Stay
Admission: EM | Admit: 2017-06-11 | Discharge: 2017-06-13 | Disposition: A | Discharge status: Home Health | Payer: Medicare HMO | Attending: Specialist | Admitting: Specialist

## 2017-06-11 ENCOUNTER — Other Ambulatory Visit: Payer: Self-pay

## 2017-06-11 DIAGNOSIS — I5030 Unspecified diastolic (congestive) heart failure: Secondary | ICD-10-CM | POA: Diagnosis present

## 2017-06-11 DIAGNOSIS — R200 Anesthesia of skin: Secondary | ICD-10-CM | POA: Diagnosis present

## 2017-06-11 DIAGNOSIS — R29700 NIHSS score 0: Secondary | ICD-10-CM | POA: Diagnosis not present

## 2017-06-11 DIAGNOSIS — H409 Unspecified glaucoma: Secondary | ICD-10-CM | POA: Insufficient documentation

## 2017-06-11 DIAGNOSIS — Z85038 Personal history of other malignant neoplasm of large intestine: Secondary | ICD-10-CM | POA: Diagnosis not present

## 2017-06-11 DIAGNOSIS — Z880 Allergy status to penicillin: Secondary | ICD-10-CM | POA: Diagnosis not present

## 2017-06-11 DIAGNOSIS — K219 Gastro-esophageal reflux disease without esophagitis: Secondary | ICD-10-CM | POA: Insufficient documentation

## 2017-06-11 DIAGNOSIS — M542 Cervicalgia: Secondary | ICD-10-CM

## 2017-06-11 DIAGNOSIS — Z881 Allergy status to other antibiotic agents status: Secondary | ICD-10-CM | POA: Diagnosis not present

## 2017-06-11 DIAGNOSIS — Z7902 Long term (current) use of antithrombotics/antiplatelets: Secondary | ICD-10-CM | POA: Diagnosis not present

## 2017-06-11 DIAGNOSIS — R2689 Other abnormalities of gait and mobility: Secondary | ICD-10-CM | POA: Insufficient documentation

## 2017-06-11 DIAGNOSIS — F329 Major depressive disorder, single episode, unspecified: Secondary | ICD-10-CM | POA: Insufficient documentation

## 2017-06-11 DIAGNOSIS — W19XXXA Unspecified fall, initial encounter: Secondary | ICD-10-CM

## 2017-06-11 DIAGNOSIS — S199XXA Unspecified injury of neck, initial encounter: Secondary | ICD-10-CM | POA: Diagnosis not present

## 2017-06-11 DIAGNOSIS — S0990XA Unspecified injury of head, initial encounter: Secondary | ICD-10-CM | POA: Diagnosis not present

## 2017-06-11 DIAGNOSIS — Z66 Do not resuscitate: Secondary | ICD-10-CM | POA: Insufficient documentation

## 2017-06-11 DIAGNOSIS — M4802 Spinal stenosis, cervical region: Secondary | ICD-10-CM | POA: Insufficient documentation

## 2017-06-11 DIAGNOSIS — R29898 Other symptoms and signs involving the musculoskeletal system: Secondary | ICD-10-CM | POA: Diagnosis present

## 2017-06-11 DIAGNOSIS — Z79899 Other long term (current) drug therapy: Secondary | ICD-10-CM | POA: Insufficient documentation

## 2017-06-11 DIAGNOSIS — E785 Hyperlipidemia, unspecified: Secondary | ICD-10-CM | POA: Diagnosis not present

## 2017-06-11 DIAGNOSIS — I480 Paroxysmal atrial fibrillation: Secondary | ICD-10-CM | POA: Diagnosis present

## 2017-06-11 DIAGNOSIS — I639 Cerebral infarction, unspecified: Principal | ICD-10-CM | POA: Insufficient documentation

## 2017-06-11 DIAGNOSIS — I4891 Unspecified atrial fibrillation: Secondary | ICD-10-CM | POA: Diagnosis not present

## 2017-06-11 DIAGNOSIS — M6281 Muscle weakness (generalized): Secondary | ICD-10-CM | POA: Diagnosis not present

## 2017-06-11 HISTORY — DX: Cerebral infarction, unspecified: I63.9

## 2017-06-11 LAB — CBC WITH DIFFERENTIAL/PLATELET
BASOS ABS: 0 10*3/uL (ref 0–0.1)
Basophils Relative: 1 %
Eosinophils Absolute: 0.1 10*3/uL (ref 0–0.7)
Eosinophils Relative: 3 %
HCT: 41.9 % (ref 35.0–47.0)
HEMOGLOBIN: 13.9 g/dL (ref 12.0–16.0)
LYMPHS ABS: 0.9 10*3/uL — AB (ref 1.0–3.6)
LYMPHS PCT: 24 %
MCH: 29.3 pg (ref 26.0–34.0)
MCHC: 33.2 g/dL (ref 32.0–36.0)
MCV: 88.4 fL (ref 80.0–100.0)
Monocytes Absolute: 0.5 10*3/uL (ref 0.2–0.9)
Monocytes Relative: 13 %
NEUTROS ABS: 2.3 10*3/uL (ref 1.4–6.5)
NEUTROS PCT: 59 %
PLATELETS: 150 10*3/uL (ref 150–440)
RBC: 4.74 MIL/uL (ref 3.80–5.20)
RDW: 13.3 % (ref 11.5–14.5)
WBC: 3.8 10*3/uL (ref 3.6–11.0)

## 2017-06-11 LAB — COMPREHENSIVE METABOLIC PANEL
ALK PHOS: 70 U/L (ref 38–126)
ALT: 12 U/L — AB (ref 14–54)
AST: 22 U/L (ref 15–41)
Albumin: 3.8 g/dL (ref 3.5–5.0)
Anion gap: 9 (ref 5–15)
BUN: 16 mg/dL (ref 6–20)
CALCIUM: 8.8 mg/dL — AB (ref 8.9–10.3)
CO2: 26 mmol/L (ref 22–32)
CREATININE: 0.77 mg/dL (ref 0.44–1.00)
Chloride: 96 mmol/L — ABNORMAL LOW (ref 101–111)
GFR calc Af Amer: >60 mL/min (ref >60)
Glucose, Bld: 133 mg/dL — ABNORMAL HIGH (ref 65–99)
Potassium: 3.8 mmol/L (ref 3.5–5.1)
Sodium: 131 mmol/L — ABNORMAL LOW (ref 135–145)
Total Bilirubin: 0.6 mg/dL (ref 0.3–1.2)
Total Protein: 7.1 g/dL (ref 6.5–8.1)

## 2017-06-11 LAB — APTT: aPTT: 35 seconds (ref 24–36)

## 2017-06-11 LAB — PROTIME-INR
INR: 0.92
PROTHROMBIN TIME: 12.3 s (ref 11.4–15.2)

## 2017-06-11 MED ORDER — OXYCODONE HCL 5 MG PO TABS
2.5000 mg | ORAL_TABLET | Freq: Once | ORAL | Status: DC
Start: 2017-06-11 — End: 2017-06-13
  Filled 2017-06-11: qty 1

## 2017-06-11 MED ORDER — ASPIRIN 81 MG PO CHEW
324.0000 mg | CHEWABLE_TABLET | Freq: Once | ORAL | Status: AC
  Administered 2017-06-11: 324 mg via ORAL
  Filled 2017-06-11: qty 4

## 2017-06-11 MED ORDER — ACETAMINOPHEN 500 MG PO TABS
1000.0000 mg | ORAL_TABLET | Freq: Once | ORAL | Status: AC
  Administered 2017-06-11: 1000 mg via ORAL
  Filled 2017-06-11: qty 2

## 2017-06-11 NOTE — ED Triage Notes (Signed)
[place] last Thursday, hit the back of her head on hard wood floor.  This morning awoke and right leg was numb and now left leg is having numbness.  Also c/o neck pain.

## 2017-06-11 NOTE — ED Provider Notes (Addendum)
MRI indicates an acute stroke in the corona radiata.  Discussed with the patient and family, she reports that this morning she realized when she got out that her right leg seem like it was not working well.  This seems to correlate with her symptoms, she also has a history of A. fib and family reports she missed to Xarelto doses yesterday.  I have paged neurology to discuss, will order aspirin after stroke swallow at this time.  Discussed with Dr. Jannifer Franklin, patient will be admitted for further workup and neurology consult.       Ct Head Wo Contrast  Result Date: 06/11/2017 CLINICAL DATA:  84 year old female post fall 4 days ago. Awoke this morning with right leg numbness and now with left leg numbness. Neck pain. Initial encounter. EXAM: CT HEAD WITHOUT CONTRAST CT CERVICAL SPINE WITHOUT CONTRAST TECHNIQUE: Multidetector CT imaging of the head and cervical spine was performed following the standard protocol without intravenous contrast. Multiplanar CT image reconstructions of the cervical spine were also generated. COMPARISON:  09/02/2016 CT head and cervical spine. FINDINGS: CT HEAD FINDINGS Brain: No intracranial hemorrhage or CT evidence of large acute infarct. Chronic microvascular changes. Atrophy No intracranial mass lesion noted on this unenhanced exam. Vascular: Vascular calcifications. Skull: No skull fracture. Sinuses/Orbits: No acute orbital abnormality. Opacification right frontal sinus and ethmoid sinus air cells. Mild mucosal thickening right maxillary sinus. Other: Mastoid air cells and middle ear cavities are clear. CT CERVICAL SPINE FINDINGS Alignment: Alignment similar to prior exam. Skull base and vertebrae: No cervical spine fracture. Soft tissues and spinal canal: No abnormal prevertebral soft tissue swelling. Disc levels: Cervical spondylotic changes C3-4 thru C6-7 with mild spinal stenosis and slight cord flattening C3-4 and C5-6 level similar to prior exam. Upper chest: No worrisome  lung apical lesion. Other: Carotid bifurcation calcifications. Slightly heterogeneous thyroid gland. IMPRESSION: CT HEAD No skull fracture or intracranial hemorrhage. Chronic microvascular changes. Atrophy. Opacification right frontal sinus and ethmoid sinus air cells. Mild mucosal thickening right maxillary sinus. CT CERVICAL SPINE Alignment similar to prior exam. No cervical spine fracture or abnormal prevertebral soft tissue swelling. Cervical spondylotic changes C3-4 thru C6-7 with mild spinal stenosis and slight cord flattening C3-4 and C5-6 level similar to prior exam. Electronically Signed   By: Genia Del M.D.   On: 06/11/2017 14:57   Ct Cervical Spine Wo Contrast  Result Date: 06/11/2017 CLINICAL DATA:  84 year old female post fall 4 days ago. Awoke this morning with right leg numbness and now with left leg numbness. Neck pain. Initial encounter. EXAM: CT HEAD WITHOUT CONTRAST CT CERVICAL SPINE WITHOUT CONTRAST TECHNIQUE: Multidetector CT imaging of the head and cervical spine was performed following the standard protocol without intravenous contrast. Multiplanar CT image reconstructions of the cervical spine were also generated. COMPARISON:  09/02/2016 CT head and cervical spine. FINDINGS: CT HEAD FINDINGS Brain: No intracranial hemorrhage or CT evidence of large acute infarct. Chronic microvascular changes. Atrophy No intracranial mass lesion noted on this unenhanced exam. Vascular: Vascular calcifications. Skull: No skull fracture. Sinuses/Orbits: No acute orbital abnormality. Opacification right frontal sinus and ethmoid sinus air cells. Mild mucosal thickening right maxillary sinus. Other: Mastoid air cells and middle ear cavities are clear. CT CERVICAL SPINE FINDINGS Alignment: Alignment similar to prior exam. Skull base and vertebrae: No cervical spine fracture. Soft tissues and spinal canal: No abnormal prevertebral soft tissue swelling. Disc levels: Cervical spondylotic changes C3-4 thru  C6-7 with mild spinal stenosis and slight cord flattening C3-4 and C5-6 level  similar to prior exam. Upper chest: No worrisome lung apical lesion. Other: Carotid bifurcation calcifications. Slightly heterogeneous thyroid gland. IMPRESSION: CT HEAD No skull fracture or intracranial hemorrhage. Chronic microvascular changes. Atrophy. Opacification right frontal sinus and ethmoid sinus air cells. Mild mucosal thickening right maxillary sinus. CT CERVICAL SPINE Alignment similar to prior exam. No cervical spine fracture or abnormal prevertebral soft tissue swelling. Cervical spondylotic changes C3-4 thru C6-7 with mild spinal stenosis and slight cord flattening C3-4 and C5-6 level similar to prior exam. Electronically Signed   By: Genia Del M.D.   On: 06/11/2017 14:57   Mr Brain Wo Contrast  Result Date: 06/11/2017 CLINICAL DATA:  Recent fall. Now with neck pain and bilateral lower extremity numbness. EXAM: MRI HEAD WITHOUT CONTRAST MRI CERVICAL SPINE WITHOUT CONTRAST TECHNIQUE: Multiplanar, multiecho pulse sequences of the brain and surrounding structures, and cervical spine, to include the craniocervical junction and cervicothoracic junction, were obtained without intravenous contrast. COMPARISON:  Head CT 06/11/2017 FINDINGS: MRI HEAD FINDINGS Brain: The midline structures are normal. Small focus of abnormal diffusion weighted signal in the left corona radiata. No mass lesion, hydrocephalus, dural abnormality or extra-axial collection. Multifocal white matter hyperintensity, most commonly due to chronic ischemic microangiopathy. No age-advanced or lobar predominant atrophy. 3-5 peripherally scattered chronic micro hemorrhagic foci. Vascular: Major intracranial arterial and venous sinus flow voids are preserved. Skull: Normal marrow signal. Sinuses/Orbits: No fluid levels or advanced mucosal thickening. No mastoid or middle ear effusion. Normal orbits. MRI CERVICAL SPINE FINDINGS Alignment: Mild reversal of  normal cervical lordosis. Grade 1 retrolisthesis at C5-6 and C6-7. Vertebrae: No fracture, evidence of discitis, or bone lesion. Cord: Normal signal and morphology. Posterior Fossa, vertebral arteries, paraspinal tissues: Visualized posterior fossa is normal. Vertebral artery flow voids are preserved. No prevertebral soft tissue swelling. Disc levels: C1-C2: Normal. C2-C3: Normal disc space and facets. No spinal canal or neuroforaminal stenosis. C3-C4: Mild left neural foraminal stenosis due to uncovertebral and facet hypertrophy. C4-C5: Left-greater-than-right facet hypertrophy with mild left uncovertebral hypertrophy resulting in mild-to-moderate left neural foraminal stenosis. C5-C6: Disc space narrowing with small disc osteophyte complex moderately narrowing both neural foramina. Mild central spinal canal stenosis. C6-C7: Small disc osteophyte complex with mild bilateral foraminal narrowing. Mild central spinal canal stenosis. C7-T1: Normal disc space and facets. No spinal canal or neuroforaminal stenosis. IMPRESSION: 1. Small focus of abnormal DWI signal in the left corona radiata is consistent with an acute or early subacute infarct. No acute hemorrhage or mass effect. 2. Nonspecific 3-5 peripherally scattered foci of chronic microhemorrhage. 3. No high-grade spinal canal stenosis or cervical cord compression. 4. Multilevel mild spinal canal and mild-to-moderate neural foraminal stenosis. Electronically Signed   By: Ulyses Jarred M.D.   On: 06/11/2017 20:46   Mr Cervical Spine Wo Contrast  Result Date: 06/11/2017 CLINICAL DATA:  Recent fall. Now with neck pain and bilateral lower extremity numbness. EXAM: MRI HEAD WITHOUT CONTRAST MRI CERVICAL SPINE WITHOUT CONTRAST TECHNIQUE: Multiplanar, multiecho pulse sequences of the brain and surrounding structures, and cervical spine, to include the craniocervical junction and cervicothoracic junction, were obtained without intravenous contrast. COMPARISON:  Head CT  06/11/2017 FINDINGS: MRI HEAD FINDINGS Brain: The midline structures are normal. Small focus of abnormal diffusion weighted signal in the left corona radiata. No mass lesion, hydrocephalus, dural abnormality or extra-axial collection. Multifocal white matter hyperintensity, most commonly due to chronic ischemic microangiopathy. No age-advanced or lobar predominant atrophy. 3-5 peripherally scattered chronic micro hemorrhagic foci. Vascular: Major intracranial arterial and venous sinus flow  voids are preserved. Skull: Normal marrow signal. Sinuses/Orbits: No fluid levels or advanced mucosal thickening. No mastoid or middle ear effusion. Normal orbits. MRI CERVICAL SPINE FINDINGS Alignment: Mild reversal of normal cervical lordosis. Grade 1 retrolisthesis at C5-6 and C6-7. Vertebrae: No fracture, evidence of discitis, or bone lesion. Cord: Normal signal and morphology. Posterior Fossa, vertebral arteries, paraspinal tissues: Visualized posterior fossa is normal. Vertebral artery flow voids are preserved. No prevertebral soft tissue swelling. Disc levels: C1-C2: Normal. C2-C3: Normal disc space and facets. No spinal canal or neuroforaminal stenosis. C3-C4: Mild left neural foraminal stenosis due to uncovertebral and facet hypertrophy. C4-C5: Left-greater-than-right facet hypertrophy with mild left uncovertebral hypertrophy resulting in mild-to-moderate left neural foraminal stenosis. C5-C6: Disc space narrowing with small disc osteophyte complex moderately narrowing both neural foramina. Mild central spinal canal stenosis. C6-C7: Small disc osteophyte complex with mild bilateral foraminal narrowing. Mild central spinal canal stenosis. C7-T1: Normal disc space and facets. No spinal canal or neuroforaminal stenosis. IMPRESSION: 1. Small focus of abnormal DWI signal in the left corona radiata is consistent with an acute or early subacute infarct. No acute hemorrhage or mass effect. 2. Nonspecific 3-5 peripherally  scattered foci of chronic microhemorrhage. 3. No high-grade spinal canal stenosis or cervical cord compression. 4. Multilevel mild spinal canal and mild-to-moderate neural foraminal stenosis. Electronically Signed   By: Ulyses Jarred M.D.   On: 06/11/2017 20:46      Delman Kitten, MD 06/11/17 2245   ----------------------------------------- 10:49 PM on 06/11/2017 -----------------------------------------  EKG and reviewed and her by me at 2245 Heart rate 60 QRS 100 QTC 480 Normal sinus rhythm, no of acute ischemia.  Neurology advises hold anticoagulant (eliquis) at present (patient took 2 baby aspirin) prior, will give ASA tonight, until neurology consult tomorrow at [facility]. Discussed with Dr. Cheral Marker. Family denotes they don't think patient took eliquis at all yesterday but did this morning.   Dr. Cheral Marker:  Have neurology consult but RESTART eliquis with this mornings medications and hold further aspirin until today's inpatient neurology consult as patient already had aspirin today (06/11/17).    Delman Kitten, MD 06/12/17 [phone]

## 2017-06-11 NOTE — ED Notes (Signed)
Family at bedside. 

## 2017-06-11 NOTE — ED Notes (Signed)
Patient is resting comfortably. Family at bedside. Pt given water to drink per request

## 2017-06-11 NOTE — ED Provider Notes (Addendum)
[facility] Emergency Department Provider Note  ____________________________________________  Time seen: Approximately 5:00 PM  I have reviewed the triage vital signs and the nursing notes.   HISTORY  Chief Complaint No chief complaint on file.   HPI Caroline Aguilar is a 84 y.o. female with a history of remote colon cancer, atrial fibrillation not on blood thinners, and hyperlipidemia who presents for evaluation of bilateral leg numbness. Patient reports that 4 days ago she was in her kitchen talking on the phone and was trying to sit on a chair. The line of the chair was stuck under the table saw and patient said she fell off the chair. She fell backwards and hit her head on the ground. She reports whiplash movement of the neck. Since then she has had an occipital headache and neck pain. This morning she woke up her right leg felt numb but and heavy. She was having severe difficulty ambulating. By the time she arrived to the emergency room her left leg started to go numb as well. She denies any back pain. She is complaining of constant moderate sharp neck pain for 4 days. She denies any weakness or numbness of her upper extremities. She denies saddle anesthesia, urinary or bowel incontinence or retention. According to the patient lives alone and is able to ambulate with a walker until this am when she was unable to ambulate. She reports h/o several falls because one of her legs is shorter than the other which causes her a lot of balance issues.   Past Medical History:  Diagnosis Date  . A-fib ([place])   . Angina pectoris ([place])   . Cancer [facility])    Colon cancer  . Hyperlipidemia     Patient Active Problem List   Diagnosis Date Noted  . Thigh hematoma 09/02/2016  . Dyspnea 07/15/2015  . Chest pain at rest 07/15/2015  . Community acquired pneumonia 07/02/2015  . Hyponatremia 07/02/2015  . Atrial fibrillation with rapid ventricular response ([place]) 07/02/2015     Past Surgical History:  Procedure Laterality Date  . CARDIAC CATHETERIZATION N/A 07/19/2015   Procedure: Right and Left Heart Cath;  Surgeon: Isaias Cowman, MD;  Location: [facility];  Service: Cardiovascular;  Laterality: N/A;  . CATARACT EXTRACTION Bilateral   . COLON SURGERY    . ESOPHAGOGASTRODUODENOSCOPY N/A 07/08/2015   Procedure: ESOPHAGOGASTRODUODENOSCOPY (EGD);  Surgeon: Hulen Luster, MD;  Location: [facility] ENDOSCOPY;  Service: Endoscopy;  Laterality: N/A;  . HIP SURGERY Bilateral     Prior to Admission medications   Medication Sig Start Date End Date Taking? Authorizing Provider  amiodarone (PACERONE) 200 MG tablet Take 1 tablet (200 mg total) by mouth daily. Patient taking differently: Take 100 mg by mouth 2 (two) times daily.  07/20/15  Yes Wieting, Richard, MD  apixaban (ELIQUIS) 2.5 MG TABS tablet Take 2.5 mg by mouth 2 (two) times daily.    Yes [provider]  Calcium Carbonate-Vitamin D (CALCIUM 600+D) 600-200 MG-UNIT TABS Take 1 tablet by mouth daily.   Yes [provider]  docusate sodium (COLACE) 100 MG capsule Take 100 mg by mouth daily. Reported on 08/05/2015   Yes [provider]  latanoprost (XALATAN) 0.005 % ophthalmic solution Place 1 drop into both eyes at bedtime.   Yes [provider]  meloxicam (MOBIC) 7.5 MG tablet Take 7.5 mg by mouth daily as needed. 06/08/16  Yes [provider]  pantoprazole (PROTONIX) 40 MG tablet Take 40 mg by mouth  daily as needed.    Yes [provider]  polyethylene glycol (MIRALAX / GLYCOLAX) packet Take 17 g by mouth daily as needed for mild constipation. 09/05/16  Yes Gladstone Lighter, MD  potassium chloride SA (K-DUR,KLOR-CON) 20 MEQ tablet Take 40 mEq by mouth daily. 10/15/15  Yes [provider]  sertraline (ZOLOFT) 50 MG tablet Take 50 mg by mouth daily.   Yes [provider]  simvastatin (ZOCOR) 20 MG tablet Take 20 mg by mouth at bedtime.   Yes  [provider]  timolol (TIMOPTIC) 0.5 % ophthalmic solution Place 1 drop into both eyes 2 (two) times daily.   Yes [provider]  feeding supplement, ENSURE ENLIVE, (ENSURE ENLIVE) LIQD Take 237 mLs by mouth 2 (two) times daily between meals. 07/09/15   Epifanio Lesches, MD  HYDROcodone-acetaminophen (NORCO/VICODIN) 5-325 MG tablet Take 1 tablet by mouth every 4 (four) hours as needed for moderate pain. Patient not taking: Reported on 06/11/2017 09/05/16   Gladstone Lighter, MD  levofloxacin (LEVAQUIN) 250 MG tablet Take 1 tablet (250 mg total) by mouth daily. X 3 more days Patient not taking: Reported on 06/11/2017 09/06/16   Gladstone Lighter, MD  metaxalone (SKELAXIN) 400 MG tablet Take 1 tablet (400 mg total) by mouth 2 (two) times daily. X 5 days Patient not taking: Reported on 06/11/2017 09/05/16   Gladstone Lighter, MD    Allergies Penicillins; Azithromycin; and Famciclovir  Family History  Problem Relation Age of Onset  . Cancer Mother   . Heart failure Father     Social History Social History   Tobacco Use  . Smoking status: Never Smoker  . Smokeless tobacco: Never Used  Substance Use Topics  . Alcohol use: No  . Drug use: No    Review of Systems Constitutional: Negative for fever. Eyes: Negative for visual changes. ENT: Negative for facial injury. + neck pain Cardiovascular: Negative for chest injury. Respiratory: Negative for shortness of breath. Negative for chest wall injury. Gastrointestinal: Negative for abdominal pain or injury. Genitourinary: Negative for dysuria. Musculoskeletal: Negative for back injury, negative for arm or leg pain. Skin: Negative for laceration/abrasions. Neurological: + head injury and b/l leg numbness   ____________________________________________   PHYSICAL EXAM:  VITAL SIGNS: ED Triage Vitals [06/11/17 1355]  Enc Vitals Group     BP (!) 173/70     Pulse Rate 72     Resp 16     Temp 98.3 F (36.8 C)      Temp Source Oral     SpO2 100 %     Weight 165 lb (74.8 kg)     Height 5\' 5"  (1.651 m)     Head Circumference      Peak Flow      Pain Score      Pain Loc      Pain Edu?      Excl. in [place]?     Constitutional: Alert and oriented. No acute distress. Does not appear intoxicated. HEENT Head: Normocephalic and atraumatic. Face: No facial bony tenderness. Stable midface Ears: No hemotympanum bilaterally. No Battle sign Eyes: No eye injury. PERRL. No raccoon eyes Nose: Nontender. No epistaxis. No rhinorrhea Mouth/Throat: Mucous membranes are moist. No oropharyngeal blood. No dental injury. Airway patent without stridor. Normal voice. Neck: no C-collar in place. No midline c-spine tenderness.  Cardiovascular: Normal rate, regular rhythm. Normal and symmetric distal pulses are present in all extremities. Pulmonary/Chest: Chest wall is stable and nontender to palpation/compression. Normal respiratory effort. Breath  sounds are normal. No crepitus.  Abdominal: Soft, nontender, non distended. Musculoskeletal: Nontender with normal full range of motion in all extremities. No deformities. No thoracic or lumbar midline spinal tenderness. Pelvis is stable. Skin: Skin is warm, dry and intact. No abrasions or contutions. Psychiatric: Speech and behavior are appropriate. Neurological: Normal speech and language. cranial nerves are intact, intact sensation and strength of bilateral upper extremity, no pronator drift or dysmetria of the upper extremities. 3/5 strength of b/l LE, no extinction but patient endorses decrease sensation to touch on b/l LE, 1+ patella DTRs b/l, normal rectal tone, PVR 26cc/  Glascow Coma Score: 4 - Opens eyes on own 6 - Follows simple motor commands 5 - Alert and oriented GCS: 15  ____________________________________________   LABS (all labs ordered are listed, but only abnormal results are displayed)  Labs Reviewed  CBC WITH DIFFERENTIAL/PLATELET - Abnormal;  Notable for the following components:      Result Value   Lymphs Abs 0.9 (*)    All other components within normal limits  COMPREHENSIVE METABOLIC PANEL - Abnormal; Notable for the following components:   Sodium 131 (*)    Chloride 96 (*)    Glucose, Bld 133 (*)    Calcium 8.8 (*)    ALT 12 (*)    All other components within normal limits  PROTIME-INR  APTT  URINALYSIS, COMPLETE (UACMP) WITH MICROSCOPIC   ____________________________________________  EKG  none  ____________________________________________  RADIOLOGY  I have personally reviewed the images performed during this visit and I agree with the Radiologist's read.   Interpretation by Radiologist:  Ct Head Wo Contrast  Result Date: 06/11/2017 CLINICAL DATA:  84 year old female post fall 4 days ago. Awoke this morning with right leg numbness and now with left leg numbness. Neck pain. Initial encounter. EXAM: CT HEAD WITHOUT CONTRAST CT CERVICAL SPINE WITHOUT CONTRAST TECHNIQUE: Multidetector CT imaging of the head and cervical spine was performed following the standard protocol without intravenous contrast. Multiplanar CT image reconstructions of the cervical spine were also generated. COMPARISON:  09/02/2016 CT head and cervical spine. FINDINGS: CT HEAD FINDINGS Brain: No intracranial hemorrhage or CT evidence of large acute infarct. Chronic microvascular changes. Atrophy No intracranial mass lesion noted on this unenhanced exam. Vascular: Vascular calcifications. Skull: No skull fracture. Sinuses/Orbits: No acute orbital abnormality. Opacification right frontal sinus and ethmoid sinus air cells. Mild mucosal thickening right maxillary sinus. Other: Mastoid air cells and middle ear cavities are clear. CT CERVICAL SPINE FINDINGS Alignment: Alignment similar to prior exam. Skull base and vertebrae: No cervical spine fracture. Soft tissues and spinal canal: No abnormal prevertebral soft tissue swelling. Disc levels: Cervical  spondylotic changes C3-4 thru C6-7 with mild spinal stenosis and slight cord flattening C3-4 and C5-6 level similar to prior exam. Upper chest: No worrisome lung apical lesion. Other: Carotid bifurcation calcifications. Slightly heterogeneous thyroid gland. IMPRESSION: CT HEAD No skull fracture or intracranial hemorrhage. Chronic microvascular changes. Atrophy. Opacification right frontal sinus and ethmoid sinus air cells. Mild mucosal thickening right maxillary sinus. CT CERVICAL SPINE Alignment similar to prior exam. No cervical spine fracture or abnormal prevertebral soft tissue swelling. Cervical spondylotic changes C3-4 thru C6-7 with mild spinal stenosis and slight cord flattening C3-4 and C5-6 level similar to prior exam. Electronically Signed   By: Genia Del M.D.   On: 06/11/2017 14:57   Ct Cervical Spine Wo Contrast  Result Date: 06/11/2017 CLINICAL DATA:  84 year old female post fall 4 days ago. Awoke this morning with right leg  numbness and now with left leg numbness. Neck pain. Initial encounter. EXAM: CT HEAD WITHOUT CONTRAST CT CERVICAL SPINE WITHOUT CONTRAST TECHNIQUE: Multidetector CT imaging of the head and cervical spine was performed following the standard protocol without intravenous contrast. Multiplanar CT image reconstructions of the cervical spine were also generated. COMPARISON:  09/02/2016 CT head and cervical spine. FINDINGS: CT HEAD FINDINGS Brain: No intracranial hemorrhage or CT evidence of large acute infarct. Chronic microvascular changes. Atrophy No intracranial mass lesion noted on this unenhanced exam. Vascular: Vascular calcifications. Skull: No skull fracture. Sinuses/Orbits: No acute orbital abnormality. Opacification right frontal sinus and ethmoid sinus air cells. Mild mucosal thickening right maxillary sinus. Other: Mastoid air cells and middle ear cavities are clear. CT CERVICAL SPINE FINDINGS Alignment: Alignment similar to prior exam. Skull base and vertebrae: No  cervical spine fracture. Soft tissues and spinal canal: No abnormal prevertebral soft tissue swelling. Disc levels: Cervical spondylotic changes C3-4 thru C6-7 with mild spinal stenosis and slight cord flattening C3-4 and C5-6 level similar to prior exam. Upper chest: No worrisome lung apical lesion. Other: Carotid bifurcation calcifications. Slightly heterogeneous thyroid gland. IMPRESSION: CT HEAD No skull fracture or intracranial hemorrhage. Chronic microvascular changes. Atrophy. Opacification right frontal sinus and ethmoid sinus air cells. Mild mucosal thickening right maxillary sinus. CT CERVICAL SPINE Alignment similar to prior exam. No cervical spine fracture or abnormal prevertebral soft tissue swelling. Cervical spondylotic changes C3-4 thru C6-7 with mild spinal stenosis and slight cord flattening C3-4 and C5-6 level similar to prior exam. Electronically Signed   By: Genia Del M.D.   On: 06/11/2017 14:57     ____________________________________________   PROCEDURES  Procedure(s) performed: None Procedures Critical Care performed:  None ____________________________________________   INITIAL IMPRESSION / ASSESSMENT AND PLAN / ED COURSE  84 y.o. female with a history of remote colon cancer, atrial fibrillation not on blood thinners, and hyperlipidemia who presents for evaluation of bilateral leg numbness/ weakness and difficulty walking since this am in the setting of head/ neck trauma 4 days ago. or symptoms of cauda equina, no midline spine tenderness, normal rectal tone, normal PVR. Patient does have weakness of bilateral lower extremity on exam and has very hard time walking without assistance which is new for her. CT head and neck with no acute findings. Unclear etiology of patient's symptoms at this time especially with no involvement of upper extremities and neck trauma/ pain only (no back trauma). No signs or symptoms of cauda equina. Will get MRI head and cervical spine. Will  treat neck pain with oxycodone and tylenol. Labs with no acute findigns  Clinical Course as of Jun 11 1942  Mon Jun 11, 2017  1940 MRI pending. Care transferred to Dr. Jacqualine Code.  [CV]    Clinical Course User Index [CV] Alfred Levins [place], MD     As part of my medical decision making, I reviewed the following data within the [place] notes reviewed and incorporated, Labs reviewed , Patient signed out to Dr. Jacqualine Code, Radiograph reviewed , Notes from prior ED visits and [place] Controlled Substance Database    Pertinent labs & imaging results that were available during my care of the patient were reviewed by me and considered in my medical decision making (see chart for details).    ____________________________________________   FINAL CLINICAL IMPRESSION(S) / ED DIAGNOSES  Final diagnoses:  Bilateral leg weakness  Bilateral leg numbness  Fall, initial encounter  Neck pain  Traumatic injury of head, initial encounter  NEW MEDICATIONS STARTED DURING THIS VISIT:  ED Discharge Orders    None       Note:  This document was prepared using Dragon voice recognition software and may include unintentional dictation errors.    Alfred Levins, [place], MD 06/11/17 [place], [place], MD 06/11/17 [phone]

## 2017-06-11 NOTE — ED Notes (Signed)
Pt still in radiology.

## 2017-06-11 NOTE — ED Notes (Signed)
Patient transported to MRI 

## 2017-06-11 NOTE — H&P (Signed)
[name] [place] NAME: Caroline Aguilar    MR#:  [id number]  DATE OF BIRTH:  1926-04-08  DATE OF ADMISSION:  06/11/2017  PRIMARY CARE PHYSICIAN: [name], MD   REQUESTING/REFERRING PHYSICIAN: Jacqualine Code, MD  CHIEF COMPLAINT:  Right leg weakness  HISTORY OF PRESENT ILLNESS:  Caroline Aguilar  is a 84 y.o. female who presents with right leg weakness.  Patient states that she woke up from sleep and had the symptoms.  Patient is on Eliquis for paroxysmal A. fib.  She states that she missed 2 doses the day prior to initiation of symptoms.  Here in the ED she was evaluated for stroke and was found to have an acute stroke.  Hospitalist were called for admission.  PAST MEDICAL HISTORY:   Past Medical History:  Diagnosis Date  . A-fib ([place])   . Angina pectoris ([place])   . Cancer [facility])    Colon cancer  . Hyperlipidemia     PAST SURGICAL HISTORY:   Past Surgical History:  Procedure Laterality Date  . CARDIAC CATHETERIZATION N/A 07/19/2015   Procedure: Right and Left Heart Cath;  Surgeon: Isaias Cowman, MD;  Location: [place] CV LAB;  Service: Cardiovascular;  Laterality: N/A;  . CATARACT EXTRACTION Bilateral   . COLON SURGERY    . ESOPHAGOGASTRODUODENOSCOPY N/A 07/08/2015   Procedure: ESOPHAGOGASTRODUODENOSCOPY (EGD);  Surgeon: Hulen Luster, MD;  Location: [facility] ENDOSCOPY;  Service: Endoscopy;  Laterality: N/A;  . HIP SURGERY Bilateral     SOCIAL HISTORY:   Social History   Tobacco Use  . Smoking status: Never Smoker  . Smokeless tobacco: Never Used  Substance Use Topics  . Alcohol use: No    FAMILY HISTORY:   Family History  Problem Relation Age of Onset  . Cancer Mother   . Heart failure Father     DRUG ALLERGIES:   Allergies  Allergen Reactions  . Penicillins Anaphylaxis and Other (See Comments)    Has patient had a PCN reaction causing immediate rash, facial/tongue/throat swelling, SOB or lightheadedness  with hypotension: Yes Has patient had a PCN reaction causing severe rash involving mucus membranes or skin necrosis: No Has patient had a PCN reaction that required hospitalization No Has patient had a PCN reaction occurring within the last 10 years: No If all of the above answers are "NO", then may proceed with Cephalosporin use.  . Azithromycin Other (See Comments)    Reaction:  Unknown   . Famciclovir Other (See Comments)    Reaction:  Unknown     MEDICATIONS AT HOME:   Prior to Admission medications   Medication Sig Start Date End Date Taking? Authorizing Provider  amiodarone (PACERONE) 200 MG tablet Take 1 tablet (200 mg total) by mouth daily. Patient taking differently: Take 100 mg by mouth 2 (two) times daily.  07/20/15  Yes Wieting, Richard, MD  apixaban (ELIQUIS) 2.5 MG TABS tablet Take 2.5 mg by mouth 2 (two) times daily.    Yes [provider]  Calcium Carbonate-Vitamin D (CALCIUM 600+D) 600-200 MG-UNIT TABS Take 1 tablet by mouth daily.   Yes [provider]  docusate sodium (COLACE) 100 MG capsule Take 100 mg by mouth daily. Reported on 08/05/2015   Yes [provider]  latanoprost (XALATAN) 0.005 % ophthalmic solution Place 1 drop into both eyes at bedtime.   Yes [provider]  meloxicam (MOBIC) 7.5 MG tablet Take 7.5 mg by mouth daily as needed. 06/08/16  Yes [provider]  pantoprazole (PROTONIX) 40 MG tablet Take 40 mg by mouth daily as needed.    Yes [provider]  polyethylene glycol (MIRALAX / GLYCOLAX) packet Take 17 g by mouth daily as needed for mild constipation. 09/05/16  Yes Gladstone Lighter, MD  potassium chloride SA (K-DUR,KLOR-CON) 20 MEQ tablet Take 40 mEq by mouth daily. 10/15/15  Yes [provider]  sertraline (ZOLOFT) 50 MG tablet Take 50 mg by mouth daily.   Yes [provider]  simvastatin (ZOCOR) 20 MG tablet Take 20 mg by mouth at bedtime.   Yes [provider]  timolol  (TIMOPTIC) 0.5 % ophthalmic solution Place 1 drop into both eyes 2 (two) times daily.   Yes [provider]  feeding supplement, ENSURE ENLIVE, (ENSURE ENLIVE) LIQD Take 237 mLs by mouth 2 (two) times daily between meals. 07/09/15   Epifanio Lesches, MD    REVIEW OF SYSTEMS:  Review of Systems  Constitutional: Negative for chills, fever, malaise/fatigue and weight loss.  HENT: Negative for ear pain, hearing loss and tinnitus.   Eyes: Negative for blurred vision, double vision, pain and redness.  Respiratory: Negative for cough, hemoptysis and shortness of breath.   Cardiovascular: Negative for chest pain, palpitations, orthopnea and leg swelling.  Gastrointestinal: Negative for abdominal pain, constipation, diarrhea, nausea and vomiting.  Genitourinary: Negative for dysuria, frequency and hematuria.  Musculoskeletal: Negative for back pain, joint pain and neck pain.  Skin:       No acne, rash, or lesions  Neurological: Positive for focal weakness. Negative for dizziness, tremors and weakness.  Endo/Heme/Allergies: Negative for polydipsia. Does not bruise/bleed easily.  Psychiatric/Behavioral: Negative for depression. The patient is not nervous/anxious and does not have insomnia.      VITAL SIGNS:   Vitals:   06/11/17 2245 06/11/17 2300 06/11/17 2315 06/11/17 2330  BP:  (!) 158/72 (!) 143/86 (!) 165/80  Pulse: (!) 59 66 71 61  Resp: 18 17 18 16   Temp:      TempSrc:      SpO2: 100% 99% 96% 97%  Weight:      Height:       Wt Readings from Last 3 Encounters:  06/11/17 74.8 kg (165 lb)  09/02/16 74.2 kg (163 lb 8 oz)  08/05/15 71.2 kg (157 lb)    PHYSICAL EXAMINATION:  Physical Exam  Vitals reviewed. Constitutional: She is oriented to person, place, and time. She appears well-developed and well-nourished. No distress.  HENT:  Head: Normocephalic and atraumatic.  Mouth/Throat: Oropharynx is clear and moist.  Eyes: Conjunctivae and EOM are normal. Pupils are equal,  round, and reactive to light. No scleral icterus.  Neck: Normal range of motion. Neck supple. No JVD present. No thyromegaly present.  Cardiovascular: Normal rate, regular rhythm and intact distal pulses. Exam reveals no gallop and no friction rub.  No murmur heard. Respiratory: Effort normal and breath sounds normal. No respiratory distress. She has no wheezes. She has no rales.  GI: Soft. Bowel sounds are normal. She exhibits no distension. There is no tenderness.  Musculoskeletal: Normal range of motion. She exhibits no edema.  No arthritis, no gout  Lymphadenopathy:    She has no cervical adenopathy.  Neurological: She is alert and oriented to person, place, and time. No cranial nerve deficit.  Neurologic: Cranial nerves II-XII intact, Sensation intact to light touch/pinprick, 5/5 strength in all extremities except for right lower extremity which has 3/5 strength, no dysarthria, no aphasia, no dysphagia, memory  intact, finger to nose testing showed no abnormality, no pronator drift  Skin: Skin is warm and dry. No rash noted. No erythema.  Psychiatric: She has a normal mood and affect. Her behavior is normal. Judgment and thought content normal.    LABORATORY PANEL:   CBC Recent Labs  Lab 06/11/17 1400  WBC 3.8  HGB 13.9  HCT 41.9  PLT 150   ------------------------------------------------------------------------------------------------------------------  Chemistries  Recent Labs  Lab 06/11/17 1400  NA 131*  K 3.8  CL 96*  CO2 26  GLUCOSE 133*  BUN 16  CREATININE 0.77  CALCIUM 8.8*  AST 22  ALT 12*  ALKPHOS 70  BILITOT 0.6   ------------------------------------------------------------------------------------------------------------------  Cardiac Enzymes No results for input(s): TROPONINI in the last 168 hours. ------------------------------------------------------------------------------------------------------------------  RADIOLOGY:  Ct Head Wo  Contrast  Result Date: 06/11/2017 CLINICAL DATA:  84 year old female post fall 4 days ago. Awoke this morning with right leg numbness and now with left leg numbness. Neck pain. Initial encounter. EXAM: CT HEAD WITHOUT CONTRAST CT CERVICAL SPINE WITHOUT CONTRAST TECHNIQUE: Multidetector CT imaging of the head and cervical spine was performed following the standard protocol without intravenous contrast. Multiplanar CT image reconstructions of the cervical spine were also generated. COMPARISON:  09/02/2016 CT head and cervical spine. FINDINGS: CT HEAD FINDINGS Brain: No intracranial hemorrhage or CT evidence of large acute infarct. Chronic microvascular changes. Atrophy No intracranial mass lesion noted on this unenhanced exam. Vascular: Vascular calcifications. Skull: No skull fracture. Sinuses/Orbits: No acute orbital abnormality. Opacification right frontal sinus and ethmoid sinus air cells. Mild mucosal thickening right maxillary sinus. Other: Mastoid air cells and middle ear cavities are clear. CT CERVICAL SPINE FINDINGS Alignment: Alignment similar to prior exam. Skull base and vertebrae: No cervical spine fracture. Soft tissues and spinal canal: No abnormal prevertebral soft tissue swelling. Disc levels: Cervical spondylotic changes C3-4 thru C6-7 with mild spinal stenosis and slight cord flattening C3-4 and C5-6 level similar to prior exam. Upper chest: No worrisome lung apical lesion. Other: Carotid bifurcation calcifications. Slightly heterogeneous thyroid gland. IMPRESSION: CT HEAD No skull fracture or intracranial hemorrhage. Chronic microvascular changes. Atrophy. Opacification right frontal sinus and ethmoid sinus air cells. Mild mucosal thickening right maxillary sinus. CT CERVICAL SPINE Alignment similar to prior exam. No cervical spine fracture or abnormal prevertebral soft tissue swelling. Cervical spondylotic changes C3-4 thru C6-7 with mild spinal stenosis and slight cord flattening C3-4 and C5-6  level similar to prior exam. Electronically Signed   By: Genia Del M.D.   On: 06/11/2017 14:57   Ct Cervical Spine Wo Contrast  Result Date: 06/11/2017 CLINICAL DATA:  84 year old female post fall 4 days ago. Awoke this morning with right leg numbness and now with left leg numbness. Neck pain. Initial encounter. EXAM: CT HEAD WITHOUT CONTRAST CT CERVICAL SPINE WITHOUT CONTRAST TECHNIQUE: Multidetector CT imaging of the head and cervical spine was performed following the standard protocol without intravenous contrast. Multiplanar CT image reconstructions of the cervical spine were also generated. COMPARISON:  09/02/2016 CT head and cervical spine. FINDINGS: CT HEAD FINDINGS Brain: No intracranial hemorrhage or CT evidence of large acute infarct. Chronic microvascular changes. Atrophy No intracranial mass lesion noted on this unenhanced exam. Vascular: Vascular calcifications. Skull: No skull fracture. Sinuses/Orbits: No acute orbital abnormality. Opacification right frontal sinus and ethmoid sinus air cells. Mild mucosal thickening right maxillary sinus. Other: Mastoid air cells and middle ear cavities are clear. CT CERVICAL SPINE FINDINGS Alignment: Alignment similar to prior exam. Skull base and vertebrae:  No cervical spine fracture. Soft tissues and spinal canal: No abnormal prevertebral soft tissue swelling. Disc levels: Cervical spondylotic changes C3-4 thru C6-7 with mild spinal stenosis and slight cord flattening C3-4 and C5-6 level similar to prior exam. Upper chest: No worrisome lung apical lesion. Other: Carotid bifurcation calcifications. Slightly heterogeneous thyroid gland. IMPRESSION: CT HEAD No skull fracture or intracranial hemorrhage. Chronic microvascular changes. Atrophy. Opacification right frontal sinus and ethmoid sinus air cells. Mild mucosal thickening right maxillary sinus. CT CERVICAL SPINE Alignment similar to prior exam. No cervical spine fracture or abnormal prevertebral soft  tissue swelling. Cervical spondylotic changes C3-4 thru C6-7 with mild spinal stenosis and slight cord flattening C3-4 and C5-6 level similar to prior exam. Electronically Signed   By: Genia Del M.D.   On: 06/11/2017 14:57   Mr Brain Wo Contrast  Result Date: 06/11/2017 CLINICAL DATA:  Recent fall. Now with neck pain and bilateral lower extremity numbness. EXAM: MRI HEAD WITHOUT CONTRAST MRI CERVICAL SPINE WITHOUT CONTRAST TECHNIQUE: Multiplanar, multiecho pulse sequences of the brain and surrounding structures, and cervical spine, to include the craniocervical junction and cervicothoracic junction, were obtained without intravenous contrast. COMPARISON:  Head CT 06/11/2017 FINDINGS: MRI HEAD FINDINGS Brain: The midline structures are normal. Small focus of abnormal diffusion weighted signal in the left corona radiata. No mass lesion, hydrocephalus, dural abnormality or extra-axial collection. Multifocal white matter hyperintensity, most commonly due to chronic ischemic microangiopathy. No age-advanced or lobar predominant atrophy. 3-5 peripherally scattered chronic micro hemorrhagic foci. Vascular: Major intracranial arterial and venous sinus flow voids are preserved. Skull: Normal marrow signal. Sinuses/Orbits: No fluid levels or advanced mucosal thickening. No mastoid or middle ear effusion. Normal orbits. MRI CERVICAL SPINE FINDINGS Alignment: Mild reversal of normal cervical lordosis. Grade 1 retrolisthesis at C5-6 and C6-7. Vertebrae: No fracture, evidence of discitis, or bone lesion. Cord: Normal signal and morphology. Posterior Fossa, vertebral arteries, paraspinal tissues: Visualized posterior fossa is normal. Vertebral artery flow voids are preserved. No prevertebral soft tissue swelling. Disc levels: C1-C2: Normal. C2-C3: Normal disc space and facets. No spinal canal or neuroforaminal stenosis. C3-C4: Mild left neural foraminal stenosis due to uncovertebral and facet hypertrophy. C4-C5:  Left-greater-than-right facet hypertrophy with mild left uncovertebral hypertrophy resulting in mild-to-moderate left neural foraminal stenosis. C5-C6: Disc space narrowing with small disc osteophyte complex moderately narrowing both neural foramina. Mild central spinal canal stenosis. C6-C7: Small disc osteophyte complex with mild bilateral foraminal narrowing. Mild central spinal canal stenosis. C7-T1: Normal disc space and facets. No spinal canal or neuroforaminal stenosis. IMPRESSION: 1. Small focus of abnormal DWI signal in the left corona radiata is consistent with an acute or early subacute infarct. No acute hemorrhage or mass effect. 2. Nonspecific 3-5 peripherally scattered foci of chronic microhemorrhage. 3. No high-grade spinal canal stenosis or cervical cord compression. 4. Multilevel mild spinal canal and mild-to-moderate neural foraminal stenosis. Electronically Signed   By: Ulyses Jarred M.D.   On: 06/11/2017 20:46   Mr Cervical Spine Wo Contrast  Result Date: 06/11/2017 CLINICAL DATA:  Recent fall. Now with neck pain and bilateral lower extremity numbness. EXAM: MRI HEAD WITHOUT CONTRAST MRI CERVICAL SPINE WITHOUT CONTRAST TECHNIQUE: Multiplanar, multiecho pulse sequences of the brain and surrounding structures, and cervical spine, to include the craniocervical junction and cervicothoracic junction, were obtained without intravenous contrast. COMPARISON:  Head CT 06/11/2017 FINDINGS: MRI HEAD FINDINGS Brain: The midline structures are normal. Small focus of abnormal diffusion weighted signal in the left corona radiata. No mass lesion, hydrocephalus, dural abnormality  or extra-axial collection. Multifocal white matter hyperintensity, most commonly due to chronic ischemic microangiopathy. No age-advanced or lobar predominant atrophy. 3-5 peripherally scattered chronic micro hemorrhagic foci. Vascular: Major intracranial arterial and venous sinus flow voids are preserved. Skull: Normal marrow  signal. Sinuses/Orbits: No fluid levels or advanced mucosal thickening. No mastoid or middle ear effusion. Normal orbits. MRI CERVICAL SPINE FINDINGS Alignment: Mild reversal of normal cervical lordosis. Grade 1 retrolisthesis at C5-6 and C6-7. Vertebrae: No fracture, evidence of discitis, or bone lesion. Cord: Normal signal and morphology. Posterior Fossa, vertebral arteries, paraspinal tissues: Visualized posterior fossa is normal. Vertebral artery flow voids are preserved. No prevertebral soft tissue swelling. Disc levels: C1-C2: Normal. C2-C3: Normal disc space and facets. No spinal canal or neuroforaminal stenosis. C3-C4: Mild left neural foraminal stenosis due to uncovertebral and facet hypertrophy. C4-C5: Left-greater-than-right facet hypertrophy with mild left uncovertebral hypertrophy resulting in mild-to-moderate left neural foraminal stenosis. C5-C6: Disc space narrowing with small disc osteophyte complex moderately narrowing both neural foramina. Mild central spinal canal stenosis. C6-C7: Small disc osteophyte complex with mild bilateral foraminal narrowing. Mild central spinal canal stenosis. C7-T1: Normal disc space and facets. No spinal canal or neuroforaminal stenosis. IMPRESSION: 1. Small focus of abnormal DWI signal in the left corona radiata is consistent with an acute or early subacute infarct. No acute hemorrhage or mass effect. 2. Nonspecific 3-5 peripherally scattered foci of chronic microhemorrhage. 3. No high-grade spinal canal stenosis or cervical cord compression. 4. Multilevel mild spinal canal and mild-to-moderate neural foraminal stenosis. Electronically Signed   By: Ulyses Jarred M.D.   On: 06/11/2017 20:46    EKG:   Orders placed or performed during the hospital encounter of 06/11/17  . EKG 12-Lead  . EKG 12-Lead    IMPRESSION AND PLAN:  Principal Problem:   Stroke [facility]) -left corona radiata stroke seen in MRI imaging tonight.  Will admit per stroke admission order set  with appropriate consults and labs as imaging his already been completed. Active Problems:   AF (paroxysmal atrial fibrillation) ([place]) -continue home rate controlling medications.  We will restart her Eliquis tomorrow morning pending neurology's recommendations.   (HFpEF) heart failure with preserved ejection fraction ([place]) -continue home meds   HLD (hyperlipidemia) -home dose statin   GERD (gastroesophageal reflux disease) -home dose PPI  All the records are reviewed and case discussed with ED provider. Management plans discussed with the patient and/or family.  DVT PROPHYLAXIS: Systemic anticoagulation  GI PROPHYLAXIS: PPI  ADMISSION STATUS: Observation  CODE STATUS: DNR Code Status History    Date Active Date Inactive Code Status Order ID Comments User Context   09/02/2016 22:15 09/05/2016 20:20 DNR [id number]  Dustin Flock, MD Inpatient   07/15/2015 04:39 07/20/2015 16:00 Full Code [id number]  Saundra Shelling, MD ED   07/02/2015 19:03 07/09/2015 18:12 Full Code [id number]  Lytle Butte, MD ED   06/19/2015 03:15 06/20/2015 16:31 Full Code [id number]  Saundra Shelling, MD Inpatient    Questions for Most Recent Historical Code Status (Order [id number])    Question Answer Comment   In the event of cardiac or respiratory ARREST Do not call a "code blue"    In the event of cardiac or respiratory ARREST Do not perform Intubation, CPR, defibrillation or ACLS    In the event of cardiac or respiratory ARREST Use medication by any route, position, wound care, and other measures to relive pain and suffering. May use oxygen, suction and manual treatment of airway obstruction as needed for comfort.  TOTAL TIME TAKING CARE OF THIS PATIENT: 40 minutes.   Kehaulani Fruin FIELDING 06/11/2017, 11:48 PM  [facility]  Office  [phone]  CC: Primary care physician; [name], MD  Note:  This document was prepared using Dragon voice recognition software and may include  unintentional dictation errors.

## 2017-06-12 ENCOUNTER — Observation Stay: Payer: Medicare HMO

## 2017-06-12 ENCOUNTER — Other Ambulatory Visit: Payer: Self-pay

## 2017-06-12 ENCOUNTER — Observation Stay
Admit: 2017-06-12 | Discharge: 2017-06-12 | Disposition: A | Discharge status: Home / Self Care | Payer: Medicare HMO | Attending: Internal Medicine | Admitting: Internal Medicine

## 2017-06-12 DIAGNOSIS — I63233 Cerebral infarction due to unspecified occlusion or stenosis of bilateral carotid arteries: Secondary | ICD-10-CM | POA: Diagnosis not present

## 2017-06-12 DIAGNOSIS — I6359 Cerebral infarction due to unspecified occlusion or stenosis of other cerebral artery: Secondary | ICD-10-CM | POA: Diagnosis not present

## 2017-06-12 DIAGNOSIS — I5030 Unspecified diastolic (congestive) heart failure: Secondary | ICD-10-CM | POA: Diagnosis not present

## 2017-06-12 DIAGNOSIS — I4891 Unspecified atrial fibrillation: Secondary | ICD-10-CM | POA: Diagnosis not present

## 2017-06-12 DIAGNOSIS — I639 Cerebral infarction, unspecified: Secondary | ICD-10-CM | POA: Diagnosis not present

## 2017-06-12 DIAGNOSIS — E785 Hyperlipidemia, unspecified: Secondary | ICD-10-CM | POA: Diagnosis not present

## 2017-06-12 LAB — LIPID PANEL
Cholesterol: 185 mg/dL (ref 0–200)
HDL: 50 mg/dL (ref >40)
LDL CALC: 102 mg/dL — AB (ref 0–99)
Total CHOL/HDL Ratio: 3.7 RATIO
Triglycerides: 164 mg/dL — ABNORMAL HIGH (ref <150)
VLDL: 33 mg/dL (ref 0–40)

## 2017-06-12 MED ORDER — ACETAMINOPHEN 160 MG/5ML PO SOLN
650.0000 mg | ORAL | Status: DC | PRN
  Filled 2017-06-12: qty 20.3

## 2017-06-12 MED ORDER — AMIODARONE HCL 200 MG PO TABS: 100.0000 mg | ORAL_TABLET | Freq: Two times a day (BID) | ORAL | Status: DC

## 2017-06-12 MED ORDER — LATANOPROST 0.005 % OP SOLN
1.0000 [drp] | Freq: Every day | OPHTHALMIC | Status: DC
  Administered 2017-06-12: 22:00:00 1 [drp] via OPHTHALMIC
  Filled 2017-06-12: qty 2.5

## 2017-06-12 MED ORDER — PANTOPRAZOLE SODIUM 40 MG PO TBEC
40.0000 mg | DELAYED_RELEASE_TABLET | Freq: Every day | ORAL | Status: DC
  Administered 2017-06-12 – 2017-06-13 (×2): 40 mg via ORAL
  Filled 2017-06-12 (×2): qty 1

## 2017-06-12 MED ORDER — SERTRALINE HCL 50 MG PO TABS
50.0000 mg | ORAL_TABLET | Freq: Every day | ORAL | Status: DC
  Administered 2017-06-12 – 2017-06-13 (×2): 50 mg via ORAL
  Filled 2017-06-12 (×2): qty 1

## 2017-06-12 MED ORDER — APIXABAN 2.5 MG PO TABS
2.5000 mg | ORAL_TABLET | Freq: Two times a day (BID) | ORAL | Status: DC
  Administered 2017-06-12 – 2017-06-13 (×3): 2.5 mg via ORAL
  Filled 2017-06-12 (×3): qty 1

## 2017-06-12 MED ORDER — AMIODARONE HCL 200 MG PO TABS
100.0000 mg | ORAL_TABLET | Freq: Two times a day (BID) | ORAL | Status: DC
  Administered 2017-06-12 – 2017-06-13 (×3): 100 mg via ORAL
  Filled 2017-06-12: qty 1
  Filled 2017-06-12: qty 0.5
  Filled 2017-06-12: qty 1

## 2017-06-12 MED ORDER — ACETAMINOPHEN 650 MG RE SUPP: 650.0000 mg | RECTAL | Status: DC | PRN

## 2017-06-12 MED ORDER — TIMOLOL MALEATE 0.5 % OP SOLN
1.0000 [drp] | Freq: Two times a day (BID) | OPHTHALMIC | Status: DC
  Administered 2017-06-12 – 2017-06-13 (×3): 1 [drp] via OPHTHALMIC
  Filled 2017-06-12 (×2): qty 5

## 2017-06-12 MED ORDER — ACETAMINOPHEN 325 MG PO TABS
650.0000 mg | ORAL_TABLET | ORAL | Status: DC | PRN
  Administered 2017-06-12 – 2017-06-13 (×2): 650 mg via ORAL
  Filled 2017-06-12 (×2): qty 2

## 2017-06-12 MED ORDER — ATORVASTATIN CALCIUM 20 MG PO TABS
40.0000 mg | ORAL_TABLET | Freq: Every day | ORAL | Status: DC
  Administered 2017-06-12: 18:00:00 40 mg via ORAL
  Filled 2017-06-12: qty 2

## 2017-06-12 MED ORDER — SIMVASTATIN 20 MG PO TABS: 20.0000 mg | ORAL_TABLET | Freq: Every day | ORAL | Status: DC

## 2017-06-12 MED ORDER — STROKE: EARLY STAGES OF RECOVERY BOOK
Freq: Once | Status: AC
  Administered 2017-06-12: 07:00:00

## 2017-06-12 MED ORDER — TIMOLOL MALEATE 0.5 % OP SOLN
1.0000 [drp] | Freq: Two times a day (BID) | OPHTHALMIC | Status: DC
  Filled 2017-06-12: qty 5

## 2017-06-12 MED ORDER — APIXABAN 2.5 MG PO TABS: 2.5000 mg | ORAL_TABLET | Freq: Two times a day (BID) | ORAL | Status: DC

## 2017-06-12 NOTE — Clinical Social Work Note (Signed)
Clinical Social Work Assessment  Patient Details  Name: Caroline Aguilar MRN: [id number] Date of Birth: 1925/10/01  Date of referral:  06/12/17               Reason for consult:  Facility Placement, Discharge Planning                Permission sought to share information with:  Case Manager, Customer service manager, Family Supports Permission granted to share information::  Yes, Verbal Permission Granted  Name::        Agency::     Relationship::  Son and daughter, Edd Arbour and Print production planner Information:     Housing/Transportation Living arrangements for the past 2 months:  Single Family Home Source of Information:  Patient, Medical Team, Adult Children, Case Manager Patient Interpreter Needed:  None Criminal Activity/Legal Involvement Pertinent to Current Situation/Hospitalization:  No - Comment as needed Significant Relationships:  Adult Children, Other Family Members, Community Support, Other(Comment) Lives with:  Self Do you feel safe going back to the place where you live?  No Need for family participation in patient care:  Yes (Comment)  Care giving concerns:  Patient admitted to [facility]  Due to fall last Thursday, hit the back of her head on hard wood floor.  This morning awoke and right leg was numb and now left leg is having numbness.  Caroline Aguilar is a 84 y.o. female with a history of remote colon cancer, atrial fibrillation not on blood thinners, and hyperlipidemia who presents for evaluation of bilateral leg numbness. Patient reports that 4 days ago she was in her kitchen talking on the phone and was trying to sit on a chair. The line of the chair was stuck under the table saw and patient said she fell off the chair. She fell backwards and hit her head on the ground. She reports whiplash movement of the neck. Since then she has had an occipital headache and neck pain. This morning she woke up her right leg felt numb but and heavy. She was having severe difficulty  ambulating. By the time she arrived to the emergency room her left leg started to go numb as well. She denies any back pain. She is complaining of constant moderate sharp neck pain for 4 days. She denies any weakness or numbness of her upper extremities. She denies saddle anesthesia, urinary or bowel incontinence or retention. According to the patient lives alone and is able to ambulate with a walker until this am when she was unable to ambulate. She reports h/o several falls because one of her legs is shorter than the other which causes her a lot of balance issues.   Patient has been worked up for Stroke protocol. Family at bedside explains above situation and reports due to falling so frequently, they would like her to be admitted to SNF at [place] if possible. Patient reports she has always gone to [facility] and would like to return.   Social Worker assessment / plan:  LCSW completed assessment with patient and DIL.  Both report they are hopeful for SNF and aware of current barriers after working with PT as patient did well walking around the nursing station. Family feels she is just unsafe to be alone and that SNF will be worked up, but to prepare for possible denial. Discussed private payment options when appeared to be not a choice and if denied family plans to go home with caregivers in family and home health.  LCSW will  work patient up for SNF and begin insurance authorization. PT reports patient did overall well and does not need to be alone, but more so because prior to admission she was already a high fall risk.  First choice for placement would be [facility].   SNF work up completed. LCSW started insurance authorization. WIll follow up with bed offers and insurance auth with family once more is known.  Employment status:  Retired Nurse, adult PT Recommendations:  [place] / Referral to community resources:  [place]  Patient/Family's Response to care:  Understanding and appreciative.   Patient/Family's Understanding of and Emotional Response to Diagnosis, Current Treatment, and Prognosis:  Family voice concern regarding immediate need for SNF, but lacking understanding and planning about Long term effect with current age and deficits.   Emotional Assessment Appearance:  Appears stated age Attitude/Demeanor/Rapport:    Affect (typically observed):  Accepting, Adaptable Orientation:  Oriented to Self, Oriented to Place, Oriented to  Time, Oriented to Situation Alcohol / Substance use:  Not Applicable Psych involvement (Current and /or in the community):  No (Comment)  Discharge Needs  Concerns to be addressed:  Adjustment to Illness, Care Coordination, Financial / Insurance Concerns Readmission within the last 30 days:  No Current discharge risk:  None Barriers to Discharge:  Continued Medical Work up, [place], [place], [place] 06/12/2017, 10:41 AM

## 2017-06-12 NOTE — ED Notes (Signed)
Pt was able to ambulate to the bed side commode as a 2 person assist.

## 2017-06-12 NOTE — Care Management Obs Status (Signed)
[place]   Patient Details  Name: Caroline Aguilar MRN: [id number] Date of Birth: 07-04-1925   Medicare Observation Status Notification Given:       Beverly Sessions, RN 06/12/2017, 3:42 PM

## 2017-06-12 NOTE — ED Notes (Signed)
Pt says she had hx of dysphagia due to esophagus requiring dilation. No current dysphagia

## 2017-06-12 NOTE — Evaluation (Signed)
Occupational Therapy Evaluation Patient Details Name: Caroline Aguilar MRN: [id number] DOB: [birth year] Today's Date: 06/12/2017    History of Present Illness Caroline Aguilar  is a 84 y.o. female who presents with right leg weakness.  Patient states that she woke up from sleep and had the symptoms.  Patient is on Eliquis for paroxysmal A. fib.  She states that she missed 2 doses the day prior to initiation of symptoms.  She was evaluated for stroke and MRI showed L corona radiata CVA.  Hospitalist were called for admission.   Clinical Impression   Pt seen for OT evaluation this date. Pt's initial impairments appear to be improving since time of initial PT evaluation and time of admission. Pt modified indep with AD for mobility, indep with ADL, and gets assist from family for IADL as needed. Pt endorses being forgetful with medications "depending on what's going on that day." At least 2 falls in past 12 months. RUE/RLE strength grossly 4+/5, intact sensation/coordination bilaterally, able to perform bed mobility with modified indep, supervision to min guard for mobility with RW for toilet transfer. Able to self feed dinner with no difficulty, spillage, or s/s aspiration. Even answered the phone and spoke with friend while preparing her foods by tearing small packets, stirring soup. Pt would benefit from skilled OT services in [facility] setting with intermittent supervision/assist to address noted mild impairments in strength, knowledge of AE/DME, and cognition/safety awareness, as well as home safety/set up and medication mgt in order to maximize pt's return to PLOF and minimize risk of future falls, injury, and readmission.     Follow Up Recommendations  Home health OT;Supervision - Intermittent    Equipment Recommendations  None recommended by OT    Recommendations for Other Services       Precautions / Restrictions Precautions Precautions: Fall Restrictions Weight Bearing Restrictions: No       Mobility Bed Mobility Overal bed mobility: Modified Independent                Transfers Overall transfer level: Needs assistance Equipment used: Rolling walker (2 wheeled) Transfers: Sit to/from Stand Sit to Stand: Min guard         General transfer comment: safe technique, no LOB    Balance Overall balance assessment: Needs assistance Sitting-balance support: No upper extremity supported Sitting balance-Leahy Scale: Good     Standing balance support: Bilateral upper extremity supported Standing balance-Leahy Scale: Fair                             ADL either performed or assessed with clinical judgement   ADL Overall ADL's : Needs assistance/impaired Eating/Feeding: Independent Eating/Feeding Details (indicate cue type and reason): pt able to open all containers/packets, eating independently, no spillage, no s/s aspiration Grooming: Independent   Upper Body Bathing: Supervision/ safety;Sitting   Lower Body Bathing: Sit to/from stand;Supervison/ safety;Min guard   Upper Body Dressing : Sitting;Supervision/safety   Lower Body Dressing: Sit to/from stand;Supervision/safety;Min guard   Toilet Transfer: RW;BSC;Ambulation;Min guard Toilet Transfer Details (indicate cue type and reason): good safety awareness, use of RW Toileting- Clothing Manipulation and Hygiene: Min guard;Sit to/from stand;Supervision/safety Toileting - Clothing Manipulation Details (indicate cue type and reason): while performing hygiene in standing for safety     Functional mobility during ADLs: Supervision/safety;Min guard;Rolling walker General ADL Comments: pt appears near baseline     Vision Baseline Vision/History: Wears glasses Wears Glasses: Reading only Patient Visual Report: No change  from baseline Vision Assessment?: No apparent visual deficits     Perception     Praxis      Pertinent Vitals/Pain Pain Assessment: No/denies pain     Hand Dominance Right    Extremity/Trunk Assessment Upper Extremity Assessment Upper Extremity Assessment: RUE deficits/detail RUE Deficits / Details: 4+/5 t/o RUE, intact sensation and coordination, pt endorses improving strength in RUE since this am   Lower Extremity Assessment Lower Extremity Assessment: RLE deficits/detail RLE Deficits / Details: 4+/5 R ankle dorsiflexion, grossly symmetrical 4+/5 t/o BLE   Cervical / Trunk Assessment Cervical / Trunk Assessment: Normal   Communication Communication Communication: HOH   Cognition Arousal/Alertness: Awake/alert Behavior During Therapy: WFL for tasks assessed/performed Overall Cognitive Status: Within Functional Limits for tasks assessed                                 General Comments: very mild cues for situational safety questions (responds first she would call son if she suspected a break in, then after VC, stated "well, maybe calling 911 would be better first)   General Comments       Exercises     Shoulder Instructions      Home Living Family/patient expects to be discharged to:: Private residence Living Arrangements: Alone Available Help at Discharge: Family Type of Home: House Home Access: Stairs to enter Technical brewer of Steps: 4 Entrance Stairs-Rails: Right Home Layout: One level     Bathroom Shower/Tub: Teacher, early years/pre: Handicapped height(BSC over top)     Home Equipment: Environmental consultant - 2 wheels;Cane - single point;Bedside commode;Tub bench;Other (comment)(lift recliner)          Prior Functioning/Environment Level of Independence: Needs assistance  Gait / Transfers Assistance Needed: Modified independent with rolling walker. 2 falls in the last 12 months ADL's / Homemaking Assistance Needed: Independent for ADLs, assist for IADLs. Drives limited distances. Endorses forgetful with medications at times "depending on what's going on that day"            OT Problem List: Decreased  strength;Impaired balance (sitting and/or standing);Decreased activity tolerance;Decreased safety awareness;Decreased knowledge of use of DME or AE      OT Treatment/Interventions: Self-care/ADL training;Balance training;Therapeutic exercise;Therapeutic activities;DME and/or AE instruction;Patient/family education;Energy conservation;Cognitive remediation/compensation    OT Goals(Current goals can be found in the care plan section) Acute Rehab OT Goals Patient Stated Goal: Return to prior level of function at home OT Goal Formulation: With patient Time For Goal Achievement: 06/26/17 Potential to Achieve Goals: Good ADL Goals Pt Will Perform Lower Body Dressing: with modified independence;sit to/from stand Pt Will Transfer to Toilet: with supervision;ambulating(LRAD for ambulation, comfort height toilet)  OT Frequency: Min 1X/week   Barriers to D/C:            Co-evaluation              AM-PAC PT "6 Clicks" Daily Activity     Outcome Measure Help from another person eating meals?: None Help from another person taking care of personal grooming?: None Help from another person toileting, which includes using toliet, bedpan, or urinal?: A Little Help from another person bathing (including washing, rinsing, drying)?: A Little Help from another person to put on and taking off regular upper body clothing?: None Help from another person to put on and taking off regular lower body clothing?: A Little 6 Click Score: 21   End of Session Equipment Utilized  During Treatment: Gait belt;Rolling walker  Activity Tolerance: Patient tolerated treatment well Patient left: in bed;with call bell/phone within reach;with bed alarm set;with family/visitor present  OT Visit Diagnosis: Other abnormalities of gait and mobility (R26.89);Repeated falls (R29.6);Muscle weakness (generalized) (M62.81)                Time: 7340-3709 OT Time Calculation (min): 27 min Charges:  OT General Charges $OT  Visit: 1 Visit OT Evaluation $OT Eval Low Complexity: 1 Low  Jeni Salles, MPH, MS, OTR/L ascom [phone] 06/12/17, 5:57 PM

## 2017-06-12 NOTE — Consult Note (Signed)
Referring Physician: Posey Pronto    Chief Complaint: RLE weakness  HPI: Caroline Aguilar is an 84 y.o. female with a history of  afib on Eliquis who reports awakening on yesterday with right lower extremity weakness.  She reports that when she was walking she felt as if she was dragging her right leg.  Her symptoms did not improve during the day and she presented for evaluation.  The patient is on Eliquis at home.  Reports that she missed 2 doses of Eliquis on the days prior to her presentation.  Initial NIH stroke scale of 0.  Date last known well: Date: 06/10/2017 Time last known well: Time: 20:00 tPA Given: No: Outside time window  Past Medical History:  Diagnosis Date  . A-fib ([place])   . Angina pectoris ([place])   . Cancer [facility])    Colon cancer  . Hyperlipidemia     Past Surgical History:  Procedure Laterality Date  . CARDIAC CATHETERIZATION N/A 07/19/2015   Procedure: Right and Left Heart Cath;  Surgeon: Isaias Cowman, MD;  Location: [place] [facility];  Service: Cardiovascular;  Laterality: N/A;  . CATARACT EXTRACTION Bilateral   . COLON SURGERY    . ESOPHAGOGASTRODUODENOSCOPY N/A 07/08/2015   Procedure: ESOPHAGOGASTRODUODENOSCOPY (EGD);  Surgeon: Hulen Luster, MD;  Location: [facility] ENDOSCOPY;  Service: Endoscopy;  Laterality: N/A;  . HIP SURGERY Bilateral     Family History  Problem Relation Age of Onset  . Cancer Mother   . Heart failure Father    Social History:  reports that  has never smoked. she has never used smokeless tobacco. She reports that she does not drink alcohol or use drugs.  Allergies:  Allergies  Allergen Reactions  . Penicillins Anaphylaxis and Other (See Comments)    Has patient had a PCN reaction causing immediate rash, facial/tongue/throat swelling, SOB or lightheadedness with hypotension: Yes Has patient had a PCN reaction causing severe rash involving mucus membranes or skin necrosis: No Has patient had a PCN reaction that required hospitalization  No Has patient had a PCN reaction occurring within the last 10 years: No If all of the above answers are "NO", then may proceed with Cephalosporin use.  . Azithromycin Other (See Comments)    Reaction:  Unknown   . Famciclovir Other (See Comments)    Reaction:  Unknown     Medications:  I have reviewed the patient's current medications. Prior to Admission:  Medications Prior to Admission  Medication Sig Dispense Refill Last Dose  . amiodarone (PACERONE) 200 MG tablet Take 1 tablet (200 mg total) by mouth daily. (Patient taking differently: Take 100 mg by mouth 2 (two) times daily. ) 30 tablet 0 Past Week at 0800  . apixaban (ELIQUIS) 2.5 MG TABS tablet Take 2.5 mg by mouth 2 (two) times daily.    Past Week at 0800  . Calcium Carbonate-Vitamin D (CALCIUM 600+D) 600-200 MG-UNIT TABS Take 1 tablet by mouth daily.   Past Week at 0800  . docusate sodium (COLACE) 100 MG capsule Take 100 mg by mouth daily. Reported on 08/05/2015   prn at prn  . latanoprost (XALATAN) 0.005 % ophthalmic solution Place 1 drop into both eyes at bedtime.   Past Week at 2000  . meloxicam (MOBIC) 7.5 MG tablet Take 7.5 mg by mouth daily as needed.  0 prn at prn  . pantoprazole (PROTONIX) 40 MG tablet Take 40 mg by mouth daily as needed.    Past Week at 0800  . polyethylene glycol (  MIRALAX / GLYCOLAX) packet Take 17 g by mouth daily as needed for mild constipation. 14 each 0 prn at prn  . potassium chloride SA (K-DUR,KLOR-CON) 20 MEQ tablet Take 40 mEq by mouth daily.   Past Week at 0800  . sertraline (ZOLOFT) 50 MG tablet Take 50 mg by mouth daily.   Past Week at 0800  . simvastatin (ZOCOR) 20 MG tablet Take 20 mg by mouth at bedtime.   Past Week at 2000  . timolol (TIMOPTIC) 0.5 % ophthalmic solution Place 1 drop into both eyes 2 (two) times daily.   Past Week at 0800  . feeding supplement, ENSURE ENLIVE, (ENSURE ENLIVE) LIQD Take 237 mLs by mouth 2 (two) times daily between meals. 237 mL 12 Taking   Scheduled: .  amiodarone  100 mg Oral BID  . apixaban  2.5 mg Oral BID  . latanoprost  1 drop Both Eyes QHS  . oxyCODONE  2.5 mg Oral Once  . pantoprazole  40 mg Oral Daily  . sertraline  50 mg Oral Daily  . simvastatin  20 mg Oral QHS  . timolol  1 drop Both Eyes BID    ROS: History obtained from the patient  General ROS: negative for - chills, fatigue, fever, night sweats, weight gain or weight loss Psychological ROS: memory difficulties Ophthalmic ROS: negative for - blurry vision, double vision, eye pain or loss of vision ENT ROS: negative for - epistaxis, nasal discharge, oral lesions, sore throat, tinnitus or vertigo Allergy and Immunology ROS: negative for - hives or itchy/watery eyes Hematological and Lymphatic ROS: negative for - bleeding problems, bruising or swollen lymph nodes Endocrine ROS: negative for - galactorrhea, hair pattern changes, polydipsia/polyuria or temperature intolerance Respiratory ROS: negative for - cough, hemoptysis, shortness of breath or wheezing Cardiovascular ROS: negative for - chest pain, dyspnea on exertion, edema or irregular heartbeat Gastrointestinal ROS: negative for - abdominal pain, diarrhea, hematemesis, nausea/vomiting or stool incontinence Genito-Urinary ROS: negative for - dysuria, hematuria, incontinence or urinary frequency/urgency Musculoskeletal ROS: negative for - joint swelling or muscular weakness Neurological ROS: as noted in HPI Dermatological ROS: negative for rash and skin lesion changes  Physical Examination: Blood pressure (!) 142/54, pulse 74, temperature 98.1 F (36.7 C), temperature source Oral, resp. rate 16, height 5\' 5"  (1.651 m), weight 74.8 kg (165 lb), SpO2 97 %.  HEENT-  Normocephalic, no lesions, without obvious abnormality.  Normal external eye and conjunctiva.  Normal TM's bilaterally.  Normal auditory canals and external ears. Normal external nose, mucus membranes and septum.  Normal pharynx. Cardiovascular- S1, S2  normal, pulses palpable throughout   Lungs- chest clear, no wheezing, rales, normal symmetric air entry Abdomen- soft, non-tender; bowel sounds normal; no masses,  no organomegaly Extremities- LE edema Lymph-no adenopathy palpable Musculoskeletal-no joint tenderness, deformity or swelling Skin-warm and dry, no hyperpigmentation, vitiligo, or suspicious lesions  Neurological Examination   Mental Status: Alert, oriented, thought content appropriate.  Speech fluent without evidence of aphasia.  Able to follow 3 step commands without difficulty. Cranial Nerves: II: Discs flat bilaterally; Visual fields grossly normal, pupils equal, round, reactive to light and accommodation III,IV, VI: ptosis not present, extra-ocular motions intact bilaterally V,VII: smile symmetric, facial light touch sensation normal bilaterally VIII: hearing normal bilaterally IX,X: gag reflex present XI: bilateral shoulder shrug XII: midline tongue extension Motor: Right : Upper extremity   5/5    Left:     Upper extremity   5/5  Lower extremity   4+/5  Lower extremity   5/5 Tone and bulk:normal tone throughout; no atrophy noted Sensory: Pinprick and light touch intact throughout, bilaterally Deep Tendon Reflexes: 2+ in the upper extremities and absent in the lower extremities Plantars: Right: downgoing   Left: downgoing Cerebellar: Normal finger-to-nose and normal heel-to-shin testing bilaterally Gait: not tested due to safety concerns    Laboratory Studies:  Basic Metabolic Panel: Recent Labs  Lab 06/11/17 1400  NA 131*  K 3.8  CL 96*  CO2 26  GLUCOSE 133*  BUN 16  CREATININE 0.77  CALCIUM 8.8*    Liver Function Tests: Recent Labs  Lab 06/11/17 1400  AST 22  ALT 12*  ALKPHOS 70  BILITOT 0.6  PROT 7.1  ALBUMIN 3.8   No results for input(s): LIPASE, AMYLASE in the last 168 hours. No results for input(s): AMMONIA in the last 168 hours.  CBC: Recent Labs  Lab 06/11/17 1400  WBC 3.8   NEUTROABS 2.3  HGB 13.9  HCT 41.9  MCV 88.4  PLT 150    Cardiac Enzymes: No results for input(s): CKTOTAL, CKMB, CKMBINDEX, TROPONINI in the last 168 hours.  BNP: Invalid input(s): POCBNP  CBG: No results for input(s): GLUCAP in the last 168 hours.  Microbiology: Results for orders placed or performed during the hospital encounter of 07/02/15  Blood culture (routine x 2)     Status: None   Collection Time: 07/02/15  4:13 PM  Result Value Ref Range Status   Specimen Description BLOOD RT [facility]  Final   Special Requests BOTTLES DRAWN AEROBIC AND ANAEROBIC 5CC  Final   Culture NO GROWTH 5 DAYS  Final   Report Status 07/07/2015 FINAL  Final  Blood culture (routine x 2)     Status: None   Collection Time: 07/02/15  4:14 PM  Result Value Ref Range Status   Specimen Description BLOOD LT AC  Final   Special Requests BOTTLES DRAWN AEROBIC AND ANAEROBIC AER9CC [place]  Final   Culture NO GROWTH 5 DAYS  Final   Report Status 07/07/2015 FINAL  Final  Urine culture     Status: None   Collection Time: 07/02/15  6:24 PM  Result Value Ref Range Status   Specimen Description URINE, RANDOM  Final   Special Requests NONE  Final   Culture >=100,000 COLONIES/mL ESCHERICHIA COLI  Final   Report Status 07/05/2015 FINAL  Final   Organism ID, Bacteria ESCHERICHIA COLI  Final      Susceptibility   Escherichia coli - MIC*    AMPICILLIN >=32 RESISTANT Resistant     CEFAZOLIN <=4 SENSITIVE Sensitive     CEFTRIAXONE <=1 SENSITIVE Sensitive     CIPROFLOXACIN >=4 RESISTANT Resistant     GENTAMICIN <=1 SENSITIVE Sensitive     IMIPENEM <=0.25 SENSITIVE Sensitive     NITROFURANTOIN <=16 SENSITIVE Sensitive     TRIMETH/SULFA >=320 RESISTANT Resistant     AMPICILLIN/SULBACTAM 16 INTERMEDIATE Intermediate     PIP/TAZO <=4 SENSITIVE Sensitive     Extended ESBL NEGATIVE Sensitive     * >=100,000 COLONIES/mL ESCHERICHIA COLI  Culture, body fluid-bottle     Status: None   Collection Time: 07/05/15 10:05 AM   Result Value Ref Range Status   Specimen Description PLEURAL  Final   Special Requests NONE  Final   Culture NO GROWTH 5 DAYS  Final   Report Status 07/10/2015 FINAL  Final    Coagulation Studies: Recent Labs    06/11/17 1400  LABPROT 12.3  INR 0.92  Urinalysis: No results for input(s): COLORURINE, LABSPEC, PHURINE, GLUCOSEU, HGBUR, BILIRUBINUR, KETONESUR, PROTEINUR, UROBILINOGEN, NITRITE, LEUKOCYTESUR in the last 168 hours.  Invalid input(s): APPERANCEUR  Lipid Panel:    Component Value Date/Time   CHOL 185 06/12/2017 0343   TRIG 164 (H) 06/12/2017 0343   HDL 50 06/12/2017 0343   CHOLHDL 3.7 06/12/2017 0343   VLDL 33 06/12/2017 0343   LDLCALC 102 (H) 06/12/2017 0343    HgbA1C: No results found for: HGBA1C  Urine Drug Screen:  No results found for: LABOPIA, COCAINSCRNUR, LABBENZ, AMPHETMU, THCU, LABBARB  Alcohol Level: No results for input(s): ETH in the last 168 hours.  Other results: EKG: Sinus rhythm at 62 bpm.  Imaging: Ct Head Wo Contrast  Result Date: 06/11/2017 CLINICAL DATA:  85 year old female post fall 4 days ago. Awoke this morning with right leg numbness and now with left leg numbness. Neck pain. Initial encounter. EXAM: CT HEAD WITHOUT CONTRAST CT CERVICAL SPINE WITHOUT CONTRAST TECHNIQUE: Multidetector CT imaging of the head and cervical spine was performed following the standard protocol without intravenous contrast. Multiplanar CT image reconstructions of the cervical spine were also generated. COMPARISON:  09/02/2016 CT head and cervical spine. FINDINGS: CT HEAD FINDINGS Brain: No intracranial hemorrhage or CT evidence of large acute infarct. Chronic microvascular changes. Atrophy No intracranial mass lesion noted on this unenhanced exam. Vascular: Vascular calcifications. Skull: No skull fracture. Sinuses/Orbits: No acute orbital abnormality. Opacification right frontal sinus and ethmoid sinus air cells. Mild mucosal thickening right maxillary sinus.  Other: Mastoid air cells and middle ear cavities are clear. CT CERVICAL SPINE FINDINGS Alignment: Alignment similar to prior exam. Skull base and vertebrae: No cervical spine fracture. Soft tissues and spinal canal: No abnormal prevertebral soft tissue swelling. Disc levels: Cervical spondylotic changes C3-4 thru C6-7 with mild spinal stenosis and slight cord flattening C3-4 and C5-6 level similar to prior exam. Upper chest: No worrisome lung apical lesion. Other: Carotid bifurcation calcifications. Slightly heterogeneous thyroid gland. IMPRESSION: CT HEAD No skull fracture or intracranial hemorrhage. Chronic microvascular changes. Atrophy. Opacification right frontal sinus and ethmoid sinus air cells. Mild mucosal thickening right maxillary sinus. CT CERVICAL SPINE Alignment similar to prior exam. No cervical spine fracture or abnormal prevertebral soft tissue swelling. Cervical spondylotic changes C3-4 thru C6-7 with mild spinal stenosis and slight cord flattening C3-4 and C5-6 level similar to prior exam. Electronically Signed   By: Genia Del M.D.   On: 06/11/2017 14:57   Ct Cervical Spine Wo Contrast  Result Date: 06/11/2017 CLINICAL DATA:  84 year old female post fall 4 days ago. Awoke this morning with right leg numbness and now with left leg numbness. Neck pain. Initial encounter. EXAM: CT HEAD WITHOUT CONTRAST CT CERVICAL SPINE WITHOUT CONTRAST TECHNIQUE: Multidetector CT imaging of the head and cervical spine was performed following the standard protocol without intravenous contrast. Multiplanar CT image reconstructions of the cervical spine were also generated. COMPARISON:  09/02/2016 CT head and cervical spine. FINDINGS: CT HEAD FINDINGS Brain: No intracranial hemorrhage or CT evidence of large acute infarct. Chronic microvascular changes. Atrophy No intracranial mass lesion noted on this unenhanced exam. Vascular: Vascular calcifications. Skull: No skull fracture. Sinuses/Orbits: No acute orbital  abnormality. Opacification right frontal sinus and ethmoid sinus air cells. Mild mucosal thickening right maxillary sinus. Other: Mastoid air cells and middle ear cavities are clear. CT CERVICAL SPINE FINDINGS Alignment: Alignment similar to prior exam. Skull base and vertebrae: No cervical spine fracture. Soft tissues and spinal canal: No abnormal prevertebral soft tissue swelling.  Disc levels: Cervical spondylotic changes C3-4 thru C6-7 with mild spinal stenosis and slight cord flattening C3-4 and C5-6 level similar to prior exam. Upper chest: No worrisome lung apical lesion. Other: Carotid bifurcation calcifications. Slightly heterogeneous thyroid gland. IMPRESSION: CT HEAD No skull fracture or intracranial hemorrhage. Chronic microvascular changes. Atrophy. Opacification right frontal sinus and ethmoid sinus air cells. Mild mucosal thickening right maxillary sinus. CT CERVICAL SPINE Alignment similar to prior exam. No cervical spine fracture or abnormal prevertebral soft tissue swelling. Cervical spondylotic changes C3-4 thru C6-7 with mild spinal stenosis and slight cord flattening C3-4 and C5-6 level similar to prior exam. Electronically Signed   By: Genia Del M.D.   On: 06/11/2017 14:57   Mr Brain Wo Contrast  Result Date: 06/11/2017 CLINICAL DATA:  Recent fall. Now with neck pain and bilateral lower extremity numbness. EXAM: MRI HEAD WITHOUT CONTRAST MRI CERVICAL SPINE WITHOUT CONTRAST TECHNIQUE: Multiplanar, multiecho pulse sequences of the brain and surrounding structures, and cervical spine, to include the craniocervical junction and cervicothoracic junction, were obtained without intravenous contrast. COMPARISON:  Head CT 06/11/2017 FINDINGS: MRI HEAD FINDINGS Brain: The midline structures are normal. Small focus of abnormal diffusion weighted signal in the left corona radiata. No mass lesion, hydrocephalus, dural abnormality or extra-axial collection. Multifocal white matter hyperintensity, most  commonly due to chronic ischemic microangiopathy. No age-advanced or lobar predominant atrophy. 3-5 peripherally scattered chronic micro hemorrhagic foci. Vascular: Major intracranial arterial and venous sinus flow voids are preserved. Skull: Normal marrow signal. Sinuses/Orbits: No fluid levels or advanced mucosal thickening. No mastoid or middle ear effusion. Normal orbits. MRI CERVICAL SPINE FINDINGS Alignment: Mild reversal of normal cervical lordosis. Grade 1 retrolisthesis at C5-6 and C6-7. Vertebrae: No fracture, evidence of discitis, or bone lesion. Cord: Normal signal and morphology. Posterior Fossa, vertebral arteries, paraspinal tissues: Visualized posterior fossa is normal. Vertebral artery flow voids are preserved. No prevertebral soft tissue swelling. Disc levels: C1-C2: Normal. C2-C3: Normal disc space and facets. No spinal canal or neuroforaminal stenosis. C3-C4: Mild left neural foraminal stenosis due to uncovertebral and facet hypertrophy. C4-C5: Left-greater-than-right facet hypertrophy with mild left uncovertebral hypertrophy resulting in mild-to-moderate left neural foraminal stenosis. C5-C6: Disc space narrowing with small disc osteophyte complex moderately narrowing both neural foramina. Mild central spinal canal stenosis. C6-C7: Small disc osteophyte complex with mild bilateral foraminal narrowing. Mild central spinal canal stenosis. C7-T1: Normal disc space and facets. No spinal canal or neuroforaminal stenosis. IMPRESSION: 1. Small focus of abnormal DWI signal in the left corona radiata is consistent with an acute or early subacute infarct. No acute hemorrhage or mass effect. 2. Nonspecific 3-5 peripherally scattered foci of chronic microhemorrhage. 3. No high-grade spinal canal stenosis or cervical cord compression. 4. Multilevel mild spinal canal and mild-to-moderate neural foraminal stenosis. Electronically Signed   By: Ulyses Jarred M.D.   On: 06/11/2017 20:46   Mr Cervical Spine Wo  Contrast  Result Date: 06/11/2017 CLINICAL DATA:  Recent fall. Now with neck pain and bilateral lower extremity numbness. EXAM: MRI HEAD WITHOUT CONTRAST MRI CERVICAL SPINE WITHOUT CONTRAST TECHNIQUE: Multiplanar, multiecho pulse sequences of the brain and surrounding structures, and cervical spine, to include the craniocervical junction and cervicothoracic junction, were obtained without intravenous contrast. COMPARISON:  Head CT 06/11/2017 FINDINGS: MRI HEAD FINDINGS Brain: The midline structures are normal. Small focus of abnormal diffusion weighted signal in the left corona radiata. No mass lesion, hydrocephalus, dural abnormality or extra-axial collection. Multifocal white matter hyperintensity, most commonly due to chronic ischemic microangiopathy. No  age-advanced or lobar predominant atrophy. 3-5 peripherally scattered chronic micro hemorrhagic foci. Vascular: Major intracranial arterial and venous sinus flow voids are preserved. Skull: Normal marrow signal. Sinuses/Orbits: No fluid levels or advanced mucosal thickening. No mastoid or middle ear effusion. Normal orbits. MRI CERVICAL SPINE FINDINGS Alignment: Mild reversal of normal cervical lordosis. Grade 1 retrolisthesis at C5-6 and C6-7. Vertebrae: No fracture, evidence of discitis, or bone lesion. Cord: Normal signal and morphology. Posterior Fossa, vertebral arteries, paraspinal tissues: Visualized posterior fossa is normal. Vertebral artery flow voids are preserved. No prevertebral soft tissue swelling. Disc levels: C1-C2: Normal. C2-C3: Normal disc space and facets. No spinal canal or neuroforaminal stenosis. C3-C4: Mild left neural foraminal stenosis due to uncovertebral and facet hypertrophy. C4-C5: Left-greater-than-right facet hypertrophy with mild left uncovertebral hypertrophy resulting in mild-to-moderate left neural foraminal stenosis. C5-C6: Disc space narrowing with small disc osteophyte complex moderately narrowing both neural foramina.  Mild central spinal canal stenosis. C6-C7: Small disc osteophyte complex with mild bilateral foraminal narrowing. Mild central spinal canal stenosis. C7-T1: Normal disc space and facets. No spinal canal or neuroforaminal stenosis. IMPRESSION: 1. Small focus of abnormal DWI signal in the left corona radiata is consistent with an acute or early subacute infarct. No acute hemorrhage or mass effect. 2. Nonspecific 3-5 peripherally scattered foci of chronic microhemorrhage. 3. No high-grade spinal canal stenosis or cervical cord compression. 4. Multilevel mild spinal canal and mild-to-moderate neural foraminal stenosis. Electronically Signed   By: Ulyses Jarred M.D.   On: 06/11/2017 20:46    Assessment: 84 y.o. female with a history of atrial fibrillation on Eliquis with recent missing of doses who presents with right lower extremity weakness of acute onset.  Head CT reviewed and shows no acute changes.  MRI of the brain reviewed and shows a small acute infarct in the left corona radiata.  Etiology likely embolic.  Echocardiogram pending.  Carotid Dopplers shows a 50-69% stenosis in the right internal carotid artery and a less than 50% stenosis in the left internal carotid artery.  A1c pending.  LDL 102.  Stroke Risk Factors - atrial fibrillation and hyperlipidemia  Plan: 1. PT consult, OT consult, Speech consult 2. Prophylactic therapy-continue Eliquis 3. NPO until RN stroke swallow screen 4. Telemetry monitoring 5. Frequent neuro checks 6.  Aggressive lipid management with target LDL<70.   Alexis Goodell, MD Neurology [phone] 06/12/2017, 12:52 PM

## 2017-06-12 NOTE — Progress Notes (Signed)
[place] at [facility]                                                                                                                                                                                  Patient Demographics   Caroline Aguilar, is a 84 y.o. female, DOB - 02-Jun-1925, GQQ:[id number]  Admit date - 06/11/2017   Admitting Physician Lance Coon, MD  Outpatient Primary MD for the patient is [name], MD   LOS - 0  Subjective: Patient feeling little better feel stronger Seen by physical therapy    Review of Systems:   CONSTITUTIONAL: No documented fever.  Positive fatigue, positive weakness. No weight gain, no weight loss.  EYES: No blurry or double vision.  ENT: No tinnitus. No postnasal drip. No redness of the oropharynx.  RESPIRATORY: No cough, no wheeze, no hemoptysis. No dyspnea.  CARDIOVASCULAR: No chest pain. No orthopnea. No palpitations. No syncope.  GASTROINTESTINAL: No nausea, no vomiting or diarrhea. No abdominal pain. No melena or hematochezia.  GENITOURINARY: No dysuria or hematuria.  ENDOCRINE: No polyuria or nocturia. No heat or cold intolerance.  HEMATOLOGY: No anemia. No bruising. No bleeding.  INTEGUMENTARY: No rashes. No lesions.  MUSCULOSKELETAL: No arthritis. No swelling. No gout.  NEUROLOGIC: No numbness, tingling, or ataxia. No seizure-type activity.  PSYCHIATRIC: No anxiety. No insomnia. No ADD.    Vitals:   Vitals:   06/12/17 0606 06/12/17 0800 06/12/17 1000 06/12/17 1332  BP: (!) 136/58 (!) 151/69 (!) 142/54 (!) 180/63  Pulse: 64 61 74 (!) 58  Resp: 16 18 16 18   Temp: 97.6 F (36.4 C) 98.5 F (36.9 C) 98.1 F (36.7 C) 98.8 F (37.1 C)  TempSrc: Oral Oral Oral   SpO2: 97% 96% 97% 99%  Weight:      Height:        Wt Readings from Last 3 Encounters:  06/11/17 165 lb (74.8 kg)  09/02/16 163 lb 8 oz (74.2 kg)  08/05/15 157 lb (71.2 kg)    No intake or output data in the 24 hours ending 06/12/17  1357  Physical Exam:   GENERAL: Pleasant-appearing in no apparent distress.  HEAD, EYES, EARS, NOSE AND THROAT: Atraumatic, normocephalic. Extraocular muscles are intact. Pupils equal and reactive to light. Sclerae anicteric. No conjunctival injection. No oro-pharyngeal erythema.  NECK: Supple. There is no jugular venous distention. No bruits, no lymphadenopathy, no thyromegaly.  HEART: Regular rate and rhythm,. No murmurs, no rubs, no clicks.  LUNGS: Clear to auscultation bilaterally. No rales or rhonchi. No wheezes.  ABDOMEN: Soft, flat, nontender, nondistended. Has good bowel sounds. No hepatosplenomegaly appreciated.  EXTREMITIES: No evidence of any cyanosis,  clubbing, or peripheral edema.  +2 pedal and radial pulses bilaterally.  NEUROLOGIC: The patient is alert, awake, and oriented x3 with no focal motor or sensory deficits appreciated bilaterally.  SKIN: Moist and warm with no rashes appreciated.  Psych: Not anxious, depressed LN: No inguinal LN enlargement    Antibiotics   Anti-infectives (From admission, onward)   None      Medications   Scheduled Meds: . amiodarone  100 mg Oral BID  . apixaban  2.5 mg Oral BID  . latanoprost  1 drop Both Eyes QHS  . oxyCODONE  2.5 mg Oral Once  . pantoprazole  40 mg Oral Daily  . sertraline  50 mg Oral Daily  . simvastatin  20 mg Oral QHS  . timolol  1 drop Both Eyes BID   Continuous Infusions: PRN Meds:.acetaminophen **OR** acetaminophen (TYLENOL) oral liquid 160 mg/5 mL **OR** acetaminophen   Data Review:   Micro Results No results found for this or any previous visit (from the past 240 hour(s)).  Radiology Reports Ct Head Wo Contrast  Result Date: 06/11/2017 CLINICAL DATA:  84 year old female post fall 4 days ago. Awoke this morning with right leg numbness and now with left leg numbness. Neck pain. Initial encounter. EXAM: CT HEAD WITHOUT CONTRAST CT CERVICAL SPINE WITHOUT CONTRAST TECHNIQUE: Multidetector CT imaging of  the head and cervical spine was performed following the standard protocol without intravenous contrast. Multiplanar CT image reconstructions of the cervical spine were also generated. COMPARISON:  09/02/2016 CT head and cervical spine. FINDINGS: CT HEAD FINDINGS Brain: No intracranial hemorrhage or CT evidence of large acute infarct. Chronic microvascular changes. Atrophy No intracranial mass lesion noted on this unenhanced exam. Vascular: Vascular calcifications. Skull: No skull fracture. Sinuses/Orbits: No acute orbital abnormality. Opacification right frontal sinus and ethmoid sinus air cells. Mild mucosal thickening right maxillary sinus. Other: Mastoid air cells and middle ear cavities are clear. CT CERVICAL SPINE FINDINGS Alignment: Alignment similar to prior exam. Skull base and vertebrae: No cervical spine fracture. Soft tissues and spinal canal: No abnormal prevertebral soft tissue swelling. Disc levels: Cervical spondylotic changes C3-4 thru C6-7 with mild spinal stenosis and slight cord flattening C3-4 and C5-6 level similar to prior exam. Upper chest: No worrisome lung apical lesion. Other: Carotid bifurcation calcifications. Slightly heterogeneous thyroid gland. IMPRESSION: CT HEAD No skull fracture or intracranial hemorrhage. Chronic microvascular changes. Atrophy. Opacification right frontal sinus and ethmoid sinus air cells. Mild mucosal thickening right maxillary sinus. CT CERVICAL SPINE Alignment similar to prior exam. No cervical spine fracture or abnormal prevertebral soft tissue swelling. Cervical spondylotic changes C3-4 thru C6-7 with mild spinal stenosis and slight cord flattening C3-4 and C5-6 level similar to prior exam. Electronically Signed   By: Genia Del M.D.   On: 06/11/2017 14:57   Ct Cervical Spine Wo Contrast  Result Date: 06/11/2017 CLINICAL DATA:  84 year old female post fall 4 days ago. Awoke this morning with right leg numbness and now with left leg numbness. Neck pain.  Initial encounter. EXAM: CT HEAD WITHOUT CONTRAST CT CERVICAL SPINE WITHOUT CONTRAST TECHNIQUE: Multidetector CT imaging of the head and cervical spine was performed following the standard protocol without intravenous contrast. Multiplanar CT image reconstructions of the cervical spine were also generated. COMPARISON:  09/02/2016 CT head and cervical spine. FINDINGS: CT HEAD FINDINGS Brain: No intracranial hemorrhage or CT evidence of large acute infarct. Chronic microvascular changes. Atrophy No intracranial mass lesion noted on this unenhanced exam. Vascular: Vascular calcifications. Skull: No skull fracture. Sinuses/Orbits:  No acute orbital abnormality. Opacification right frontal sinus and ethmoid sinus air cells. Mild mucosal thickening right maxillary sinus. Other: Mastoid air cells and middle ear cavities are clear. CT CERVICAL SPINE FINDINGS Alignment: Alignment similar to prior exam. Skull base and vertebrae: No cervical spine fracture. Soft tissues and spinal canal: No abnormal prevertebral soft tissue swelling. Disc levels: Cervical spondylotic changes C3-4 thru C6-7 with mild spinal stenosis and slight cord flattening C3-4 and C5-6 level similar to prior exam. Upper chest: No worrisome lung apical lesion. Other: Carotid bifurcation calcifications. Slightly heterogeneous thyroid gland. IMPRESSION: CT HEAD No skull fracture or intracranial hemorrhage. Chronic microvascular changes. Atrophy. Opacification right frontal sinus and ethmoid sinus air cells. Mild mucosal thickening right maxillary sinus. CT CERVICAL SPINE Alignment similar to prior exam. No cervical spine fracture or abnormal prevertebral soft tissue swelling. Cervical spondylotic changes C3-4 thru C6-7 with mild spinal stenosis and slight cord flattening C3-4 and C5-6 level similar to prior exam. Electronically Signed   By: Genia Del M.D.   On: 06/11/2017 14:57   Mr Brain Wo Contrast  Result Date: 06/11/2017 CLINICAL DATA:  Recent fall.  Now with neck pain and bilateral lower extremity numbness. EXAM: MRI HEAD WITHOUT CONTRAST MRI CERVICAL SPINE WITHOUT CONTRAST TECHNIQUE: Multiplanar, multiecho pulse sequences of the brain and surrounding structures, and cervical spine, to include the craniocervical junction and cervicothoracic junction, were obtained without intravenous contrast. COMPARISON:  Head CT 06/11/2017 FINDINGS: MRI HEAD FINDINGS Brain: The midline structures are normal. Small focus of abnormal diffusion weighted signal in the left corona radiata. No mass lesion, hydrocephalus, dural abnormality or extra-axial collection. Multifocal white matter hyperintensity, most commonly due to chronic ischemic microangiopathy. No age-advanced or lobar predominant atrophy. 3-5 peripherally scattered chronic micro hemorrhagic foci. Vascular: Major intracranial arterial and venous sinus flow voids are preserved. Skull: Normal marrow signal. Sinuses/Orbits: No fluid levels or advanced mucosal thickening. No mastoid or middle ear effusion. Normal orbits. MRI CERVICAL SPINE FINDINGS Alignment: Mild reversal of normal cervical lordosis. Grade 1 retrolisthesis at C5-6 and C6-7. Vertebrae: No fracture, evidence of discitis, or bone lesion. Cord: Normal signal and morphology. Posterior Fossa, vertebral arteries, paraspinal tissues: Visualized posterior fossa is normal. Vertebral artery flow voids are preserved. No prevertebral soft tissue swelling. Disc levels: C1-C2: Normal. C2-C3: Normal disc space and facets. No spinal canal or neuroforaminal stenosis. C3-C4: Mild left neural foraminal stenosis due to uncovertebral and facet hypertrophy. C4-C5: Left-greater-than-right facet hypertrophy with mild left uncovertebral hypertrophy resulting in mild-to-moderate left neural foraminal stenosis. C5-C6: Disc space narrowing with small disc osteophyte complex moderately narrowing both neural foramina. Mild central spinal canal stenosis. C6-C7: Small disc osteophyte  complex with mild bilateral foraminal narrowing. Mild central spinal canal stenosis. C7-T1: Normal disc space and facets. No spinal canal or neuroforaminal stenosis. IMPRESSION: 1. Small focus of abnormal DWI signal in the left corona radiata is consistent with an acute or early subacute infarct. No acute hemorrhage or mass effect. 2. Nonspecific 3-5 peripherally scattered foci of chronic microhemorrhage. 3. No high-grade spinal canal stenosis or cervical cord compression. 4. Multilevel mild spinal canal and mild-to-moderate neural foraminal stenosis. Electronically Signed   By: Ulyses Jarred M.D.   On: 06/11/2017 20:46   Mr Cervical Spine Wo Contrast  Result Date: 06/11/2017 CLINICAL DATA:  Recent fall. Now with neck pain and bilateral lower extremity numbness. EXAM: MRI HEAD WITHOUT CONTRAST MRI CERVICAL SPINE WITHOUT CONTRAST TECHNIQUE: Multiplanar, multiecho pulse sequences of the brain and surrounding structures, and cervical spine, to include the  craniocervical junction and cervicothoracic junction, were obtained without intravenous contrast. COMPARISON:  Head CT 06/11/2017 FINDINGS: MRI HEAD FINDINGS Brain: The midline structures are normal. Small focus of abnormal diffusion weighted signal in the left corona radiata. No mass lesion, hydrocephalus, dural abnormality or extra-axial collection. Multifocal white matter hyperintensity, most commonly due to chronic ischemic microangiopathy. No age-advanced or lobar predominant atrophy. 3-5 peripherally scattered chronic micro hemorrhagic foci. Vascular: Major intracranial arterial and venous sinus flow voids are preserved. Skull: Normal marrow signal. Sinuses/Orbits: No fluid levels or advanced mucosal thickening. No mastoid or middle ear effusion. Normal orbits. MRI CERVICAL SPINE FINDINGS Alignment: Mild reversal of normal cervical lordosis. Grade 1 retrolisthesis at C5-6 and C6-7. Vertebrae: No fracture, evidence of discitis, or bone lesion. Cord: Normal  signal and morphology. Posterior Fossa, vertebral arteries, paraspinal tissues: Visualized posterior fossa is normal. Vertebral artery flow voids are preserved. No prevertebral soft tissue swelling. Disc levels: C1-C2: Normal. C2-C3: Normal disc space and facets. No spinal canal or neuroforaminal stenosis. C3-C4: Mild left neural foraminal stenosis due to uncovertebral and facet hypertrophy. C4-C5: Left-greater-than-right facet hypertrophy with mild left uncovertebral hypertrophy resulting in mild-to-moderate left neural foraminal stenosis. C5-C6: Disc space narrowing with small disc osteophyte complex moderately narrowing both neural foramina. Mild central spinal canal stenosis. C6-C7: Small disc osteophyte complex with mild bilateral foraminal narrowing. Mild central spinal canal stenosis. C7-T1: Normal disc space and facets. No spinal canal or neuroforaminal stenosis. IMPRESSION: 1. Small focus of abnormal DWI signal in the left corona radiata is consistent with an acute or early subacute infarct. No acute hemorrhage or mass effect. 2. Nonspecific 3-5 peripherally scattered foci of chronic microhemorrhage. 3. No high-grade spinal canal stenosis or cervical cord compression. 4. Multilevel mild spinal canal and mild-to-moderate neural foraminal stenosis. Electronically Signed   By: Ulyses Jarred M.D.   On: 06/11/2017 20:46   US Carotid Bilateral (at [facility] Only)  Result Date: 06/12/2017 CLINICAL DATA:  Stroke EXAM: BILATERAL CAROTID DUPLEX ULTRASOUND TECHNIQUE: Pearline Cables scale imaging, color Doppler and duplex ultrasound were performed of bilateral carotid and vertebral arteries in the neck. COMPARISON:  None. FINDINGS: Criteria: Quantification of carotid stenosis is based on velocity parameters that correlate the residual internal carotid diameter with NASCET-based stenosis levels, using the diameter of the distal internal carotid lumen as the denominator for stenosis measurement. The following velocity  measurements were obtained: RIGHT ICA:  131 cm/sec CCA:  798 cm/sec SYSTOLIC ICA/CCA RATIO:  1.0 DIASTOLIC ICA/CCA RATIO:  1.1 ECA:  86 cm/sec LEFT ICA:  65 cm/sec CCA:  95 cm/sec SYSTOLIC ICA/CCA RATIO:  0.7 DIASTOLIC ICA/CCA RATIO:  1.1 ECA:  72 cm/sec RIGHT CAROTID ARTERY: Moderate focal calcified plaque in the bulb. Low resistance internal carotid Doppler pattern. RIGHT VERTEBRAL ARTERY:  Antegrade. LEFT CAROTID ARTERY: Mild calcified plaque in the bulb. Low resistance internal carotid Doppler pattern. LEFT VERTEBRAL ARTERY:  Antegrade. IMPRESSION: There is 50-69% stenosis in the right internal carotid artery. Less than 50% stenosis in the left internal carotid artery. Electronically Signed   By: Marybelle Killings M.D.   On: 06/12/2017 13:08     CBC Recent Labs  Lab 06/11/17 1400  WBC 3.8  HGB 13.9  HCT 41.9  PLT 150  MCV 88.4  MCH 29.3  MCHC 33.2  RDW 13.3  LYMPHSABS 0.9*  MONOABS 0.5  EOSABS 0.1  BASOSABS 0.0    Chemistries  Recent Labs  Lab 06/11/17 1400  NA 131*  K 3.8  CL 96*  CO2 26  GLUCOSE 133*  BUN 16  CREATININE 0.77  CALCIUM 8.8*  AST 22  ALT 12*  ALKPHOS 70  BILITOT 0.6   ------------------------------------------------------------------------------------------------------------------ estimated creatinine clearance is 48.2 mL/min (by C-G formula based on SCr of 0.77 mg/dL). ------------------------------------------------------------------------------------------------------------------ No results for input(s): HGBA1C in the last 72 hours. ------------------------------------------------------------------------------------------------------------------ Recent Labs    06/12/17 0343  CHOL 185  HDL 50  LDLCALC 102*  TRIG 164*  CHOLHDL 3.7   ------------------------------------------------------------------------------------------------------------------ No results for input(s): TSH, T4TOTAL, T3FREE, THYROIDAB in the last 72 hours.  Invalid input(s):  FREET3 ------------------------------------------------------------------------------------------------------------------ No results for input(s): VITAMINB12, FOLATE, FERRITIN, TIBC, IRON, RETICCTPCT in the last 72 hours.  Coagulation profile Recent Labs  Lab 06/11/17 1400  INR 0.92    No results for input(s): DDIMER in the last 72 hours.  Cardiac Enzymes No results for input(s): CKMB, TROPONINI, MYOGLOBIN in the last 168 hours.  Invalid input(s): CK ------------------------------------------------------------------------------------------------------------------ Invalid input(s): Rheems  Patient is a 84 year old presented with right leg weakness   1.  Acute stroke ([place]) -left corona radiata stroke seen in MRI imaging  Patient has a history of atrial fibrillation continue Eliquis Await carotid Dopplers and echo results  2.   AF (paroxysmal atrial fibrillation) (HCC) - Continue amiodarone and Eliquis  3.  Hyperlipidemia continue Zocor  4.  Glaucoma continue eyedrops  5.  Generalized weakness and deconditioning patient will likely need rehab  6. dvt proph: eliquis     Code Status Orders  (From admission, onward)        Start     Ordered   06/12/17 0447  Full code  Continuous     06/12/17 0446    Code Status History    Date Active Date Inactive Code Status Order ID Comments User Context   06/12/2017 03:44 06/12/2017 04:46 DNR [id number]  Lance Coon, MD Inpatient   09/02/2016 22:15 09/05/2016 20:20 DNR [id number]  Dustin Flock, MD Inpatient   07/15/2015 04:39 07/20/2015 16:00 Full Code [id number]  Saundra Shelling, MD ED   07/02/2015 19:03 07/09/2015 18:12 Full Code [id number]  Lytle Butte, MD ED   06/19/2015 03:15 06/20/2015 16:31 Full Code [id number]  Saundra Shelling, MD Inpatient    Advance Directive Documentation     Most Recent Value  Type of Advance Directive  Healthcare Power of Attorney  Pre-existing out of facility DNR order (yellow form or  pink MOST form)  No data  "MOST" Form in Place?  No data           Consults  neuro  DVT Prophylaxis eliquis  Lab Results  Component Value Date   PLT 150 06/11/2017     Time Spent in minutes   34min Greater than 50% of time spent in care coordination and counseling patient regarding the condition and plan of care.   Dustin Flock M.D on 06/12/2017 at 1:57 PM  Between 7am to 6pm - Pager - [phone]  After 6pm go to [email or web address] - password [email or web address] [facility]   Office  [phone]

## 2017-06-12 NOTE — Progress Notes (Signed)
Physical Therapy Evaluation Patient Details Name: Caroline Aguilar MRN: [id number] DOB: 12-07-1925 Today's Date: 06/12/2017   History of Present Illness  Caroline Aguilar  is a 84 y.o. female who presents with right leg weakness.  Patient states that she woke up from sleep and had the symptoms.  Patient is on Eliquis for paroxysmal A. fib.  She states that she missed 2 doses the day prior to initiation of symptoms.  She was evaluated for stroke and MRI showed L corona radiata CVA.  Hospitalist were called for admission.  Clinical Impression  Pt admitted with above diagnosis. Pt currently with functional limitations due to the deficits listed below (see PT Problem List). Pt with RUE shoulder flexion/abduction as well as elbow extension weakness. 4-/5 R ankle dorsiflexion weakness. Peripheral neuropathy but no focal sensory deficits. Pt is modified independent for bed mobility. CGA for transfers and ambulation. She is able to ambulate a full lap around RN station with therapist. Gait is slow and pt requires intermittent assist to direct walker and cues for safety. No overt LOB requiring external assist from therapist during ambulation. VSS with ambulation. Pt demonstrates some decreased R ankle dorsiflexion with gait. She does experience some left lateral leaning in sitting. She is able to balance with feet apart and no UE support. Requires UE support to place feet together. Increased sway once in position but does not fall. Positive Romberg. Unable to attempt single leg balance. Denies changes in visual field and peripheral vision intact. Pt lives alone and has had multiple falls with fractures. She is at a very high risk for repeat falls especially in light of her new deficits. She would benefit from SNF placement at discharge to facilitate safe transition back home. It is likely that she will need higher level of baseline care in the near future either by family or at ALF given history of falls with injuries  as well as her high fall risk. Pt will benefit from PT services to address deficits in strength, balance, and mobility in order to return to full function at home.       Follow Up Recommendations SNF    Equipment Recommendations  None recommended by PT;Other (comment)(Continued use of her rolling walker)    Recommendations for Other Services OT consult     Precautions / Restrictions Precautions Precautions: Fall Restrictions Weight Bearing Restrictions: No      Mobility  Bed Mobility Overal bed mobility: Modified Independent             General bed mobility comments: Pt requires increase in time with HOB elevated and use of bed rails  Transfers Overall transfer level: Needs assistance Equipment used: Rolling walker (2 wheeled) Transfers: Sit to/from Stand Sit to Stand: Min guard         General transfer comment: Pt is able to complete transfers with safe hand placement. Increased time required to come to standing. Stable with bilateral UE support on rolling walker  Ambulation/Gait Ambulation/Gait assistance: Min assist Ambulation Distance (Feet): 200 Feet Assistive device: Rolling walker (2 wheeled)   Gait velocity: Decreased Gait velocity interpretation: <1.8 ft/sec, indicative of risk for recurrent falls General Gait Details: Pt is able to ambulate a full lap around RN station with therapist. Gait is slow and pt requires intermittent assist to direct walker and cues for safety. No overt LOB requiring external assist from therapist during ambulation. VSS with ambulation. Pt demonstrates some decreased R ankle dorsiflexion with gait.   Stairs  Wheelchair Mobility    Modified Rankin (Stroke Patients Only)       Balance Overall balance assessment: Needs assistance Sitting-balance support: No upper extremity supported Sitting balance-Leahy Scale: Fair Sitting balance - Comments: Pt leaning to the left in sitting but able to self correct    Standing balance support: Bilateral upper extremity supported Standing balance-Leahy Scale: Fair Standing balance comment: Pt able to balance with feet apart and no UE support. Requires UE support to place feet together. Increased sway once in position but does not fall. Positive Romberg. Unable to attempt single leg balance                             Pertinent Vitals/Pain Pain Assessment: No/denies pain    Home Living Family/patient expects to be discharged to:: Private residence Living Arrangements: Alone Available Help at Discharge: Family Type of Home: House Home Access: Stairs to enter Entrance Stairs-Rails: Right Entrance Stairs-Number of Steps: 4 Home Layout: One level Home Equipment: Walker - 2 wheels;Cane - single point;Bedside commode;Tub bench;Other (comment)(lift recliner)      Prior Function Level of Independence: Needs assistance   Gait / Transfers Assistance Needed: Modified independent with rolling walker. 2 falls in the last 12 months  ADL's / Homemaking Assistance Needed: Independent for ADLs, assist for IADLs. Drives limited distances.        Hand Dominance   Dominant Hand: Right    Extremity/Trunk Assessment   Upper Extremity Assessment Upper Extremity Assessment: RUE deficits/detail RUE Deficits / Details: 4-/5 R shoulder flexion and abduction weakess. 4-/5 R elbwo flexion weakness. Unable to compare grip strength due to L 4th digit injury but isolated grip strength on RUE appears grossly WFL. LUE strength grossly WFL    Lower Extremity Assessment Lower Extremity Assessment: RLE deficits/detail RLE Deficits / Details: 4-/5 R ankle dorsiflexion but otherwise strength is 4 to 4+/5 and symmetrical       Communication   Communication: HOH  Cognition Arousal/Alertness: Awake/alert Behavior During Therapy: WFL for tasks assessed/performed Overall Cognitive Status: Within Functional Limits for tasks assessed                                  General Comments: Pt struggles to get month and year. When asked about number for emergencies or rescue squad she repeats "919." Eventually with cues she is able to give "911" as the appropriate number      General Comments      Exercises     Assessment/Plan    PT Assessment Patient needs continued PT services  PT Problem List Decreased strength;Decreased balance;Decreased mobility;Decreased safety awareness       PT Treatment Interventions DME instruction;Gait training;Stair training;Functional mobility training;Therapeutic activities;Therapeutic exercise;Balance training;Neuromuscular re-education;Patient/family education    PT Goals (Current goals can be found in the Care Plan section)  Acute Rehab PT Goals Patient Stated Goal: Return to prior level of function at home PT Goal Formulation: With patient/family Time For Goal Achievement: 06/26/17 Potential to Achieve Goals: Good    Frequency 7X/week   Barriers to discharge Decreased caregiver support Lives alone    Co-evaluation               AM-PAC PT "6 Clicks" Daily Activity  Outcome Measure Difficulty turning over in bed (including adjusting bedclothes, sheets and blankets)?: A Little Difficulty moving from lying on back to sitting on the side  of the bed? : A Little Difficulty sitting down on and standing up from a chair with arms (e.g., wheelchair, bedside commode, etc,.)?: A Little Help needed moving to and from a bed to chair (including a wheelchair)?: A Little Help needed walking in hospital room?: A Little Help needed climbing 3-5 steps with a railing? : A Little 6 Click Score: 18    End of Session Equipment Utilized During Treatment: Gait belt Activity Tolerance: Patient tolerated treatment well Patient left: in bed;with call bell/phone within reach;with bed alarm set;with family/visitor present Nurse Communication: Mobility status PT Visit Diagnosis: Unsteadiness on feet  (R26.81);Repeated falls (R29.6);Muscle weakness (generalized) (M62.81);History of falling (Z91.81);Difficulty in walking, not elsewhere classified (R26.2)    Time: 0930-1001 PT Time Calculation (min) (ACUTE ONLY): 31 min   Charges:   PT Evaluation $PT Eval Low Complexity: 1 Low PT Treatments $Gait Training: 8-22 mins   PT G Codes:        Lyndel Safe Danita Proud PT, DPT    Ceclia Koker 06/12/2017, 10:51 AM

## 2017-06-12 NOTE — Care Management Obs Status (Signed)
[place] NOTIFICATION   Patient Details  Name: Caroline Aguilar MRN: [id number] Date of Birth: Apr 11, 1926   Medicare Observation Status Notification Given:  Yes    Beverly Sessions, RN 06/12/2017, 3:43 PM

## 2017-06-12 NOTE — NC FL2 (Signed)
[place] LEVEL OF CARE SCREENING TOOL     IDENTIFICATION  Patient Name: Caroline Aguilar Birthdate: Jul 29, 1925 Sex: female Admission Date (Current Location): 06/11/2017  [place] and [place] Number:  Engineering geologist and Address:  [facility], [address] [place], [place] [address]      Provider Number: [id number]  Attending Physician Name and Address:  Dustin Flock, MD  Relative Name and Phone Number:       Current Level of Care: Hospital Recommended Level of Care: [place] Prior Approval Number:    Date Approved/Denied:   PASRR Number:   [id number]  Discharge Plan: SNF    Current Diagnoses: Patient Active Problem List   Diagnosis Date Noted  . Stroke ([place]) 06/11/2017  . HLD (hyperlipidemia) 06/11/2017  . GERD (gastroesophageal reflux disease) 06/11/2017  . (HFpEF) heart failure with preserved ejection fraction ([place]) 06/11/2017  . Thigh hematoma 09/02/2016  . Dyspnea 07/15/2015  . Chest pain at rest 07/15/2015  . Community acquired pneumonia 07/02/2015  . Hyponatremia 07/02/2015  . AF (paroxysmal atrial fibrillation) ([place]) 07/02/2015    Orientation RESPIRATION BLADDER Height & Weight     Self, Time, Situation, Place  Normal Continent Weight: 165 lb (74.8 kg) Height:  5\' 5"  (165.1 cm)  BEHAVIORAL SYMPTOMS/MOOD NEUROLOGICAL BOWEL NUTRITION STATUS      Continent Diet(See DC summary)  AMBULATORY STATUS COMMUNICATION OF NEEDS Skin   Limited Assist Verbally Normal                       Personal Care Assistance Level of Assistance  Bathing, Feeding, Dressing Bathing Assistance: Independent Feeding assistance: Independent Dressing Assistance: Independent     Functional Limitations Info  Sight, Hearing, Speech Sight Info: Adequate Hearing Info: Adequate Speech Info: Adequate    SPECIAL CARE FACTORS FREQUENCY  PT (By licensed PT), OT (By licensed OT)     PT Frequency: 5x OT  Frequency: 5x            Contractures Contractures Info: Not present    Additional Factors Info  Code Status, Allergies Code Status Info: Full Code Allergies Info: Penicillins, Azithromycin, Famciclovir           Current Medications (06/12/2017):  This is the current hospital active medication list Current Facility-Administered Medications  Medication Dose Route Frequency Provider Last Rate Last Dose  . acetaminophen (TYLENOL) tablet 650 mg  650 mg Oral Q4H PRN Lance Coon, MD       Or  . acetaminophen (TYLENOL) solution 650 mg  650 mg Per Tube Q4H PRN Lance Coon, MD       Or  . acetaminophen (TYLENOL) suppository 650 mg  650 mg Rectal Q4H PRN Lance Coon, MD      . amiodarone (PACERONE) tablet 100 mg  100 mg Oral BID Dustin Flock, MD   100 mg at 06/12/17 1004  . apixaban (ELIQUIS) tablet 2.5 mg  2.5 mg Oral BID Dustin Flock, MD   2.5 mg at 06/12/17 1004  . latanoprost (XALATAN) 0.005 % ophthalmic solution 1 drop  1 drop Both Eyes QHS Lance Coon, MD      . oxyCODONE (Oxy IR/ROXICODONE) immediate release tablet 2.5 mg  2.5 mg Oral Once Alfred Levins, [place], MD      . pantoprazole (PROTONIX) EC tablet 40 mg  40 mg Oral Daily Lance Coon, MD   40 mg at 06/12/17 1004  . sertraline (ZOLOFT) tablet 50 mg  50 mg Oral Daily  Lance Coon, MD   50 mg at 06/12/17 1005  . simvastatin (ZOCOR) tablet 20 mg  20 mg Oral QHS Lance Coon, MD      . timolol (TIMOPTIC) 0.5 % ophthalmic solution 1 drop  1 drop Both Eyes BID Dustin Flock, MD         Discharge Medications: Please see discharge summary for a list of discharge medications.  Relevant Imaging Results:  Relevant Lab Results:   Additional Information SSN: [id number]  [place], [place]

## 2017-06-12 NOTE — Progress Notes (Signed)
*  PRELIMINARY RESULTS* Echocardiogram 2D Echocardiogram has been performed.  Caroline Aguilar 06/12/2017, 2:29 PM

## 2017-06-12 NOTE — Care Management (Signed)
Patient admitted from home for r/o stroke.  small acute infarct noted on MRI.  Patient lives at home alone.  States that her daughter in law lives locally for support.  PCP Emily Filbert.  Has elevated toilet seat, cane, RW, WC, and lift chair in the home.  Previously has been opened with [place] for [facility]. PT has assessed patient and recommends SNF.  Patient agreeable and being worked up by [employer].  RNCM signing off.  Please consult if indicated

## 2017-06-12 NOTE — Progress Notes (Signed)
SLP Cancellation Note  Patient Details Name: [name] MRN: [id number] DOB: 16-Feb-1926   Cancelled treatment:       Reason Eval/Treat Not Completed: SLP screened, no needs identified, will sign off(chart reviewed; consulted NSG then met w/ pt/family). Pt denied any difficulty swallowing and is currently on a regular diet; tolerates swallowing pills w/ water per NSG. Pt conversed at conversational level w/out overt deficits noted; pt and family denied any speech-language deficits at this time.  No further skilled ST services indicated as pt appears at her baseline. Pt agreed. NSG to reconsult if any change in status.    Orinda Kenner, MS, CCC-SLP [place] 06/12/2017, 9:07 AM

## 2017-06-13 DIAGNOSIS — E785 Hyperlipidemia, unspecified: Secondary | ICD-10-CM | POA: Diagnosis not present

## 2017-06-13 DIAGNOSIS — I639 Cerebral infarction, unspecified: Secondary | ICD-10-CM | POA: Diagnosis not present

## 2017-06-13 DIAGNOSIS — I5030 Unspecified diastolic (congestive) heart failure: Secondary | ICD-10-CM | POA: Diagnosis not present

## 2017-06-13 DIAGNOSIS — I4891 Unspecified atrial fibrillation: Secondary | ICD-10-CM | POA: Diagnosis not present

## 2017-06-13 LAB — ECHOCARDIOGRAM COMPLETE
Height: 65 in
WEIGHTICAEL: 2640 [oz_av]

## 2017-06-13 LAB — HEMOGLOBIN A1C
HEMOGLOBIN A1C: 5.6 % (ref 4.8–5.6)
Mean Plasma Glucose: 114 mg/dL

## 2017-06-13 MED ORDER — ATORVASTATIN CALCIUM 40 MG PO TABS: 40.0000 mg | ORAL_TABLET | Freq: Every day | ORAL | 1 refills | Status: DC

## 2017-06-13 NOTE — Progress Notes (Signed)
PT is now recommending home health. Clinical Social Worker (CSW) met with patient and made her aware of above. Patient reported that she is ready to go home today and can arrange a ride. RN case manager aware of above. Please reconsult if future social work needs arise. CSW signing off.   [employer], LCSW [phone]

## 2017-06-13 NOTE — Discharge Summary (Signed)
[facility] [place] NAME: Caroline Aguilar    MR#:  [id number]  DATE OF BIRTH:  [birth year]  DATE OF ADMISSION:  06/11/2017 ADMITTING PHYSICIAN: Lance Coon, MD  DATE OF DISCHARGE: 06/13/2017  PRIMARY CARE PHYSICIAN: [name], MD    ADMISSION DIAGNOSIS:  Neck pain [M54.2] Bilateral leg numbness [R20.0] Bilateral leg weakness [R29.898] Fall, initial encounter [W19.XXXA] Traumatic injury of head, initial encounter [S09.90XA] Cerebrovascular accident (CVA), unspecified mechanism ([place]) [I63.9]  DISCHARGE DIAGNOSIS:  Principal Problem:   Stroke [facility]) Active Problems:   AF (paroxysmal atrial fibrillation) (HCC)   HLD (hyperlipidemia)   GERD (gastroesophageal reflux disease)   (HFpEF) heart failure with preserved ejection fraction ([place])   SECONDARY DIAGNOSIS:   Past Medical History:  Diagnosis Date  . A-fib ([place])   . Angina pectoris ([place])   . Cancer [facility])    Colon cancer  . Hyperlipidemia     HOSPITAL COURSE:   84 year old female with past medical history of atrial fibrillation, hyperlipidemia, glaucoma, GERD, depression, osteoarthritis who presented to the hospital right lower extremity weakness.  1. Acute CVA-this was the cause of patient's right lower extremity weakness. Patient was admitted to the hospital for a stroke workup. She's MRI was positive for a left-sided corona radiata CVA. -Patient was seen by neurology who recommended continuing the patient's Eliquis, PT/OT evaluation and also speech evaluation. -Patient's symptoms have significantly improved since admission. She was seen by physical therapy and recommended home health services which is being arranged for her prior to discharge. Patient will continue her Eliquis, her statin dose was changed to high-dose intensity statin prior to discharge.  2. Hyperlipidemia-patient was on simvastatin but changed to atorvastatin high-dose given her acute CVA.  3. Hx of  paroxysmal atrial fibrillation-patient remained rate controlled and will cont. Amiodarone. She will continue Eliquis for long-term anticoagulation.   4. GERD - cont. Protonix.   5. Glaucoma - pt. Will cont. Her Xalatan and Timolol eye drops.   DISCHARGE CONDITIONS:   Stable.   CONSULTS OBTAINED:  Treatment Team:  Alexis Goodell, MD  DRUG ALLERGIES:   Allergies  Allergen Reactions  . Penicillins Anaphylaxis and Other (See Comments)    Has patient had a PCN reaction causing immediate rash, facial/tongue/throat swelling, SOB or lightheadedness with hypotension: Yes Has patient had a PCN reaction causing severe rash involving mucus membranes or skin necrosis: No Has patient had a PCN reaction that required hospitalization No Has patient had a PCN reaction occurring within the last 10 years: No If all of the above answers are "NO", then may proceed with Cephalosporin use.  . Azithromycin Other (See Comments)    Reaction:  Unknown   . Famciclovir Other (See Comments)    Reaction:  Unknown     DISCHARGE MEDICATIONS:   Allergies as of 06/13/2017      Reactions   Penicillins Anaphylaxis, Other (See Comments)   Has patient had a PCN reaction causing immediate rash, facial/tongue/throat swelling, SOB or lightheadedness with hypotension: Yes Has patient had a PCN reaction causing severe rash involving mucus membranes or skin necrosis: No Has patient had a PCN reaction that required hospitalization No Has patient had a PCN reaction occurring within the last 10 years: No If all of the above answers are "NO", then may proceed with Cephalosporin use.   Azithromycin Other (See Comments)   Reaction:  Unknown    Famciclovir Other (See Comments)   Reaction:  Unknown  Medication List    STOP taking these medications   simvastatin 20 MG tablet Commonly known as:  ZOCOR     TAKE these medications   amiodarone 200 MG tablet Commonly known as:  PACERONE Take 1 tablet (200 mg  total) by mouth daily. What changed:    how much to take  when to take this   atorvastatin 40 MG tablet Commonly known as:  LIPITOR Take 1 tablet (40 mg total) by mouth daily at 6 PM.   CALCIUM 600+D 600-200 MG-UNIT Tabs Generic drug:  Calcium Carbonate-Vitamin D Take 1 tablet by mouth daily.   docusate sodium 100 MG capsule Commonly known as:  COLACE Take 100 mg by mouth daily. Reported on 08/05/2015   ELIQUIS 2.5 MG Tabs tablet Generic drug:  apixaban Take 2.5 mg by mouth 2 (two) times daily.   feeding supplement (ENSURE ENLIVE) Liqd Take 237 mLs by mouth 2 (two) times daily between meals.   latanoprost 0.005 % ophthalmic solution Commonly known as:  XALATAN Place 1 drop into both eyes at bedtime.   meloxicam 7.5 MG tablet Commonly known as:  MOBIC Take 7.5 mg by mouth daily as needed.   pantoprazole 40 MG tablet Commonly known as:  PROTONIX Take 40 mg by mouth daily as needed.   polyethylene glycol packet Commonly known as:  MIRALAX / GLYCOLAX Take 17 g by mouth daily as needed for mild constipation.   potassium chloride SA 20 MEQ tablet Commonly known as:  K-DUR,KLOR-CON Take 40 mEq by mouth daily.   sertraline 50 MG tablet Commonly known as:  ZOLOFT Take 50 mg by mouth daily.   timolol 0.5 % ophthalmic solution Commonly known as:  TIMOPTIC Place 1 drop into both eyes 2 (two) times daily.         DISCHARGE INSTRUCTIONS:   DIET:  Cardiac diet  DISCHARGE CONDITION:  Stable  ACTIVITY:  Activity as tolerated  OXYGEN:  Home Oxygen: No.   Oxygen Delivery: room air  DISCHARGE LOCATION:  home with home health PT, RN.   If you experience worsening of your admission symptoms, develop shortness of breath, life threatening emergency, suicidal or homicidal thoughts you must seek medical attention immediately by calling 911 or calling your MD immediately  if symptoms less severe.  You Must read complete instructions/literature along with all the  possible adverse reactions/side effects for all the Medicines you take and that have been prescribed to you. Take any new Medicines after you have completely understood and accpet all the possible adverse reactions/side effects.   Please note  You were cared for by a hospitalist during your hospital stay. If you have any questions about your discharge medications or the care you received while you were in the hospital after you are discharged, you can call the unit and asked to speak with the hospitalist on call if the hospitalist that took care of you is not available. Once you are discharged, your primary care physician will handle any further medical issues. Please note that NO REFILLS for any discharge medications will be authorized once you are discharged, as it is imperative that you return to your primary care physician (or establish a relationship with a primary care physician if you do not have one) for your aftercare needs so that they can reassess your need for medications and monitor your lab values.     Today   Feels much better. Right sided weakness improved.   VITAL SIGNS:  Blood pressure (!) 177/80, pulse Marland Kitchen)  57, temperature 97.9 F (36.6 C), temperature source Oral, resp. rate 18, height 5\' 5"  (1.651 m), weight 74.8 kg (165 lb), SpO2 95 %.  I/O:    Intake/Output Summary (Last 24 hours) at 06/13/2017 1316 Last data filed at 06/12/2017 1700 Gross per 24 hour  Intake 240 ml  Output -  Net 240 ml    PHYSICAL EXAMINATION:  GENERAL:  84 y.o.-year-old patient lying in the bed with no acute distress.  EYES: Pupils equal, round, reactive to light and accommodation. No scleral icterus. Extraocular muscles intact.  HEENT: Head atraumatic, normocephalic. Oropharynx and nasopharynx clear.  NECK:  Supple, no jugular venous distention. No thyroid enlargement, no tenderness.  LUNGS: Normal breath sounds bilaterally, no wheezing, rales,rhonchi. No use of accessory muscles of  respiration.  CARDIOVASCULAR: S1, S2 normal. No murmurs, rubs, or gallops.  ABDOMEN: Soft, non-tender, non-distended. Bowel sounds present. No organomegaly or mass.  EXTREMITIES: No pedal edema, cyanosis, or clubbing.  NEUROLOGIC: Cranial nerves II through XII are intact. No focal motor or sensory defecits b/l.  PSYCHIATRIC: [name].   SKIN: No obvious rash, lesion, or ulcer.   DATA REVIEW:   CBC Recent Labs  Lab 06/11/17 1400  WBC 3.8  HGB 13.9  HCT 41.9  PLT 150    Chemistries  Recent Labs  Lab 06/11/17 1400  NA 131*  K 3.8  CL 96*  CO2 26  GLUCOSE 133*  BUN 16  CREATININE 0.77  CALCIUM 8.8*  AST 22  ALT 12*  ALKPHOS 70  BILITOT 0.6    Cardiac Enzymes No results for input(s): TROPONINI in the last 168 hours.   RADIOLOGY:  Ct Head Wo Contrast  Result Date: 06/11/2017 CLINICAL DATA:  84 year old female post fall 4 days ago. Awoke this morning with right leg numbness and now with left leg numbness. Neck pain. Initial encounter. EXAM: CT HEAD WITHOUT CONTRAST CT CERVICAL SPINE WITHOUT CONTRAST TECHNIQUE: Multidetector CT imaging of the head and cervical spine was performed following the standard protocol without intravenous contrast. Multiplanar CT image reconstructions of the cervical spine were also generated. COMPARISON:  09/02/2016 CT head and cervical spine. FINDINGS: CT HEAD FINDINGS Brain: No intracranial hemorrhage or CT evidence of large acute infarct. Chronic microvascular changes. Atrophy No intracranial mass lesion noted on this unenhanced exam. Vascular: Vascular calcifications. Skull: No skull fracture. Sinuses/Orbits: No acute orbital abnormality. Opacification right frontal sinus and ethmoid sinus air cells. Mild mucosal thickening right maxillary sinus. Other: Mastoid air cells and middle ear cavities are clear. CT CERVICAL SPINE FINDINGS Alignment: Alignment similar to prior exam. Skull base and vertebrae: No cervical spine  fracture. Soft tissues and spinal canal: No abnormal prevertebral soft tissue swelling. Disc levels: Cervical spondylotic changes C3-4 thru C6-7 with mild spinal stenosis and slight cord flattening C3-4 and C5-6 level similar to prior exam. Upper chest: No worrisome lung apical lesion. Other: Carotid bifurcation calcifications. Slightly heterogeneous thyroid gland. IMPRESSION: CT HEAD No skull fracture or intracranial hemorrhage. Chronic microvascular changes. Atrophy. Opacification right frontal sinus and ethmoid sinus air cells. Mild mucosal thickening right maxillary sinus. CT CERVICAL SPINE Alignment similar to prior exam. No cervical spine fracture or abnormal prevertebral soft tissue swelling. Cervical spondylotic changes C3-4 thru C6-7 with mild spinal stenosis and slight cord flattening C3-4 and C5-6 level similar to prior exam. Electronically Signed   By: Genia Del M.D.   On: 06/11/2017 14:57   Ct Cervical Spine Wo Contrast  Result Date: 06/11/2017 CLINICAL  DATA:  84 year old female post fall 4 days ago. Awoke this morning with right leg numbness and now with left leg numbness. Neck pain. Initial encounter. EXAM: CT HEAD WITHOUT CONTRAST CT CERVICAL SPINE WITHOUT CONTRAST TECHNIQUE: Multidetector CT imaging of the head and cervical spine was performed following the standard protocol without intravenous contrast. Multiplanar CT image reconstructions of the cervical spine were also generated. COMPARISON:  09/02/2016 CT head and cervical spine. FINDINGS: CT HEAD FINDINGS Brain: No intracranial hemorrhage or CT evidence of large acute infarct. Chronic microvascular changes. Atrophy No intracranial mass lesion noted on this unenhanced exam. Vascular: Vascular calcifications. Skull: No skull fracture. Sinuses/Orbits: No acute orbital abnormality. Opacification right frontal sinus and ethmoid sinus air cells. Mild mucosal thickening right maxillary sinus. Other: Mastoid air cells and middle ear cavities  are clear. CT CERVICAL SPINE FINDINGS Alignment: Alignment similar to prior exam. Skull base and vertebrae: No cervical spine fracture. Soft tissues and spinal canal: No abnormal prevertebral soft tissue swelling. Disc levels: Cervical spondylotic changes C3-4 thru C6-7 with mild spinal stenosis and slight cord flattening C3-4 and C5-6 level similar to prior exam. Upper chest: No worrisome lung apical lesion. Other: Carotid bifurcation calcifications. Slightly heterogeneous thyroid gland. IMPRESSION: CT HEAD No skull fracture or intracranial hemorrhage. Chronic microvascular changes. Atrophy. Opacification right frontal sinus and ethmoid sinus air cells. Mild mucosal thickening right maxillary sinus. CT CERVICAL SPINE Alignment similar to prior exam. No cervical spine fracture or abnormal prevertebral soft tissue swelling. Cervical spondylotic changes C3-4 thru C6-7 with mild spinal stenosis and slight cord flattening C3-4 and C5-6 level similar to prior exam. Electronically Signed   By: Genia Del M.D.   On: 06/11/2017 14:57   Mr Brain Wo Contrast  Result Date: 06/11/2017 CLINICAL DATA:  Recent fall. Now with neck pain and bilateral lower extremity numbness. EXAM: MRI HEAD WITHOUT CONTRAST MRI CERVICAL SPINE WITHOUT CONTRAST TECHNIQUE: Multiplanar, multiecho pulse sequences of the brain and surrounding structures, and cervical spine, to include the craniocervical junction and cervicothoracic junction, were obtained without intravenous contrast. COMPARISON:  Head CT 06/11/2017 FINDINGS: MRI HEAD FINDINGS Brain: The midline structures are normal. Small focus of abnormal diffusion weighted signal in the left corona radiata. No mass lesion, hydrocephalus, dural abnormality or extra-axial collection. Multifocal white matter hyperintensity, most commonly due to chronic ischemic microangiopathy. No age-advanced or lobar predominant atrophy. 3-5 peripherally scattered chronic micro hemorrhagic foci. Vascular: Major  intracranial arterial and venous sinus flow voids are preserved. Skull: Normal marrow signal. Sinuses/Orbits: No fluid levels or advanced mucosal thickening. No mastoid or middle ear effusion. Normal orbits. MRI CERVICAL SPINE FINDINGS Alignment: Mild reversal of normal cervical lordosis. Grade 1 retrolisthesis at C5-6 and C6-7. Vertebrae: No fracture, evidence of discitis, or bone lesion. Cord: Normal signal and morphology. Posterior Fossa, vertebral arteries, paraspinal tissues: Visualized posterior fossa is normal. Vertebral artery flow voids are preserved. No prevertebral soft tissue swelling. Disc levels: C1-C2: Normal. C2-C3: Normal disc space and facets. No spinal canal or neuroforaminal stenosis. C3-C4: Mild left neural foraminal stenosis due to uncovertebral and facet hypertrophy. C4-C5: Left-greater-than-right facet hypertrophy with mild left uncovertebral hypertrophy resulting in mild-to-moderate left neural foraminal stenosis. C5-C6: Disc space narrowing with small disc osteophyte complex moderately narrowing both neural foramina. Mild central spinal canal stenosis. C6-C7: Small disc osteophyte complex with mild bilateral foraminal narrowing. Mild central spinal canal stenosis. C7-T1: Normal disc space and facets. No spinal canal or neuroforaminal stenosis. IMPRESSION: 1. Small focus of abnormal DWI signal in the left corona radiata  is consistent with an acute or early subacute infarct. No acute hemorrhage or mass effect. 2. Nonspecific 3-5 peripherally scattered foci of chronic microhemorrhage. 3. No high-grade spinal canal stenosis or cervical cord compression. 4. Multilevel mild spinal canal and mild-to-moderate neural foraminal stenosis. Electronically Signed   By: Ulyses Jarred M.D.   On: 06/11/2017 20:46   Mr Cervical Spine Wo Contrast  Result Date: 06/11/2017 CLINICAL DATA:  Recent fall. Now with neck pain and bilateral lower extremity numbness. EXAM: MRI HEAD WITHOUT CONTRAST MRI CERVICAL  SPINE WITHOUT CONTRAST TECHNIQUE: Multiplanar, multiecho pulse sequences of the brain and surrounding structures, and cervical spine, to include the craniocervical junction and cervicothoracic junction, were obtained without intravenous contrast. COMPARISON:  Head CT 06/11/2017 FINDINGS: MRI HEAD FINDINGS Brain: The midline structures are normal. Small focus of abnormal diffusion weighted signal in the left corona radiata. No mass lesion, hydrocephalus, dural abnormality or extra-axial collection. Multifocal white matter hyperintensity, most commonly due to chronic ischemic microangiopathy. No age-advanced or lobar predominant atrophy. 3-5 peripherally scattered chronic micro hemorrhagic foci. Vascular: Major intracranial arterial and venous sinus flow voids are preserved. Skull: Normal marrow signal. Sinuses/Orbits: No fluid levels or advanced mucosal thickening. No mastoid or middle ear effusion. Normal orbits. MRI CERVICAL SPINE FINDINGS Alignment: Mild reversal of normal cervical lordosis. Grade 1 retrolisthesis at C5-6 and C6-7. Vertebrae: No fracture, evidence of discitis, or bone lesion. Cord: Normal signal and morphology. Posterior Fossa, vertebral arteries, paraspinal tissues: Visualized posterior fossa is normal. Vertebral artery flow voids are preserved. No prevertebral soft tissue swelling. Disc levels: C1-C2: Normal. C2-C3: Normal disc space and facets. No spinal canal or neuroforaminal stenosis. C3-C4: Mild left neural foraminal stenosis due to uncovertebral and facet hypertrophy. C4-C5: Left-greater-than-right facet hypertrophy with mild left uncovertebral hypertrophy resulting in mild-to-moderate left neural foraminal stenosis. C5-C6: Disc space narrowing with small disc osteophyte complex moderately narrowing both neural foramina. Mild central spinal canal stenosis. C6-C7: Small disc osteophyte complex with mild bilateral foraminal narrowing. Mild central spinal canal stenosis. C7-T1: Normal disc  space and facets. No spinal canal or neuroforaminal stenosis. IMPRESSION: 1. Small focus of abnormal DWI signal in the left corona radiata is consistent with an acute or early subacute infarct. No acute hemorrhage or mass effect. 2. Nonspecific 3-5 peripherally scattered foci of chronic microhemorrhage. 3. No high-grade spinal canal stenosis or cervical cord compression. 4. Multilevel mild spinal canal and mild-to-moderate neural foraminal stenosis. Electronically Signed   By: Ulyses Jarred M.D.   On: 06/11/2017 20:46   US Carotid Bilateral (at [facility] Only)  Result Date: 06/12/2017 CLINICAL DATA:  Stroke EXAM: BILATERAL CAROTID DUPLEX ULTRASOUND TECHNIQUE: Pearline Cables scale imaging, color Doppler and duplex ultrasound were performed of bilateral carotid and vertebral arteries in the neck. COMPARISON:  None. FINDINGS: Criteria: Quantification of carotid stenosis is based on velocity parameters that correlate the residual internal carotid diameter with NASCET-based stenosis levels, using the diameter of the distal internal carotid lumen as the denominator for stenosis measurement. The following velocity measurements were obtained: RIGHT ICA:  131 cm/sec CCA:  956 cm/sec SYSTOLIC ICA/CCA RATIO:  1.0 DIASTOLIC ICA/CCA RATIO:  1.1 ECA:  86 cm/sec LEFT ICA:  65 cm/sec CCA:  95 cm/sec SYSTOLIC ICA/CCA RATIO:  0.7 DIASTOLIC ICA/CCA RATIO:  1.1 ECA:  72 cm/sec RIGHT CAROTID ARTERY: Moderate focal calcified plaque in the bulb. Low resistance internal carotid Doppler pattern. RIGHT VERTEBRAL ARTERY:  Antegrade. LEFT CAROTID ARTERY: Mild calcified plaque in the bulb. Low resistance internal carotid Doppler pattern. LEFT  VERTEBRAL ARTERY:  Antegrade. IMPRESSION: There is 50-69% stenosis in the right internal carotid artery. Less than 50% stenosis in the left internal carotid artery. Electronically Signed   By: Marybelle Killings M.D.   On: 06/12/2017 13:08      Management plans discussed with the patient, family and they are in  agreement.  CODE STATUS:     Code Status Orders  (From admission, onward)        Start     Ordered   06/12/17 0447  Full code  Continuous     06/12/17 0446      Advance Directive Documentation     Most Recent Value  Type of Advance Directive  Healthcare Power of Attorney  Pre-existing out of facility DNR order (yellow form or pink MOST form)  No data  "MOST" Form in Place?  No data      TOTAL TIME TAKING CARE OF THIS PATIENT: 40 minutes.    Henreitta Leber M.D on 06/13/2017 at 1:16 PM  Between 7am to 6pm - Pager - [phone]  After 6pm go to [email or web address] - Proofreader  [facility]  Office  [phone]  CC: Primary care physician; [name], MD

## 2017-06-13 NOTE — Plan of Care (Signed)
  Progressing Education: Knowledge of General Education information will improve 06/13/2017 1354 - Progressing by Rowe Robert, RN Health Behavior/Discharge Planning: Ability to manage health-related needs will improve 06/13/2017 1354 - Progressing by Rowe Robert, RN Clinical Measurements: Ability to maintain clinical measurements within normal limits will improve 06/13/2017 1354 - Progressing by Rowe Robert, RN Will remain free from infection 06/13/2017 1354 - Progressing by Rowe Robert, RN Diagnostic test results will improve 06/13/2017 1354 - Progressing by Rowe Robert, RN Respiratory complications will improve 06/13/2017 1354 - Progressing by Rowe Robert, RN Cardiovascular complication will be avoided 06/13/2017 1354 - Progressing by Rowe Robert, RN Activity: Risk for activity intolerance will decrease 06/13/2017 1354 - Progressing by Rowe Robert, RN Nutrition: Adequate nutrition will be maintained 06/13/2017 1354 - Progressing by Rowe Robert, RN Coping: Level of anxiety will decrease 06/13/2017 1354 - Progressing by Rowe Robert, RN Elimination: Will not experience complications related to bowel motility 06/13/2017 1354 - Progressing by Rowe Robert, RN Will not experience complications related to urinary retention 06/13/2017 1354 - Progressing by Rowe Robert, RN Pain Managment: General experience of comfort will improve 06/13/2017 1354 - Progressing by Rowe Robert, RN Safety: Ability to remain free from injury will improve 06/13/2017 1354 - Progressing by Rowe Robert, RN Skin Integrity: Risk for impaired skin integrity will decrease 06/13/2017 1354 - Progressing by Rowe Robert, RN

## 2017-06-13 NOTE — Progress Notes (Signed)
Physical Therapy Treatment Patient Details Name: Caroline Aguilar MRN: [id number] DOB: [birth year] Today's Date: 06/13/2017    History of Present Illness Caroline Aguilar  is a 84 y.o. female who presents with right leg weakness.  Patient states that she woke up from sleep and had the symptoms.  Patient is on Eliquis for paroxysmal A. fib.  She states that she missed 2 doses the day prior to initiation of symptoms.  She was evaluated for stroke and MRI showed L corona radiata CVA.  Hospitalist were called for admission.    PT Comments    Pt making excellent progress with therapy on this date. She is fully independent with bed mobility with HOB flat and no use of bed rails. Supervision only for transfers. She is able to complete 2 laps around RN station with therapist. HR and SaO2 within normal limits and pt denies DOE. No assist required to steer walker and no LOB. Safe use of rolling walker. Decreased toe to floor clearance during ambulation and mild crouched knee posturing. She is able to complete all seated and standing exercises as instructed. At this point pt no longer requires SNF placement at discharge. DC recommendation updated to home with intermittent support from family and [facility] PT. She has a long history of falls and is certainly at increased risk for future falls. Pt has good support from family and wears a life alert system. Called son's cell phone at request of patient but no answer. Left message notifying son of updated DC recommendations. Pt will benefit from PT services to address deficits in strength, balance, and mobility in order to return to full function at home.    Follow Up Recommendations  Home health PT;Supervision - Intermittent     Equipment Recommendations  None recommended by PT;Other (comment)(Continued use of her rolling walker)    Recommendations for Other Services OT consult     Precautions / Restrictions Precautions Precautions: Fall Restrictions Weight  Bearing Restrictions: No    Mobility  Bed Mobility Overal bed mobility: Independent             General bed mobility comments: HOB flat, no bed rails. Good speed and sequencing. Mild posterior lean once sitting but able to self-correct  Transfers Overall transfer level: Needs assistance Equipment used: Rolling walker (2 wheeled) Transfers: Sit to/from Stand Sit to Stand: Supervision         General transfer comment: Pt performs sit to stand transfers with safe hand placement. She is steady in standing with bilateral UE support on rolling walker.  Ambulation/Gait Ambulation/Gait assistance: Min guard Ambulation Distance (Feet): 350 Feet Assistive device: Rolling walker (2 wheeled)   Gait velocity: Decreased but functional for full household ambulation and limited community mobility   General Gait Details: Pt is able to complete 2 laps around RN station with therapy. HR and SaO2 within normal limits and pt denies DOE. No assist required to steer walker and no LOB. Safe use of rolling walker. Decreased toe to floor clearance during ambulation and mild crouched knee posturing.    Stairs            Wheelchair Mobility    Modified Rankin (Stroke Patients Only)       Balance Overall balance assessment: Needs assistance Sitting-balance support: No upper extremity supported Sitting balance-Leahy Scale: Fair Sitting balance - Comments: Pt leaning to the left in sitting but able to self correct   Standing balance support: Bilateral upper extremity supported Standing balance-Leahy Scale: Fair Standing balance  comment: Pt able to balance with feet apart and no UE support. Requires UE support to place feet together. Increased sway once in position but does not fall. Positive Romberg. Unable to attempt single leg balance                            Cognition Arousal/Alertness: Awake/alert Behavior During Therapy: WFL for tasks assessed/performed Overall  Cognitive Status: Within Functional Limits for tasks assessed                                 General Comments: Cognition appears improved from yesterday. Pt articulating her thoughts clearly. Able to clearly state that she would call "911" in case of emergency without hesitation      Exercises General Exercises - Lower Extremity Long Arc Quad: Both;10 reps Heel Slides: Both;10 reps Hip ABduction/ADduction: Both;10 reps Hip Flexion/Marching: Both;10 reps Heel Raises: Both;10 reps Other Exercises Other Exercises: Sit to stand from elevated bed without UE support and minA+1 from therapist for balance Other Exercises: Standing balance practice with feet apart and eyes open/closed without UE support    General Comments        Pertinent Vitals/Pain Pain Assessment: No/denies pain    Home Living                      Prior Function            PT Goals (current goals can now be found in the care plan section) Acute Rehab PT Goals Patient Stated Goal: Return to prior level of function at home PT Goal Formulation: With patient/family Time For Goal Achievement: 06/26/17 Potential to Achieve Goals: Good Progress towards PT goals: Progressing toward goals    Frequency    7X/week      PT Plan Discharge plan needs to be updated    Co-evaluation              AM-PAC PT "6 Clicks" Daily Activity  Outcome Measure  Difficulty turning over in bed (including adjusting bedclothes, sheets and blankets)?: None Difficulty moving from lying on back to sitting on the side of the bed? : None Difficulty sitting down on and standing up from a chair with arms (e.g., wheelchair, bedside commode, etc,.)?: A Little Help needed moving to and from a bed to chair (including a wheelchair)?: A Little Help needed walking in hospital room?: A Little Help needed climbing 3-5 steps with a railing? : A Little 6 Click Score: 20    End of Session Equipment Utilized During  Treatment: Gait belt Activity Tolerance: Patient tolerated treatment well Patient left: with call bell/phone within reach;in chair;with chair alarm set;Other (comment)(Pt agrees to call when ready to get back to bed) Nurse Communication: Other (comment)(MD and care manager notified of DC recommendations) PT Visit Diagnosis: Unsteadiness on feet (R26.81);Repeated falls (R29.6);Muscle weakness (generalized) (M62.81);History of falling (Z91.81);Difficulty in walking, not elsewhere classified (R26.2)     Time: 8527-7824 PT Time Calculation (min) (ACUTE ONLY): 26 min  Charges:  $Gait Training: 8-22 mins $Therapeutic Exercise: 8-22 mins                    G Codes:       Lyndel Safe Cris Gibby PT, DPT     Dona Klemann 06/13/2017, 1:32 PM

## 2017-06-13 NOTE — Care Management (Signed)
Physical therapy re-evaluation completed. Recommending home with home health/physical therapy. States she does have life Alert in the home and daughter-in-law Shenita Trego can stay with her in the home. Discussed agencies with Ms. Pagett at the bedside. [place], will update Floydene Flock, [facility] representative. Discharge to home today per Dr. Verdell Carmine. Family will transport Shelbie Ammons RN MSN CCM Care management [phone]

## 2017-06-13 NOTE — Progress Notes (Signed)
Per [facility] admissions coordinator at [facility] she has started [facility] authorization.   [employer], LCSW [phone]

## 2017-06-13 NOTE — Progress Notes (Signed)
Out via w/c for home at this time w/o c/o.

## 2017-06-13 NOTE — Progress Notes (Signed)
Pt for discharge home. Alert. Improved  With symptoms from stroke.  P.t. Saw and encouraged full use of walker at home.  Instructions discussed with pt and family. presc given and discussed and home meds discussed. Diet/ activity and f/u discussed. Verbalized understanding of plans.  Iv d/cd.

## 2017-06-14 DIAGNOSIS — Z7901 Long term (current) use of anticoagulants: Secondary | ICD-10-CM | POA: Diagnosis not present

## 2017-06-14 DIAGNOSIS — Z9181 History of falling: Secondary | ICD-10-CM | POA: Diagnosis not present

## 2017-06-14 DIAGNOSIS — H409 Unspecified glaucoma: Secondary | ICD-10-CM | POA: Diagnosis not present

## 2017-06-14 DIAGNOSIS — I480 Paroxysmal atrial fibrillation: Secondary | ICD-10-CM | POA: Diagnosis not present

## 2017-06-14 DIAGNOSIS — K219 Gastro-esophageal reflux disease without esophagitis: Secondary | ICD-10-CM | POA: Diagnosis not present

## 2017-06-14 DIAGNOSIS — I69341 Monoplegia of lower limb following cerebral infarction affecting right dominant side: Secondary | ICD-10-CM | POA: Diagnosis not present

## 2017-06-14 DIAGNOSIS — E785 Hyperlipidemia, unspecified: Secondary | ICD-10-CM | POA: Diagnosis not present

## 2017-06-14 DIAGNOSIS — I5032 Chronic diastolic (congestive) heart failure: Secondary | ICD-10-CM | POA: Diagnosis not present

## 2017-06-18 DIAGNOSIS — E785 Hyperlipidemia, unspecified: Secondary | ICD-10-CM | POA: Diagnosis not present

## 2017-06-18 DIAGNOSIS — H409 Unspecified glaucoma: Secondary | ICD-10-CM | POA: Diagnosis not present

## 2017-06-18 DIAGNOSIS — I480 Paroxysmal atrial fibrillation: Secondary | ICD-10-CM | POA: Diagnosis not present

## 2017-06-18 DIAGNOSIS — I69341 Monoplegia of lower limb following cerebral infarction affecting right dominant side: Secondary | ICD-10-CM | POA: Diagnosis not present

## 2017-06-18 DIAGNOSIS — Z7901 Long term (current) use of anticoagulants: Secondary | ICD-10-CM | POA: Diagnosis not present

## 2017-06-18 DIAGNOSIS — I5032 Chronic diastolic (congestive) heart failure: Secondary | ICD-10-CM | POA: Diagnosis not present

## 2017-06-18 DIAGNOSIS — K219 Gastro-esophageal reflux disease without esophagitis: Secondary | ICD-10-CM | POA: Diagnosis not present

## 2017-06-18 DIAGNOSIS — Z9181 History of falling: Secondary | ICD-10-CM | POA: Diagnosis not present

## 2017-06-19 DIAGNOSIS — I480 Paroxysmal atrial fibrillation: Secondary | ICD-10-CM | POA: Diagnosis not present

## 2017-06-19 DIAGNOSIS — Z7901 Long term (current) use of anticoagulants: Secondary | ICD-10-CM | POA: Diagnosis not present

## 2017-06-19 DIAGNOSIS — I69341 Monoplegia of lower limb following cerebral infarction affecting right dominant side: Secondary | ICD-10-CM | POA: Diagnosis not present

## 2017-06-19 DIAGNOSIS — I5032 Chronic diastolic (congestive) heart failure: Secondary | ICD-10-CM | POA: Diagnosis not present

## 2017-06-19 DIAGNOSIS — K219 Gastro-esophageal reflux disease without esophagitis: Secondary | ICD-10-CM | POA: Diagnosis not present

## 2017-06-19 DIAGNOSIS — H409 Unspecified glaucoma: Secondary | ICD-10-CM | POA: Diagnosis not present

## 2017-06-19 DIAGNOSIS — E785 Hyperlipidemia, unspecified: Secondary | ICD-10-CM | POA: Diagnosis not present

## 2017-06-19 DIAGNOSIS — Z9181 History of falling: Secondary | ICD-10-CM | POA: Diagnosis not present

## 2017-06-20 DIAGNOSIS — G620 Drug-induced polyneuropathy: Secondary | ICD-10-CM | POA: Diagnosis not present

## 2017-06-20 DIAGNOSIS — I480 Paroxysmal atrial fibrillation: Secondary | ICD-10-CM | POA: Diagnosis not present

## 2017-06-20 DIAGNOSIS — E538 Deficiency of other specified B group vitamins: Secondary | ICD-10-CM | POA: Diagnosis not present

## 2017-06-20 DIAGNOSIS — I5032 Chronic diastolic (congestive) heart failure: Secondary | ICD-10-CM | POA: Diagnosis not present

## 2017-06-20 DIAGNOSIS — E782 Mixed hyperlipidemia: Secondary | ICD-10-CM | POA: Diagnosis not present

## 2017-06-20 DIAGNOSIS — I639 Cerebral infarction, unspecified: Secondary | ICD-10-CM | POA: Diagnosis not present

## 2017-06-21 DIAGNOSIS — H409 Unspecified glaucoma: Secondary | ICD-10-CM | POA: Diagnosis not present

## 2017-06-21 DIAGNOSIS — I69341 Monoplegia of lower limb following cerebral infarction affecting right dominant side: Secondary | ICD-10-CM | POA: Diagnosis not present

## 2017-06-21 DIAGNOSIS — E785 Hyperlipidemia, unspecified: Secondary | ICD-10-CM | POA: Diagnosis not present

## 2017-06-21 DIAGNOSIS — K219 Gastro-esophageal reflux disease without esophagitis: Secondary | ICD-10-CM | POA: Diagnosis not present

## 2017-06-21 DIAGNOSIS — Z7901 Long term (current) use of anticoagulants: Secondary | ICD-10-CM | POA: Diagnosis not present

## 2017-06-21 DIAGNOSIS — I5032 Chronic diastolic (congestive) heart failure: Secondary | ICD-10-CM | POA: Diagnosis not present

## 2017-06-21 DIAGNOSIS — Z9181 History of falling: Secondary | ICD-10-CM | POA: Diagnosis not present

## 2017-06-21 DIAGNOSIS — I480 Paroxysmal atrial fibrillation: Secondary | ICD-10-CM | POA: Diagnosis not present

## 2017-06-22 DIAGNOSIS — Z9181 History of falling: Secondary | ICD-10-CM | POA: Diagnosis not present

## 2017-06-22 DIAGNOSIS — I5032 Chronic diastolic (congestive) heart failure: Secondary | ICD-10-CM | POA: Diagnosis not present

## 2017-06-22 DIAGNOSIS — K219 Gastro-esophageal reflux disease without esophagitis: Secondary | ICD-10-CM | POA: Diagnosis not present

## 2017-06-22 DIAGNOSIS — Z7901 Long term (current) use of anticoagulants: Secondary | ICD-10-CM | POA: Diagnosis not present

## 2017-06-22 DIAGNOSIS — I69341 Monoplegia of lower limb following cerebral infarction affecting right dominant side: Secondary | ICD-10-CM | POA: Diagnosis not present

## 2017-06-22 DIAGNOSIS — H409 Unspecified glaucoma: Secondary | ICD-10-CM | POA: Diagnosis not present

## 2017-06-22 DIAGNOSIS — E785 Hyperlipidemia, unspecified: Secondary | ICD-10-CM | POA: Diagnosis not present

## 2017-06-22 DIAGNOSIS — I480 Paroxysmal atrial fibrillation: Secondary | ICD-10-CM | POA: Diagnosis not present

## 2017-06-25 DIAGNOSIS — E785 Hyperlipidemia, unspecified: Secondary | ICD-10-CM | POA: Diagnosis not present

## 2017-06-25 DIAGNOSIS — Z9181 History of falling: Secondary | ICD-10-CM | POA: Diagnosis not present

## 2017-06-25 DIAGNOSIS — K219 Gastro-esophageal reflux disease without esophagitis: Secondary | ICD-10-CM | POA: Diagnosis not present

## 2017-06-25 DIAGNOSIS — H409 Unspecified glaucoma: Secondary | ICD-10-CM | POA: Diagnosis not present

## 2017-06-25 DIAGNOSIS — I69341 Monoplegia of lower limb following cerebral infarction affecting right dominant side: Secondary | ICD-10-CM | POA: Diagnosis not present

## 2017-06-25 DIAGNOSIS — I5032 Chronic diastolic (congestive) heart failure: Secondary | ICD-10-CM | POA: Diagnosis not present

## 2017-06-25 DIAGNOSIS — I480 Paroxysmal atrial fibrillation: Secondary | ICD-10-CM | POA: Diagnosis not present

## 2017-06-25 DIAGNOSIS — Z7901 Long term (current) use of anticoagulants: Secondary | ICD-10-CM | POA: Diagnosis not present

## 2017-06-27 DIAGNOSIS — I5032 Chronic diastolic (congestive) heart failure: Secondary | ICD-10-CM | POA: Diagnosis not present

## 2017-06-27 DIAGNOSIS — I69341 Monoplegia of lower limb following cerebral infarction affecting right dominant side: Secondary | ICD-10-CM | POA: Diagnosis not present

## 2017-06-27 DIAGNOSIS — I480 Paroxysmal atrial fibrillation: Secondary | ICD-10-CM | POA: Diagnosis not present

## 2017-06-27 DIAGNOSIS — E785 Hyperlipidemia, unspecified: Secondary | ICD-10-CM | POA: Diagnosis not present

## 2017-06-28 DIAGNOSIS — K219 Gastro-esophageal reflux disease without esophagitis: Secondary | ICD-10-CM | POA: Diagnosis not present

## 2017-06-28 DIAGNOSIS — I480 Paroxysmal atrial fibrillation: Secondary | ICD-10-CM | POA: Diagnosis not present

## 2017-06-28 DIAGNOSIS — I69341 Monoplegia of lower limb following cerebral infarction affecting right dominant side: Secondary | ICD-10-CM | POA: Diagnosis not present

## 2017-06-28 DIAGNOSIS — Z9181 History of falling: Secondary | ICD-10-CM | POA: Diagnosis not present

## 2017-06-28 DIAGNOSIS — Z7901 Long term (current) use of anticoagulants: Secondary | ICD-10-CM | POA: Diagnosis not present

## 2017-06-28 DIAGNOSIS — H409 Unspecified glaucoma: Secondary | ICD-10-CM | POA: Diagnosis not present

## 2017-06-28 DIAGNOSIS — E785 Hyperlipidemia, unspecified: Secondary | ICD-10-CM | POA: Diagnosis not present

## 2017-06-28 DIAGNOSIS — I5032 Chronic diastolic (congestive) heart failure: Secondary | ICD-10-CM | POA: Diagnosis not present

## 2017-06-29 DIAGNOSIS — I69341 Monoplegia of lower limb following cerebral infarction affecting right dominant side: Secondary | ICD-10-CM | POA: Diagnosis not present

## 2017-06-29 DIAGNOSIS — E785 Hyperlipidemia, unspecified: Secondary | ICD-10-CM | POA: Diagnosis not present

## 2017-06-29 DIAGNOSIS — I5032 Chronic diastolic (congestive) heart failure: Secondary | ICD-10-CM | POA: Diagnosis not present

## 2017-06-29 DIAGNOSIS — K219 Gastro-esophageal reflux disease without esophagitis: Secondary | ICD-10-CM | POA: Diagnosis not present

## 2017-06-29 DIAGNOSIS — Z7901 Long term (current) use of anticoagulants: Secondary | ICD-10-CM | POA: Diagnosis not present

## 2017-06-29 DIAGNOSIS — I480 Paroxysmal atrial fibrillation: Secondary | ICD-10-CM | POA: Diagnosis not present

## 2017-06-29 DIAGNOSIS — Z9181 History of falling: Secondary | ICD-10-CM | POA: Diagnosis not present

## 2017-06-29 DIAGNOSIS — H409 Unspecified glaucoma: Secondary | ICD-10-CM | POA: Diagnosis not present

## 2017-07-02 DIAGNOSIS — I69341 Monoplegia of lower limb following cerebral infarction affecting right dominant side: Secondary | ICD-10-CM | POA: Diagnosis not present

## 2017-07-02 DIAGNOSIS — Z7901 Long term (current) use of anticoagulants: Secondary | ICD-10-CM | POA: Diagnosis not present

## 2017-07-02 DIAGNOSIS — I5032 Chronic diastolic (congestive) heart failure: Secondary | ICD-10-CM | POA: Diagnosis not present

## 2017-07-02 DIAGNOSIS — I480 Paroxysmal atrial fibrillation: Secondary | ICD-10-CM | POA: Diagnosis not present

## 2017-07-02 DIAGNOSIS — H409 Unspecified glaucoma: Secondary | ICD-10-CM | POA: Diagnosis not present

## 2017-07-02 DIAGNOSIS — Z9181 History of falling: Secondary | ICD-10-CM | POA: Diagnosis not present

## 2017-07-02 DIAGNOSIS — K219 Gastro-esophageal reflux disease without esophagitis: Secondary | ICD-10-CM | POA: Diagnosis not present

## 2017-07-02 DIAGNOSIS — E785 Hyperlipidemia, unspecified: Secondary | ICD-10-CM | POA: Diagnosis not present

## 2017-07-05 DIAGNOSIS — I10 Essential (primary) hypertension: Secondary | ICD-10-CM | POA: Diagnosis not present

## 2017-07-05 DIAGNOSIS — R51 Headache: Secondary | ICD-10-CM | POA: Diagnosis not present

## 2017-07-06 DIAGNOSIS — Z7901 Long term (current) use of anticoagulants: Secondary | ICD-10-CM | POA: Diagnosis not present

## 2017-07-06 DIAGNOSIS — I5032 Chronic diastolic (congestive) heart failure: Secondary | ICD-10-CM | POA: Diagnosis not present

## 2017-07-06 DIAGNOSIS — I69341 Monoplegia of lower limb following cerebral infarction affecting right dominant side: Secondary | ICD-10-CM | POA: Diagnosis not present

## 2017-07-06 DIAGNOSIS — I480 Paroxysmal atrial fibrillation: Secondary | ICD-10-CM | POA: Diagnosis not present

## 2017-07-06 DIAGNOSIS — E785 Hyperlipidemia, unspecified: Secondary | ICD-10-CM | POA: Diagnosis not present

## 2017-07-06 DIAGNOSIS — H409 Unspecified glaucoma: Secondary | ICD-10-CM | POA: Diagnosis not present

## 2017-07-06 DIAGNOSIS — K219 Gastro-esophageal reflux disease without esophagitis: Secondary | ICD-10-CM | POA: Diagnosis not present

## 2017-07-06 DIAGNOSIS — Z9181 History of falling: Secondary | ICD-10-CM | POA: Diagnosis not present

## 2017-07-10 DIAGNOSIS — Z7901 Long term (current) use of anticoagulants: Secondary | ICD-10-CM | POA: Diagnosis not present

## 2017-07-10 DIAGNOSIS — I69341 Monoplegia of lower limb following cerebral infarction affecting right dominant side: Secondary | ICD-10-CM | POA: Diagnosis not present

## 2017-07-10 DIAGNOSIS — I480 Paroxysmal atrial fibrillation: Secondary | ICD-10-CM | POA: Diagnosis not present

## 2017-07-10 DIAGNOSIS — K219 Gastro-esophageal reflux disease without esophagitis: Secondary | ICD-10-CM | POA: Diagnosis not present

## 2017-07-10 DIAGNOSIS — I5032 Chronic diastolic (congestive) heart failure: Secondary | ICD-10-CM | POA: Diagnosis not present

## 2017-07-10 DIAGNOSIS — Z9181 History of falling: Secondary | ICD-10-CM | POA: Diagnosis not present

## 2017-07-10 DIAGNOSIS — E785 Hyperlipidemia, unspecified: Secondary | ICD-10-CM | POA: Diagnosis not present

## 2017-07-10 DIAGNOSIS — I10 Essential (primary) hypertension: Secondary | ICD-10-CM | POA: Diagnosis not present

## 2017-07-10 DIAGNOSIS — H409 Unspecified glaucoma: Secondary | ICD-10-CM | POA: Diagnosis not present

## 2017-07-12 DIAGNOSIS — I69341 Monoplegia of lower limb following cerebral infarction affecting right dominant side: Secondary | ICD-10-CM | POA: Diagnosis not present

## 2017-07-12 DIAGNOSIS — I5032 Chronic diastolic (congestive) heart failure: Secondary | ICD-10-CM | POA: Diagnosis not present

## 2017-07-12 DIAGNOSIS — E785 Hyperlipidemia, unspecified: Secondary | ICD-10-CM | POA: Diagnosis not present

## 2017-07-12 DIAGNOSIS — Z7901 Long term (current) use of anticoagulants: Secondary | ICD-10-CM | POA: Diagnosis not present

## 2017-07-12 DIAGNOSIS — I480 Paroxysmal atrial fibrillation: Secondary | ICD-10-CM | POA: Diagnosis not present

## 2017-07-12 DIAGNOSIS — K219 Gastro-esophageal reflux disease without esophagitis: Secondary | ICD-10-CM | POA: Diagnosis not present

## 2017-07-12 DIAGNOSIS — Z9181 History of falling: Secondary | ICD-10-CM | POA: Diagnosis not present

## 2017-07-12 DIAGNOSIS — H409 Unspecified glaucoma: Secondary | ICD-10-CM | POA: Diagnosis not present

## 2017-07-13 DIAGNOSIS — H409 Unspecified glaucoma: Secondary | ICD-10-CM | POA: Diagnosis not present

## 2017-07-13 DIAGNOSIS — K219 Gastro-esophageal reflux disease without esophagitis: Secondary | ICD-10-CM | POA: Diagnosis not present

## 2017-07-13 DIAGNOSIS — Z7901 Long term (current) use of anticoagulants: Secondary | ICD-10-CM | POA: Diagnosis not present

## 2017-07-13 DIAGNOSIS — I5032 Chronic diastolic (congestive) heart failure: Secondary | ICD-10-CM | POA: Diagnosis not present

## 2017-07-13 DIAGNOSIS — I480 Paroxysmal atrial fibrillation: Secondary | ICD-10-CM | POA: Diagnosis not present

## 2017-07-13 DIAGNOSIS — I69341 Monoplegia of lower limb following cerebral infarction affecting right dominant side: Secondary | ICD-10-CM | POA: Diagnosis not present

## 2017-07-13 DIAGNOSIS — E785 Hyperlipidemia, unspecified: Secondary | ICD-10-CM | POA: Diagnosis not present

## 2017-07-13 DIAGNOSIS — Z9181 History of falling: Secondary | ICD-10-CM | POA: Diagnosis not present

## 2017-07-17 DIAGNOSIS — I480 Paroxysmal atrial fibrillation: Secondary | ICD-10-CM | POA: Diagnosis not present

## 2017-07-17 DIAGNOSIS — I5032 Chronic diastolic (congestive) heart failure: Secondary | ICD-10-CM | POA: Diagnosis not present

## 2017-07-17 DIAGNOSIS — I69341 Monoplegia of lower limb following cerebral infarction affecting right dominant side: Secondary | ICD-10-CM | POA: Diagnosis not present

## 2017-07-17 DIAGNOSIS — Z9181 History of falling: Secondary | ICD-10-CM | POA: Diagnosis not present

## 2017-07-17 DIAGNOSIS — Z7901 Long term (current) use of anticoagulants: Secondary | ICD-10-CM | POA: Diagnosis not present

## 2017-07-17 DIAGNOSIS — H409 Unspecified glaucoma: Secondary | ICD-10-CM | POA: Diagnosis not present

## 2017-07-17 DIAGNOSIS — K219 Gastro-esophageal reflux disease without esophagitis: Secondary | ICD-10-CM | POA: Diagnosis not present

## 2017-07-17 DIAGNOSIS — E785 Hyperlipidemia, unspecified: Secondary | ICD-10-CM | POA: Diagnosis not present

## 2017-07-19 DIAGNOSIS — Z7901 Long term (current) use of anticoagulants: Secondary | ICD-10-CM | POA: Diagnosis not present

## 2017-07-19 DIAGNOSIS — E785 Hyperlipidemia, unspecified: Secondary | ICD-10-CM | POA: Diagnosis not present

## 2017-07-19 DIAGNOSIS — I480 Paroxysmal atrial fibrillation: Secondary | ICD-10-CM | POA: Diagnosis not present

## 2017-07-19 DIAGNOSIS — I5032 Chronic diastolic (congestive) heart failure: Secondary | ICD-10-CM | POA: Diagnosis not present

## 2017-07-19 DIAGNOSIS — I69341 Monoplegia of lower limb following cerebral infarction affecting right dominant side: Secondary | ICD-10-CM | POA: Diagnosis not present

## 2017-07-19 DIAGNOSIS — H409 Unspecified glaucoma: Secondary | ICD-10-CM | POA: Diagnosis not present

## 2017-07-19 DIAGNOSIS — Z9181 History of falling: Secondary | ICD-10-CM | POA: Diagnosis not present

## 2017-07-19 DIAGNOSIS — K219 Gastro-esophageal reflux disease without esophagitis: Secondary | ICD-10-CM | POA: Diagnosis not present

## 2017-07-23 DIAGNOSIS — I69341 Monoplegia of lower limb following cerebral infarction affecting right dominant side: Secondary | ICD-10-CM | POA: Diagnosis not present

## 2017-07-23 DIAGNOSIS — E785 Hyperlipidemia, unspecified: Secondary | ICD-10-CM | POA: Diagnosis not present

## 2017-07-23 DIAGNOSIS — I480 Paroxysmal atrial fibrillation: Secondary | ICD-10-CM | POA: Diagnosis not present

## 2017-07-23 DIAGNOSIS — I5032 Chronic diastolic (congestive) heart failure: Secondary | ICD-10-CM | POA: Diagnosis not present

## 2017-07-23 DIAGNOSIS — H409 Unspecified glaucoma: Secondary | ICD-10-CM | POA: Diagnosis not present

## 2017-07-23 DIAGNOSIS — Z9181 History of falling: Secondary | ICD-10-CM | POA: Diagnosis not present

## 2017-07-23 DIAGNOSIS — K219 Gastro-esophageal reflux disease without esophagitis: Secondary | ICD-10-CM | POA: Diagnosis not present

## 2017-07-23 DIAGNOSIS — Z7901 Long term (current) use of anticoagulants: Secondary | ICD-10-CM | POA: Diagnosis not present

## 2017-07-24 DIAGNOSIS — M79641 Pain in right hand: Secondary | ICD-10-CM | POA: Diagnosis not present

## 2017-07-24 DIAGNOSIS — S6991XA Unspecified injury of right wrist, hand and finger(s), initial encounter: Secondary | ICD-10-CM | POA: Diagnosis not present

## 2017-07-24 DIAGNOSIS — I10 Essential (primary) hypertension: Secondary | ICD-10-CM | POA: Diagnosis not present

## 2017-07-25 DIAGNOSIS — I480 Paroxysmal atrial fibrillation: Secondary | ICD-10-CM | POA: Diagnosis not present

## 2017-07-25 DIAGNOSIS — Z7901 Long term (current) use of anticoagulants: Secondary | ICD-10-CM | POA: Diagnosis not present

## 2017-07-25 DIAGNOSIS — K219 Gastro-esophageal reflux disease without esophagitis: Secondary | ICD-10-CM | POA: Diagnosis not present

## 2017-07-25 DIAGNOSIS — I5032 Chronic diastolic (congestive) heart failure: Secondary | ICD-10-CM | POA: Diagnosis not present

## 2017-07-25 DIAGNOSIS — E785 Hyperlipidemia, unspecified: Secondary | ICD-10-CM | POA: Diagnosis not present

## 2017-07-25 DIAGNOSIS — Z9181 History of falling: Secondary | ICD-10-CM | POA: Diagnosis not present

## 2017-07-25 DIAGNOSIS — H409 Unspecified glaucoma: Secondary | ICD-10-CM | POA: Diagnosis not present

## 2017-07-25 DIAGNOSIS — I69341 Monoplegia of lower limb following cerebral infarction affecting right dominant side: Secondary | ICD-10-CM | POA: Diagnosis not present

## 2017-08-01 DIAGNOSIS — H409 Unspecified glaucoma: Secondary | ICD-10-CM | POA: Diagnosis not present

## 2017-08-01 DIAGNOSIS — Z9181 History of falling: Secondary | ICD-10-CM | POA: Diagnosis not present

## 2017-08-01 DIAGNOSIS — Z7901 Long term (current) use of anticoagulants: Secondary | ICD-10-CM | POA: Diagnosis not present

## 2017-08-01 DIAGNOSIS — I480 Paroxysmal atrial fibrillation: Secondary | ICD-10-CM | POA: Diagnosis not present

## 2017-08-01 DIAGNOSIS — I69341 Monoplegia of lower limb following cerebral infarction affecting right dominant side: Secondary | ICD-10-CM | POA: Diagnosis not present

## 2017-08-01 DIAGNOSIS — E785 Hyperlipidemia, unspecified: Secondary | ICD-10-CM | POA: Diagnosis not present

## 2017-08-01 DIAGNOSIS — I5032 Chronic diastolic (congestive) heart failure: Secondary | ICD-10-CM | POA: Diagnosis not present

## 2017-08-01 DIAGNOSIS — K219 Gastro-esophageal reflux disease without esophagitis: Secondary | ICD-10-CM | POA: Diagnosis not present

## 2017-08-03 DIAGNOSIS — I480 Paroxysmal atrial fibrillation: Secondary | ICD-10-CM | POA: Diagnosis not present

## 2017-08-03 DIAGNOSIS — I5032 Chronic diastolic (congestive) heart failure: Secondary | ICD-10-CM | POA: Diagnosis not present

## 2017-08-03 DIAGNOSIS — H409 Unspecified glaucoma: Secondary | ICD-10-CM | POA: Diagnosis not present

## 2017-08-03 DIAGNOSIS — Z9181 History of falling: Secondary | ICD-10-CM | POA: Diagnosis not present

## 2017-08-03 DIAGNOSIS — K219 Gastro-esophageal reflux disease without esophagitis: Secondary | ICD-10-CM | POA: Diagnosis not present

## 2017-08-03 DIAGNOSIS — E785 Hyperlipidemia, unspecified: Secondary | ICD-10-CM | POA: Diagnosis not present

## 2017-08-03 DIAGNOSIS — I69341 Monoplegia of lower limb following cerebral infarction affecting right dominant side: Secondary | ICD-10-CM | POA: Diagnosis not present

## 2017-08-03 DIAGNOSIS — Z7901 Long term (current) use of anticoagulants: Secondary | ICD-10-CM | POA: Diagnosis not present

## 2017-08-06 DIAGNOSIS — M6281 Muscle weakness (generalized): Secondary | ICD-10-CM | POA: Diagnosis not present

## 2017-08-07 DIAGNOSIS — H409 Unspecified glaucoma: Secondary | ICD-10-CM | POA: Diagnosis not present

## 2017-08-07 DIAGNOSIS — K219 Gastro-esophageal reflux disease without esophagitis: Secondary | ICD-10-CM | POA: Diagnosis not present

## 2017-08-07 DIAGNOSIS — I480 Paroxysmal atrial fibrillation: Secondary | ICD-10-CM | POA: Diagnosis not present

## 2017-08-07 DIAGNOSIS — I5032 Chronic diastolic (congestive) heart failure: Secondary | ICD-10-CM | POA: Diagnosis not present

## 2017-08-07 DIAGNOSIS — E785 Hyperlipidemia, unspecified: Secondary | ICD-10-CM | POA: Diagnosis not present

## 2017-08-07 DIAGNOSIS — Z7901 Long term (current) use of anticoagulants: Secondary | ICD-10-CM | POA: Diagnosis not present

## 2017-08-07 DIAGNOSIS — I69341 Monoplegia of lower limb following cerebral infarction affecting right dominant side: Secondary | ICD-10-CM | POA: Diagnosis not present

## 2017-08-07 DIAGNOSIS — Z9181 History of falling: Secondary | ICD-10-CM | POA: Diagnosis not present

## 2017-08-08 DIAGNOSIS — Z7901 Long term (current) use of anticoagulants: Secondary | ICD-10-CM | POA: Diagnosis not present

## 2017-08-08 DIAGNOSIS — Z9181 History of falling: Secondary | ICD-10-CM | POA: Diagnosis not present

## 2017-08-08 DIAGNOSIS — I5032 Chronic diastolic (congestive) heart failure: Secondary | ICD-10-CM | POA: Diagnosis not present

## 2017-08-08 DIAGNOSIS — H409 Unspecified glaucoma: Secondary | ICD-10-CM | POA: Diagnosis not present

## 2017-08-08 DIAGNOSIS — E785 Hyperlipidemia, unspecified: Secondary | ICD-10-CM | POA: Diagnosis not present

## 2017-08-08 DIAGNOSIS — I69341 Monoplegia of lower limb following cerebral infarction affecting right dominant side: Secondary | ICD-10-CM | POA: Diagnosis not present

## 2017-08-08 DIAGNOSIS — I480 Paroxysmal atrial fibrillation: Secondary | ICD-10-CM | POA: Diagnosis not present

## 2017-08-08 DIAGNOSIS — K219 Gastro-esophageal reflux disease without esophagitis: Secondary | ICD-10-CM | POA: Diagnosis not present

## 2017-08-10 DIAGNOSIS — E785 Hyperlipidemia, unspecified: Secondary | ICD-10-CM | POA: Diagnosis not present

## 2017-08-10 DIAGNOSIS — K219 Gastro-esophageal reflux disease without esophagitis: Secondary | ICD-10-CM | POA: Diagnosis not present

## 2017-08-10 DIAGNOSIS — H409 Unspecified glaucoma: Secondary | ICD-10-CM | POA: Diagnosis not present

## 2017-08-10 DIAGNOSIS — Z9181 History of falling: Secondary | ICD-10-CM | POA: Diagnosis not present

## 2017-08-10 DIAGNOSIS — I69341 Monoplegia of lower limb following cerebral infarction affecting right dominant side: Secondary | ICD-10-CM | POA: Diagnosis not present

## 2017-08-10 DIAGNOSIS — Z7901 Long term (current) use of anticoagulants: Secondary | ICD-10-CM | POA: Diagnosis not present

## 2017-08-10 DIAGNOSIS — I5032 Chronic diastolic (congestive) heart failure: Secondary | ICD-10-CM | POA: Diagnosis not present

## 2017-08-10 DIAGNOSIS — I480 Paroxysmal atrial fibrillation: Secondary | ICD-10-CM | POA: Diagnosis not present

## 2017-08-13 DIAGNOSIS — K219 Gastro-esophageal reflux disease without esophagitis: Secondary | ICD-10-CM | POA: Diagnosis not present

## 2017-08-13 DIAGNOSIS — H409 Unspecified glaucoma: Secondary | ICD-10-CM | POA: Diagnosis not present

## 2017-08-13 DIAGNOSIS — I10 Essential (primary) hypertension: Secondary | ICD-10-CM | POA: Diagnosis not present

## 2017-08-13 DIAGNOSIS — Z7901 Long term (current) use of anticoagulants: Secondary | ICD-10-CM | POA: Diagnosis not present

## 2017-08-13 DIAGNOSIS — I480 Paroxysmal atrial fibrillation: Secondary | ICD-10-CM | POA: Diagnosis not present

## 2017-08-13 DIAGNOSIS — E785 Hyperlipidemia, unspecified: Secondary | ICD-10-CM | POA: Diagnosis not present

## 2017-08-13 DIAGNOSIS — Z8673 Personal history of transient ischemic attack (TIA), and cerebral infarction without residual deficits: Secondary | ICD-10-CM | POA: Diagnosis not present

## 2017-08-13 DIAGNOSIS — I69341 Monoplegia of lower limb following cerebral infarction affecting right dominant side: Secondary | ICD-10-CM | POA: Diagnosis not present

## 2017-08-13 DIAGNOSIS — I5032 Chronic diastolic (congestive) heart failure: Secondary | ICD-10-CM | POA: Diagnosis not present

## 2017-08-13 DIAGNOSIS — Z9181 History of falling: Secondary | ICD-10-CM | POA: Diagnosis not present

## 2017-08-16 DIAGNOSIS — H409 Unspecified glaucoma: Secondary | ICD-10-CM | POA: Diagnosis not present

## 2017-08-16 DIAGNOSIS — E785 Hyperlipidemia, unspecified: Secondary | ICD-10-CM | POA: Diagnosis not present

## 2017-08-16 DIAGNOSIS — I5032 Chronic diastolic (congestive) heart failure: Secondary | ICD-10-CM | POA: Diagnosis not present

## 2017-08-16 DIAGNOSIS — K219 Gastro-esophageal reflux disease without esophagitis: Secondary | ICD-10-CM | POA: Diagnosis not present

## 2017-08-16 DIAGNOSIS — I480 Paroxysmal atrial fibrillation: Secondary | ICD-10-CM | POA: Diagnosis not present

## 2017-08-16 DIAGNOSIS — I69341 Monoplegia of lower limb following cerebral infarction affecting right dominant side: Secondary | ICD-10-CM | POA: Diagnosis not present

## 2017-08-16 DIAGNOSIS — Z7901 Long term (current) use of anticoagulants: Secondary | ICD-10-CM | POA: Diagnosis not present

## 2017-08-16 DIAGNOSIS — Z9181 History of falling: Secondary | ICD-10-CM | POA: Diagnosis not present

## 2017-08-21 DIAGNOSIS — H409 Unspecified glaucoma: Secondary | ICD-10-CM | POA: Diagnosis not present

## 2017-08-21 DIAGNOSIS — Z9181 History of falling: Secondary | ICD-10-CM | POA: Diagnosis not present

## 2017-08-21 DIAGNOSIS — Z7901 Long term (current) use of anticoagulants: Secondary | ICD-10-CM | POA: Diagnosis not present

## 2017-08-21 DIAGNOSIS — E785 Hyperlipidemia, unspecified: Secondary | ICD-10-CM | POA: Diagnosis not present

## 2017-08-21 DIAGNOSIS — I69341 Monoplegia of lower limb following cerebral infarction affecting right dominant side: Secondary | ICD-10-CM | POA: Diagnosis not present

## 2017-08-21 DIAGNOSIS — I480 Paroxysmal atrial fibrillation: Secondary | ICD-10-CM | POA: Diagnosis not present

## 2017-08-21 DIAGNOSIS — K219 Gastro-esophageal reflux disease without esophagitis: Secondary | ICD-10-CM | POA: Diagnosis not present

## 2017-08-21 DIAGNOSIS — I5032 Chronic diastolic (congestive) heart failure: Secondary | ICD-10-CM | POA: Diagnosis not present

## 2017-08-28 DIAGNOSIS — H409 Unspecified glaucoma: Secondary | ICD-10-CM | POA: Diagnosis not present

## 2017-08-28 DIAGNOSIS — I69341 Monoplegia of lower limb following cerebral infarction affecting right dominant side: Secondary | ICD-10-CM | POA: Diagnosis not present

## 2017-08-28 DIAGNOSIS — K219 Gastro-esophageal reflux disease without esophagitis: Secondary | ICD-10-CM | POA: Diagnosis not present

## 2017-08-28 DIAGNOSIS — I5032 Chronic diastolic (congestive) heart failure: Secondary | ICD-10-CM | POA: Diagnosis not present

## 2017-08-28 DIAGNOSIS — I480 Paroxysmal atrial fibrillation: Secondary | ICD-10-CM | POA: Diagnosis not present

## 2017-08-28 DIAGNOSIS — Z9181 History of falling: Secondary | ICD-10-CM | POA: Diagnosis not present

## 2017-08-28 DIAGNOSIS — E785 Hyperlipidemia, unspecified: Secondary | ICD-10-CM | POA: Diagnosis not present

## 2017-08-28 DIAGNOSIS — Z7901 Long term (current) use of anticoagulants: Secondary | ICD-10-CM | POA: Diagnosis not present

## 2017-09-05 DIAGNOSIS — H409 Unspecified glaucoma: Secondary | ICD-10-CM | POA: Diagnosis not present

## 2017-09-05 DIAGNOSIS — I480 Paroxysmal atrial fibrillation: Secondary | ICD-10-CM | POA: Diagnosis not present

## 2017-09-05 DIAGNOSIS — I5032 Chronic diastolic (congestive) heart failure: Secondary | ICD-10-CM | POA: Diagnosis not present

## 2017-09-05 DIAGNOSIS — K219 Gastro-esophageal reflux disease without esophagitis: Secondary | ICD-10-CM | POA: Diagnosis not present

## 2017-09-05 DIAGNOSIS — I69341 Monoplegia of lower limb following cerebral infarction affecting right dominant side: Secondary | ICD-10-CM | POA: Diagnosis not present

## 2017-09-11 DIAGNOSIS — H903 Sensorineural hearing loss, bilateral: Secondary | ICD-10-CM | POA: Diagnosis not present

## 2017-09-11 DIAGNOSIS — H6121 Impacted cerumen, right ear: Secondary | ICD-10-CM | POA: Diagnosis not present

## 2017-09-26 DIAGNOSIS — M1612 Unilateral primary osteoarthritis, left hip: Secondary | ICD-10-CM | POA: Diagnosis not present

## 2017-09-26 DIAGNOSIS — I10 Essential (primary) hypertension: Secondary | ICD-10-CM | POA: Diagnosis not present

## 2017-09-26 DIAGNOSIS — M79605 Pain in left leg: Secondary | ICD-10-CM | POA: Diagnosis not present

## 2017-09-26 DIAGNOSIS — M1712 Unilateral primary osteoarthritis, left knee: Secondary | ICD-10-CM | POA: Diagnosis not present

## 2017-09-26 DIAGNOSIS — R05 Cough: Secondary | ICD-10-CM | POA: Diagnosis not present

## 2017-09-26 DIAGNOSIS — M76892 Other specified enthesopathies of left lower limb, excluding foot: Secondary | ICD-10-CM | POA: Diagnosis not present

## 2017-09-27 DIAGNOSIS — Z7901 Long term (current) use of anticoagulants: Secondary | ICD-10-CM | POA: Diagnosis not present

## 2017-09-27 DIAGNOSIS — Z9181 History of falling: Secondary | ICD-10-CM | POA: Diagnosis not present

## 2017-09-27 DIAGNOSIS — H409 Unspecified glaucoma: Secondary | ICD-10-CM | POA: Diagnosis not present

## 2017-09-27 DIAGNOSIS — I5032 Chronic diastolic (congestive) heart failure: Secondary | ICD-10-CM | POA: Diagnosis not present

## 2017-09-27 DIAGNOSIS — K219 Gastro-esophageal reflux disease without esophagitis: Secondary | ICD-10-CM | POA: Diagnosis not present

## 2017-09-27 DIAGNOSIS — I480 Paroxysmal atrial fibrillation: Secondary | ICD-10-CM | POA: Diagnosis not present

## 2017-09-27 DIAGNOSIS — E785 Hyperlipidemia, unspecified: Secondary | ICD-10-CM | POA: Diagnosis not present

## 2017-09-27 DIAGNOSIS — I69341 Monoplegia of lower limb following cerebral infarction affecting right dominant side: Secondary | ICD-10-CM | POA: Diagnosis not present

## 2017-10-02 DIAGNOSIS — S81812A Laceration without foreign body, left lower leg, initial encounter: Secondary | ICD-10-CM | POA: Diagnosis not present

## 2017-10-02 DIAGNOSIS — S99922A Unspecified injury of left foot, initial encounter: Secondary | ICD-10-CM | POA: Diagnosis not present

## 2017-10-02 DIAGNOSIS — Y92009 Unspecified place in unspecified non-institutional (private) residence as the place of occurrence of the external cause: Secondary | ICD-10-CM | POA: Diagnosis not present

## 2017-10-02 DIAGNOSIS — W19XXXA Unspecified fall, initial encounter: Secondary | ICD-10-CM | POA: Diagnosis not present

## 2017-10-02 DIAGNOSIS — M79672 Pain in left foot: Secondary | ICD-10-CM | POA: Diagnosis not present

## 2017-10-11 DIAGNOSIS — I5032 Chronic diastolic (congestive) heart failure: Secondary | ICD-10-CM | POA: Diagnosis not present

## 2017-10-11 DIAGNOSIS — Z0000 Encounter for general adult medical examination without abnormal findings: Secondary | ICD-10-CM | POA: Diagnosis not present

## 2017-10-11 DIAGNOSIS — I480 Paroxysmal atrial fibrillation: Secondary | ICD-10-CM | POA: Diagnosis not present

## 2017-10-11 DIAGNOSIS — E538 Deficiency of other specified B group vitamins: Secondary | ICD-10-CM | POA: Diagnosis not present

## 2017-10-11 DIAGNOSIS — Z79899 Other long term (current) drug therapy: Secondary | ICD-10-CM | POA: Diagnosis not present

## 2018-02-05 DIAGNOSIS — E782 Mixed hyperlipidemia: Secondary | ICD-10-CM | POA: Diagnosis not present

## 2018-02-05 DIAGNOSIS — I639 Cerebral infarction, unspecified: Secondary | ICD-10-CM | POA: Diagnosis not present

## 2018-02-05 DIAGNOSIS — E538 Deficiency of other specified B group vitamins: Secondary | ICD-10-CM | POA: Diagnosis not present

## 2018-02-12 DIAGNOSIS — G620 Drug-induced polyneuropathy: Secondary | ICD-10-CM | POA: Diagnosis not present

## 2018-02-12 DIAGNOSIS — M79671 Pain in right foot: Secondary | ICD-10-CM | POA: Diagnosis not present

## 2018-02-12 DIAGNOSIS — E538 Deficiency of other specified B group vitamins: Secondary | ICD-10-CM | POA: Diagnosis not present

## 2018-02-12 DIAGNOSIS — Z23 Encounter for immunization: Secondary | ICD-10-CM | POA: Diagnosis not present

## 2018-02-12 DIAGNOSIS — S99921A Unspecified injury of right foot, initial encounter: Secondary | ICD-10-CM | POA: Diagnosis not present

## 2018-02-12 DIAGNOSIS — Z0000 Encounter for general adult medical examination without abnormal findings: Secondary | ICD-10-CM | POA: Diagnosis not present

## 2018-02-22 DIAGNOSIS — L97511 Non-pressure chronic ulcer of other part of right foot limited to breakdown of skin: Secondary | ICD-10-CM | POA: Diagnosis not present

## 2018-02-22 DIAGNOSIS — B351 Tinea unguium: Secondary | ICD-10-CM | POA: Diagnosis not present

## 2018-02-22 DIAGNOSIS — M2041 Other hammer toe(s) (acquired), right foot: Secondary | ICD-10-CM | POA: Diagnosis not present

## 2018-03-08 DIAGNOSIS — L97511 Non-pressure chronic ulcer of other part of right foot limited to breakdown of skin: Secondary | ICD-10-CM | POA: Diagnosis not present

## 2018-03-18 DIAGNOSIS — H401131 Primary open-angle glaucoma, bilateral, mild stage: Secondary | ICD-10-CM | POA: Diagnosis not present

## 2018-03-19 DIAGNOSIS — H401131 Primary open-angle glaucoma, bilateral, mild stage: Secondary | ICD-10-CM | POA: Diagnosis not present

## 2018-04-04 DIAGNOSIS — L97511 Non-pressure chronic ulcer of other part of right foot limited to breakdown of skin: Secondary | ICD-10-CM | POA: Diagnosis not present

## 2018-05-19 ENCOUNTER — Emergency Department
Admission: EM | Admit: 2018-05-19 | Discharge: 2018-05-19 | Disposition: A | Discharge status: Home / Self Care | Payer: Medicare HMO | Attending: Emergency Medicine | Admitting: Emergency Medicine

## 2018-05-19 ENCOUNTER — Encounter: Payer: Self-pay | Admitting: Emergency Medicine

## 2018-05-19 ENCOUNTER — Emergency Department: Payer: Medicare HMO

## 2018-05-19 ENCOUNTER — Other Ambulatory Visit: Payer: Self-pay

## 2018-05-19 DIAGNOSIS — S199XXA Unspecified injury of neck, initial encounter: Secondary | ICD-10-CM | POA: Diagnosis not present

## 2018-05-19 DIAGNOSIS — M542 Cervicalgia: Secondary | ICD-10-CM | POA: Diagnosis not present

## 2018-05-19 DIAGNOSIS — Z7901 Long term (current) use of anticoagulants: Secondary | ICD-10-CM | POA: Diagnosis not present

## 2018-05-19 DIAGNOSIS — Y9389 Activity, other specified: Secondary | ICD-10-CM | POA: Insufficient documentation

## 2018-05-19 DIAGNOSIS — Z79899 Other long term (current) drug therapy: Secondary | ICD-10-CM | POA: Diagnosis not present

## 2018-05-19 DIAGNOSIS — S0990XA Unspecified injury of head, initial encounter: Secondary | ICD-10-CM | POA: Diagnosis not present

## 2018-05-19 DIAGNOSIS — S0003XA Contusion of scalp, initial encounter: Secondary | ICD-10-CM | POA: Diagnosis not present

## 2018-05-19 DIAGNOSIS — W228XXA Striking against or struck by other objects, initial encounter: Secondary | ICD-10-CM | POA: Diagnosis not present

## 2018-05-19 DIAGNOSIS — S61512A Laceration without foreign body of left wrist, initial encounter: Secondary | ICD-10-CM | POA: Diagnosis not present

## 2018-05-19 DIAGNOSIS — Y929 Unspecified place or not applicable: Secondary | ICD-10-CM | POA: Insufficient documentation

## 2018-05-19 DIAGNOSIS — Y999 Unspecified external cause status: Secondary | ICD-10-CM | POA: Insufficient documentation

## 2018-05-19 NOTE — ED Provider Notes (Signed)
[facility] Emergency Department Provider Note ____________________________________________   First MD Initiated Contact with Patient 05/19/18 1316     (approximate)  I have reviewed the triage vital signs and the nursing notes.   HISTORY  Chief Complaint Fall    HPI Caroline Aguilar is a 85 y.o. female with PMH as noted below presents with head injury and left wrist injury after a mechanical fall from standing height.  The patient states that she was going to push open a heavy door when someone came the other way and pushed it up and causing her to fall backwards.  She denies LOC.  She has mild neck pain.  She denies severe headache or nausea.    Past Medical History:  Diagnosis Date  . A-fib ([place])   . Angina pectoris ([place])   . Cancer [facility])    Colon cancer  . Hyperlipidemia     Patient Active Problem List   Diagnosis Date Noted  . Stroke ([place]) 06/11/2017  . HLD (hyperlipidemia) 06/11/2017  . GERD (gastroesophageal reflux disease) 06/11/2017  . (HFpEF) heart failure with preserved ejection fraction ([place]) 06/11/2017  . Thigh hematoma 09/02/2016  . Dyspnea 07/15/2015  . Chest pain at rest 07/15/2015  . Community acquired pneumonia 07/02/2015  . Hyponatremia 07/02/2015  . AF (paroxysmal atrial fibrillation) ([place]) 07/02/2015    Past Surgical History:  Procedure Laterality Date  . CARDIAC CATHETERIZATION N/A 07/19/2015   Procedure: Right and Left Heart Cath;  Surgeon: Isaias Cowman, MD;  Location: [place] [facility];  Service: Cardiovascular;  Laterality: N/A;  . CATARACT EXTRACTION Bilateral   . COLON SURGERY    . ESOPHAGOGASTRODUODENOSCOPY N/A 07/08/2015   Procedure: ESOPHAGOGASTRODUODENOSCOPY (EGD);  Surgeon: Hulen Luster, MD;  Location: [facility] ENDOSCOPY;  Service: Endoscopy;  Laterality: N/A;  . HIP SURGERY Bilateral     Prior to Admission medications   Medication Sig Start Date End Date Taking? Authorizing Provider  amiodarone  (PACERONE) 200 MG tablet Take 1 tablet (200 mg total) by mouth daily. Patient taking differently: Take 100 mg by mouth 2 (two) times daily.  07/20/15   [name], MD  apixaban (ELIQUIS) 2.5 MG TABS tablet Take 2.5 mg by mouth 2 (two) times daily.     [provider]  atorvastatin (LIPITOR) 40 MG tablet Take 1 tablet (40 mg total) by mouth daily at 6 PM. 06/13/17 08/12/17  Henreitta Leber, MD  Calcium Carbonate-Vitamin D (CALCIUM 600+D) 600-200 MG-UNIT TABS Take 1 tablet by mouth daily.    [provider]  docusate sodium (COLACE) 100 MG capsule Take 100 mg by mouth daily. Reported on 08/05/2015    [provider]  feeding supplement, ENSURE ENLIVE, (ENSURE ENLIVE) LIQD Take 237 mLs by mouth 2 (two) times daily between meals. 07/09/15   Epifanio Lesches, MD  latanoprost (XALATAN) 0.005 % ophthalmic solution Place 1 drop into both eyes at bedtime.    [provider]  meloxicam (MOBIC) 7.5 MG tablet Take 7.5 mg by mouth daily as needed. 06/08/16   [provider]  pantoprazole (PROTONIX) 40 MG tablet Take 40 mg by mouth daily as needed.     [provider]  polyethylene glycol (MIRALAX / GLYCOLAX) packet Take 17 g by mouth daily as needed for mild constipation. 09/05/16   Gladstone Lighter, MD  potassium chloride SA (K-DUR,KLOR-CON) 20 MEQ tablet Take 40 mEq by mouth daily. 10/15/15   [provider]  sertraline (ZOLOFT) 50 MG tablet Take 50 mg by  mouth daily.    [provider]  timolol (TIMOPTIC) 0.5 % ophthalmic solution Place 1 drop into both eyes 2 (two) times daily.    [provider]    Allergies Penicillins; Azithromycin; and Famciclovir  Family History  Problem Relation Age of Onset  . Cancer Mother   . Heart failure Father     Social History Social History   Tobacco Use  . Smoking status: Never Smoker  . Smokeless tobacco: Never Used  Substance Use Topics  . Alcohol use: No  . Drug use: No     Review of Systems  Constitutional: No fever. Eyes: No visual changes. ENT: Positive for mild neck pain. Cardiovascular: Denies chest pain. Respiratory: Denies shortness of breath. Gastrointestinal: No nausea or vomiting. Genitourinary: Negative for flank pain.  Musculoskeletal: Negative for back pain. Skin: Negative for rash. Neurological: Negative for headaches, focal weakness or numbness.   ____________________________________________   PHYSICAL EXAM:  VITAL SIGNS: ED Triage Vitals  Enc Vitals Group     BP 05/19/18 1202 (!) 164/71     Pulse Rate 05/19/18 1202 64     Resp 05/19/18 1202 16     Temp 05/19/18 1202 98 F (36.7 C)     Temp Source 05/19/18 1202 Oral     SpO2 05/19/18 1202 100 %     Weight 05/19/18 1203 171 lb (77.6 kg)     Height 05/19/18 1203 5\' 5"  (1.651 m)     Head Circumference --      Peak Flow --      Pain Score 05/19/18 1203 3     Pain Loc --      Pain Edu? --      Excl. in [place]? --     Constitutional: Alert and oriented. Well appearing for age and in no acute distress. Eyes: Conjunctivae are normal.  EOMI.  PERRLA. Head: Parieto-occipital superficial abrasion with no laceration. Nose: No congestion/rhinnorhea. Mouth/Throat: Mucous membranes are moist.   Neck: Normal range of motion.  No midline cervical spinal tenderness. Cardiovascular:  Good peripheral circulation. Respiratory: Normal respiratory effort.  Gastrointestinal:  No distention.  Musculoskeletal: No lower extremity edema.  Extremities warm and well perfused.  Left wrist with 2 cm superficial laceration/skin tear laterally.  No bony tenderness.  Full range of motion. Neurologic:  Normal speech and language. No gross focal neurologic deficits are appreciated.  Skin:  Skin is warm and dry. No rash noted. Psychiatric: Mood and affect are normal. Speech and behavior are normal.  ____________________________________________   LABS (all labs ordered are listed, but only abnormal  results are displayed)  Labs Reviewed - No data to display ____________________________________________  EKG   ____________________________________________  RADIOLOGY  CT head: No ICH CT cervical spine no acute fracture  ____________________________________________   PROCEDURES  Procedure(s) performed: Yes  .Marland KitchenLaceration Repair Date/Time: 05/19/2018 3:47 PM Performed by: Arta Silence, MD Authorized by: Arta Silence, MD   Consent:    Consent obtained:  Verbal   Consent given by:  Patient   Risks discussed:  Infection, pain, retained foreign body, poor cosmetic result and poor wound healing Anesthesia (see MAR for exact dosages):    Anesthesia method:  Local infiltration   Local anesthetic:  Lidocaine 2% WITH epi Laceration details:    Location:  Shoulder/arm   Shoulder/arm location:  L lower arm   Length (cm):  2   Depth (mm):  2 Repair type:    Repair type:  Simple Exploration:    Hemostasis achieved  with:  Direct pressure   Wound exploration: entire depth of wound probed and visualized     Contaminated: no   Treatment:    Area cleansed with:  Saline   Amount of cleaning:  Extensive   Irrigation solution:  Sterile saline   Visualized foreign bodies/material removed: no   Skin repair:    Repair method:  Sutures   Suture size:  4-0   Suture material:  Nylon   Suture technique:  Simple interrupted   Number of sutures:  4 Approximation:    Approximation:  Close Post-procedure details:    Dressing:  Sterile dressing   Patient tolerance of procedure:  Tolerated well, no immediate complications    Critical Care performed: No ____________________________________________   INITIAL IMPRESSION / ASSESSMENT AND PLAN / ED COURSE  Pertinent labs & imaging results that were available during my care of the patient were reviewed by me and considered in my medical decision making (see chart for details).  85 year old female presents with head injury  after fall from standing height when someone opened the door from the other side.  She denies LOC.  In addition to an abrasion to her scalp, she has a small superficial laceration/skin tear to the left wrist with no bony tenderness.  CT head and cervical spine obtained from triage are negative.  Laceration was repaired without difficulty.  The patient has no neurologic findings on exam.  She is stable for discharge home.  Return precautions given including specific instructions related to the possibility of delayed bleeding on anticoagulation, and the patient expresses understanding. ____________________________________________   FINAL CLINICAL IMPRESSION(S) / ED DIAGNOSES  Final diagnoses:  Laceration of left wrist, initial encounter  Injury of head, initial encounter      NEW MEDICATIONS STARTED DURING THIS VISIT:  Discharge Medication List as of 05/19/2018  2:48 PM       Note:  This document was prepared using Dragon voice recognition software and may include unintentional dictation errors.    Arta Silence, MD 05/19/18 1550

## 2018-05-19 NOTE — Discharge Instructions (Addendum)
Return here or to your regular doctor in 1 week to have the stitches taken out of the wrist.  Return to the ER for new or worsening headache, weakness or lightheadedness, or any other new or worsening symptoms that concern you.

## 2018-05-19 NOTE — ED Triage Notes (Signed)
Pt to ED via POV, [place] and someone was coming out and pulled on the door and pt fell backwards hitting her head on the water fountain or wall. Pt does take blood thinners, pt has a hematoma on the back of her head with a small laceration. Bleeding is controlled at this time   Pt family reports that pt was slightly disoriented at fall. Pt is in NAD at this time. A & O x 4.

## 2018-05-29 DIAGNOSIS — I480 Paroxysmal atrial fibrillation: Secondary | ICD-10-CM | POA: Diagnosis not present

## 2018-05-29 DIAGNOSIS — G620 Drug-induced polyneuropathy: Secondary | ICD-10-CM | POA: Diagnosis not present

## 2018-05-29 DIAGNOSIS — S32010D Wedge compression fracture of first lumbar vertebra, subsequent encounter for fracture with routine healing: Secondary | ICD-10-CM | POA: Diagnosis not present

## 2018-05-29 DIAGNOSIS — S0003XD Contusion of scalp, subsequent encounter: Secondary | ICD-10-CM | POA: Diagnosis not present

## 2018-05-29 DIAGNOSIS — Z0000 Encounter for general adult medical examination without abnormal findings: Secondary | ICD-10-CM | POA: Diagnosis not present

## 2018-05-29 DIAGNOSIS — S3992XA Unspecified injury of lower back, initial encounter: Secondary | ICD-10-CM | POA: Diagnosis not present

## 2018-05-29 DIAGNOSIS — I5032 Chronic diastolic (congestive) heart failure: Secondary | ICD-10-CM | POA: Diagnosis not present

## 2018-05-29 DIAGNOSIS — M5116 Intervertebral disc disorders with radiculopathy, lumbar region: Secondary | ICD-10-CM | POA: Diagnosis not present

## 2018-05-29 DIAGNOSIS — W010XXA Fall on same level from slipping, tripping and stumbling without subsequent striking against object, initial encounter: Secondary | ICD-10-CM | POA: Diagnosis not present

## 2018-06-06 ENCOUNTER — Other Ambulatory Visit: Payer: Self-pay | Admitting: Internal Medicine

## 2018-06-06 DIAGNOSIS — S32040A Wedge compression fracture of fourth lumbar vertebra, initial encounter for closed fracture: Secondary | ICD-10-CM

## 2018-06-07 DIAGNOSIS — S32000A Wedge compression fracture of unspecified lumbar vertebra, initial encounter for closed fracture: Secondary | ICD-10-CM | POA: Diagnosis not present

## 2018-06-10 DIAGNOSIS — M4856XA Collapsed vertebra, not elsewhere classified, lumbar region, initial encounter for fracture: Secondary | ICD-10-CM | POA: Diagnosis not present

## 2018-06-10 DIAGNOSIS — M47816 Spondylosis without myelopathy or radiculopathy, lumbar region: Secondary | ICD-10-CM | POA: Diagnosis not present

## 2018-06-10 DIAGNOSIS — M48061 Spinal stenosis, lumbar region without neurogenic claudication: Secondary | ICD-10-CM | POA: Diagnosis not present

## 2018-06-10 DIAGNOSIS — M5136 Other intervertebral disc degeneration, lumbar region: Secondary | ICD-10-CM | POA: Diagnosis not present

## 2018-06-10 DIAGNOSIS — M4855XS Collapsed vertebra, not elsewhere classified, thoracolumbar region, sequela of fracture: Secondary | ICD-10-CM | POA: Diagnosis not present

## 2018-06-11 ENCOUNTER — Other Ambulatory Visit: Payer: Self-pay

## 2018-06-11 ENCOUNTER — Ambulatory Visit: Payer: Medicare HMO | Admitting: Anesthesiology

## 2018-06-11 ENCOUNTER — Encounter
Admission: RE | Disposition: A | Discharge status: Home / Self Care | Payer: Self-pay | Source: Home / Self Care | Attending: Orthopedic Surgery

## 2018-06-11 ENCOUNTER — Encounter: Payer: Self-pay | Admitting: *Deleted

## 2018-06-11 ENCOUNTER — Ambulatory Visit: Payer: Medicare HMO

## 2018-06-11 ENCOUNTER — Ambulatory Visit
Admission: RE | Admit: 2018-06-11 | Discharge: 2018-06-11 | Disposition: A | Discharge status: Home / Self Care | Payer: Medicare HMO | Attending: Orthopedic Surgery | Admitting: Orthopedic Surgery

## 2018-06-11 DIAGNOSIS — Z9221 Personal history of antineoplastic chemotherapy: Secondary | ICD-10-CM | POA: Insufficient documentation

## 2018-06-11 DIAGNOSIS — E785 Hyperlipidemia, unspecified: Secondary | ICD-10-CM | POA: Insufficient documentation

## 2018-06-11 DIAGNOSIS — S32049A Unspecified fracture of fourth lumbar vertebra, initial encounter for closed fracture: Secondary | ICD-10-CM | POA: Diagnosis not present

## 2018-06-11 DIAGNOSIS — Z8673 Personal history of transient ischemic attack (TIA), and cerebral infarction without residual deficits: Secondary | ICD-10-CM | POA: Diagnosis not present

## 2018-06-11 DIAGNOSIS — Z79899 Other long term (current) drug therapy: Secondary | ICD-10-CM | POA: Insufficient documentation

## 2018-06-11 DIAGNOSIS — Z85038 Personal history of other malignant neoplasm of large intestine: Secondary | ICD-10-CM | POA: Insufficient documentation

## 2018-06-11 DIAGNOSIS — H409 Unspecified glaucoma: Secondary | ICD-10-CM | POA: Diagnosis not present

## 2018-06-11 DIAGNOSIS — Z7901 Long term (current) use of anticoagulants: Secondary | ICD-10-CM | POA: Insufficient documentation

## 2018-06-11 DIAGNOSIS — I6521 Occlusion and stenosis of right carotid artery: Secondary | ICD-10-CM | POA: Diagnosis not present

## 2018-06-11 DIAGNOSIS — M48061 Spinal stenosis, lumbar region without neurogenic claudication: Secondary | ICD-10-CM | POA: Diagnosis not present

## 2018-06-11 DIAGNOSIS — S32040A Wedge compression fracture of fourth lumbar vertebra, initial encounter for closed fracture: Secondary | ICD-10-CM | POA: Diagnosis not present

## 2018-06-11 DIAGNOSIS — Y9222 Religious institution as the place of occurrence of the external cause: Secondary | ICD-10-CM | POA: Insufficient documentation

## 2018-06-11 DIAGNOSIS — W19XXXA Unspecified fall, initial encounter: Secondary | ICD-10-CM | POA: Insufficient documentation

## 2018-06-11 DIAGNOSIS — Y9389 Activity, other specified: Secondary | ICD-10-CM | POA: Insufficient documentation

## 2018-06-11 DIAGNOSIS — Z419 Encounter for procedure for purposes other than remedying health state, unspecified: Secondary | ICD-10-CM

## 2018-06-11 DIAGNOSIS — I4891 Unspecified atrial fibrillation: Secondary | ICD-10-CM | POA: Diagnosis not present

## 2018-06-11 DIAGNOSIS — K219 Gastro-esophageal reflux disease without esophagitis: Secondary | ICD-10-CM | POA: Diagnosis not present

## 2018-06-11 HISTORY — PX: KYPHOPLASTY: SHX5884

## 2018-06-11 LAB — BASIC METABOLIC PANEL
Anion gap: 8 (ref 5–15)
BUN: 17 mg/dL (ref 8–23)
CHLORIDE: 99 mmol/L (ref 98–111)
CO2: 30 mmol/L (ref 22–32)
CREATININE: 0.75 mg/dL (ref 0.44–1.00)
Calcium: 8.8 mg/dL — ABNORMAL LOW (ref 8.9–10.3)
GFR calc Af Amer: >60 mL/min (ref >60)
GFR calc non Af Amer: >60 mL/min (ref >60)
Glucose, Bld: 83 mg/dL (ref 70–99)
POTASSIUM: 3.8 mmol/L (ref 3.5–5.1)
Sodium: 137 mmol/L (ref 135–145)

## 2018-06-11 LAB — CBC
HCT: 42.2 % (ref 36.0–46.0)
Hemoglobin: 13.5 g/dL (ref 12.0–15.0)
MCH: 29 pg (ref 26.0–34.0)
MCHC: 32 g/dL (ref 30.0–36.0)
MCV: 90.8 fL (ref 80.0–100.0)
Platelets: 227 10*3/uL (ref 150–400)
RBC: 4.65 MIL/uL (ref 3.87–5.11)
RDW: 13 % (ref 11.5–15.5)
WBC: 5.4 10*3/uL (ref 4.0–10.5)
nRBC: 0 % (ref 0.0–0.2)

## 2018-06-11 SURGERY — KYPHOPLASTY
Anesthesia: Choice | Site: Back

## 2018-06-11 MED ORDER — FENTANYL CITRATE (PF) 100 MCG/2ML IJ SOLN
INTRAMUSCULAR | Status: DC | PRN
  Administered 2018-06-11 (×2): 25 ug via INTRAVENOUS

## 2018-06-11 MED ORDER — FAMOTIDINE 20 MG PO TABS: 20.0000 mg | ORAL_TABLET | Freq: Once | ORAL | Status: DC

## 2018-06-11 MED ORDER — CLINDAMYCIN PHOSPHATE 900 MG/50ML IV SOLN
INTRAVENOUS | Status: AC
  Filled 2018-06-11: qty 50

## 2018-06-11 MED ORDER — LIDOCAINE HCL 1 % IJ SOLN
INTRAMUSCULAR | Status: DC | PRN
  Administered 2018-06-11: 20 mL

## 2018-06-11 MED ORDER — LACTATED RINGERS IV SOLN
INTRAVENOUS | Status: DC
  Administered 2018-06-11: 15:00:00 via INTRAVENOUS

## 2018-06-11 MED ORDER — FENTANYL CITRATE (PF) 100 MCG/2ML IJ SOLN: 25.0000 ug | INTRAMUSCULAR | Status: DC | PRN

## 2018-06-11 MED ORDER — IOPAMIDOL (ISOVUE-M 200) INJECTION 41%
INTRAMUSCULAR | Status: DC | PRN
  Administered 2018-06-11: 20 mL

## 2018-06-11 MED ORDER — ONDANSETRON HCL 4 MG/2ML IJ SOLN: 4.0000 mg | Freq: Once | INTRAMUSCULAR | Status: DC | PRN

## 2018-06-11 MED ORDER — FENTANYL CITRATE (PF) 100 MCG/2ML IJ SOLN
INTRAMUSCULAR | Status: AC
  Filled 2018-06-11: qty 2

## 2018-06-11 MED ORDER — CLINDAMYCIN PHOSPHATE 900 MG/50ML IV SOLN
900.0000 mg | Freq: Once | INTRAVENOUS | Status: AC
  Administered 2018-06-11: 900 mg via INTRAVENOUS

## 2018-06-11 MED ORDER — PROPOFOL 500 MG/50ML IV EMUL
INTRAVENOUS | Status: DC | PRN
  Administered 2018-06-11: 60 ug/kg/min via INTRAVENOUS

## 2018-06-11 MED ORDER — PROPOFOL 10 MG/ML IV BOLUS
INTRAVENOUS | Status: DC | PRN
  Administered 2018-06-11: 20 ug via INTRAVENOUS
  Administered 2018-06-11: 30 ug via INTRAVENOUS

## 2018-06-11 MED ORDER — ONDANSETRON HCL 4 MG/2ML IJ SOLN
INTRAMUSCULAR | Status: AC
  Filled 2018-06-11: qty 2

## 2018-06-11 MED ORDER — PROPOFOL 500 MG/50ML IV EMUL
INTRAVENOUS | Status: AC
  Filled 2018-06-11: qty 50

## 2018-06-11 MED ORDER — BUPIVACAINE-EPINEPHRINE (PF) 0.5% -1:200000 IJ SOLN
INTRAMUSCULAR | Status: DC | PRN
  Administered 2018-06-11: 20 mL

## 2018-06-11 SURGICAL SUPPLY — 19 items
CEMENT KYPHON CX01A KIT/MIXER (Cement) ×2 IMPLANT
COVER WAND RF STERILE (DRAPES) ×2 IMPLANT
DERMABOND ADVANCED (GAUZE/BANDAGES/DRESSINGS) ×1
DERMABOND ADVANCED .7 DNX12 (GAUZE/BANDAGES/DRESSINGS) ×1 IMPLANT
DEVICE BIOPSY BONE KYPHX (INSTRUMENTS) ×2 IMPLANT
DRAPE C-ARM XRAY 36X54 (DRAPES) ×2 IMPLANT
DURAPREP 26ML APPLICATOR (WOUND CARE) ×2 IMPLANT
FEE RENTAL RFA GENERATOR (MISCELLANEOUS) IMPLANT
GLOVE SURG SYN 9.0  PF PI (GLOVE) ×1
GLOVE SURG SYN 9.0 PF PI (GLOVE) ×1 IMPLANT
GOWN SRG 2XL LVL 4 RGLN SLV (GOWNS) ×1 IMPLANT
GOWN STRL NON-REIN 2XL LVL4 (GOWNS) ×1
GOWN STRL REUS W/ TWL LRG LVL3 (GOWN DISPOSABLE) ×1 IMPLANT
GOWN STRL REUS W/TWL LRG LVL3 (GOWN DISPOSABLE) ×1
PACK KYPHOPLASTY (MISCELLANEOUS) ×2 IMPLANT
RENTAL RFA GENERATOR (MISCELLANEOUS) IMPLANT
STRAP SAFETY 5IN WIDE (MISCELLANEOUS) ×2 IMPLANT
TRAY KYPHOPAK 15/3 EXPRESS 1ST (MISCELLANEOUS) ×2 IMPLANT
TRAY KYPHOPAK 20/3 EXPRESS 1ST (MISCELLANEOUS) ×1 IMPLANT

## 2018-06-11 NOTE — Anesthesia Postprocedure Evaluation (Signed)
Anesthesia Post Note  Patient: Caroline Aguilar  Procedure(s) Performed: KYPHOPLASTY L4 (N/A Back)  Patient location during evaluation: PACU Anesthesia Type: General Level of consciousness: awake and alert Pain management: pain level controlled Vital Signs Assessment: post-procedure vital signs reviewed and stable Respiratory status: spontaneous breathing and respiratory function stable Cardiovascular status: stable Anesthetic complications: no     Last Vitals:  Vitals:   06/11/18 1617 06/11/18 1631  BP: (!) 156/73 (!) 159/57  Pulse: (!) 58 (!) 59  Resp: 14 18  Temp:  36.6 C  SpO2: 94% 100%    Last Pain:  Vitals:   06/11/18 1631  TempSrc: Oral  PainSc: 2                  KEPHART,WILLIAM K

## 2018-06-11 NOTE — Transfer of Care (Signed)
Immediate Anesthesia Transfer of Care Note  Patient: Caroline Aguilar  Procedure(s) Performed: KYPHOPLASTY L4 (N/A Back)  Patient Location: PACU  Anesthesia Type:MAC  Level of Consciousness: awake and drowsy  Airway & Oxygen Therapy: Patient Spontanous Breathing and Patient connected to nasal cannula oxygen  Post-op Assessment: Report given to RN  Post vital signs: Reviewed and stable  Last Vitals:  Vitals Value Taken Time  BP 140/60 06/11/2018  4:01 PM  Temp 36.4    Pulse 54 06/11/2018  4:03 PM  Resp 14 06/11/2018  4:03 PM  SpO2 100 % 06/11/2018  4:03 PM  Vitals shown include unvalidated device data.  Last Pain:  Vitals:   06/11/18 1453  TempSrc: Temporal  PainSc: 0-No pain         Complications: No apparent anesthesia complications

## 2018-06-11 NOTE — H&P (Signed)
Reviewed paper H+P, will be scanned into chart. No changes noted. MRI confirming L4 compression fracture only from [facility] yesterday reviewed.

## 2018-06-11 NOTE — Discharge Instructions (Addendum)
Take it easy today and tomorrow then resume more normal activities on Thursday.  Remove Band-Aid on Thursday then okay to shower.  AMBULATORY SURGERY  DISCHARGE INSTRUCTIONS   1) The drugs that you were given will stay in your system until tomorrow so for the next 24 hours you should not:  A) Drive an automobile B) Make any legal decisions C) Drink any alcoholic beverage   2) You may resume regular meals tomorrow.  Today it is better to start with liquids and gradually work up to solid foods.  You may eat anything you prefer, but it is better to start with liquids, then soup and crackers, and gradually work up to solid foods.   3) Please notify your doctor immediately if you have any unusual bleeding, trouble breathing, redness and pain at the surgery site, drainage, fever, or pain not relieved by medication.    4) Additional Instructions:        Please contact your physician with any problems or Same Day Surgery at [phone], Monday through Friday 6 am to 4 pm, or [place] [facility] number at [phone].

## 2018-06-11 NOTE — Anesthesia Post-op Follow-up Note (Signed)
Anesthesia QCDR form completed.        

## 2018-06-11 NOTE — Anesthesia Preprocedure Evaluation (Addendum)
Anesthesia Evaluation  Patient identified by MRN, date of birth, ID band Patient awake    Reviewed: Allergy & Precautions, NPO status , Patient's Chart, lab work & pertinent test results  History of Anesthesia Complications Negative for: history of anesthetic complications  Airway Mallampati: III       Dental   Pulmonary neg sleep apnea, neg COPD,           Cardiovascular (-) hypertension(-) Past MI and (-) CHF (-) dysrhythmias (-) Valvular Problems/Murmurs     Neuro/Psych neg Seizures CVA (R sided weakness), Residual Symptoms    GI/Hepatic Neg liver ROS, GERD  Medicated and Controlled,  Endo/Other  neg diabetes  Renal/GU negative Renal ROS     Musculoskeletal   Abdominal   Peds  Hematology   Anesthesia Other Findings   Reproductive/Obstetrics                            Anesthesia Physical Anesthesia Plan  ASA: III  Anesthesia Plan:    Post-op Pain Management:    Induction:   PONV Risk Score and Plan:   Airway Management Planned: Nasal Cannula  Additional Equipment:   Intra-op Plan:   Post-operative Plan:   Informed Consent: I have reviewed the patients History and Physical, chart, labs and discussed the procedure including the risks, benefits and alternatives for the proposed anesthesia with the patient or authorized representative who has indicated his/her understanding and acceptance.       Plan Discussed with:   Anesthesia Plan Comments:         Anesthesia Quick Evaluation

## 2018-06-11 NOTE — Op Note (Signed)
Date  06/11/2018  Time  4:01 pm   PATIENT: Caroline Aguilar   PRE-OPERATIVE DIAGNOSIS:  closed wedge compression fracture of L4   POST-OPERATIVE DIAGNOSIS:  closed wedge compression fracture of L4   PROCEDURE:  Procedure(s): KYPHOPLASTY L4  SURGEON: Laurene Footman, MD   ASSISTANTS: None   ANESTHESIA:   local and MAC   EBL:  No intake/output data recorded.   BLOOD ADMINISTERED:none   DRAINS: none    LOCAL MEDICATIONS USED:  MARCAINE    and XYLOCAINE    SPECIMEN:  L4 vertebral body biopsy   DISPOSITION OF SPECIMEN:  Pathology   COUNTS:  YES   TOURNIQUET:  * No tourniquets in log *   IMPLANTS: Bone cement   DICTATION: .Dragon Dictation  patient was brought to the operating room and after adequate anesthesia was obtained the patient was placed prone.  C arm was brought in in good visualization of the affected level obtained on both AP and lateral projections.  After patient identification and timeout procedures were completed, local anesthetic was infiltrated with 10 cc 1% Xylocaine infiltrated subcutaneously.  This is done the area on the right side of the planned approach.  The back was then prepped and draped in the usual sterile fashion sterile manner and repeat timeout procedure carried out.  A spinal needle was brought down to the pedicle on the right side of  L4 and a 50-50 mix of 1% Xylocaine half percent Sensorcaine with epinephrine total of 20 cc injected.  After allowing this to set a small incision was made and the trocar was advanced into the vertebral body in an extrapedicular fashion.  Biopsy was obtained Drilling was carried out balloon inserted with inflation to  for cc.  When the cement was appropriate consistency 6-1/2 cc were injected into the vertebral body without extravasation, good fill superior to inferior endplates and from right to left sides along the inferior endplate.  After the cement had set the trochar was removed and permanent C-arm views obtained.   The wound was closed with Dermabond followed by Band-Aid   PLAN OF CARE: Discharge to home after PACU   PATIENT DISPOSITION:  PACU - hemodynamically stable.

## 2018-06-12 ENCOUNTER — Encounter: Payer: Self-pay | Admitting: Orthopedic Surgery

## 2018-06-13 ENCOUNTER — Ambulatory Visit: Payer: Medicare HMO

## 2018-06-13 LAB — SURGICAL PATHOLOGY

## 2018-06-26 DIAGNOSIS — S32000D Wedge compression fracture of unspecified lumbar vertebra, subsequent encounter for fracture with routine healing: Secondary | ICD-10-CM | POA: Diagnosis not present

## 2018-11-25 DIAGNOSIS — H401132 Primary open-angle glaucoma, bilateral, moderate stage: Secondary | ICD-10-CM | POA: Diagnosis not present

## 2019-05-20 DIAGNOSIS — I5030 Unspecified diastolic (congestive) heart failure: Secondary | ICD-10-CM | POA: Diagnosis not present

## 2019-05-20 DIAGNOSIS — R197 Diarrhea, unspecified: Secondary | ICD-10-CM | POA: Diagnosis not present

## 2019-05-20 DIAGNOSIS — Z87891 Personal history of nicotine dependence: Secondary | ICD-10-CM | POA: Diagnosis not present

## 2019-05-20 DIAGNOSIS — Z23 Encounter for immunization: Secondary | ICD-10-CM | POA: Diagnosis not present

## 2019-05-20 DIAGNOSIS — E785 Hyperlipidemia, unspecified: Secondary | ICD-10-CM | POA: Diagnosis not present

## 2019-05-20 DIAGNOSIS — Z0000 Encounter for general adult medical examination without abnormal findings: Secondary | ICD-10-CM | POA: Diagnosis not present

## 2019-05-20 DIAGNOSIS — Z79899 Other long term (current) drug therapy: Secondary | ICD-10-CM | POA: Diagnosis not present

## 2019-05-20 DIAGNOSIS — Z7901 Long term (current) use of anticoagulants: Secondary | ICD-10-CM | POA: Diagnosis not present

## 2019-05-20 DIAGNOSIS — S32040D Wedge compression fracture of fourth lumbar vertebra, subsequent encounter for fracture with routine healing: Secondary | ICD-10-CM | POA: Diagnosis not present

## 2019-05-20 DIAGNOSIS — E782 Mixed hyperlipidemia: Secondary | ICD-10-CM | POA: Diagnosis not present

## 2019-05-20 DIAGNOSIS — E559 Vitamin D deficiency, unspecified: Secondary | ICD-10-CM | POA: Diagnosis not present

## 2019-05-27 DIAGNOSIS — H401131 Primary open-angle glaucoma, bilateral, mild stage: Secondary | ICD-10-CM | POA: Diagnosis not present

## 2019-06-02 DIAGNOSIS — H401132 Primary open-angle glaucoma, bilateral, moderate stage: Secondary | ICD-10-CM | POA: Diagnosis not present

## 2019-06-25 DIAGNOSIS — L97511 Non-pressure chronic ulcer of other part of right foot limited to breakdown of skin: Secondary | ICD-10-CM | POA: Diagnosis not present

## 2019-06-25 DIAGNOSIS — M2580 Other specified joint disorders, unspecified joint: Secondary | ICD-10-CM | POA: Diagnosis not present

## 2019-06-25 DIAGNOSIS — M2041 Other hammer toe(s) (acquired), right foot: Secondary | ICD-10-CM | POA: Diagnosis not present

## 2019-06-25 DIAGNOSIS — B351 Tinea unguium: Secondary | ICD-10-CM | POA: Diagnosis not present

## 2019-07-09 DIAGNOSIS — L97511 Non-pressure chronic ulcer of other part of right foot limited to breakdown of skin: Secondary | ICD-10-CM | POA: Diagnosis not present

## 2019-07-09 DIAGNOSIS — M2041 Other hammer toe(s) (acquired), right foot: Secondary | ICD-10-CM | POA: Diagnosis not present

## 2019-07-18 ENCOUNTER — Ambulatory Visit: Payer: Medicare HMO | Attending: Internal Medicine

## 2019-07-18 ENCOUNTER — Other Ambulatory Visit: Payer: Self-pay

## 2019-07-18 DIAGNOSIS — Z23 Encounter for immunization: Secondary | ICD-10-CM

## 2019-07-18 NOTE — Progress Notes (Signed)
   Covid-19 Vaccination Clinic  Name:  Caroline Aguilar    MRN: [id number] DOB: July 18, 1925  07/18/2019  [name] was observed post Covid-19 immunization for 30 minutes based on pre-vaccination screening without incident. She was provided with Vaccine Information Sheet and instruction to access the V-Safe system.   [name] was instructed to call 911 with any severe reactions post vaccine: Marland Kitchen Difficulty breathing  . Swelling of face and throat  . A fast heartbeat  . A bad rash all over body  . Dizziness and weakness   Immunizations Administered    Name Date Dose VIS Date Route   Pfizer COVID-19 Vaccine 07/18/2019 10:41 AM 0.3 mL 03/28/2019 Intramuscular   Manufacturer: [place]   Lot: [phone] [id number]   [place]: [id number]

## 2019-07-28 DIAGNOSIS — Z952 Presence of prosthetic heart valve: Secondary | ICD-10-CM | POA: Diagnosis not present

## 2019-07-28 DIAGNOSIS — Z87891 Personal history of nicotine dependence: Secondary | ICD-10-CM | POA: Diagnosis not present

## 2019-07-28 DIAGNOSIS — Z8639 Personal history of other endocrine, nutritional and metabolic disease: Secondary | ICD-10-CM | POA: Diagnosis not present

## 2019-07-28 DIAGNOSIS — Z79899 Other long term (current) drug therapy: Secondary | ICD-10-CM | POA: Diagnosis not present

## 2019-07-28 DIAGNOSIS — F419 Anxiety disorder, unspecified: Secondary | ICD-10-CM | POA: Diagnosis not present

## 2019-07-28 DIAGNOSIS — G500 Trigeminal neuralgia: Secondary | ICD-10-CM | POA: Diagnosis not present

## 2019-08-12 ENCOUNTER — Ambulatory Visit: Payer: Medicare HMO | Attending: Internal Medicine

## 2019-08-12 DIAGNOSIS — Z23 Encounter for immunization: Secondary | ICD-10-CM

## 2019-08-12 NOTE — Progress Notes (Signed)
   Covid-19 Vaccination Clinic  Name:  Caroline Aguilar    MRN: [id number] DOB: 1925-09-10  08/12/2019  [name] was observed post Covid-19 immunization for 15 minutes without incident. She was provided with Vaccine Information Sheet and instruction to access the V-Safe system.   [name] was instructed to call 911 with any severe reactions post vaccine: Marland Kitchen Difficulty breathing  . Swelling of face and throat  . A fast heartbeat  . A bad rash all over body  . Dizziness and weakness   Immunizations Administered    Name Date Dose VIS Date Route   Pfizer COVID-19 Vaccine 08/12/2019 12:22 PM 0.3 mL 06/11/2018 Intramuscular   Manufacturer: [place]   Lot: [id number]   [place]: [id number]

## 2019-09-15 ENCOUNTER — Emergency Department
Admission: EM | Admit: 2019-09-15 | Discharge: 2019-09-15 | Disposition: A | Discharge status: Home / Self Care | Payer: Medicare HMO | Attending: Emergency Medicine | Admitting: Emergency Medicine

## 2019-09-15 ENCOUNTER — Other Ambulatory Visit: Payer: Self-pay

## 2019-09-15 ENCOUNTER — Emergency Department: Payer: Medicare HMO

## 2019-09-15 DIAGNOSIS — Y999 Unspecified external cause status: Secondary | ICD-10-CM | POA: Insufficient documentation

## 2019-09-15 DIAGNOSIS — Y9389 Activity, other specified: Secondary | ICD-10-CM | POA: Insufficient documentation

## 2019-09-15 DIAGNOSIS — Z23 Encounter for immunization: Secondary | ICD-10-CM | POA: Insufficient documentation

## 2019-09-15 DIAGNOSIS — R001 Bradycardia, unspecified: Secondary | ICD-10-CM | POA: Diagnosis not present

## 2019-09-15 DIAGNOSIS — I10 Essential (primary) hypertension: Secondary | ICD-10-CM | POA: Diagnosis not present

## 2019-09-15 DIAGNOSIS — S8992XA Unspecified injury of left lower leg, initial encounter: Secondary | ICD-10-CM | POA: Diagnosis not present

## 2019-09-15 DIAGNOSIS — S3993XA Unspecified injury of pelvis, initial encounter: Secondary | ICD-10-CM | POA: Diagnosis not present

## 2019-09-15 DIAGNOSIS — Z7901 Long term (current) use of anticoagulants: Secondary | ICD-10-CM | POA: Insufficient documentation

## 2019-09-15 DIAGNOSIS — R52 Pain, unspecified: Secondary | ICD-10-CM | POA: Diagnosis not present

## 2019-09-15 DIAGNOSIS — Y929 Unspecified place or not applicable: Secondary | ICD-10-CM | POA: Diagnosis not present

## 2019-09-15 DIAGNOSIS — S81812A Laceration without foreign body, left lower leg, initial encounter: Secondary | ICD-10-CM | POA: Diagnosis not present

## 2019-09-15 DIAGNOSIS — Z79899 Other long term (current) drug therapy: Secondary | ICD-10-CM | POA: Diagnosis not present

## 2019-09-15 DIAGNOSIS — W0110XA Fall on same level from slipping, tripping and stumbling with subsequent striking against unspecified object, initial encounter: Secondary | ICD-10-CM | POA: Insufficient documentation

## 2019-09-15 DIAGNOSIS — W19XXXA Unspecified fall, initial encounter: Secondary | ICD-10-CM | POA: Diagnosis not present

## 2019-09-15 DIAGNOSIS — R58 Hemorrhage, not elsewhere classified: Secondary | ICD-10-CM | POA: Diagnosis not present

## 2019-09-15 LAB — URINALYSIS, COMPLETE (UACMP) WITH MICROSCOPIC
Bilirubin Urine: NEGATIVE
Glucose, UA: NEGATIVE mg/dL
Hgb urine dipstick: NEGATIVE
Ketones, ur: NEGATIVE mg/dL
Nitrite: NEGATIVE
Protein, ur: NEGATIVE mg/dL
Specific Gravity, Urine: 1.016 (ref 1.005–1.030)
pH: 6 (ref 5.0–8.0)

## 2019-09-15 MED ORDER — TRAMADOL HCL 50 MG PO TABS: 50.0000 mg | ORAL_TABLET | Freq: Four times a day (QID) | ORAL | 0 refills | Status: DC | PRN

## 2019-09-15 MED ORDER — TRAMADOL HCL 50 MG PO TABS
50.0000 mg | ORAL_TABLET | Freq: Once | ORAL | Status: AC
  Administered 2019-09-15: 50 mg via ORAL
  Filled 2019-09-15: qty 1

## 2019-09-15 MED ORDER — FOSFOMYCIN TROMETHAMINE 3 G PO PACK
3.0000 g | PACK | Freq: Once | ORAL | Status: AC
  Administered 2019-09-15: 3 g via ORAL
  Filled 2019-09-15: qty 3

## 2019-09-15 MED ORDER — TETANUS-DIPHTH-ACELL PERTUSSIS 5-2.5-18.5 LF-MCG/0.5 IM SUSP
0.5000 mL | Freq: Once | INTRAMUSCULAR | Status: AC
  Administered 2019-09-15: 0.5 mL via INTRAMUSCULAR
  Filled 2019-09-15: qty 0.5

## 2019-09-15 NOTE — Discharge Instructions (Addendum)
1.  You may take Tramadol as needed for pain. 2.  Keep wound clean and dry. 3.  Return to the ER for worsening symptoms, persistent vomiting, difficulty breathing or other concerns.

## 2019-09-15 NOTE — ED Notes (Signed)
Wound cleansed and dressed with nonstick dressing.

## 2019-09-15 NOTE — ED Provider Notes (Signed)
[facility] Emergency Department Provider Note   ____________________________________________   First MD Initiated Contact with Patient 09/15/19 [phone]     (approximate)  I have reviewed the triage vital signs and the nursing notes.   HISTORY  Chief Complaint Fall    HPI Caroline Aguilar is a 86 y.o. female brought to the ED from home via EMS with a chief complaint of mechanical fall.  Patient was standing up, leaning on her walker, pivoted and fell, scraping her left shin.  Patient takes Eliquis for atrial fibrillation.  Denies striking head or LOC.  Complains of left shin pain.  Also complaining of pelvis/left hip pain from a fall 1 week ago.  Denies headache, vision changes, neck pain, chest pain, shortness of breath, abdominal pain, nausea, vomiting or dizziness.  Tetanus is not up-to-date.       Past Medical History:  Diagnosis Date  . A-fib ([place])   . Angina pectoris ([place])   . Cancer [facility])    Colon cancer  . Hyperlipidemia     Patient Active Problem List   Diagnosis Date Noted  . Stroke ([place]) 06/11/2017  . HLD (hyperlipidemia) 06/11/2017  . GERD (gastroesophageal reflux disease) 06/11/2017  . (HFpEF) heart failure with preserved ejection fraction ([place]) 06/11/2017  . Thigh hematoma 09/02/2016  . Dyspnea 07/15/2015  . Chest pain at rest 07/15/2015  . Community acquired pneumonia 07/02/2015  . Hyponatremia 07/02/2015  . AF (paroxysmal atrial fibrillation) ([place]) 07/02/2015    Past Surgical History:  Procedure Laterality Date  . CARDIAC CATHETERIZATION N/A 07/19/2015   Procedure: Right and Left Heart Cath;  Surgeon: Isaias Cowman, MD;  Location: [facility] CV LAB;  Service: Cardiovascular;  Laterality: N/A;  . CATARACT EXTRACTION Bilateral   . COLON SURGERY    . ESOPHAGOGASTRODUODENOSCOPY N/A 07/08/2015   Procedure: ESOPHAGOGASTRODUODENOSCOPY (EGD);  Surgeon: Hulen Luster, MD;  Location: [facility] ENDOSCOPY;  Service: Endoscopy;  Laterality:  N/A;  . HIP SURGERY Bilateral   . KYPHOPLASTY N/A 06/11/2018   Procedure: KYPHOPLASTY L4;  Surgeon: Hessie Knows, MD;  Location: [facility] ORS;  Service: Orthopedics;  Laterality: N/A;    Prior to Admission medications   Medication Sig Start Date End Date Taking? Authorizing Provider  acetaminophen (TYLENOL) 500 MG tablet Take 500 mg by mouth every 4 (four) hours as needed for mild pain.    [provider]  amiodarone (PACERONE) 200 MG tablet Take 1 tablet (200 mg total) by mouth daily. 07/20/15   [name], MD  amLODipine (NORVASC) 5 MG tablet Take 5 mg by mouth daily. 05/16/18   [provider]  apixaban (ELIQUIS) 2.5 MG TABS tablet Take 2.5 mg by mouth 2 (two) times daily.     [provider]  Calcium Carbonate-Vitamin D (CALCIUM 600+D) 600-200 MG-UNIT TABS Take 1 tablet by mouth daily.    [provider]  docusate sodium (COLACE) 100 MG capsule Take 100 mg by mouth 2 (two) times daily. Reported on 08/05/2015    [provider]  latanoprost (XALATAN) 0.005 % ophthalmic solution Place 1 drop into both eyes at bedtime.    [provider]  pantoprazole (PROTONIX) 40 MG tablet Take 40 mg by mouth daily.     [provider]  potassium chloride SA (K-DUR,KLOR-CON) 20 MEQ tablet Take 20 mEq by mouth daily.  10/15/15   [provider]  sertraline (ZOLOFT) 50 MG tablet Take 50 mg by mouth daily.    [provider]  simvastatin (ZOCOR) 20  MG tablet Take 20 mg by mouth daily at 6 PM.  05/18/18   [provider]  timolol (TIMOPTIC) 0.5 % ophthalmic solution Place 1 drop into both eyes 2 (two) times daily.    [provider]  traMADol (ULTRAM) 50 MG tablet Take 1 tablet (50 mg total) by mouth every 6 (six) hours as needed. 09/15/19   Paulette Blanch, MD    Allergies Penicillins, Azithromycin, and Famciclovir  Family History  Problem Relation Age of Onset  . Cancer Mother   . Heart failure Father     Social  History Social History   Tobacco Use  . Smoking status: Never Smoker  . Smokeless tobacco: Never Used  Substance Use Topics  . Alcohol use: No  . Drug use: No    Review of Systems  Constitutional: Positive for fall.  No fever/chills Eyes: No visual changes. ENT: No sore throat. Cardiovascular: Denies chest pain. Respiratory: Denies shortness of breath. Gastrointestinal: No abdominal pain.  No nausea, no vomiting.  No diarrhea.  No constipation. Genitourinary: Negative for dysuria. Musculoskeletal: Positive for skin tear to left shin.  Positive for pelvis/left hip pain.  Negative for back pain. Skin: Negative for rash. Neurological: Negative for headaches, focal weakness or numbness.   ____________________________________________   PHYSICAL EXAM:  VITAL SIGNS: ED Triage Vitals  Enc Vitals Group     BP 09/15/19 0049 (!) 159/68     Pulse Rate 09/15/19 0049 (!) 59     Resp 09/15/19 0049 16     Temp 09/15/19 0049 98 F (36.7 C)     Temp Source 09/15/19 0049 Oral     SpO2 09/15/19 0049 96 %     Weight 09/15/19 0051 170 lb (77.1 kg)     Height 09/15/19 0051 5' (1.524 m)     Head Circumference --      Peak Flow --      Pain Score 09/15/19 0051 10     Pain Loc --      Pain Edu? --      Excl. in [place]? --     Constitutional: Alert and oriented.  Elderly appearing and in no acute distress. Eyes: Conjunctivae are normal. PERRL. EOMI. Head: Atraumatic. Nose: Atraumatic. Mouth/Throat: Mucous membranes are moist.  No dental malocclusion. Neck: No stridor.  No cervical spine tenderness to palpation. Cardiovascular: Normal rate, regular rhythm. Grossly normal heart sounds.  Good peripheral circulation. Respiratory: Normal respiratory effort.  No retractions. Lungs CTAB. Gastrointestinal: Soft and nontender. No distention. No abdominal bruits. No CVA tenderness. Musculoskeletal: No spinal tenderness to palpation.  Pelvis stable.  Left hip mildly tender to palpation but with full  range of motion.  Left leg is not shortened nor externally rotated.  Skin tear to lateral lower leg without active bleeding.  Palpable distal pulses.  Warm limb without evidence for ischemia.  Brisk, less than 5-second capillary refill.   Neurologic:  Normal speech and language. No gross focal neurologic deficits are appreciated.  Skin:  Skin is warm, dry and intact. No rash noted. Psychiatric: Mood and affect are normal. Speech and behavior are normal.  ____________________________________________   LABS (all labs ordered are listed, but only abnormal results are displayed)  Labs Reviewed  URINALYSIS, COMPLETE (UACMP) WITH MICROSCOPIC - Abnormal; Notable for the following components:      Result Value   Color, Urine YELLOW (*)    APPearance CLOUDY (*)    Leukocytes,Ua TRACE (*)    Bacteria, UA RARE (*)  All other components within normal limits   ____________________________________________  EKG  ED ECG REPORT I, Oryn Casanova J, the attending physician, personally viewed and interpreted this ECG.   Date: 09/15/2019  EKG Time: 0108  Rate: 56  Rhythm: normal EKG, normal sinus rhythm  Axis: Normal  Intervals:none  ST&T Change: Nonspecific  ____________________________________________  RADIOLOGY  ED MD interpretation: Negative radiographs of pelvis and left hip/fib  Official radiology report(s): DG Pelvis 1-2 Views  Result Date: 09/15/2019 CLINICAL DATA:  Fall EXAM: PELVIS - 1-2 VIEW COMPARISON:  09/02/2016 FINDINGS: There is no evidence of pelvic fracture or diastasis. No pelvic bone lesions are seen. Unchanged appearance of bilateral femoral hardware. L4 vertebroplasty. IMPRESSION: Negative. Electronically Signed   By: Ulyses Jarred M.D.   On: 09/15/2019 01:44   DG Tibia/Fibula Left  Result Date: 09/15/2019 CLINICAL DATA:  Fall EXAM: LEFT TIBIA AND FIBULA - 2 VIEW COMPARISON:  None. FINDINGS: There is no evidence of fracture or other focal bone lesions. Mild left knee  osteoarthrosis. Soft tissues are unremarkable. IMPRESSION: Negative. Electronically Signed   By: Ulyses Jarred M.D.   On: 09/15/2019 01:45    ____________________________________________   PROCEDURES  Procedure(s) performed (including Critical Care):  .1-3 Lead EKG Interpretation Performed by: Paulette Blanch, MD Authorized by: Paulette Blanch, MD     Interpretation: non-specific     ECG rate:  55   ECG rate assessment: bradycardic     Rhythm: sinus bradycardia     Ectopy: none     Conduction: normal   Comments:     Patient placed on cardiac monitor to evaluate for arrhythmias     ____________________________________________   INITIAL IMPRESSION / ASSESSMENT AND PLAN / ED COURSE  As part of my medical decision making, I reviewed the following data within the [place] notes reviewed and incorporated, Labs reviewed, EKG interpreted, Old chart reviewed, Radiograph reviewed and Notes from prior ED visits     Phiona P Pelzer was evaluated in Emergency Department on 09/15/2019 for the symptoms described in the history of present illness. She was evaluated in the context of the global COVID-19 pandemic, which necessitated consideration that the patient might be at risk for infection with the SARS-CoV-2 virus that causes COVID-19. Institutional protocols and algorithms that pertain to the evaluation of patients at risk for COVID-19 are in a state of rapid change based on information released by regulatory bodies including the CDC and federal and state organizations. These policies and algorithms were followed during the patient's care in the ED.    86 year old female presenting with left lower leg skin tear and pelvis/left hip pain status post mechanical fall.  Differential diagnosis includes but is not limited to fracture, dislocation, musculoskeletal injury, etc.  Will obtain x-ray imaging studies, EKG, UA and reassess.  Update tetanus.   Clinical Course as  of Sep 14 624  Mon Sep 15, 2019  0306 Updated patient and family member of imaging and UA results.  Fosfomycin for trace leukocytes and 6-10 WBC.  Tramadol for pain.  Will refill tramadol which patient is taking currently.  Strict return precautions given.  Both verbalized understanding and agree with plan of care.   [JS]    Clinical Course User Index [JS] Paulette Blanch, MD     ____________________________________________   FINAL CLINICAL IMPRESSION(S) / ED DIAGNOSES  Final diagnoses:  Fall, initial encounter  Noninfected skin tear of leg, left, initial encounter     ED Discharge Orders  Ordered    traMADol (ULTRAM) 50 MG tablet  Every 6 hours PRN     09/15/19 [id number]           Note:  This document was prepared using Dragon voice recognition software and may include unintentional dictation errors.   Paulette Blanch, MD 09/15/19 [phone]

## 2019-09-15 NOTE — ED Triage Notes (Signed)
Pt with fall from home, skin tear noted to left anterior tibial area. md at bedside.

## 2019-10-09 DIAGNOSIS — M2041 Other hammer toe(s) (acquired), right foot: Secondary | ICD-10-CM | POA: Diagnosis not present

## 2019-10-09 DIAGNOSIS — M898X9 Other specified disorders of bone, unspecified site: Secondary | ICD-10-CM | POA: Diagnosis not present

## 2019-10-21 DIAGNOSIS — M519 Unspecified thoracic, thoracolumbar and lumbosacral intervertebral disc disorder: Secondary | ICD-10-CM | POA: Diagnosis not present

## 2019-10-21 DIAGNOSIS — M1612 Unilateral primary osteoarthritis, left hip: Secondary | ICD-10-CM | POA: Diagnosis not present

## 2019-10-21 DIAGNOSIS — I708 Atherosclerosis of other arteries: Secondary | ICD-10-CM | POA: Diagnosis not present

## 2019-10-21 DIAGNOSIS — Z01818 Encounter for other preprocedural examination: Secondary | ICD-10-CM | POA: Diagnosis not present

## 2019-10-21 DIAGNOSIS — S72002A Fracture of unspecified part of neck of left femur, initial encounter for closed fracture: Secondary | ICD-10-CM | POA: Diagnosis not present

## 2019-10-21 DIAGNOSIS — S79912A Unspecified injury of left hip, initial encounter: Secondary | ICD-10-CM | POA: Diagnosis not present

## 2019-10-21 DIAGNOSIS — M25552 Pain in left hip: Secondary | ICD-10-CM | POA: Diagnosis not present

## 2019-10-21 DIAGNOSIS — I700 Atherosclerosis of aorta: Secondary | ICD-10-CM | POA: Diagnosis not present

## 2019-10-21 DIAGNOSIS — M545 Low back pain: Secondary | ICD-10-CM | POA: Diagnosis not present

## 2019-10-21 DIAGNOSIS — Z9181 History of falling: Secondary | ICD-10-CM | POA: Diagnosis not present

## 2019-10-21 DIAGNOSIS — I480 Paroxysmal atrial fibrillation: Secondary | ICD-10-CM | POA: Diagnosis not present

## 2019-10-21 DIAGNOSIS — Z8673 Personal history of transient ischemic attack (TIA), and cerebral infarction without residual deficits: Secondary | ICD-10-CM | POA: Diagnosis not present

## 2019-10-21 DIAGNOSIS — Z87891 Personal history of nicotine dependence: Secondary | ICD-10-CM | POA: Diagnosis not present

## 2019-10-29 ENCOUNTER — Other Ambulatory Visit: Payer: Self-pay | Admitting: Podiatry

## 2019-10-29 DIAGNOSIS — M898X9 Other specified disorders of bone, unspecified site: Secondary | ICD-10-CM | POA: Diagnosis not present

## 2019-10-29 DIAGNOSIS — M2041 Other hammer toe(s) (acquired), right foot: Secondary | ICD-10-CM | POA: Diagnosis not present

## 2019-10-30 ENCOUNTER — Other Ambulatory Visit: Payer: Self-pay | Admitting: Orthopedic Surgery

## 2019-10-30 DIAGNOSIS — M5136 Other intervertebral disc degeneration, lumbar region: Secondary | ICD-10-CM | POA: Diagnosis not present

## 2019-10-30 DIAGNOSIS — S32010A Wedge compression fracture of first lumbar vertebra, initial encounter for closed fracture: Secondary | ICD-10-CM

## 2019-10-30 DIAGNOSIS — S22080A Wedge compression fracture of T11-T12 vertebra, initial encounter for closed fracture: Secondary | ICD-10-CM | POA: Diagnosis not present

## 2019-10-30 DIAGNOSIS — S22070A Wedge compression fracture of T9-T10 vertebra, initial encounter for closed fracture: Secondary | ICD-10-CM | POA: Diagnosis not present

## 2019-11-04 ENCOUNTER — Ambulatory Visit
Admission: RE | Admit: 2019-11-04 | Discharge: 2019-11-04 | Disposition: A | Discharge status: Home / Self Care | Payer: Medicare HMO | Source: Ambulatory Visit | Attending: Orthopedic Surgery | Admitting: Orthopedic Surgery

## 2019-11-04 ENCOUNTER — Other Ambulatory Visit: Payer: Self-pay

## 2019-11-04 DIAGNOSIS — M5136 Other intervertebral disc degeneration, lumbar region: Secondary | ICD-10-CM | POA: Diagnosis not present

## 2019-11-04 DIAGNOSIS — S22080A Wedge compression fracture of T11-T12 vertebra, initial encounter for closed fracture: Secondary | ICD-10-CM | POA: Diagnosis not present

## 2019-11-04 DIAGNOSIS — S32010A Wedge compression fracture of first lumbar vertebra, initial encounter for closed fracture: Secondary | ICD-10-CM

## 2019-11-04 DIAGNOSIS — D1809 Hemangioma of other sites: Secondary | ICD-10-CM | POA: Diagnosis not present

## 2019-11-04 DIAGNOSIS — M4854XA Collapsed vertebra, not elsewhere classified, thoracic region, initial encounter for fracture: Secondary | ICD-10-CM | POA: Diagnosis not present

## 2019-11-04 DIAGNOSIS — M5125 Other intervertebral disc displacement, thoracolumbar region: Secondary | ICD-10-CM | POA: Diagnosis not present

## 2019-11-04 DIAGNOSIS — M47816 Spondylosis without myelopathy or radiculopathy, lumbar region: Secondary | ICD-10-CM | POA: Diagnosis not present

## 2019-11-04 DIAGNOSIS — M4856XA Collapsed vertebra, not elsewhere classified, lumbar region, initial encounter for fracture: Secondary | ICD-10-CM | POA: Diagnosis not present

## 2019-11-04 DIAGNOSIS — M48061 Spinal stenosis, lumbar region without neurogenic claudication: Secondary | ICD-10-CM | POA: Diagnosis not present

## 2019-11-04 DIAGNOSIS — M5126 Other intervertebral disc displacement, lumbar region: Secondary | ICD-10-CM | POA: Diagnosis not present

## 2019-11-04 DIAGNOSIS — M5134 Other intervertebral disc degeneration, thoracic region: Secondary | ICD-10-CM | POA: Diagnosis not present

## 2019-11-10 ENCOUNTER — Encounter
Admission: RE | Admit: 2019-11-10 | Discharge: 2019-11-10 | Disposition: A | Discharge status: Home / Self Care | Payer: Medicare HMO | Source: Ambulatory Visit | Attending: Podiatry | Admitting: Podiatry

## 2019-11-10 ENCOUNTER — Other Ambulatory Visit: Payer: Self-pay

## 2019-11-10 DIAGNOSIS — Z01812 Encounter for preprocedural laboratory examination: Secondary | ICD-10-CM | POA: Insufficient documentation

## 2019-11-10 HISTORY — DX: Gastro-esophageal reflux disease without esophagitis: K21.9

## 2019-11-10 HISTORY — DX: Repeated falls: R29.6

## 2019-11-10 HISTORY — DX: Polyneuropathy, unspecified: G62.9

## 2019-11-10 NOTE — Patient Instructions (Addendum)
Your procedure is scheduled on: 11-14-19 FRIDAY Report to Same Day Surgery 2nd floor medical mall [facility] Entrance-take elevator on left to 2nd floor.  Check in with surgery information desk.) To find out your arrival time please call [phone] between 1PM - 3PM on 11-13-19 THURSDAY  Remember: Instructions that are not followed completely may result in serious medical risk, up to and including death, or upon the discretion of your surgeon and anesthesiologist your surgery may need to be rescheduled.    _x___ 1. Do not eat food after midnight the night before your procedure. NO GUM OR CANDY AFTER MIDNIGHT. You may drink clear liquids up to 2 hours before you are scheduled to arrive at the hospital for your procedure.  Do not drink clear liquids within 2 hours of your scheduled arrival to the hospital.  Clear liquids include  --Water or Apple juice without pulp  --Gatorade  --Black Coffee or Clear Tea (No milk, no creamers, do not add anything to the coffee or Tea  _X___Ensure clear carbohydrate drink-FINISH DRINK 2 HOURS PRIOR TO ARRIVAL TIME TO HOSPITAL THE DAY OF YOUR SURGERY    __x__ 2. No Alcohol for 24 hours before or after surgery.   __x__3. No Smoking or e-cigarettes for 24 prior to surgery.  Do not use any chewable tobacco products for at least 6 hour prior to surgery   ____  4. Bring all medications with you on the day of surgery if instructed.    __x__ 5. Notify your doctor if there is any change in your medical condition     (cold, fever, infections).    x___6. On the morning of surgery brush your teeth with toothpaste and water.  You may rinse your mouth with mouth wash if you wish.  Do not swallow any toothpaste or mouthwash.   Do not wear jewelry, make-up, hairpins, clips or nail polish.  Do not wear lotions, powders, or perfumes.   Do not shave 48 hours prior to surgery. Men may shave face and neck.  Do not bring valuables to the hospital.    [facility] is not  responsible for any belongings or valuables.               Contacts, dentures or bridgework may not be worn into surgery.  Leave your suitcase in the car. After surgery it may be brought to your room.  For patients admitted to the hospital, discharge time is determined by your  treatment team.  _  Patients discharged the day of surgery will not be allowed to drive home.  You will need someone to drive you home and stay with you the night of your procedure.    Please read over the following fact sheets that you were given:   [facility] Preparing for Surgery/INCENTIVE SPIROMETER INSTRUCTIONS-THE INCENTIVE SPIROMETER DEVICE WILL BE GIVEN TO YOU THE DAY OF SURGERY  _x___ TAKE THE FOLLOWING MEDICATION THE MORNING OF SURGERY WITH A SMALL SIP OF WATER. These include:  1. AMIODARONE (PACERONE)  2. WELLBUTRIN (BUPROPION)  3. ZOLOFT (SERTRALINE)  4.  PRILOSEC (OMEPRAZOLE)  5.  6.  ____Fleets enema or Magnesium Citrate as directed.   _x___ Use CHG wipes as directed on instruction sheet   ____ Use inhalers on the day of surgery and bring to hospital day of surgery  ____ Stop Metformin and Janumet 2 days prior to surgery.    ____ Take 1/2 of usual insulin dose the night before surgery and none on the  morning surgery.   _x___ Follow recommendations from Cardiologist, Pulmonologist or PCP regarding stopping Aspirin, Coumadin, Plavix ,Eliquis, Effient, or Pradaxa, and Pletal-INSTRUCTED TO STOP ELIQUIS 2 DAYS PRIOR TO SURGERY-LAST DOSE ON 11-11-19 (Tuesday)  X____Stop Anti-inflammatories such as Advil, Aleve, Ibuprofen, Motrin, Naproxen, Naprosyn, Goodies powders or aspirin products NOW-OK to take Tylenol    ____ Stop supplements until after surgery.   ____ Bring C-Pap to the hospital.

## 2019-11-10 NOTE — Pre-Procedure Instructions (Signed)
EKG  ED ECG REPORT I, SUNG,JADE J, the attending physician, personally viewed and interpreted this ECG.   Date: 09/15/2019  EKG Time: 0108  Rate: 56  Rhythm: normal EKG, normal sinus rhythm  Axis: Normal  Intervals:none  ST&T Change: Nonspecific  ____________________________________________  RADIOLOGY  ED MD interpretation: Negative radiographs of pelvis and left hip/fib  Official radiology report(s): DG Pelvis 1-2 Views  Result Date: 09/15/2019 CLINICAL DATA:  Fall EXAM: PELVIS - 1-2 VIEW COMPARISON:  09/02/2016 FINDINGS: There is no evidence of pelvic fracture or diastasis. No pelvic bone lesions are seen. Unchanged appearance of bilateral femoral hardware. L4 vertebroplasty. IMPRESSION: Negative. Electronically Signed   By: Ulyses Jarred M.D.   On: 09/15/2019 01:44   DG Tibia/Fibula Left  Result Date: 09/15/2019 CLINICAL DATA:  Fall EXAM: LEFT TIBIA AND FIBULA - 2 VIEW COMPARISON:  None. FINDINGS: There is no evidence of fracture or other focal bone lesions. Mild left knee osteoarthrosis. Soft tissues are unremarkable. IMPRESSION: Negative. Electronically Signed   By: Ulyses Jarred M.D.   On: 09/15/2019 01:45    ____________________________________________   PROCEDURES  Procedure(s) performed (including Critical Care):  .1-3 Lead EKG Interpretation Performed by: Paulette Blanch, MD Authorized by: Paulette Blanch, MD     Interpretation: non-specific     ECG rate:  55   ECG rate assessment: bradycardic     Rhythm: sinus bradycardia     Ectopy: none     Conduction: normal   Comments:     Patient placed on cardiac monitor to evaluate for arrhythmias     ____________________________________________   INITIAL IMPRESSION / ASSESSMENT AND PLAN / ED COURSE  As part of my medical decision making, I reviewed the following data within the [facility] notes reviewed and incorporated, Labs reviewed, EKG interpreted, Old chart  reviewed, Radiograph reviewed and Notes from prior ED visits     Caroline Aguilar was evaluated in Emergency Department on 09/15/2019 for the symptoms described in the history of present illness. She was evaluated in the context of the global COVID-19 pandemic, which necessitated consideration that the patient might be at risk for infection with the SARS-CoV-2 virus that causes COVID-19. Institutional protocols and algorithms that pertain to the evaluation of patients at risk for COVID-19 are in a state of rapid change based on information released by regulatory bodies including the CDC and federal and state organizations. These policies and algorithms were followed during the patient's care in the ED.   86 year old female presenting with left lower leg skin tear and pelvis/left hip pain status post mechanical fall.  Differential diagnosis includes but is not limited to fracture, dislocation, musculoskeletal injury, etc.  Will obtain x-ray imaging studies, EKG, UA and reassess.  Update tetanus.      Clinical Course as of Sep 14 624  Mon Sep 15, 2019  0306 Updated patient and family member of imaging and UA results.  Fosfomycin for trace leukocytes and 6-10 WBC.  Tramadol for pain.  Will refill tramadol which patient is taking currently.  Strict return precautions given.  Both verbalized understanding and agree with plan of care.   [JS]    Clinical Course User Index [JS] Paulette Blanch, MD     ____________________________________________   FINAL CLINICAL IMPRESSION(S) / ED DIAGNOSES  Final diagnoses:  Fall, initial encounter  Noninfected skin tear of leg, left, initial encounter        ED Discharge Orders  Ordered     traMADol (ULTRAM) 50 MG tablet  Every 6 hours PRN     09/15/19 8375            Note:  This document was prepared using Dragon voice recognition software and may include unintentional dictation errors.   Paulette Blanch,  MD 09/15/19 [phone]         Electronically signed by Paulette Blanch, MD at 09/15/2019 6:26 AM  ED on 09/15/2019   ED on 09/15/2019     Detailed Report    Note shared with patient

## 2019-11-12 ENCOUNTER — Other Ambulatory Visit: Payer: Self-pay

## 2019-11-12 ENCOUNTER — Other Ambulatory Visit
Admission: RE | Admit: 2019-11-12 | Discharge: 2019-11-12 | Disposition: A | Discharge status: Home / Self Care | Payer: Medicare HMO | Source: Ambulatory Visit | Attending: Podiatry | Admitting: Podiatry

## 2019-11-12 DIAGNOSIS — Z01812 Encounter for preprocedural laboratory examination: Secondary | ICD-10-CM | POA: Insufficient documentation

## 2019-11-12 DIAGNOSIS — Z20822 Contact with and (suspected) exposure to covid-19: Secondary | ICD-10-CM | POA: Insufficient documentation

## 2019-11-12 LAB — SARS CORONAVIRUS 2 (TAT 6-24 HRS): SARS Coronavirus 2: NEGATIVE

## 2019-11-14 ENCOUNTER — Encounter
Admission: RE | Disposition: A | Discharge status: Home / Self Care | Payer: Self-pay | Source: Home / Self Care | Attending: Podiatry

## 2019-11-14 ENCOUNTER — Ambulatory Visit: Payer: Medicare HMO | Admitting: Anesthesiology

## 2019-11-14 ENCOUNTER — Encounter: Payer: Self-pay | Admitting: Podiatry

## 2019-11-14 ENCOUNTER — Other Ambulatory Visit: Payer: Self-pay

## 2019-11-14 ENCOUNTER — Ambulatory Visit
Admission: RE | Admit: 2019-11-14 | Discharge: 2019-11-14 | Disposition: A | Discharge status: Home / Self Care | Payer: Medicare HMO | Attending: Podiatry | Admitting: Podiatry

## 2019-11-14 DIAGNOSIS — Z8673 Personal history of transient ischemic attack (TIA), and cerebral infarction without residual deficits: Secondary | ICD-10-CM | POA: Diagnosis not present

## 2019-11-14 DIAGNOSIS — M2570 Osteophyte, unspecified joint: Secondary | ICD-10-CM | POA: Diagnosis not present

## 2019-11-14 DIAGNOSIS — E785 Hyperlipidemia, unspecified: Secondary | ICD-10-CM | POA: Insufficient documentation

## 2019-11-14 DIAGNOSIS — M2041 Other hammer toe(s) (acquired), right foot: Secondary | ICD-10-CM | POA: Insufficient documentation

## 2019-11-14 DIAGNOSIS — Z880 Allergy status to penicillin: Secondary | ICD-10-CM | POA: Insufficient documentation

## 2019-11-14 DIAGNOSIS — Z7901 Long term (current) use of anticoagulants: Secondary | ICD-10-CM | POA: Insufficient documentation

## 2019-11-14 DIAGNOSIS — Z9049 Acquired absence of other specified parts of digestive tract: Secondary | ICD-10-CM | POA: Insufficient documentation

## 2019-11-14 DIAGNOSIS — K2100 Gastro-esophageal reflux disease with esophagitis, without bleeding: Secondary | ICD-10-CM | POA: Insufficient documentation

## 2019-11-14 DIAGNOSIS — Z881 Allergy status to other antibiotic agents status: Secondary | ICD-10-CM | POA: Diagnosis not present

## 2019-11-14 DIAGNOSIS — K219 Gastro-esophageal reflux disease without esophagitis: Secondary | ICD-10-CM | POA: Diagnosis not present

## 2019-11-14 DIAGNOSIS — Z79899 Other long term (current) drug therapy: Secondary | ICD-10-CM | POA: Insufficient documentation

## 2019-11-14 DIAGNOSIS — G500 Trigeminal neuralgia: Secondary | ICD-10-CM | POA: Diagnosis not present

## 2019-11-14 DIAGNOSIS — M898X9 Other specified disorders of bone, unspecified site: Secondary | ICD-10-CM | POA: Diagnosis not present

## 2019-11-14 DIAGNOSIS — H409 Unspecified glaucoma: Secondary | ICD-10-CM | POA: Diagnosis not present

## 2019-11-14 DIAGNOSIS — Z888 Allergy status to other drugs, medicaments and biological substances status: Secondary | ICD-10-CM | POA: Diagnosis not present

## 2019-11-14 DIAGNOSIS — I6521 Occlusion and stenosis of right carotid artery: Secondary | ICD-10-CM | POA: Diagnosis not present

## 2019-11-14 DIAGNOSIS — M899 Disorder of bone, unspecified: Secondary | ICD-10-CM | POA: Diagnosis not present

## 2019-11-14 DIAGNOSIS — I480 Paroxysmal atrial fibrillation: Secondary | ICD-10-CM | POA: Diagnosis not present

## 2019-11-14 DIAGNOSIS — M898X7 Other specified disorders of bone, ankle and foot: Secondary | ICD-10-CM | POA: Diagnosis not present

## 2019-11-14 HISTORY — PX: BONE EXCISION: SHX6730

## 2019-11-14 SURGERY — BONE EXCISION
Anesthesia: General | Laterality: Right

## 2019-11-14 MED ORDER — PROPOFOL 500 MG/50ML IV EMUL
INTRAVENOUS | Status: DC | PRN
  Administered 2019-11-14: 120 ug/kg/min via INTRAVENOUS

## 2019-11-14 MED ORDER — HYDROCODONE-ACETAMINOPHEN 5-325 MG PO TABS: 1.0000 | ORAL_TABLET | ORAL | 0 refills | Status: DC | PRN

## 2019-11-14 MED ORDER — ORAL CARE MOUTH RINSE: 15.0000 mL | Freq: Once | OROMUCOSAL | Status: DC

## 2019-11-14 MED ORDER — FENTANYL CITRATE (PF) 100 MCG/2ML IJ SOLN
INTRAMUSCULAR | Status: AC
  Filled 2019-11-14: qty 2

## 2019-11-14 MED ORDER — CHLORHEXIDINE GLUCONATE 0.12 % MT SOLN
OROMUCOSAL | Status: AC
  Administered 2019-11-14: 15 mL
  Filled 2019-11-14: qty 15

## 2019-11-14 MED ORDER — ONDANSETRON HCL 4 MG/2ML IJ SOLN: 4.0000 mg | Freq: Once | INTRAMUSCULAR | Status: DC | PRN

## 2019-11-14 MED ORDER — CLINDAMYCIN PHOSPHATE 900 MG/50ML IV SOLN
900.0000 mg | INTRAVENOUS | Status: AC
  Administered 2019-11-14: 900 mg via INTRAVENOUS

## 2019-11-14 MED ORDER — LACTATED RINGERS IV SOLN: INTRAVENOUS | Status: DC

## 2019-11-14 MED ORDER — BUPIVACAINE HCL (PF) 0.5 % IJ SOLN
INTRAMUSCULAR | Status: DC | PRN
  Administered 2019-11-14: 5 mL

## 2019-11-14 MED ORDER — CHLORHEXIDINE GLUCONATE 0.12 % MT SOLN: 15.0000 mL | Freq: Once | OROMUCOSAL | Status: DC

## 2019-11-14 MED ORDER — EPHEDRINE SULFATE 50 MG/ML IJ SOLN
INTRAMUSCULAR | Status: DC | PRN
  Administered 2019-11-14 (×3): 10 mg via INTRAVENOUS

## 2019-11-14 MED ORDER — FENTANYL CITRATE (PF) 100 MCG/2ML IJ SOLN
INTRAMUSCULAR | Status: DC | PRN
  Administered 2019-11-14: 25 ug via INTRAVENOUS

## 2019-11-14 MED ORDER — PROPOFOL 10 MG/ML IV BOLUS
INTRAVENOUS | Status: DC | PRN
  Administered 2019-11-14: 50 mg via INTRAVENOUS

## 2019-11-14 MED ORDER — NEOMYCIN-POLYMYXIN B GU 40-200000 IR SOLN
Status: DC | PRN
  Administered 2019-11-14: 4 mL

## 2019-11-14 MED ORDER — CLINDAMYCIN PHOSPHATE 900 MG/50ML IV SOLN
INTRAVENOUS | Status: AC
  Filled 2019-11-14: qty 50

## 2019-11-14 MED ORDER — PROPOFOL 500 MG/50ML IV EMUL
INTRAVENOUS | Status: AC
  Filled 2019-11-14: qty 50

## 2019-11-14 MED ORDER — FENTANYL CITRATE (PF) 100 MCG/2ML IJ SOLN: 25.0000 ug | INTRAMUSCULAR | Status: DC | PRN

## 2019-11-14 MED ORDER — POVIDONE-IODINE 7.5 % EX SOLN
Freq: Once | CUTANEOUS | Status: DC
  Filled 2019-11-14: qty 118

## 2019-11-14 SURGICAL SUPPLY — 42 items
BLADE OSC/SAGITTAL MD 5.5X18 (BLADE) ×3 IMPLANT
BLADE SURG 15 STRL LF DISP TIS (BLADE) ×2 IMPLANT
BLADE SURG 15 STRL SS (BLADE) ×6
BLADE SURG MINI STRL (BLADE) ×3 IMPLANT
BNDG CMPR STD VLCR NS LF 5.8X4 (GAUZE/BANDAGES/DRESSINGS)
BNDG CMPR STD VLCR NS LF 5.8X6 (GAUZE/BANDAGES/DRESSINGS) ×1
BNDG CONFORM 2 STRL LF (GAUZE/BANDAGES/DRESSINGS) ×3 IMPLANT
BNDG CONFORM 3 STRL LF (GAUZE/BANDAGES/DRESSINGS) IMPLANT
BNDG ELASTIC 4X5.8 VLCR NS LF (GAUZE/BANDAGES/DRESSINGS) IMPLANT
BNDG ELASTIC 6X5.8 VLCR NS LF (GAUZE/BANDAGES/DRESSINGS) ×3 IMPLANT
BNDG ESMARK 4X12 TAN STRL LF (GAUZE/BANDAGES/DRESSINGS) ×3 IMPLANT
CANISTER SUCT 1200ML W/VALVE (MISCELLANEOUS) ×3 IMPLANT
CLOSURE WOUND 1/4X4 (GAUZE/BANDAGES/DRESSINGS)
COVER WAND RF STERILE (DRAPES) ×3 IMPLANT
CUFF TOURN SGL QUICK 12 (TOURNIQUET CUFF) ×3 IMPLANT
CUFF TOURN SGL QUICK 18X4 (TOURNIQUET CUFF) IMPLANT
DRAPE FLUOR MINI C-ARM 54X84 (DRAPES) ×3 IMPLANT
DURAPREP 26ML APPLICATOR (WOUND CARE) ×3 IMPLANT
ELECT REM PT RETURN 9FT ADLT (ELECTROSURGICAL) ×3
ELECTRODE REM PT RTRN 9FT ADLT (ELECTROSURGICAL) ×1 IMPLANT
GAUZE SPONGE 4X4 12PLY STRL (GAUZE/BANDAGES/DRESSINGS) ×3 IMPLANT
GAUZE XEROFORM 1X8 LF (GAUZE/BANDAGES/DRESSINGS) ×3 IMPLANT
GLOVE BIO SURGEON STRL SZ7.5 (GLOVE) ×3 IMPLANT
GLOVE INDICATOR 8.0 STRL GRN (GLOVE) ×3 IMPLANT
GOWN STRL REUS W/ TWL LRG LVL3 (GOWN DISPOSABLE) ×2 IMPLANT
GOWN STRL REUS W/TWL LRG LVL3 (GOWN DISPOSABLE) ×6
KIT TURNOVER KIT A (KITS) ×3 IMPLANT
NDL SAFETY ECLIPSE 18X1.5 (NEEDLE) ×1 IMPLANT
NEEDLE FILTER BLUNT 18X 1/2SAF (NEEDLE) ×2
NEEDLE FILTER BLUNT 18X1 1/2 (NEEDLE) ×1 IMPLANT
NEEDLE HYPO 18GX1.5 SHARP (NEEDLE) ×3
NEEDLE HYPO 22GX1.5 SAFETY (NEEDLE) ×3 IMPLANT
NEEDLE HYPO 25X1 1.5 SAFETY (NEEDLE) ×3 IMPLANT
NS IRRIG 500ML POUR BTL (IV SOLUTION) ×3 IMPLANT
PACK EXTREMITY (MISCELLANEOUS) ×3 IMPLANT
PAD PREP 24X41 OB/GYN DISP (PERSONAL CARE ITEMS) ×3 IMPLANT
PENCIL ELECTRO HAND CTR (MISCELLANEOUS) ×3 IMPLANT
STRIP CLOSURE SKIN 1/4X4 (GAUZE/BANDAGES/DRESSINGS) IMPLANT
SUT ETHILON 5-0 FS-2 18 BLK (SUTURE) ×3 IMPLANT
SUT VIC AB 4-0 FS2 27 (SUTURE) ×3 IMPLANT
SYR 10ML LL (SYRINGE) ×3 IMPLANT
SYR 5ML LL (SYRINGE) ×3 IMPLANT

## 2019-11-14 NOTE — Discharge Instructions (Signed)
AMBULATORY SURGERY  DISCHARGE INSTRUCTIONS   1) The drugs that you were given will stay in your system until tomorrow so for the next 24 hours you should not:  A) Drive an automobile B) Make any legal decisions C) Drink any alcoholic beverage   2) You may resume regular meals tomorrow.  Today it is better to start with liquids and gradually work up to solid foods.  You may eat anything you prefer, but it is better to start with liquids, then soup and crackers, and gradually work up to solid foods.   3) Please notify your doctor immediately if you have any unusual bleeding, trouble breathing, redness and pain at the surgery site, drainage, fever, or pain not relieved by medication. 4)   5) Your post-operative visit with Dr.                                     is: Date:                        Time:    Please call to schedule your post-operative visit.  6) Additional Instructions:  1.  Elevate the right lower extremity on pillows.  2.  Keep the bandage on the right foot clean, dry, and do not remove.  3.  Sponge bathe only right lower extremity.  4.  Wear the surgical shoe on the right foot whenever walking or standing.  5.  Take 1 pain pill, Norco, every 4 hours only if needed for pain.

## 2019-11-14 NOTE — Op Note (Signed)
Date of operation: 11/14/2019.  Surgeon: Durward Fortes D.P.M.  Preoperative diagnosis: Hammertoe with exostosis right fifth toe.  Postoperative diagnosis: Same.  Procedure: Arthroplasty right fifth toe.  Anesthesia: Local MAC.  Hemostasis: Pneumatic tourniquet right ankle 250 mmHg.  Estimated blood loss: Less than 5 cc.  Pathology: Bone proximal phalanx right fifth toe.  Complications: None apparent.  Operative indications: This is a 86 year old female with a history of a chronic painful hammertoe and corn on her right fifth toe.  Conservative care has proved unsuccessful at relieving her pain and she elects for surgical intervention.  Operative procedure: Patient was taken to the operating room and placed on the table in the supine position.  Following satisfactory sedation the right fifth toe and lateral forefoot was anesthetized with 5 cc of 0.5 percent Marcaine plain.  Pneumatic tourniquet applied at the level of the right ankle.  The foot was prepped and draped in usual sterile fashion.  The foot was exsanguinated and the tourniquet inflated to 250 mmHg.     Attention was directed towards the dorsal aspect of the right fifth toe where an approximate 2 cm linear incision was made coursing proximal to distal over the proximal inner phalangeal joint.  Dissection carried down to the level of the joint where a transverse tenotomy was performed and the capsular and periosteal tissues reflected off of the head of the proximal phalanx.  This was then resected in toto using a sagittal saw.  Tissues were freed up from around the lateral aspect of the middle phalanx area and the lateral portion of the middle phalanx was resected.  Intraoperative FluoroScan views revealed good resection of the bony deformity.  The wound was flushed with copious amounts of sterile saline and closed using 4-0 Vicryl simple interrupted suture for tendon reapproximation followed by skin closure using 5-0 nylon simple  interrupted suture.  Xeroform 4 x 4's and con form applied to the right fifth toe and forefoot.  Tourniquet was released and blood flow noted to return immediately to the digits.  Patient tolerated the procedure and anesthesia well and was awakened and transported to the PACU with vital signs stable and in good condition.

## 2019-11-14 NOTE — Anesthesia Preprocedure Evaluation (Signed)
Anesthesia Evaluation  Patient identified by MRN, date of birth, ID band Patient awake    Reviewed: Allergy & Precautions, NPO status , Patient's Chart, lab work & pertinent test results  History of Anesthesia Complications Negative for: history of anesthetic complications  Airway Mallampati: III       Dental   Pulmonary shortness of breath and with exertion, neg sleep apnea, neg COPD, Not current smoker, former smoker,    Pulmonary exam normal        Cardiovascular (-) hypertension+ angina (-) Past MI and (-) CHF Normal cardiovascular exam+ dysrhythmias Atrial Fibrillation (-) Valvular Problems/Murmurs     Neuro/Psych neg Seizures CVA (R sided weakness), Residual Symptoms negative psych ROS   GI/Hepatic Neg liver ROS, GERD  Medicated and Controlled,  Endo/Other  negative endocrine ROSneg diabetes  Renal/GU negative Renal ROS  negative genitourinary   Musculoskeletal   Abdominal Normal abdominal exam  (+)   Peds negative pediatric ROS (+)  Hematology   Anesthesia Other Findings Past Medical History: No date: A-fib ([place]) No date: Angina pectoris (HCC) No date: Cancer ([place])     Comment:  Colon cancer No date: Falls frequently No date: GERD (gastroesophageal reflux disease) No date: Hyperlipidemia No date: Neuropathy  Reproductive/Obstetrics                             Anesthesia Physical  Anesthesia Plan  ASA: III  Anesthesia Plan: General   Post-op Pain Management:    Induction: Intravenous  PONV Risk Score and Plan: TIVA  Airway Management Planned: Nasal Cannula  Additional Equipment:   Intra-op Plan:   Post-operative Plan:   Informed Consent: I have reviewed the patients History and Physical, chart, labs and discussed the procedure including the risks, benefits and alternatives for the proposed anesthesia with the patient or authorized representative who has indicated  his/her understanding and acceptance.       Plan Discussed with: CRNA and Surgeon  Anesthesia Plan Comments:         Anesthesia Quick Evaluation

## 2019-11-14 NOTE — Transfer of Care (Signed)
Immediate Anesthesia Transfer of Care Note  Patient: Caroline Aguilar  Procedure(s) Performed: arthroplasty right foot fifth toe (Right )  Patient Location: PACU  Anesthesia Type:General  Level of Consciousness: awake and alert   Airway & Oxygen Therapy: Patient Spontanous Breathing  Post-op Assessment: Report given to RN and Post -op Vital signs reviewed and stable  Post vital signs: Reviewed and stable  Last Vitals:  Vitals Value Taken Time  BP 142/54 11/14/19 0823  Temp    Pulse 55 11/14/19 0824  Resp 18 11/14/19 0824  SpO2 97 % 11/14/19 0824  Vitals shown include unvalidated device data.  Last Pain:  Vitals:   11/14/19 0637  TempSrc: Oral  PainSc: 0-No pain         Complications: No complications documented.

## 2019-11-14 NOTE — H&P (Signed)
Subjective: Patient presents today for surgery for arthroplasty on her right fifth toe.  Objective: Neurovascular status intact.  Hammertoe contracture on the right fifth toe with palpable exostosis.  Some hyperkeratosis but no evidence of ulceration.  Assessment: Hammertoe with exostosis right fifth toe.  Plan: Medical history in the chart was reviewed.  No changes in her podiatric examination.  Patient stable for surgery for arthroplasty on the right fifth toe.

## 2019-11-14 NOTE — Interval H&P Note (Signed)
History and Physical Interval Note:  11/14/2019 8:37 AM  Caroline Aguilar  has presented today for surgery, with the diagnosis of M20.41 HAMMERTOE RIGHT M89.8X9 EXOSTOSIS.  The various methods of treatment have been discussed with the patient and family. After consideration of risks, benefits and other options for treatment, the patient has consented to  Procedure(s): arthroplasty right foot fifth toe (Right) as a surgical intervention.  The patient's history has been reviewed, patient examined, no change in status, stable for surgery.  I have reviewed the patient's chart and labs.  Questions were answered to the patient's satisfaction.     Durward Fortes

## 2019-11-15 ENCOUNTER — Encounter: Payer: Self-pay | Admitting: Podiatry

## 2019-11-17 LAB — SURGICAL PATHOLOGY

## 2019-11-17 NOTE — Anesthesia Postprocedure Evaluation (Signed)
Anesthesia Post Note  Patient: Caroline Aguilar  Procedure(s) Performed: arthroplasty right foot fifth toe (Right )  Patient location during evaluation: PACU Anesthesia Type: General Level of consciousness: awake and alert and oriented Pain management: pain level controlled Vital Signs Assessment: post-procedure vital signs reviewed and stable Respiratory status: spontaneous breathing Cardiovascular status: blood pressure returned to baseline Anesthetic complications: no   No complications documented.   Last Vitals:  Vitals:   11/14/19 0909 11/14/19 1006  BP: (!) 188/75 (!) 161/75  Pulse: 58   Resp: 16 16  Temp: (!) 36.3 C   SpO2: 98% 100%    Last Pain:  Vitals:   11/14/19 0909  TempSrc: Temporal  PainSc: 4                  Arsema Tusing

## 2019-11-18 ENCOUNTER — Ambulatory Visit: Payer: Medicare HMO

## 2019-11-18 ENCOUNTER — Other Ambulatory Visit: Payer: Medicare HMO

## 2019-11-19 DIAGNOSIS — M810 Age-related osteoporosis without current pathological fracture: Secondary | ICD-10-CM | POA: Diagnosis not present

## 2019-11-19 DIAGNOSIS — M2041 Other hammer toe(s) (acquired), right foot: Secondary | ICD-10-CM | POA: Diagnosis not present

## 2019-12-15 ENCOUNTER — Other Ambulatory Visit: Payer: Self-pay | Admitting: Orthopedic Surgery

## 2019-12-15 DIAGNOSIS — S32010A Wedge compression fracture of first lumbar vertebra, initial encounter for closed fracture: Secondary | ICD-10-CM | POA: Diagnosis not present

## 2019-12-16 ENCOUNTER — Other Ambulatory Visit: Payer: Self-pay

## 2019-12-16 ENCOUNTER — Other Ambulatory Visit
Admission: RE | Admit: 2019-12-16 | Discharge: 2019-12-16 | Disposition: A | Discharge status: Home / Self Care | Payer: Medicare HMO | Source: Ambulatory Visit | Attending: Orthopedic Surgery | Admitting: Orthopedic Surgery

## 2019-12-16 DIAGNOSIS — Z20822 Contact with and (suspected) exposure to covid-19: Secondary | ICD-10-CM | POA: Diagnosis not present

## 2019-12-16 DIAGNOSIS — Z01812 Encounter for preprocedural laboratory examination: Secondary | ICD-10-CM | POA: Insufficient documentation

## 2019-12-16 LAB — SARS CORONAVIRUS 2 (TAT 6-24 HRS): SARS Coronavirus 2: NEGATIVE

## 2019-12-16 NOTE — Patient Instructions (Signed)
Your procedure is scheduled on: Wednesday 12/18/19.  Report to [facility] at 2:45pm.   Remember: Instructions that are not followed completely may result in serious medical risk, up to and including death, or upon the discretion of your surgeon and anesthesiologist your surgery may need to be rescheduled.     __X__ 1. Do not eat food after midnight the night before your procedure.                 No gum chewing or hard candies. You may drink clear liquids up to 2 hours                 before you are scheduled to arrive for your surgery- DO NOT drink clear                 liquids within 2 hours of the start of your surgery.                 Clear Liquids include:  water, apple juice without pulp, clear carbohydrate                 drink such as Clearfast or Gatorade, Black Coffee or Tea (Do not add                 milk or creamer to coffee or tea).  __X__2.  On the morning of surgery brush your teeth with toothpaste and water, you may rinse your mouth with mouthwash if you wish.  Do not swallow any toothpaste or mouthwash.    __X__ 3.  No Alcohol for 24 hours before or after surgery.  __X__ 4.  Do Not Smoke or use e-cigarettes For 24 Hours Prior to Your Surgery.                 Do not use any chewable tobacco products for at least 6 hours prior to                 surgery.  __X__5.  Notify your doctor if there is any change in your medical condition      (cold, fever, infections).      Do NOT wear jewelry, make-up, hairpins, clips or nail polish. Do NOT wear lotions, powders, or perfumes.  Do NOT shave 48 hours prior to surgery. Men may shave face and neck. Do NOT bring valuables to the hospital.     [facility] is not responsible for any belongings or valuables.   Contacts, dentures/partials or body piercings may not be worn into surgery. Bring a case for your contacts, glasses or hearing aids, a denture cup will be  supplied. Leave your suitcase in the car. After surgery it may be brought to your room.   For patients admitted to the hospital, discharge time is determined by your treatment team.    Patients discharged the day of surgery will not be allowed to drive home.     __X__ Take these medicines the morning of surgery with A SIP OF WATER:     1. amiodarone (PACERONE)   2. buPROPion (WELLBUTRIN   3. omeprazole (PRILOSEC)  4. sertraline (ZOLOFT)     __X__ Shower prior to arrival.  __X__ Stop Blood Thinners: Eliquis.  __X__ Stop Anti-inflammatories 7 days before surgery such as Advil, Ibuprofen, Motrin, BC or Goodies Powder, Naprosyn, Naproxen, Aleve, Aspirin, Meloxicam. May take Tylenol if needed for pain or discomfort.   __X__Do not start taking  any new herbal supplements or vitamins prior to your procedure.     Wear comfortable clothing (specific to your surgery type) to the hospital.  Plan for stool softeners for home use; pain medications have a tendency to cause constipation. You can also help prevent constipation by eating foods high in fiber such as fruits and vegetables and drinking plenty of fluids as your diet allows.  After surgery, you can prevent lung complications by doing breathing exercises.Take deep breaths and cough every 1-2 hours. Your doctor may order a device called an Incentive Spirometer to help you take deep breaths.  Please call the [facility] Department at [phone] if you have any questions about these instructions

## 2019-12-17 ENCOUNTER — Other Ambulatory Visit: Payer: Self-pay

## 2019-12-17 ENCOUNTER — Encounter
Admission: RE | Disposition: A | Discharge status: Home / Self Care | Payer: Self-pay | Source: Home / Self Care | Attending: Orthopedic Surgery

## 2019-12-17 ENCOUNTER — Ambulatory Visit: Payer: Medicare HMO | Admitting: Anesthesiology

## 2019-12-17 ENCOUNTER — Encounter: Payer: Self-pay | Admitting: Orthopedic Surgery

## 2019-12-17 ENCOUNTER — Ambulatory Visit
Admission: RE | Admit: 2019-12-17 | Discharge: 2019-12-17 | Disposition: A | Discharge status: Home / Self Care | Payer: Medicare HMO | Attending: Orthopedic Surgery | Admitting: Orthopedic Surgery

## 2019-12-17 ENCOUNTER — Ambulatory Visit: Payer: Medicare HMO

## 2019-12-17 DIAGNOSIS — S32010A Wedge compression fracture of first lumbar vertebra, initial encounter for closed fracture: Secondary | ICD-10-CM | POA: Diagnosis not present

## 2019-12-17 DIAGNOSIS — X58XXXA Exposure to other specified factors, initial encounter: Secondary | ICD-10-CM | POA: Diagnosis not present

## 2019-12-17 DIAGNOSIS — M4856XA Collapsed vertebra, not elsewhere classified, lumbar region, initial encounter for fracture: Secondary | ICD-10-CM | POA: Diagnosis not present

## 2019-12-17 DIAGNOSIS — Z8673 Personal history of transient ischemic attack (TIA), and cerebral infarction without residual deficits: Secondary | ICD-10-CM | POA: Insufficient documentation

## 2019-12-17 DIAGNOSIS — Z86010 Personal history of colonic polyps: Secondary | ICD-10-CM | POA: Diagnosis not present

## 2019-12-17 DIAGNOSIS — I4891 Unspecified atrial fibrillation: Secondary | ICD-10-CM | POA: Diagnosis not present

## 2019-12-17 DIAGNOSIS — Z79899 Other long term (current) drug therapy: Secondary | ICD-10-CM | POA: Diagnosis not present

## 2019-12-17 DIAGNOSIS — Z7902 Long term (current) use of antithrombotics/antiplatelets: Secondary | ICD-10-CM | POA: Diagnosis not present

## 2019-12-17 DIAGNOSIS — K219 Gastro-esophageal reflux disease without esophagitis: Secondary | ICD-10-CM | POA: Insufficient documentation

## 2019-12-17 DIAGNOSIS — Z419 Encounter for procedure for purposes other than remedying health state, unspecified: Secondary | ICD-10-CM

## 2019-12-17 DIAGNOSIS — H409 Unspecified glaucoma: Secondary | ICD-10-CM | POA: Diagnosis not present

## 2019-12-17 DIAGNOSIS — Z87891 Personal history of nicotine dependence: Secondary | ICD-10-CM | POA: Diagnosis not present

## 2019-12-17 DIAGNOSIS — Z85038 Personal history of other malignant neoplasm of large intestine: Secondary | ICD-10-CM | POA: Diagnosis not present

## 2019-12-17 DIAGNOSIS — G500 Trigeminal neuralgia: Secondary | ICD-10-CM | POA: Diagnosis not present

## 2019-12-17 DIAGNOSIS — E785 Hyperlipidemia, unspecified: Secondary | ICD-10-CM | POA: Insufficient documentation

## 2019-12-17 DIAGNOSIS — Z7901 Long term (current) use of anticoagulants: Secondary | ICD-10-CM | POA: Insufficient documentation

## 2019-12-17 DIAGNOSIS — I480 Paroxysmal atrial fibrillation: Secondary | ICD-10-CM | POA: Diagnosis not present

## 2019-12-17 DIAGNOSIS — Z981 Arthrodesis status: Secondary | ICD-10-CM | POA: Diagnosis not present

## 2019-12-17 HISTORY — PX: KYPHOPLASTY: SHX5884

## 2019-12-17 SURGERY — KYPHOPLASTY
Anesthesia: General

## 2019-12-17 MED ORDER — CLINDAMYCIN PHOSPHATE 900 MG/50ML IV SOLN
900.0000 mg | INTRAVENOUS | Status: AC
  Administered 2019-12-17: 900 mg via INTRAVENOUS

## 2019-12-17 MED ORDER — BUPIVACAINE-EPINEPHRINE (PF) 0.5% -1:200000 IJ SOLN
INTRAMUSCULAR | Status: AC
  Filled 2019-12-17: qty 30

## 2019-12-17 MED ORDER — PROPOFOL 10 MG/ML IV BOLUS
INTRAVENOUS | Status: AC
  Filled 2019-12-17: qty 20

## 2019-12-17 MED ORDER — CHLORHEXIDINE GLUCONATE 0.12 % MT SOLN
15.0000 mL | Freq: Once | OROMUCOSAL | Status: AC
  Administered 2019-12-17: 15 mL via OROMUCOSAL

## 2019-12-17 MED ORDER — LIDOCAINE HCL (PF) 1 % IJ SOLN
INTRAMUSCULAR | Status: AC
  Filled 2019-12-17: qty 30

## 2019-12-17 MED ORDER — BUPIVACAINE-EPINEPHRINE (PF) 0.5% -1:200000 IJ SOLN
INTRAMUSCULAR | Status: DC | PRN
  Administered 2019-12-17: 10 mL via PERINEURAL

## 2019-12-17 MED ORDER — LACTATED RINGERS IV SOLN: INTRAVENOUS | Status: DC

## 2019-12-17 MED ORDER — ORAL CARE MOUTH RINSE: 15.0000 mL | Freq: Once | OROMUCOSAL | Status: AC

## 2019-12-17 MED ORDER — CLINDAMYCIN PHOSPHATE 900 MG/50ML IV SOLN
INTRAVENOUS | Status: AC
  Filled 2019-12-17: qty 50

## 2019-12-17 MED ORDER — PROPOFOL 10 MG/ML IV BOLUS
INTRAVENOUS | Status: DC | PRN
  Administered 2019-12-17: 25 mg via INTRAVENOUS

## 2019-12-17 MED ORDER — IOHEXOL 180 MG/ML  SOLN
INTRAMUSCULAR | Status: DC | PRN
  Administered 2019-12-17: 9 mL

## 2019-12-17 MED ORDER — CLINDAMYCIN PHOSPHATE 600 MG/50ML IV SOLN
INTRAVENOUS | Status: AC
  Filled 2019-12-17: qty 50

## 2019-12-17 MED ORDER — PROPOFOL 500 MG/50ML IV EMUL
INTRAVENOUS | Status: DC | PRN
  Administered 2019-12-17: 50 ug/kg/min via INTRAVENOUS

## 2019-12-17 MED ORDER — ONDANSETRON HCL 4 MG/2ML IJ SOLN: 4.0000 mg | Freq: Once | INTRAMUSCULAR | Status: DC | PRN

## 2019-12-17 MED ORDER — FENTANYL CITRATE (PF) 100 MCG/2ML IJ SOLN: 25.0000 ug | INTRAMUSCULAR | Status: DC | PRN

## 2019-12-17 MED ORDER — CHLORHEXIDINE GLUCONATE 0.12 % MT SOLN
OROMUCOSAL | Status: AC
  Filled 2019-12-17: qty 15

## 2019-12-17 MED ORDER — LIDOCAINE HCL 1 % IJ SOLN
INTRAMUSCULAR | Status: DC | PRN
  Administered 2019-12-17 (×2): 10 mL

## 2019-12-17 SURGICAL SUPPLY — 20 items
CEMENT KYPHON CX01A KIT/MIXER (Cement) ×3 IMPLANT
COVER WAND RF STERILE (DRAPES) ×3 IMPLANT
DERMABOND ADVANCED (GAUZE/BANDAGES/DRESSINGS) ×2
DERMABOND ADVANCED .7 DNX12 (GAUZE/BANDAGES/DRESSINGS) ×1 IMPLANT
DEVICE BIOPSY BONE KYPHX (INSTRUMENTS) ×3 IMPLANT
DRAPE C-ARM XRAY 36X54 (DRAPES) ×3 IMPLANT
DURAPREP 26ML APPLICATOR (WOUND CARE) ×3 IMPLANT
GLOVE SURG SYN 9.0  PF PI (GLOVE) ×2
GLOVE SURG SYN 9.0 PF PI (GLOVE) ×1 IMPLANT
GOWN SRG 2XL LVL 4 RGLN SLV (GOWNS) ×1 IMPLANT
GOWN STRL NON-REIN 2XL LVL4 (GOWNS) ×2
GOWN STRL REUS W/ TWL LRG LVL3 (GOWN DISPOSABLE) ×1 IMPLANT
GOWN STRL REUS W/TWL LRG LVL3 (GOWN DISPOSABLE) ×2
PACK KYPHOPLASTY (MISCELLANEOUS) ×3 IMPLANT
RENTAL RFA  GENERATOR (MISCELLANEOUS)
RENTAL RFA GENERATOR (MISCELLANEOUS) IMPLANT
STRAP SAFETY 5IN WIDE (MISCELLANEOUS) ×3 IMPLANT
SWABSTK COMLB BENZOIN TINCTURE (MISCELLANEOUS) ×3 IMPLANT
TRAY KYPHOPAK 15/3 EXPRESS 1ST (MISCELLANEOUS) IMPLANT
TRAY KYPHOPAK 20/3 EXPRESS 1ST (MISCELLANEOUS) ×3 IMPLANT

## 2019-12-17 NOTE — Discharge Instructions (Addendum)
AMBULATORY SURGERY  DISCHARGE INSTRUCTIONS   1) The drugs that you were given will stay in your system until tomorrow so for the next 24 hours you should not:  A) Drive an automobile B) Make any legal decisions C) Drink any alcoholic beverage   2) You may resume regular meals tomorrow.  Today it is better to start with liquids and gradually work up to solid foods.  You may eat anything you prefer, but it is better to start with liquids, then soup and crackers, and gradually work up to solid foods.   3) Please notify your doctor immediately if you have any unusual bleeding, trouble breathing, redness and pain at the surgery site, drainage, fever, or pain not relieved by medication.    4) Additional Instructions:     Please contact your physician with any problems or Same Day Surgery at [phone], Monday through Friday 6 am to 4 pm, or [facility] at [facility] number at [phone].   Take it easy today and try to get walking tomorrow. Resume Eliquis tonight. Remove Band-Aid on Friday then okay to shower. Call office if you are having problems.

## 2019-12-17 NOTE — Anesthesia Preprocedure Evaluation (Signed)
Anesthesia Evaluation  Patient identified by MRN, date of birth, ID band Patient awake    Reviewed: Allergy & Precautions, NPO status , Patient's Chart, lab work & pertinent test results  History of Anesthesia Complications Negative for: history of anesthetic complications  Airway Mallampati: III       Dental  (+) Missing, Loose, Chipped   Pulmonary neg sleep apnea, neg COPD, former smoker,           Cardiovascular (-) hypertension(-) Past MI and (-) CHF (-) dysrhythmias + Valvular Problems/Murmurs (s/p arotic valve repair)      Neuro/Psych neg Seizures CVA (Left sided weakness resolved)    GI/Hepatic Neg liver ROS, GERD  Medicated and Controlled,  Endo/Other  neg diabetes  Renal/GU negative Renal ROS     Musculoskeletal   Abdominal   Peds  Hematology   Anesthesia Other Findings   Reproductive/Obstetrics                             Anesthesia Physical Anesthesia Plan  ASA: III  Anesthesia Plan: General   Post-op Pain Management:    Induction: Intravenous  PONV Risk Score and Plan: 3 and Propofol infusion, TIVA and Treatment may vary due to age or medical condition  Airway Management Planned: Nasal Cannula  Additional Equipment:   Intra-op Plan:   Post-operative Plan:   Informed Consent: I have reviewed the patients History and Physical, chart, labs and discussed the procedure including the risks, benefits and alternatives for the proposed anesthesia with the patient or authorized representative who has indicated his/her understanding and acceptance.       Plan Discussed with:   Anesthesia Plan Comments:         Anesthesia Quick Evaluation

## 2019-12-17 NOTE — H&P (Signed)
Chief Complaint  Patient presents with  . Back Pain  KYPHOPLASTY SURGICAL CONSULT   History of the Present Illness: Caroline Aguilar is a 86 y.o. female here for evaluation of an injury with compression fractures. She had an MRI done on 11/05/2019 that showed an L1 compression fracture as well as chronic T12, L2, and L4 compression fractures with prior augmentation at L2 and L4. L1 showed marrow edema and 50 percent height loss. She presents now to discuss whether she might want to have kyphoplasty, although 5 weeks-later. She has pain at her waistline.   The patient has had 2 prior kyphoplasty. She had hammertoe surgery. She was prescribed hydrocodone 5/325 mg, and she broke the tablet in half and it helped her foot and back symptoms. She previously was prescribed tramadol, but it made her nervous and was not as helpful.   The patient is on Plavix 2.5 mg for atrial fibrillation, which was last taken this morning on 12/15/2019.   I have reviewed past medical, surgical, social and family history, and allergies as documented in the EMR.  Past Medical History: Past Medical History:  Diagnosis Date  . Angina pectoris (CMS-HCC)  . Barrett's esophagus 07/08/2015  . Carotid stenosis, right  . Colon cancer (CMS-HCC)  four lymph nodes positive, stage T3N2M0, Stage IIIC, post chemo  . Glaucoma (increased eye pressure)  . History of stroke  . Hyperlipidemia  . Neuropathy  both feet from prior chemotherapy  . Personal history of colonic polyps  . Reflux esophagitis 07/08/2015  . Shingles  left chest 12/2011  . Trigeminal neuralgia   Past Surgical History: Past Surgical History:  Procedure Laterality Date  . BLADDER TACK  . COLON SURGERY 2007  Right hemicolectomy  . COLONOSCOPY 05/19/2005 and 05/15/2006  . EGD 07/08/2015  Reflux esophagitis/Barrett's/No Repeat/PYO  . HYSTERECTOMY  BSO  . L2 kyphoplasty with biopsy 01/29/14  . LEFT HIP FRACTURE STATUS POST PINNING 07/2008  . RIGHT BREAST  BIOPSY  . right hip fracture 5/16  . RIGHT TIBIA FRACTURE   Past Family History: Family History  Problem Relation Age of Onset  . Cancer Mother  reproductive system  . Liver cancer Son  . Breast cancer Paternal Aunt   Medications: Current Outpatient Medications Ordered in Epic  Medication Sig Dispense Refill  . acetaminophen (TYLENOL) 325 MG tablet Take by mouth  . AMIOdarone (PACERONE) 200 MG tablet Take 1 tablet (200 mg total) by mouth once daily 90 tablet 3  . amLODIPine (NORVASC) 5 MG tablet  . buPROPion (WELLBUTRIN XL) 150 MG XL tablet Take 1 tablet (150 mg total) by mouth once daily 30 tablet 11  . calcium carbonate-vitamin D3 (CALTRATE 600+D) 600 mg(1,500mg ) -200 unit tablet Take 1 tablet by mouth once daily.  . cyanocobalamin (VITAMIN B12) 1,000 mcg/mL injection Inject 1 mL (1,000 mcg total) into the muscle monthly 10 mL 1  . docusate (COLACE) 100 MG capsule Take by mouth  . ELIQUIS 2.5 mg tablet TAKE 1 TABLET(2.5 MG) BY MOUTH EVERY 12 HOURS 60 tablet 3  . FLUoxetine (PROZAC) 20 MG capsule  . gabapentin (NEURONTIN) 100 MG capsule TAKE 1 CAPSULE(100 MG) BY MOUTH EVERY NIGHT 30 capsule 1  . latanoprost (XALATAN) 0.005 % ophthalmic solution Place 1 drop into both eyes once daily.  Marland Kitchen omeprazole (PRILOSEC) 20 MG DR capsule Take 1 capsule (20 mg total) by mouth once daily 30 capsule 11  . pantoprazole (PROTONIX) 40 MG DR tablet  . sertraline (ZOLOFT) 50 MG tablet Take 1 tablet (  50 mg total) by mouth once daily 90 tablet 3  . simvastatin (ZOCOR) 20 MG tablet TAKE 1 TABLET(20 MG) BY MOUTH EVERY NIGHT 90 tablet 1  . timolol hemihydrate (BETIMOL) 0.5 % ophthalmic solution Place 1 drop into both eyes 2 (two) times daily.  . timoloL maleate (TIMOPTIC) 0.5 % ophthalmic solution Place 1 drop into both eyes 2 (two) times daily  . HYDROcodone-acetaminophen (NORCO) 5-325 mg tablet Take by mouth (Patient not taking: Reported on 12/15/2019 )   No current Epic-ordered facility-administered  medications on file.   Allergies: Allergies  Allergen Reactions  . Penicillins Anaphylaxis  . Cortisone Hallucination  . Effexor [Venlafaxine] Dizziness  . Famvir [Famciclovir] Unknown  . Tramadol Hallucination  . Zithromax [Azithromycin] Unknown    Body mass index is 30.11 kg/m.  Review of Systems: A comprehensive 14 point ROS was performed, reviewed, and the pertinent orthopaedic findings are documented in the HPI.  Vitals:  12/15/19 1413  BP: 126/80   General Physical Examination:  General/Constitutional: No apparent distress: well-nourished and well developed. Eyes: Pupils equal, round with synchronous movement. Lungs: Clear to auscultation HEENT: Normal Vascular: No edema, swelling or tenderness, except as noted in detailed exam. Cardiac: Heart rate and rhythm is regular. Integumentary: No impressive skin lesions present, except as noted in detailed exam. Neuro/Psych: Normal mood and affect, oriented to person, place and time.  Musculoskeletal Examination: On examination of the spine, there is tenderness at L1. Lungs are clear. Heart rate and rhythm is normal. HEENT is normal. Good dentition. No clonus. The right foot is in a boot for a prior hammertoe repair.  Radiographs: Lumbar MRI performed on 11/05/2019 demonstrates a L1 compression fracture, as well as chronic T12, L2 and L4 compression fractures with prior augmentation at L2 and L4. L1 showed marrow edema and 50% height loss.   AP and lateral x-rays of the lumbar spine were ordered and personally reviewed today. These show her L1 has had some compression, but it is not a vertebral plana. Compared to 10/21/2019 radiographs, it shows minimal further compression. T12 chronic compression is unchanged.  X-ray Impression Fairly stable appearance to L1 compression.  Assessment: ICD-10-CM  1. Closed compression fracture of body of L1 vertebra (CMS-HCC) S32.010A   Plan: The patient has clinical findings of  continued pain and L1 compression fracture.  We discussed the patient's prior MRI findings and a lateral x-ray view of the lumbar spine The patient would like to proceed with L1 kyphoplasty.  We will schedule the patient for L1 kyphoplasty in the near future on 12/17/2019.  Surgical Risks:  The nature of the condition and the proposed procedure has been reviewed in detail with the patient. Surgical versus non-surgical options and prognosis for recovery have been reviewed and the inherent risks and benefits of each have been discussed including the risks of infection, bleeding, injury to nerves/blood vessels/tendons, incomplete relief of symptoms, persisting pain and/or stiffness, loss of function, complex regional pain syndrome, failure of the procedure, as appropriate.  Teeth: Good dentition.  Scribe Attestation: Genene Churn Hoffelt, am acting as scribe for Lauris Poag, MD Reviewed paper H+P No changes noted.

## 2019-12-17 NOTE — Op Note (Signed)
Date December 17, 2019  time 4:58 PM   PATIENT:  Caroline Aguilar   PRE-OPERATIVE DIAGNOSIS:  closed wedge compression fracture of L1   POST-OPERATIVE DIAGNOSIS:  closed wedge compression fracture of L1   PROCEDURE:  Procedure(s): KYPHOPLASTY L1  SURGEON: Laurene Footman, MD   ASSISTANTS: None   ANESTHESIA:   local and MAC   EBL:  No intake/output data recorded.   BLOOD ADMINISTERED:none   DRAINS: none    LOCAL MEDICATIONS USED:  MARCAINE    and XYLOCAINE    SPECIMEN:   L1 vertebral body biopsy   DISPOSITION OF SPECIMEN:  Pathology   COUNTS:  YES   TOURNIQUET:  * No tourniquets in log *   IMPLANTS: Bone cement   DICTATION: .Dragon Dictation  patient was brought to the operating room and after adequate anesthesia was obtained the patient was placed prone.  C arm was brought in in good visualization of the affected level obtained on both AP and lateral projections.  After patient identification and timeout procedures were completed, local anesthetic was infiltrated with 10 cc 1% Xylocaine infiltrated subcutaneously.  This is done the area on the each side of the planned approach.  The back was then prepped and draped in the usual sterile manner and repeat timeout procedure carried out.  A spinal needle was brought down to the pedicle on the right side of  L1 and a 50-50 mix of 1% Xylocaine half percent Sensorcaine with epinephrine total of 20 cc injected on each side.  After allowing this to set a small incision was made and the trocar was advanced into the vertebral body in an extrapedicular fashion.  Biopsy was obtained Drilling was carried out balloon inserted with inflation to  2-1/2 cc on the right and got to the midline so left side was not approached.  When the cement was appropriate consistency 5-1/2 cc were injected on the right  into the vertebral body without extravasation, good fill superior to inferior endplates and from right to left sides along the inferior endplate.   After the cement had set the trochar was removed and permanent C-arm views obtained.  The wound was closed with Dermabond followed by Band-Aid   PLAN OF CARE:  Home after recovery room   PATIENT DISPOSITION:  PACU - hemodynamically stable.

## 2019-12-17 NOTE — Transfer of Care (Signed)
Immediate Anesthesia Transfer of Care Note  Patient: Caroline Aguilar  Procedure(s) Performed: L1 Kyphoplasty (N/A )  Patient Location: PACU  Anesthesia Type:General  Level of Consciousness: awake, alert  and oriented  Airway & Oxygen Therapy: Patient Spontanous Breathing and Patient connected to nasal cannula oxygen  Post-op Assessment: Report given to RN and Post -op Vital signs reviewed and stable  Post vital signs: Reviewed and stable  Last Vitals:  Vitals Value Taken Time  BP    Temp    Pulse    Resp    SpO2      Last Pain:  Vitals:   12/17/19 1528  TempSrc: Temporal  PainSc: 0-No pain         Complications: No complications documented.

## 2019-12-19 LAB — SURGICAL PATHOLOGY

## 2019-12-19 NOTE — Anesthesia Postprocedure Evaluation (Signed)
Anesthesia Post Note  Patient: Clementine P Mackowski  Procedure(s) Performed: L1 Kyphoplasty (N/A )  Patient location during evaluation: PACU Anesthesia Type: General Level of consciousness: awake and alert and oriented Pain management: pain level controlled Vital Signs Assessment: post-procedure vital signs reviewed and stable Respiratory status: spontaneous breathing Cardiovascular status: blood pressure returned to baseline Anesthetic complications: no   No complications documented.   Last Vitals:  Vitals:   12/17/19 1715 12/17/19 1730  BP: (!) 180/68 (!) 195/92  Pulse: (!) 54 (!) 56  Resp: 20 15  Temp:  36.4 C  SpO2: 99% 97%    Last Pain:  Vitals:   12/17/19 1730  TempSrc:   PainSc: 0-No pain                 Dorien Bessent

## 2019-12-31 DIAGNOSIS — S32010A Wedge compression fracture of first lumbar vertebra, initial encounter for closed fracture: Secondary | ICD-10-CM | POA: Diagnosis not present

## 2020-01-07 DIAGNOSIS — M79674 Pain in right toe(s): Secondary | ICD-10-CM | POA: Diagnosis not present

## 2020-01-07 DIAGNOSIS — M79675 Pain in left toe(s): Secondary | ICD-10-CM | POA: Diagnosis not present

## 2020-01-07 DIAGNOSIS — B351 Tinea unguium: Secondary | ICD-10-CM | POA: Diagnosis not present

## 2020-01-13 DIAGNOSIS — Z23 Encounter for immunization: Secondary | ICD-10-CM | POA: Diagnosis not present

## 2020-01-13 DIAGNOSIS — J45902 Unspecified asthma with status asthmaticus: Secondary | ICD-10-CM | POA: Diagnosis not present

## 2020-01-13 DIAGNOSIS — J40 Bronchitis, not specified as acute or chronic: Secondary | ICD-10-CM | POA: Diagnosis not present

## 2020-01-20 DIAGNOSIS — H401132 Primary open-angle glaucoma, bilateral, moderate stage: Secondary | ICD-10-CM | POA: Diagnosis not present

## 2020-01-31 ENCOUNTER — Emergency Department (HOSPITAL_COMMUNITY): Payer: Medicare HMO

## 2020-01-31 ENCOUNTER — Encounter (HOSPITAL_COMMUNITY): Payer: Self-pay

## 2020-01-31 ENCOUNTER — Emergency Department (HOSPITAL_COMMUNITY)
Admission: EM | Admit: 2020-01-31 | Discharge: 2020-01-31 | Disposition: A | Discharge status: Home / Self Care | Payer: Medicare HMO | Attending: Emergency Medicine | Admitting: Emergency Medicine

## 2020-01-31 ENCOUNTER — Other Ambulatory Visit: Payer: Self-pay

## 2020-01-31 DIAGNOSIS — T1490XA Injury, unspecified, initial encounter: Secondary | ICD-10-CM | POA: Diagnosis present

## 2020-01-31 DIAGNOSIS — W19XXXD Unspecified fall, subsequent encounter: Secondary | ICD-10-CM

## 2020-01-31 DIAGNOSIS — S0083XA Contusion of other part of head, initial encounter: Secondary | ICD-10-CM

## 2020-01-31 DIAGNOSIS — W19XXXA Unspecified fall, initial encounter: Secondary | ICD-10-CM | POA: Diagnosis present

## 2020-01-31 DIAGNOSIS — M542 Cervicalgia: Secondary | ICD-10-CM | POA: Diagnosis not present

## 2020-01-31 DIAGNOSIS — Z7901 Long term (current) use of anticoagulants: Secondary | ICD-10-CM | POA: Diagnosis not present

## 2020-01-31 DIAGNOSIS — Y92002 Bathroom of unspecified non-institutional (private) residence single-family (private) house as the place of occurrence of the external cause: Secondary | ICD-10-CM | POA: Insufficient documentation

## 2020-01-31 DIAGNOSIS — I10 Essential (primary) hypertension: Secondary | ICD-10-CM | POA: Diagnosis not present

## 2020-01-31 DIAGNOSIS — W01198A Fall on same level from slipping, tripping and stumbling with subsequent striking against other object, initial encounter: Secondary | ICD-10-CM | POA: Diagnosis not present

## 2020-01-31 DIAGNOSIS — R58 Hemorrhage, not elsewhere classified: Secondary | ICD-10-CM | POA: Diagnosis not present

## 2020-01-31 DIAGNOSIS — S0181XA Laceration without foreign body of other part of head, initial encounter: Secondary | ICD-10-CM | POA: Diagnosis not present

## 2020-01-31 DIAGNOSIS — S0101XA Laceration without foreign body of scalp, initial encounter: Secondary | ICD-10-CM | POA: Diagnosis not present

## 2020-01-31 DIAGNOSIS — S01112A Laceration without foreign body of left eyelid and periocular area, initial encounter: Secondary | ICD-10-CM | POA: Insufficient documentation

## 2020-01-31 DIAGNOSIS — R519 Headache, unspecified: Secondary | ICD-10-CM | POA: Insufficient documentation

## 2020-01-31 DIAGNOSIS — R079 Chest pain, unspecified: Secondary | ICD-10-CM | POA: Diagnosis not present

## 2020-01-31 DIAGNOSIS — S299XXA Unspecified injury of thorax, initial encounter: Secondary | ICD-10-CM | POA: Diagnosis not present

## 2020-01-31 LAB — COMPREHENSIVE METABOLIC PANEL
ALT: 13 U/L (ref 0–44)
AST: 17 U/L (ref 15–41)
Albumin: 3.3 g/dL — ABNORMAL LOW (ref 3.5–5.0)
Alkaline Phosphatase: 91 U/L (ref 38–126)
Anion gap: 10 (ref 5–15)
BUN: 14 mg/dL (ref 8–23)
CO2: 28 mmol/L (ref 22–32)
Calcium: 8.7 mg/dL — ABNORMAL LOW (ref 8.9–10.3)
Chloride: 97 mmol/L — ABNORMAL LOW (ref 98–111)
Creatinine, Ser: 0.96 mg/dL (ref 0.44–1.00)
GFR, Estimated: 51 mL/min — ABNORMAL LOW (ref >60)
Glucose, Bld: 162 mg/dL — ABNORMAL HIGH (ref 70–99)
Potassium: 3.3 mmol/L — ABNORMAL LOW (ref 3.5–5.1)
Sodium: 135 mmol/L (ref 135–145)
Total Bilirubin: 0.5 mg/dL (ref 0.3–1.2)
Total Protein: 6.5 g/dL (ref 6.5–8.1)

## 2020-01-31 LAB — I-STAT CHEM 8, ED
BUN: 14 mg/dL (ref 8–23)
Calcium, Ion: 1.13 mmol/L — ABNORMAL LOW (ref 1.15–1.40)
Chloride: 95 mmol/L — ABNORMAL LOW (ref 98–111)
Creatinine, Ser: 0.9 mg/dL (ref 0.44–1.00)
Glucose, Bld: 159 mg/dL — ABNORMAL HIGH (ref 70–99)
HCT: 41 % (ref 36.0–46.0)
Hemoglobin: 13.9 g/dL (ref 12.0–15.0)
Potassium: 3.3 mmol/L — ABNORMAL LOW (ref 3.5–5.1)
Sodium: 138 mmol/L (ref 135–145)
TCO2: 30 mmol/L (ref 22–32)

## 2020-01-31 LAB — CBC
HCT: 42.4 % (ref 36.0–46.0)
Hemoglobin: 12.9 g/dL (ref 12.0–15.0)
MCH: 29.4 pg (ref 26.0–34.0)
MCHC: 30.4 g/dL (ref 30.0–36.0)
MCV: 96.6 fL (ref 80.0–100.0)
Platelets: 191 10*3/uL (ref 150–400)
RBC: 4.39 MIL/uL (ref 3.87–5.11)
RDW: 12.6 % (ref 11.5–15.5)
WBC: 6.7 10*3/uL (ref 4.0–10.5)
nRBC: 0 % (ref 0.0–0.2)

## 2020-01-31 LAB — APTT: aPTT: 29 seconds (ref 24–36)

## 2020-01-31 LAB — PROTIME-INR
INR: 1 (ref 0.8–1.2)
Prothrombin Time: 13.2 seconds (ref 11.4–15.2)

## 2020-01-31 LAB — LACTIC ACID, PLASMA: Lactic Acid, Venous: 1.6 mmol/L (ref 0.5–1.9)

## 2020-01-31 LAB — TYPE AND SCREEN
ABO/RH(D): O POS
Antibody Screen: NEGATIVE

## 2020-01-31 MED ORDER — FENTANYL CITRATE (PF) 100 MCG/2ML IJ SOLN
50.0000 ug | Freq: Once | INTRAMUSCULAR | Status: AC
  Administered 2020-01-31: 50 ug via INTRAVENOUS

## 2020-01-31 MED ORDER — ACETAMINOPHEN 500 MG PO TABS
1000.0000 mg | ORAL_TABLET | Freq: Once | ORAL | Status: AC
  Administered 2020-01-31: 1000 mg via ORAL
  Filled 2020-01-31: qty 2

## 2020-01-31 MED ORDER — FENTANYL CITRATE (PF) 100 MCG/2ML IJ SOLN
INTRAMUSCULAR | Status: AC
  Filled 2020-01-31: qty 2

## 2020-01-31 MED ORDER — LIDOCAINE-EPINEPHRINE 1 %-1:100000 IJ SOLN
10.0000 mL | Freq: Once | INTRAMUSCULAR | Status: AC
  Administered 2020-01-31: 10 mL via INTRADERMAL
  Filled 2020-01-31: qty 1

## 2020-01-31 MED ORDER — AMLODIPINE BESYLATE 5 MG PO TABS
5.0000 mg | ORAL_TABLET | Freq: Once | ORAL | Status: AC
  Administered 2020-01-31: 5 mg via ORAL
  Filled 2020-01-31: qty 1

## 2020-01-31 NOTE — ED Triage Notes (Signed)
Pt presents as level 2 trauma alert. Pt with FFS 6 days prior and progressive AMS noticed by family since. Pt states they got tripped and fell with head impacting toilet.

## 2020-01-31 NOTE — ED Notes (Signed)
E-signature pad unavailable at time of pt discharge. This RN discussed discharge materials with pt and answered all pt questions. Pt stated understanding of discharge material. ? ?

## 2020-01-31 NOTE — Progress Notes (Signed)
Orthopedic Tech Progress Note Patient Details:  Caroline Aguilar [birth year] [id number] Level 2 not needed. Patient ID: Caroline Aguilar, female   DOB: 15-Apr-1926, 86 y.o.   MRN: [id number]   Chip Boer 01/31/2020, 7:15 PM

## 2020-01-31 NOTE — ED Provider Notes (Signed)
[facility] Provider Note   CSN: [id number] Arrival date & time: 01/31/20  1721     History Chief Complaint  Patient presents with  . Fall    Fall with closed head injury    Caroline Aguilar is a 86 y.o. female with possible past medical history of A. fib, hypertension, heart valve replacement, on Eliquis for unknown medical problem, who presents the ED after a fall.  Per EMS patient had a fall last week in her kitchen and she hit her neck to the back of a chair.  She could not remember the event that led to her fall.  She denies loss of consciousness.  Patient states that after the fall, her left side has been weaker and she tends to lean towards the left side.  Today she had a fall in the bathroom where she leaned toward the left side and fell and hit her head on the floor.  Patient denies loss of consciousness.  Patient is alert and oriented on examination.  She is complaining of neck pain worse on the right side.  She denies other injury of other extremities.  She denies palpitation, chest pain or shortness of breath abdominal pain before her fall.  Patient state that she took her Eliquis this morning but unsure about her hypertension medications.  HPI     History reviewed. No pertinent past medical history.  Patient Active Problem List   Diagnosis Date Noted  . Trauma 01/31/2020  . Fall 01/31/2020  . Contusion of face 01/31/2020     OB History   No obstetric history on file.     No family history on file.  Social History   Tobacco Use  . Smoking status: Not on file  Substance Use Topics  . Alcohol use: Not on file  . Drug use: Not on file    Home Medications Prior to Admission medications   Not on File    Allergies    Penicillins  Review of Systems   Review of Systems  HENT: Negative for nosebleeds.   Respiratory: Negative for chest tightness and shortness of breath.   Cardiovascular: Negative for chest pain and  palpitations.  Gastrointestinal: Negative for abdominal pain.  Musculoskeletal: Positive for gait problem and neck pain. Negative for back pain.  Neurological: Positive for weakness and headaches.    Physical Exam Updated Vital Signs BP (!) 213/80   Pulse 66   Temp 98.3 F (36.8 C) (Oral)   Resp 19   Ht 5\' 5"  (1.651 m)   Wt 56.7 kg   SpO2 98%   BMI 20.80 kg/m   Physical Exam Constitutional:      General: She is not in acute distress.    Appearance: Normal appearance.  HENT:     Head:     Comments: Nontender to palpation. Abrasions noted on the left eyebrow and zygomatic arch.  Bleeding has stopped. No tender to palpation at the nose bridge. No rhinorrhea or epistaxis. Eyes:     General:        Right eye: No discharge.        Left eye: No discharge.     Comments: Pinpoint pupil Minimally reactive to light  Neck:     Comments: Tenderness to palpation of her neck, worse on the right side. Cardiovascular:     Rate and Rhythm: Normal rate and regular rhythm.     Comments: 3/6 systolic murmur at upper sternal border Pulmonary:  Effort: Pulmonary effort is normal. No respiratory distress.     Breath sounds: Normal breath sounds.  Abdominal:     General: There is no distension.     Palpations: Abdomen is soft.     Tenderness: There is no abdominal tenderness.  Musculoskeletal:     Cervical back: Tenderness present.     Comments: Normal range of motion and strength of hips, knees, ankles bilaterally Hip are stable. Normal range of motion and strength of bilateral upper extremities.  Small abrasion noticed at second finger of right hand.  Bleeding has stopped.   Skin:    General: Skin is warm.  Neurological:     Mental Status: She is alert.     Comments: Cranial nerves grossly no deficit  Psychiatric:        Mood and Affect: Mood normal.      ED Results / Procedures / Treatments   Labs (all labs ordered are listed, but only abnormal results are  displayed) Labs Reviewed  COMPREHENSIVE METABOLIC PANEL - Abnormal; Notable for the following components:      Result Value   Potassium 3.3 (*)    Chloride 97 (*)    Glucose, Bld 162 (*)    Calcium 8.7 (*)    Albumin 3.3 (*)    GFR, Estimated 51 (*)    All other components within normal limits  I-STAT CHEM 8, ED - Abnormal; Notable for the following components:   Potassium 3.3 (*)    Chloride 95 (*)    Glucose, Bld 159 (*)    Calcium, Ion 1.13 (*)    All other components within normal limits  CBC  LACTIC ACID, PLASMA  PROTIME-INR  APTT  ETHANOL  URINALYSIS, ROUTINE W REFLEX MICROSCOPIC  TYPE AND SCREEN  ABO/RH    EKG None  Radiology CT HEAD WO CONTRAST  Result Date: 01/31/2020 CLINICAL DATA:  Fall. Left forehead contusion and laceration. Patient is on blood thinners. Neck pain. EXAM: CT HEAD WITHOUT CONTRAST CT MAXILLOFACIAL WITHOUT CONTRAST CT CERVICAL SPINE WITHOUT CONTRAST TECHNIQUE: Multidetector CT imaging of the head, cervical spine, and maxillofacial structures were performed using the standard protocol without intravenous contrast. Multiplanar CT image reconstructions of the cervical spine and maxillofacial structures were also generated. COMPARISON:  Head CT, 10/28/2015.  Cervical CT, 05/19/2018. FINDINGS: CT HEAD FINDINGS Brain: No evidence of acute infarction, hemorrhage, hydrocephalus, extra-axial collection or mass lesion/mass effect. There is ventricular and sulcal enlargement reflecting age related volume loss. Patchy areas of white matter hypoattenuation are also noted consistent with mild to moderate chronic microvascular ischemic change. Vascular: No hyperdense vessel or unexpected calcification. Skull: Normal. Negative for fracture or focal lesion. Other: Left frontal scalp hematoma with soft tissue air consistent with a laceration. CT MAXILLOFACIAL FINDINGS Osseous: No fracture or bone lesion. Normally aligned temporomandibular joints. Orbits: Negative. No  traumatic or inflammatory finding. Sinuses: Clear sinuses, mastoid air cells and middle ear cavities. Soft tissues: Left frontal/forehead scalp contusion/laceration with soft tissue air. No radiopaque foreign body. No other soft tissue abnormality. CT CERVICAL SPINE FINDINGS Alignment: Mild reversal the normal cervical lordosis, apex at C3-C4. No spondylolisthesis/subluxation. Skull base and vertebrae: No acute fracture. No primary bone lesion or focal pathologic process. Soft tissues and spinal canal: No prevertebral fluid or swelling. No visible canal hematoma. Disc levels: Mild loss of disc height at C3-C4-C4-C5. Moderate-to-marked loss of disc height at C5-C6 and C6-C7. There is spondylotic disc bulging and endplate spurring most evident at C5-C6 and C6-C7. Bilateral facet degenerative  changes are noted. There is no convincing disc herniation. Upper chest: No acute findings.  Apical lung scarring. Other: None. IMPRESSION: HEAD CT 1. No acute intracranial abnormalities. 2. No skull fracture. 3. Left forehead scalp hematoma and laceration. No radiopaque foreign body. MAXILLOFACIAL CT 1. No fracture or acute finding. CERVICAL CT 1. No fracture or acute finding. Electronically Signed   By: Lajean Manes M.D.   On: 01/31/2020 18:10   CT Cervical Spine Wo Contrast  Result Date: 01/31/2020 CLINICAL DATA:  Fall. Left forehead contusion and laceration. Patient is on blood thinners. Neck pain. EXAM: CT HEAD WITHOUT CONTRAST CT MAXILLOFACIAL WITHOUT CONTRAST CT CERVICAL SPINE WITHOUT CONTRAST TECHNIQUE: Multidetector CT imaging of the head, cervical spine, and maxillofacial structures were performed using the standard protocol without intravenous contrast. Multiplanar CT image reconstructions of the cervical spine and maxillofacial structures were also generated. COMPARISON:  Head CT, 10/28/2015.  Cervical CT, 05/19/2018. FINDINGS: CT HEAD FINDINGS Brain: No evidence of acute infarction, hemorrhage, hydrocephalus,  extra-axial collection or mass lesion/mass effect. There is ventricular and sulcal enlargement reflecting age related volume loss. Patchy areas of white matter hypoattenuation are also noted consistent with mild to moderate chronic microvascular ischemic change. Vascular: No hyperdense vessel or unexpected calcification. Skull: Normal. Negative for fracture or focal lesion. Other: Left frontal scalp hematoma with soft tissue air consistent with a laceration. CT MAXILLOFACIAL FINDINGS Osseous: No fracture or bone lesion. Normally aligned temporomandibular joints. Orbits: Negative. No traumatic or inflammatory finding. Sinuses: Clear sinuses, mastoid air cells and middle ear cavities. Soft tissues: Left frontal/forehead scalp contusion/laceration with soft tissue air. No radiopaque foreign body. No other soft tissue abnormality. CT CERVICAL SPINE FINDINGS Alignment: Mild reversal the normal cervical lordosis, apex at C3-C4. No spondylolisthesis/subluxation. Skull base and vertebrae: No acute fracture. No primary bone lesion or focal pathologic process. Soft tissues and spinal canal: No prevertebral fluid or swelling. No visible canal hematoma. Disc levels: Mild loss of disc height at C3-C4-C4-C5. Moderate-to-marked loss of disc height at C5-C6 and C6-C7. There is spondylotic disc bulging and endplate spurring most evident at C5-C6 and C6-C7. Bilateral facet degenerative changes are noted. There is no convincing disc herniation. Upper chest: No acute findings.  Apical lung scarring. Other: None. IMPRESSION: HEAD CT 1. No acute intracranial abnormalities. 2. No skull fracture. 3. Left forehead scalp hematoma and laceration. No radiopaque foreign body. MAXILLOFACIAL CT 1. No fracture or acute finding. CERVICAL CT 1. No fracture or acute finding. Electronically Signed   By: Lajean Manes M.D.   On: 01/31/2020 18:10   DG Chest Port 1 View  Result Date: 01/31/2020 CLINICAL DATA:  Level 2 trauma.  Pain. EXAM: PORTABLE  CHEST 1 VIEW COMPARISON:  Sep 02, 2016 FINDINGS: Stable cardiomegaly. The hila and mediastinum are unchanged. No pneumothorax. No nodules or masses. No focal infiltrates. IMPRESSION: No acute abnormalities. Electronically Signed   By: Dorise Bullion III M.D   On: 01/31/2020 17:58   CT Maxillofacial Wo Contrast  Result Date: 01/31/2020 CLINICAL DATA:  Fall. Left forehead contusion and laceration. Patient is on blood thinners. Neck pain. EXAM: CT HEAD WITHOUT CONTRAST CT MAXILLOFACIAL WITHOUT CONTRAST CT CERVICAL SPINE WITHOUT CONTRAST TECHNIQUE: Multidetector CT imaging of the head, cervical spine, and maxillofacial structures were performed using the standard protocol without intravenous contrast. Multiplanar CT image reconstructions of the cervical spine and maxillofacial structures were also generated. COMPARISON:  Head CT, 10/28/2015.  Cervical CT, 05/19/2018. FINDINGS: CT HEAD FINDINGS Brain: No evidence of acute infarction, hemorrhage, hydrocephalus, extra-axial  collection or mass lesion/mass effect. There is ventricular and sulcal enlargement reflecting age related volume loss. Patchy areas of white matter hypoattenuation are also noted consistent with mild to moderate chronic microvascular ischemic change. Vascular: No hyperdense vessel or unexpected calcification. Skull: Normal. Negative for fracture or focal lesion. Other: Left frontal scalp hematoma with soft tissue air consistent with a laceration. CT MAXILLOFACIAL FINDINGS Osseous: No fracture or bone lesion. Normally aligned temporomandibular joints. Orbits: Negative. No traumatic or inflammatory finding. Sinuses: Clear sinuses, mastoid air cells and middle ear cavities. Soft tissues: Left frontal/forehead scalp contusion/laceration with soft tissue air. No radiopaque foreign body. No other soft tissue abnormality. CT CERVICAL SPINE FINDINGS Alignment: Mild reversal the normal cervical lordosis, apex at C3-C4. No spondylolisthesis/subluxation.  Skull base and vertebrae: No acute fracture. No primary bone lesion or focal pathologic process. Soft tissues and spinal canal: No prevertebral fluid or swelling. No visible canal hematoma. Disc levels: Mild loss of disc height at C3-C4-C4-C5. Moderate-to-marked loss of disc height at C5-C6 and C6-C7. There is spondylotic disc bulging and endplate spurring most evident at C5-C6 and C6-C7. Bilateral facet degenerative changes are noted. There is no convincing disc herniation. Upper chest: No acute findings.  Apical lung scarring. Other: None. IMPRESSION: HEAD CT 1. No acute intracranial abnormalities. 2. No skull fracture. 3. Left forehead scalp hematoma and laceration. No radiopaque foreign body. MAXILLOFACIAL CT 1. No fracture or acute finding. CERVICAL CT 1. No fracture or acute finding. Electronically Signed   By: Lajean Manes M.D.   On: 01/31/2020 18:10    Procedures .Marland KitchenLaceration Repair  Date/Time: 01/31/2020 8:24 PM Performed by: Gaylan Gerold, DO Authorized by: Carmin Muskrat, MD   Consent:    Consent obtained:  Verbal   Consent given by:  Patient   Risks discussed:  Infection and pain   Alternatives discussed:  No treatment Anesthesia (see MAR for exact dosages):    Anesthesia method:  Local infiltration   Local anesthetic:  Lidocaine 1% WITH epi Laceration details:    Location:  Face   Face location:  L eyebrow   Length (cm):  5 Repair type:    Repair type:  Simple Pre-procedure details:    Preparation:  Patient was prepped and draped in usual sterile fashion Exploration:    Hemostasis achieved with:  Direct pressure   Wound exploration: wound explored through full range of motion     Wound extent: areolar tissue violated     Contaminated: no   Treatment:    Area cleansed with:  Saline   Amount of cleaning:  Standard   Visualized foreign bodies/material removed: no   Skin repair:    Repair method:  Sutures   Suture size:  5-0   Wound skin closure material used:  Vicryl.   Suture technique:  Simple interrupted   Number of sutures:  3 Approximation:    Approximation:  Close Post-procedure details:    Dressing:  Antibiotic ointment   Patient tolerance of procedure:  Tolerated well, no immediate complications   (including critical care time)  Medications Ordered in ED Medications  fentaNYL (SUBLIMAZE) injection 50 mcg (50 mcg Intravenous Given 01/31/20 1758)  lidocaine-EPINEPHrine (XYLOCAINE W/EPI) 1 %-1:100000 (with pres) injection 10 mL (10 mLs Intradermal Given by Other 01/31/20 1939)  amLODipine (NORVASC) tablet 5 mg (5 mg Oral Given 01/31/20 1938)  acetaminophen (TYLENOL) tablet 1,000 mg (1,000 mg Oral Given 01/31/20 1938)    ED Course  I have reviewed the triage vital signs and the nursing notes.  Pertinent labs & imaging results that were available during my care of the patient were reviewed by me and considered in my medical decision making (see chart for details).  Patient seen and examined.  Facial bruises and 5 cm laceration left eyebrow.  Normal neuro exam.  Normal strength and range of motion of bilateral lower extremity and hips and arms.  Obtain imaging of the head, cervical, maxillofacial and chest x-ray.  Imaging came back negative.  Lab works are also reassuring.  Her blood pressure was in the 200s/100s during examination.  Her son states that her systolic blood pressure usually in the 110s and she is not taking her amlodipine 5 mg.  Her blood pressure is likely due to her pain and trauma.  Patient will be discharged home with scheduled Tylenol for pain regimen.  Home health PT referral for balancing issue.  Follow-up with PCP.  Come back to the ED for worsening pain or subsequent fall.  Patient and her son agree with the plan    MDM Rules/Calculators/A&P                          Patient presents the ED after a fall.  Patient states that she had prior fall last week which she hit her head neck to a chair.  After the fall she is  feeling weaker on her right side, which she contributes to the fall today in the bathroom.  Patient hit her head with a 5 cm laceration on left eyebrow, which was repaired with 3 simple interrupted sutures.  CT head, cervical, maxillofacial were negative for any fracture.  Chest x-ray was also normal.  CBC, CMP, lactic acid were unremarkable.  Patient states that she does not like to take narcotics which can make her confused.  Home pain control regimen will include scheduled Tylenol 1 g 3 times a day.  Also sent a referral for home health PT to help with her balance issue.  Patient does use a walker to ambulate at home.  Also follow-up with her PCP.  Patient and her son agree with the plan  Final Clinical Impression(s) / ED Diagnoses Final diagnoses:  Trauma  Fall, subsequent encounter  Contusion of face, initial encounter    Rx / DC Orders ED Discharge Orders         [place]        01/31/20 1944    Face-to-face encounter (required for Medicare/Medicaid patients)       Comments: I Gaylan Gerold certify that this patient is under my care and that I, or a nurse practitioner or physician's assistant working with me, had a face-to-face encounter that meets the physician face-to-face encounter requirements with this patient on 01/31/2020. The encounter with the patient was in whole, or in part for the following medical condition(s) which is the primary reason for home health care (List medical condition): Balance Issue   01/31/20 Parker, [place], DO 01/31/20 2034    Carmin Muskrat, MD 02/01/20 2316

## 2020-01-31 NOTE — Discharge Instructions (Addendum)
Caroline Aguilar, you came into the ED for a fall.  CT scan of your head, neck, face are negative for any fracture.  Chest x-ray is also normal.  Your blood works are also reassuring.  Please take Tylenol 1 g 3 times a day for 7-day to help with the pain.  Do not take more than 3 g of Tylenol/acetaminophen a day.  I placed 3 stitches on your left eyebrow to close the laceration.  Please follow-up with your primary care physician and wound care for your stitches.  I also send a referral for home health PT to help with your balance issue.  Please come back to the ED for worsening pain or subsequent fall.  Take care

## 2020-02-02 ENCOUNTER — Encounter: Payer: Self-pay | Admitting: Orthopedic Surgery

## 2020-02-08 ENCOUNTER — Emergency Department: Payer: Medicare HMO

## 2020-02-08 ENCOUNTER — Emergency Department
Admission: EM | Admit: 2020-02-08 | Discharge: 2020-02-08 | Disposition: A | Discharge status: Home / Self Care | Payer: Medicare HMO | Attending: Emergency Medicine | Admitting: Emergency Medicine

## 2020-02-08 DIAGNOSIS — I4891 Unspecified atrial fibrillation: Secondary | ICD-10-CM | POA: Diagnosis not present

## 2020-02-08 DIAGNOSIS — I10 Essential (primary) hypertension: Secondary | ICD-10-CM | POA: Diagnosis not present

## 2020-02-08 DIAGNOSIS — Z85038 Personal history of other malignant neoplasm of large intestine: Secondary | ICD-10-CM | POA: Insufficient documentation

## 2020-02-08 DIAGNOSIS — Z87891 Personal history of nicotine dependence: Secondary | ICD-10-CM | POA: Insufficient documentation

## 2020-02-08 DIAGNOSIS — R531 Weakness: Secondary | ICD-10-CM | POA: Insufficient documentation

## 2020-02-08 DIAGNOSIS — Z7901 Long term (current) use of anticoagulants: Secondary | ICD-10-CM | POA: Diagnosis not present

## 2020-02-08 DIAGNOSIS — R2681 Unsteadiness on feet: Secondary | ICD-10-CM | POA: Diagnosis not present

## 2020-02-08 DIAGNOSIS — F419 Anxiety disorder, unspecified: Secondary | ICD-10-CM | POA: Diagnosis not present

## 2020-02-08 DIAGNOSIS — R4182 Altered mental status, unspecified: Secondary | ICD-10-CM | POA: Diagnosis not present

## 2020-02-08 DIAGNOSIS — W19XXXA Unspecified fall, initial encounter: Secondary | ICD-10-CM | POA: Diagnosis not present

## 2020-02-08 LAB — CBC
HCT: 42.7 % (ref 36.0–46.0)
Hemoglobin: 13.7 g/dL (ref 12.0–15.0)
MCH: 30.2 pg (ref 26.0–34.0)
MCHC: 32.1 g/dL (ref 30.0–36.0)
MCV: 94.1 fL (ref 80.0–100.0)
Platelets: 152 10*3/uL (ref 150–400)
RBC: 4.54 MIL/uL (ref 3.87–5.11)
RDW: 12.7 % (ref 11.5–15.5)
WBC: 6.1 10*3/uL (ref 4.0–10.5)
nRBC: 0 % (ref 0.0–0.2)

## 2020-02-08 LAB — COMPREHENSIVE METABOLIC PANEL
ALT: 15 U/L (ref 0–44)
AST: 24 U/L (ref 15–41)
Albumin: 3.5 g/dL (ref 3.5–5.0)
Alkaline Phosphatase: 89 U/L (ref 38–126)
Anion gap: 10 (ref 5–15)
BUN: 19 mg/dL (ref 8–23)
CO2: 27 mmol/L (ref 22–32)
Calcium: 8.7 mg/dL — ABNORMAL LOW (ref 8.9–10.3)
Chloride: 101 mmol/L (ref 98–111)
Creatinine, Ser: 0.66 mg/dL (ref 0.44–1.00)
GFR, Estimated: >60 mL/min (ref >60)
Glucose, Bld: 99 mg/dL (ref 70–99)
Potassium: 4.7 mmol/L (ref 3.5–5.1)
Sodium: 138 mmol/L (ref 135–145)
Total Bilirubin: 1 mg/dL (ref 0.3–1.2)
Total Protein: 7.1 g/dL (ref 6.5–8.1)

## 2020-02-08 LAB — URINALYSIS, COMPLETE (UACMP) WITH MICROSCOPIC
Bilirubin Urine: NEGATIVE
Glucose, UA: NEGATIVE mg/dL
Ketones, ur: NEGATIVE mg/dL
Leukocytes,Ua: NEGATIVE
Nitrite: POSITIVE — AB
Protein, ur: NEGATIVE mg/dL
Specific Gravity, Urine: 1.006 (ref 1.005–1.030)
Squamous Epithelial / HPF: NONE SEEN (ref 0–5)
pH: 7 (ref 5.0–8.0)

## 2020-02-08 LAB — TROPONIN I (HIGH SENSITIVITY): Troponin I (High Sensitivity): 6 ng/L (ref <18)

## 2020-02-08 MED ORDER — CLONIDINE HCL 0.1 MG PO TABS
0.1000 mg | ORAL_TABLET | Freq: Once | ORAL | Status: AC
  Administered 2020-02-08: 0.1 mg via ORAL
  Filled 2020-02-08: qty 1

## 2020-02-08 MED ORDER — SODIUM CHLORIDE 0.9 % IV BOLUS
500.0000 mL | Freq: Once | INTRAVENOUS | Status: AC
  Administered 2020-02-08: 500 mL via INTRAVENOUS

## 2020-02-08 MED ORDER — ACETAMINOPHEN 500 MG PO TABS
1000.0000 mg | ORAL_TABLET | Freq: Once | ORAL | Status: AC
  Administered 2020-02-08: 1000 mg via ORAL
  Filled 2020-02-08: qty 2

## 2020-02-08 NOTE — ED Triage Notes (Signed)
Patient to ED via EMS for altered mental status. Has become more lethargic since falling last Saturday.

## 2020-02-08 NOTE — ED Notes (Signed)
MD stating okay to discharge patient now that urine is resulted.

## 2020-02-08 NOTE — ED Notes (Signed)
Patient to ED for continued lethargy at home. [place] last Saturday and was taken to [facility] due to suspicion of skull fracture. Since that time family has noticed a decrease in mentation. Patient denies CP, shob, or pain of any kind. States she is just tired.

## 2020-02-08 NOTE — ED Notes (Signed)
Patient tolerated pills in apple sauce well. Will continue to monitor blood pressure per MD order.

## 2020-02-08 NOTE — Discharge Instructions (Signed)
As we discussed, please be very slow and purposeful when changing positions to avoid falls.  Please only transfer with the assistance of family members.  Continue to document her blood pressure at home, I would recommend you check it 1-2 times per day when she is feeling normal and has no symptoms.  Document these numbers and bring them with you to see Dr. Sabra Heck on Wednesday.  Follow-up with your PCP on Wednesday to discuss the addition of blood pressure medication back to her regimen, to discuss physical therapy or other home health interventions.  If she develops any significantly worsening symptoms, falls or poorly controlled blood pressure at home, please return to the ED.

## 2020-02-08 NOTE — ED Notes (Signed)
This RN went in to discharge patient, family member stating he was informed that urine needed to be collected prior to discharge. This RN confirmed with MD that urine is needed. Pt has not voided independently into external catheter for sample. MD advising to straight cath for urine.

## 2020-02-08 NOTE — ED Provider Notes (Signed)
[facility] Emergency Department Provider Note  Time seen: 2:27 PM  I have reviewed the triage vital signs and the nursing notes.   HISTORY  Chief Complaint Altered Mental Status   HPI Caroline Aguilar is a 86 y.o. female with a past medical history of gastric reflux, hyperlipidemia, paroxysmal atrial fibrillation on Eliquis presents to the emergency department for altered mental status.  According to EMS report patient comes from home where she lives with family.  Patient had a fall approximately 1 week ago, states she was worked up with a negative CT scan but since the fall she has been more fatigued than normal.  Patient denies any specific complaints besides a mild headache.  Specifically denies any chest pain or abdominal pain.  No nausea vomiting or diarrhea.  No dysuria.   Past Medical History:  Diagnosis Date  . A-fib ([place])   . Angina pectoris ([place])   . Cancer [facility])    Colon cancer  . Falls frequently   . GERD (gastroesophageal reflux disease)   . Hyperlipidemia   . Neuropathy     Patient Active Problem List   Diagnosis Date Noted  . Trauma 01/31/2020  . Fall 01/31/2020  . Contusion of face 01/31/2020  . Stroke ([place]) 06/11/2017  . HLD (hyperlipidemia) 06/11/2017  . GERD (gastroesophageal reflux disease) 06/11/2017  . (HFpEF) heart failure with preserved ejection fraction ([place]) 06/11/2017  . Thigh hematoma 09/02/2016  . Dyspnea 07/15/2015  . Chest pain at rest 07/15/2015  . Community acquired pneumonia 07/02/2015  . Hyponatremia 07/02/2015  . AF (paroxysmal atrial fibrillation) ([place]) 07/02/2015    Past Surgical History:  Procedure Laterality Date  . BONE EXCISION Right 11/14/2019   Procedure: arthroplasty right foot fifth toe;  Surgeon: Sharlotte Alamo, DPM;  Location: [facility] ORS;  Service: Podiatry;  Laterality: Right;  . CARDIAC CATHETERIZATION N/A 07/19/2015   Procedure: Right and Left Heart Cath;  Surgeon: Isaias Cowman, MD;  Location:  [place] CV LAB;  Service: Cardiovascular;  Laterality: N/A;  . CATARACT EXTRACTION Bilateral   . COLON SURGERY    . ESOPHAGOGASTRODUODENOSCOPY N/A 07/08/2015   Procedure: ESOPHAGOGASTRODUODENOSCOPY (EGD);  Surgeon: Hulen Luster, MD;  Location: [facility] ENDOSCOPY;  Service: Endoscopy;  Laterality: N/A;  . FEMUR FRACTURE SURGERY    . FOREARM SURGERY    . HIP SURGERY Bilateral   . KYPHOPLASTY N/A 06/11/2018   Procedure: KYPHOPLASTY L4;  Surgeon: Hessie Knows, MD;  Location: [facility] ORS;  Service: Orthopedics;  Laterality: N/A;  . KYPHOPLASTY N/A 12/17/2019   Procedure: L1 Kyphoplasty;  Surgeon: Hessie Knows, MD;  Location: [facility] ORS;  Service: Orthopedics;  Laterality: N/A;  . VALVE REPLACEMENT  2017   @ [facility]    Prior to Admission medications   Medication Sig Start Date End Date Taking? Authorizing Provider  acetaminophen (TYLENOL) 325 MG tablet Take 650 mg by mouth every 6 (six) hours as needed for moderate pain.     [provider]  amiodarone (PACERONE) 200 MG tablet Take 1 tablet (200 mg total) by mouth daily. 07/20/15   [name], MD  apixaban (ELIQUIS) 2.5 MG TABS tablet Take 2.5 mg by mouth 2 (two) times daily.     [provider]  buPROPion (WELLBUTRIN XL) 150 MG 24 hr tablet Take 150 mg by mouth every morning.  10/20/19   [provider]  Calcium Carbonate-Vitamin D (CALCIUM 600+D) 600-400 MG-UNIT tablet Take 1 tablet by mouth daily.     [provider]  docusate  sodium (COLACE) 100 MG capsule Take 100 mg by mouth daily.     [provider]  gabapentin (NEURONTIN) 100 MG capsule Take 100 mg by mouth at bedtime. 10/20/19   [provider]  latanoprost (XALATAN) 0.005 % ophthalmic solution Place 1 drop into both eyes at bedtime.     [provider]  omeprazole (PRILOSEC) 20 MG capsule Take 20 mg by mouth at bedtime.     [provider]  sertraline (ZOLOFT) 50 MG tablet Take 50 mg by mouth every morning.     [provider]  simvastatin (ZOCOR) 20 MG tablet Take 20 mg by mouth daily at 6 PM.  05/18/18   [provider]  timolol (TIMOPTIC) 0.5 % ophthalmic solution Place 1 drop into both eyes 2 (two) times daily.    [provider]    Allergies  Allergen Reactions  . Penicillins Anaphylaxis and Other (See Comments)    Has patient had a PCN reaction causing immediate rash, facial/tongue/throat swelling, SOB or lightheadedness with hypotension: Yes Has patient had a PCN reaction causing severe rash involving mucus membranes or skin necrosis: No Has patient had a PCN reaction that required hospitalization No Has patient had a PCN reaction occurring within the last 10 years: No If all of the above answers are "NO", then may proceed with Cephalosporin use.  Marland Kitchen Penicillins Anaphylaxis  . Azithromycin Other (See Comments)    Reaction:  Unknown   . Famciclovir Other (See Comments)    Reaction:  Unknown     Family History  Problem Relation Age of Onset  . Cancer Mother   . Heart failure Father     Social History Social History   Tobacco Use  . Smoking status: Former Smoker    Years: 3.00    Types: Cigarettes    Quit date: 11/09/1945    Years since quitting: 74.2  . Smokeless tobacco: Never Used  Vaping Use  . Vaping Use: Never used  Substance Use Topics  . Alcohol use: No  . Drug use: No    Review of Systems Constitutional: Negative for fever. Cardiovascular: Negative for chest pain. Respiratory: Negative for shortness of breath. Gastrointestinal: Negative for abdominal pain, vomiting Genitourinary: Negative for urinary compaints Musculoskeletal: Negative for musculoskeletal complaints Skin: Older bruise to left face Neurological: Mild headache All other ROS negative  ____________________________________________   PHYSICAL EXAM:  VITAL SIGNS: ED Triage Vitals  Enc Vitals Group     BP 02/08/20 1402 (!) 232/96     Pulse Rate 02/08/20 1402 64     Resp  02/08/20 1402 20     Temp 02/08/20 1402 98.1 F (36.7 C)     Temp Source 02/08/20 1402 Oral     SpO2 02/08/20 1402 96 %     Weight 02/08/20 1403 160 lb (72.6 kg)     Height 02/08/20 1403 5\' 5"  (1.651 m)     Head Circumference --      Peak Flow --      Pain Score 02/08/20 1402 0     Pain Loc --      Pain Edu? --      Excl. in [place]? --     Constitutional: Patient is awake and alert.  No acute distress. Eyes: Normal exam ENT      Head: Patient has older appearing ecchymosis to her left forehead with an older appearing abrasion.      Mouth/Throat: Mucous membranes are moist. Cardiovascular: Normal rate, regular rhythm.  Respiratory: Normal respiratory effort without tachypnea nor retractions. Breath sounds are clear Gastrointestinal: Soft and nontender. No distention. Musculoskeletal: Nontender with normal range of motion in all extremities. Neurologic:  Normal speech and language. No gross focal neurologic deficits Skin:  Skin is warm, dry and intact.  Psychiatric: Mood and affect are normal.   ____________________________________________    EKG  EKG viewed and interpreted by myself shows a normal sinus rhythm at 61 bpm with a narrow QRS, normal axis, slight QTC prolongation nonspecific ST changes.  ____________________________________________    RADIOLOGY  CT head negative for acute abnormality.  ____________________________________________   INITIAL IMPRESSION / ASSESSMENT AND PLAN / ED COURSE  Pertinent labs & imaging results that were available during my care of the patient were reviewed by me and considered in my medical decision making (see chart for details).   Patient presents to the emergency department for increased fatigue since a fall last Saturday.  Patient has older appearing bruise to her left forehead.  As the patient is on anticoagulation we will repeat a head CT to ensure no intracranial abnormality such as a subdural.  We will check labs, urinalysis.  We  will continue to closely monitor in the emergency department.  Patient is noted to be quite hypertensive.  We will dose the patient's home medications.  Caroline Aguilar was evaluated in Emergency Department on 02/08/2020 for the symptoms described in the history of present illness. She was evaluated in the context of the global COVID-19 pandemic, which necessitated consideration that the patient might be at risk for infection with the SARS-CoV-2 virus that causes COVID-19. Institutional protocols and algorithms that pertain to the evaluation of patients at risk for COVID-19 are in a state of rapid change based on information released by regulatory bodies including the CDC and federal and state organizations. These policies and algorithms were followed during the patient's care in the ED.  ____________________________________________   FINAL CLINICAL IMPRESSION(S) / ED DIAGNOSES  Weakness   Harvest Dark, MD 02/09/20 1539

## 2020-02-08 NOTE — ED Notes (Signed)
Pt. Was changed at this time with help from NT Jeb.

## 2020-02-12 DIAGNOSIS — I4891 Unspecified atrial fibrillation: Secondary | ICD-10-CM | POA: Diagnosis not present

## 2020-02-12 DIAGNOSIS — R296 Repeated falls: Secondary | ICD-10-CM | POA: Diagnosis not present

## 2020-02-12 DIAGNOSIS — H547 Unspecified visual loss: Secondary | ICD-10-CM | POA: Diagnosis not present

## 2020-02-12 DIAGNOSIS — Z23 Encounter for immunization: Secondary | ICD-10-CM | POA: Diagnosis not present

## 2020-02-17 DIAGNOSIS — I6529 Occlusion and stenosis of unspecified carotid artery: Secondary | ICD-10-CM | POA: Diagnosis not present

## 2020-02-17 DIAGNOSIS — I480 Paroxysmal atrial fibrillation: Secondary | ICD-10-CM | POA: Diagnosis not present

## 2020-02-17 DIAGNOSIS — E538 Deficiency of other specified B group vitamins: Secondary | ICD-10-CM | POA: Diagnosis not present

## 2020-02-17 DIAGNOSIS — G629 Polyneuropathy, unspecified: Secondary | ICD-10-CM | POA: Diagnosis not present

## 2020-02-17 DIAGNOSIS — G500 Trigeminal neuralgia: Secondary | ICD-10-CM | POA: Diagnosis not present

## 2020-02-17 DIAGNOSIS — I509 Heart failure, unspecified: Secondary | ICD-10-CM | POA: Diagnosis not present

## 2020-02-17 DIAGNOSIS — B029 Zoster without complications: Secondary | ICD-10-CM | POA: Diagnosis not present

## 2020-02-17 DIAGNOSIS — E559 Vitamin D deficiency, unspecified: Secondary | ICD-10-CM | POA: Diagnosis not present

## 2020-02-17 DIAGNOSIS — I110 Hypertensive heart disease with heart failure: Secondary | ICD-10-CM | POA: Diagnosis not present

## 2020-02-18 DIAGNOSIS — I480 Paroxysmal atrial fibrillation: Secondary | ICD-10-CM | POA: Diagnosis not present

## 2020-02-18 DIAGNOSIS — I110 Hypertensive heart disease with heart failure: Secondary | ICD-10-CM | POA: Diagnosis not present

## 2020-02-18 DIAGNOSIS — G629 Polyneuropathy, unspecified: Secondary | ICD-10-CM | POA: Diagnosis not present

## 2020-02-18 DIAGNOSIS — E538 Deficiency of other specified B group vitamins: Secondary | ICD-10-CM | POA: Diagnosis not present

## 2020-02-18 DIAGNOSIS — I6529 Occlusion and stenosis of unspecified carotid artery: Secondary | ICD-10-CM | POA: Diagnosis not present

## 2020-02-18 DIAGNOSIS — I509 Heart failure, unspecified: Secondary | ICD-10-CM | POA: Diagnosis not present

## 2020-02-18 DIAGNOSIS — G500 Trigeminal neuralgia: Secondary | ICD-10-CM | POA: Diagnosis not present

## 2020-02-18 DIAGNOSIS — E559 Vitamin D deficiency, unspecified: Secondary | ICD-10-CM | POA: Diagnosis not present

## 2020-02-18 DIAGNOSIS — B029 Zoster without complications: Secondary | ICD-10-CM | POA: Diagnosis not present

## 2020-02-23 DIAGNOSIS — G629 Polyneuropathy, unspecified: Secondary | ICD-10-CM | POA: Diagnosis not present

## 2020-02-23 DIAGNOSIS — E559 Vitamin D deficiency, unspecified: Secondary | ICD-10-CM | POA: Diagnosis not present

## 2020-02-23 DIAGNOSIS — B029 Zoster without complications: Secondary | ICD-10-CM | POA: Diagnosis not present

## 2020-02-23 DIAGNOSIS — I480 Paroxysmal atrial fibrillation: Secondary | ICD-10-CM | POA: Diagnosis not present

## 2020-02-23 DIAGNOSIS — I6529 Occlusion and stenosis of unspecified carotid artery: Secondary | ICD-10-CM | POA: Diagnosis not present

## 2020-02-23 DIAGNOSIS — G500 Trigeminal neuralgia: Secondary | ICD-10-CM | POA: Diagnosis not present

## 2020-02-23 DIAGNOSIS — I110 Hypertensive heart disease with heart failure: Secondary | ICD-10-CM | POA: Diagnosis not present

## 2020-02-23 DIAGNOSIS — E538 Deficiency of other specified B group vitamins: Secondary | ICD-10-CM | POA: Diagnosis not present

## 2020-02-23 DIAGNOSIS — I509 Heart failure, unspecified: Secondary | ICD-10-CM | POA: Diagnosis not present

## 2020-02-24 DIAGNOSIS — G629 Polyneuropathy, unspecified: Secondary | ICD-10-CM | POA: Diagnosis not present

## 2020-02-24 DIAGNOSIS — I6529 Occlusion and stenosis of unspecified carotid artery: Secondary | ICD-10-CM | POA: Diagnosis not present

## 2020-02-24 DIAGNOSIS — G500 Trigeminal neuralgia: Secondary | ICD-10-CM | POA: Diagnosis not present

## 2020-02-24 DIAGNOSIS — I509 Heart failure, unspecified: Secondary | ICD-10-CM | POA: Diagnosis not present

## 2020-02-24 DIAGNOSIS — I110 Hypertensive heart disease with heart failure: Secondary | ICD-10-CM | POA: Diagnosis not present

## 2020-02-24 DIAGNOSIS — E538 Deficiency of other specified B group vitamins: Secondary | ICD-10-CM | POA: Diagnosis not present

## 2020-02-24 DIAGNOSIS — I480 Paroxysmal atrial fibrillation: Secondary | ICD-10-CM | POA: Diagnosis not present

## 2020-02-24 DIAGNOSIS — B029 Zoster without complications: Secondary | ICD-10-CM | POA: Diagnosis not present

## 2020-02-24 DIAGNOSIS — E559 Vitamin D deficiency, unspecified: Secondary | ICD-10-CM | POA: Diagnosis not present

## 2020-02-25 DIAGNOSIS — G500 Trigeminal neuralgia: Secondary | ICD-10-CM | POA: Diagnosis not present

## 2020-02-25 DIAGNOSIS — I6529 Occlusion and stenosis of unspecified carotid artery: Secondary | ICD-10-CM | POA: Diagnosis not present

## 2020-02-25 DIAGNOSIS — B029 Zoster without complications: Secondary | ICD-10-CM | POA: Diagnosis not present

## 2020-02-25 DIAGNOSIS — I480 Paroxysmal atrial fibrillation: Secondary | ICD-10-CM | POA: Diagnosis not present

## 2020-02-25 DIAGNOSIS — E538 Deficiency of other specified B group vitamins: Secondary | ICD-10-CM | POA: Diagnosis not present

## 2020-02-25 DIAGNOSIS — E559 Vitamin D deficiency, unspecified: Secondary | ICD-10-CM | POA: Diagnosis not present

## 2020-02-25 DIAGNOSIS — I509 Heart failure, unspecified: Secondary | ICD-10-CM | POA: Diagnosis not present

## 2020-02-25 DIAGNOSIS — I110 Hypertensive heart disease with heart failure: Secondary | ICD-10-CM | POA: Diagnosis not present

## 2020-02-25 DIAGNOSIS — G629 Polyneuropathy, unspecified: Secondary | ICD-10-CM | POA: Diagnosis not present

## 2020-02-26 DIAGNOSIS — B029 Zoster without complications: Secondary | ICD-10-CM | POA: Diagnosis not present

## 2020-02-26 DIAGNOSIS — I480 Paroxysmal atrial fibrillation: Secondary | ICD-10-CM | POA: Diagnosis not present

## 2020-02-26 DIAGNOSIS — I110 Hypertensive heart disease with heart failure: Secondary | ICD-10-CM | POA: Diagnosis not present

## 2020-02-26 DIAGNOSIS — I6529 Occlusion and stenosis of unspecified carotid artery: Secondary | ICD-10-CM | POA: Diagnosis not present

## 2020-02-26 DIAGNOSIS — I509 Heart failure, unspecified: Secondary | ICD-10-CM | POA: Diagnosis not present

## 2020-02-26 DIAGNOSIS — G500 Trigeminal neuralgia: Secondary | ICD-10-CM | POA: Diagnosis not present

## 2020-02-26 DIAGNOSIS — G629 Polyneuropathy, unspecified: Secondary | ICD-10-CM | POA: Diagnosis not present

## 2020-03-01 DIAGNOSIS — M5412 Radiculopathy, cervical region: Secondary | ICD-10-CM | POA: Diagnosis not present

## 2020-03-02 DIAGNOSIS — G629 Polyneuropathy, unspecified: Secondary | ICD-10-CM | POA: Diagnosis not present

## 2020-03-02 DIAGNOSIS — I110 Hypertensive heart disease with heart failure: Secondary | ICD-10-CM | POA: Diagnosis not present

## 2020-03-02 DIAGNOSIS — E559 Vitamin D deficiency, unspecified: Secondary | ICD-10-CM | POA: Diagnosis not present

## 2020-03-02 DIAGNOSIS — I6529 Occlusion and stenosis of unspecified carotid artery: Secondary | ICD-10-CM | POA: Diagnosis not present

## 2020-03-02 DIAGNOSIS — G500 Trigeminal neuralgia: Secondary | ICD-10-CM | POA: Diagnosis not present

## 2020-03-02 DIAGNOSIS — I480 Paroxysmal atrial fibrillation: Secondary | ICD-10-CM | POA: Diagnosis not present

## 2020-03-02 DIAGNOSIS — B029 Zoster without complications: Secondary | ICD-10-CM | POA: Diagnosis not present

## 2020-03-02 DIAGNOSIS — E538 Deficiency of other specified B group vitamins: Secondary | ICD-10-CM | POA: Diagnosis not present

## 2020-03-02 DIAGNOSIS — I509 Heart failure, unspecified: Secondary | ICD-10-CM | POA: Diagnosis not present

## 2020-03-05 DIAGNOSIS — E538 Deficiency of other specified B group vitamins: Secondary | ICD-10-CM | POA: Diagnosis not present

## 2020-03-05 DIAGNOSIS — G500 Trigeminal neuralgia: Secondary | ICD-10-CM | POA: Diagnosis not present

## 2020-03-05 DIAGNOSIS — I110 Hypertensive heart disease with heart failure: Secondary | ICD-10-CM | POA: Diagnosis not present

## 2020-03-05 DIAGNOSIS — I509 Heart failure, unspecified: Secondary | ICD-10-CM | POA: Diagnosis not present

## 2020-03-05 DIAGNOSIS — E559 Vitamin D deficiency, unspecified: Secondary | ICD-10-CM | POA: Diagnosis not present

## 2020-03-05 DIAGNOSIS — I6529 Occlusion and stenosis of unspecified carotid artery: Secondary | ICD-10-CM | POA: Diagnosis not present

## 2020-03-05 DIAGNOSIS — I480 Paroxysmal atrial fibrillation: Secondary | ICD-10-CM | POA: Diagnosis not present

## 2020-03-05 DIAGNOSIS — B029 Zoster without complications: Secondary | ICD-10-CM | POA: Diagnosis not present

## 2020-03-05 DIAGNOSIS — G629 Polyneuropathy, unspecified: Secondary | ICD-10-CM | POA: Diagnosis not present

## 2020-03-06 DIAGNOSIS — G629 Polyneuropathy, unspecified: Secondary | ICD-10-CM | POA: Diagnosis not present

## 2020-03-06 DIAGNOSIS — I480 Paroxysmal atrial fibrillation: Secondary | ICD-10-CM | POA: Diagnosis not present

## 2020-03-06 DIAGNOSIS — G500 Trigeminal neuralgia: Secondary | ICD-10-CM | POA: Diagnosis not present

## 2020-03-06 DIAGNOSIS — I509 Heart failure, unspecified: Secondary | ICD-10-CM | POA: Diagnosis not present

## 2020-03-06 DIAGNOSIS — B029 Zoster without complications: Secondary | ICD-10-CM | POA: Diagnosis not present

## 2020-03-06 DIAGNOSIS — E538 Deficiency of other specified B group vitamins: Secondary | ICD-10-CM | POA: Diagnosis not present

## 2020-03-06 DIAGNOSIS — I110 Hypertensive heart disease with heart failure: Secondary | ICD-10-CM | POA: Diagnosis not present

## 2020-03-06 DIAGNOSIS — I6529 Occlusion and stenosis of unspecified carotid artery: Secondary | ICD-10-CM | POA: Diagnosis not present

## 2020-03-06 DIAGNOSIS — E559 Vitamin D deficiency, unspecified: Secondary | ICD-10-CM | POA: Diagnosis not present

## 2020-03-08 DIAGNOSIS — B029 Zoster without complications: Secondary | ICD-10-CM | POA: Diagnosis not present

## 2020-03-08 DIAGNOSIS — I480 Paroxysmal atrial fibrillation: Secondary | ICD-10-CM | POA: Diagnosis not present

## 2020-03-08 DIAGNOSIS — E538 Deficiency of other specified B group vitamins: Secondary | ICD-10-CM | POA: Diagnosis not present

## 2020-03-08 DIAGNOSIS — I110 Hypertensive heart disease with heart failure: Secondary | ICD-10-CM | POA: Diagnosis not present

## 2020-03-08 DIAGNOSIS — G629 Polyneuropathy, unspecified: Secondary | ICD-10-CM | POA: Diagnosis not present

## 2020-03-08 DIAGNOSIS — G500 Trigeminal neuralgia: Secondary | ICD-10-CM | POA: Diagnosis not present

## 2020-03-08 DIAGNOSIS — E559 Vitamin D deficiency, unspecified: Secondary | ICD-10-CM | POA: Diagnosis not present

## 2020-03-08 DIAGNOSIS — I509 Heart failure, unspecified: Secondary | ICD-10-CM | POA: Diagnosis not present

## 2020-03-08 DIAGNOSIS — I6529 Occlusion and stenosis of unspecified carotid artery: Secondary | ICD-10-CM | POA: Diagnosis not present

## 2020-03-10 DIAGNOSIS — E559 Vitamin D deficiency, unspecified: Secondary | ICD-10-CM | POA: Diagnosis not present

## 2020-03-10 DIAGNOSIS — I480 Paroxysmal atrial fibrillation: Secondary | ICD-10-CM | POA: Diagnosis not present

## 2020-03-10 DIAGNOSIS — G500 Trigeminal neuralgia: Secondary | ICD-10-CM | POA: Diagnosis not present

## 2020-03-10 DIAGNOSIS — I110 Hypertensive heart disease with heart failure: Secondary | ICD-10-CM | POA: Diagnosis not present

## 2020-03-10 DIAGNOSIS — E538 Deficiency of other specified B group vitamins: Secondary | ICD-10-CM | POA: Diagnosis not present

## 2020-03-10 DIAGNOSIS — B029 Zoster without complications: Secondary | ICD-10-CM | POA: Diagnosis not present

## 2020-03-10 DIAGNOSIS — I509 Heart failure, unspecified: Secondary | ICD-10-CM | POA: Diagnosis not present

## 2020-03-10 DIAGNOSIS — I6529 Occlusion and stenosis of unspecified carotid artery: Secondary | ICD-10-CM | POA: Diagnosis not present

## 2020-03-10 DIAGNOSIS — G629 Polyneuropathy, unspecified: Secondary | ICD-10-CM | POA: Diagnosis not present

## 2020-03-15 DIAGNOSIS — E538 Deficiency of other specified B group vitamins: Secondary | ICD-10-CM | POA: Diagnosis not present

## 2020-03-15 DIAGNOSIS — G500 Trigeminal neuralgia: Secondary | ICD-10-CM | POA: Diagnosis not present

## 2020-03-15 DIAGNOSIS — I6529 Occlusion and stenosis of unspecified carotid artery: Secondary | ICD-10-CM | POA: Diagnosis not present

## 2020-03-15 DIAGNOSIS — I110 Hypertensive heart disease with heart failure: Secondary | ICD-10-CM | POA: Diagnosis not present

## 2020-03-15 DIAGNOSIS — G629 Polyneuropathy, unspecified: Secondary | ICD-10-CM | POA: Diagnosis not present

## 2020-03-15 DIAGNOSIS — B029 Zoster without complications: Secondary | ICD-10-CM | POA: Diagnosis not present

## 2020-03-15 DIAGNOSIS — I509 Heart failure, unspecified: Secondary | ICD-10-CM | POA: Diagnosis not present

## 2020-03-15 DIAGNOSIS — I480 Paroxysmal atrial fibrillation: Secondary | ICD-10-CM | POA: Diagnosis not present

## 2020-03-15 DIAGNOSIS — E559 Vitamin D deficiency, unspecified: Secondary | ICD-10-CM | POA: Diagnosis not present

## 2020-03-16 DIAGNOSIS — I6529 Occlusion and stenosis of unspecified carotid artery: Secondary | ICD-10-CM | POA: Diagnosis not present

## 2020-03-16 DIAGNOSIS — B029 Zoster without complications: Secondary | ICD-10-CM | POA: Diagnosis not present

## 2020-03-16 DIAGNOSIS — E538 Deficiency of other specified B group vitamins: Secondary | ICD-10-CM | POA: Diagnosis not present

## 2020-03-16 DIAGNOSIS — I509 Heart failure, unspecified: Secondary | ICD-10-CM | POA: Diagnosis not present

## 2020-03-16 DIAGNOSIS — G629 Polyneuropathy, unspecified: Secondary | ICD-10-CM | POA: Diagnosis not present

## 2020-03-16 DIAGNOSIS — E559 Vitamin D deficiency, unspecified: Secondary | ICD-10-CM | POA: Diagnosis not present

## 2020-03-16 DIAGNOSIS — G500 Trigeminal neuralgia: Secondary | ICD-10-CM | POA: Diagnosis not present

## 2020-03-16 DIAGNOSIS — I480 Paroxysmal atrial fibrillation: Secondary | ICD-10-CM | POA: Diagnosis not present

## 2020-03-16 DIAGNOSIS — I110 Hypertensive heart disease with heart failure: Secondary | ICD-10-CM | POA: Diagnosis not present

## 2020-03-22 DIAGNOSIS — G629 Polyneuropathy, unspecified: Secondary | ICD-10-CM | POA: Diagnosis not present

## 2020-03-22 DIAGNOSIS — E559 Vitamin D deficiency, unspecified: Secondary | ICD-10-CM | POA: Diagnosis not present

## 2020-03-22 DIAGNOSIS — B029 Zoster without complications: Secondary | ICD-10-CM | POA: Diagnosis not present

## 2020-03-22 DIAGNOSIS — I6529 Occlusion and stenosis of unspecified carotid artery: Secondary | ICD-10-CM | POA: Diagnosis not present

## 2020-03-22 DIAGNOSIS — I110 Hypertensive heart disease with heart failure: Secondary | ICD-10-CM | POA: Diagnosis not present

## 2020-03-22 DIAGNOSIS — G500 Trigeminal neuralgia: Secondary | ICD-10-CM | POA: Diagnosis not present

## 2020-03-22 DIAGNOSIS — I480 Paroxysmal atrial fibrillation: Secondary | ICD-10-CM | POA: Diagnosis not present

## 2020-03-22 DIAGNOSIS — E538 Deficiency of other specified B group vitamins: Secondary | ICD-10-CM | POA: Diagnosis not present

## 2020-03-22 DIAGNOSIS — I509 Heart failure, unspecified: Secondary | ICD-10-CM | POA: Diagnosis not present

## 2020-03-23 DIAGNOSIS — B029 Zoster without complications: Secondary | ICD-10-CM | POA: Diagnosis not present

## 2020-03-23 DIAGNOSIS — G629 Polyneuropathy, unspecified: Secondary | ICD-10-CM | POA: Diagnosis not present

## 2020-03-23 DIAGNOSIS — I509 Heart failure, unspecified: Secondary | ICD-10-CM | POA: Diagnosis not present

## 2020-03-23 DIAGNOSIS — I480 Paroxysmal atrial fibrillation: Secondary | ICD-10-CM | POA: Diagnosis not present

## 2020-03-23 DIAGNOSIS — I110 Hypertensive heart disease with heart failure: Secondary | ICD-10-CM | POA: Diagnosis not present

## 2020-03-23 DIAGNOSIS — E559 Vitamin D deficiency, unspecified: Secondary | ICD-10-CM | POA: Diagnosis not present

## 2020-03-23 DIAGNOSIS — G500 Trigeminal neuralgia: Secondary | ICD-10-CM | POA: Diagnosis not present

## 2020-03-23 DIAGNOSIS — E538 Deficiency of other specified B group vitamins: Secondary | ICD-10-CM | POA: Diagnosis not present

## 2020-03-23 DIAGNOSIS — I6529 Occlusion and stenosis of unspecified carotid artery: Secondary | ICD-10-CM | POA: Diagnosis not present

## 2020-03-29 DIAGNOSIS — I110 Hypertensive heart disease with heart failure: Secondary | ICD-10-CM | POA: Diagnosis not present

## 2020-03-29 DIAGNOSIS — I6529 Occlusion and stenosis of unspecified carotid artery: Secondary | ICD-10-CM | POA: Diagnosis not present

## 2020-03-29 DIAGNOSIS — G500 Trigeminal neuralgia: Secondary | ICD-10-CM | POA: Diagnosis not present

## 2020-03-29 DIAGNOSIS — I480 Paroxysmal atrial fibrillation: Secondary | ICD-10-CM | POA: Diagnosis not present

## 2020-03-29 DIAGNOSIS — E559 Vitamin D deficiency, unspecified: Secondary | ICD-10-CM | POA: Diagnosis not present

## 2020-03-29 DIAGNOSIS — I509 Heart failure, unspecified: Secondary | ICD-10-CM | POA: Diagnosis not present

## 2020-03-29 DIAGNOSIS — E538 Deficiency of other specified B group vitamins: Secondary | ICD-10-CM | POA: Diagnosis not present

## 2020-03-29 DIAGNOSIS — G629 Polyneuropathy, unspecified: Secondary | ICD-10-CM | POA: Diagnosis not present

## 2020-03-29 DIAGNOSIS — B029 Zoster without complications: Secondary | ICD-10-CM | POA: Diagnosis not present

## 2020-04-03 DIAGNOSIS — E559 Vitamin D deficiency, unspecified: Secondary | ICD-10-CM | POA: Diagnosis not present

## 2020-04-03 DIAGNOSIS — I6529 Occlusion and stenosis of unspecified carotid artery: Secondary | ICD-10-CM | POA: Diagnosis not present

## 2020-04-03 DIAGNOSIS — G629 Polyneuropathy, unspecified: Secondary | ICD-10-CM | POA: Diagnosis not present

## 2020-04-03 DIAGNOSIS — G500 Trigeminal neuralgia: Secondary | ICD-10-CM | POA: Diagnosis not present

## 2020-04-03 DIAGNOSIS — E538 Deficiency of other specified B group vitamins: Secondary | ICD-10-CM | POA: Diagnosis not present

## 2020-04-03 DIAGNOSIS — B029 Zoster without complications: Secondary | ICD-10-CM | POA: Diagnosis not present

## 2020-04-03 DIAGNOSIS — I110 Hypertensive heart disease with heart failure: Secondary | ICD-10-CM | POA: Diagnosis not present

## 2020-04-03 DIAGNOSIS — I480 Paroxysmal atrial fibrillation: Secondary | ICD-10-CM | POA: Diagnosis not present

## 2020-04-03 DIAGNOSIS — I509 Heart failure, unspecified: Secondary | ICD-10-CM | POA: Diagnosis not present

## 2020-04-07 DIAGNOSIS — B029 Zoster without complications: Secondary | ICD-10-CM | POA: Diagnosis not present

## 2020-04-07 DIAGNOSIS — E538 Deficiency of other specified B group vitamins: Secondary | ICD-10-CM | POA: Diagnosis not present

## 2020-04-07 DIAGNOSIS — E559 Vitamin D deficiency, unspecified: Secondary | ICD-10-CM | POA: Diagnosis not present

## 2020-04-07 DIAGNOSIS — I110 Hypertensive heart disease with heart failure: Secondary | ICD-10-CM | POA: Diagnosis not present

## 2020-04-07 DIAGNOSIS — I480 Paroxysmal atrial fibrillation: Secondary | ICD-10-CM | POA: Diagnosis not present

## 2020-04-07 DIAGNOSIS — G629 Polyneuropathy, unspecified: Secondary | ICD-10-CM | POA: Diagnosis not present

## 2020-04-07 DIAGNOSIS — I509 Heart failure, unspecified: Secondary | ICD-10-CM | POA: Diagnosis not present

## 2020-04-07 DIAGNOSIS — G500 Trigeminal neuralgia: Secondary | ICD-10-CM | POA: Diagnosis not present

## 2020-04-07 DIAGNOSIS — I6529 Occlusion and stenosis of unspecified carotid artery: Secondary | ICD-10-CM | POA: Diagnosis not present

## 2020-04-14 DIAGNOSIS — I480 Paroxysmal atrial fibrillation: Secondary | ICD-10-CM | POA: Diagnosis not present

## 2020-04-14 DIAGNOSIS — E559 Vitamin D deficiency, unspecified: Secondary | ICD-10-CM | POA: Diagnosis not present

## 2020-04-14 DIAGNOSIS — G500 Trigeminal neuralgia: Secondary | ICD-10-CM | POA: Diagnosis not present

## 2020-04-14 DIAGNOSIS — I509 Heart failure, unspecified: Secondary | ICD-10-CM | POA: Diagnosis not present

## 2020-04-14 DIAGNOSIS — I6529 Occlusion and stenosis of unspecified carotid artery: Secondary | ICD-10-CM | POA: Diagnosis not present

## 2020-04-14 DIAGNOSIS — B029 Zoster without complications: Secondary | ICD-10-CM | POA: Diagnosis not present

## 2020-04-14 DIAGNOSIS — G629 Polyneuropathy, unspecified: Secondary | ICD-10-CM | POA: Diagnosis not present

## 2020-04-14 DIAGNOSIS — I110 Hypertensive heart disease with heart failure: Secondary | ICD-10-CM | POA: Diagnosis not present

## 2020-04-14 DIAGNOSIS — E538 Deficiency of other specified B group vitamins: Secondary | ICD-10-CM | POA: Diagnosis not present

## 2020-05-10 DIAGNOSIS — R296 Repeated falls: Secondary | ICD-10-CM | POA: Diagnosis not present

## 2020-05-10 DIAGNOSIS — M47816 Spondylosis without myelopathy or radiculopathy, lumbar region: Secondary | ICD-10-CM | POA: Diagnosis not present

## 2020-05-10 DIAGNOSIS — Z8616 Personal history of COVID-19: Secondary | ICD-10-CM | POA: Diagnosis not present

## 2020-05-10 DIAGNOSIS — M545 Low back pain, unspecified: Secondary | ICD-10-CM | POA: Diagnosis not present

## 2020-05-10 DIAGNOSIS — R053 Chronic cough: Secondary | ICD-10-CM | POA: Diagnosis not present

## 2020-05-10 DIAGNOSIS — R531 Weakness: Secondary | ICD-10-CM | POA: Diagnosis not present

## 2020-05-10 DIAGNOSIS — I517 Cardiomegaly: Secondary | ICD-10-CM | POA: Diagnosis not present

## 2020-05-20 DIAGNOSIS — I5030 Unspecified diastolic (congestive) heart failure: Secondary | ICD-10-CM | POA: Diagnosis not present

## 2020-05-20 DIAGNOSIS — E538 Deficiency of other specified B group vitamins: Secondary | ICD-10-CM | POA: Diagnosis not present

## 2020-05-20 DIAGNOSIS — Z0000 Encounter for general adult medical examination without abnormal findings: Secondary | ICD-10-CM | POA: Diagnosis not present

## 2020-05-20 DIAGNOSIS — I480 Paroxysmal atrial fibrillation: Secondary | ICD-10-CM | POA: Diagnosis not present

## 2020-05-20 DIAGNOSIS — I110 Hypertensive heart disease with heart failure: Secondary | ICD-10-CM | POA: Diagnosis not present

## 2020-05-20 DIAGNOSIS — G629 Polyneuropathy, unspecified: Secondary | ICD-10-CM | POA: Diagnosis not present

## 2020-05-26 DIAGNOSIS — I69351 Hemiplegia and hemiparesis following cerebral infarction affecting right dominant side: Secondary | ICD-10-CM | POA: Diagnosis not present

## 2020-05-26 DIAGNOSIS — M1990 Unspecified osteoarthritis, unspecified site: Secondary | ICD-10-CM | POA: Diagnosis not present

## 2020-05-26 DIAGNOSIS — I6521 Occlusion and stenosis of right carotid artery: Secondary | ICD-10-CM | POA: Diagnosis not present

## 2020-05-26 DIAGNOSIS — I2510 Atherosclerotic heart disease of native coronary artery without angina pectoris: Secondary | ICD-10-CM | POA: Diagnosis not present

## 2020-05-26 DIAGNOSIS — I110 Hypertensive heart disease with heart failure: Secondary | ICD-10-CM | POA: Diagnosis not present

## 2020-05-26 DIAGNOSIS — I5032 Chronic diastolic (congestive) heart failure: Secondary | ICD-10-CM | POA: Diagnosis not present

## 2020-05-26 DIAGNOSIS — G629 Polyneuropathy, unspecified: Secondary | ICD-10-CM | POA: Diagnosis not present

## 2020-05-26 DIAGNOSIS — G500 Trigeminal neuralgia: Secondary | ICD-10-CM | POA: Diagnosis not present

## 2020-05-26 DIAGNOSIS — I480 Paroxysmal atrial fibrillation: Secondary | ICD-10-CM | POA: Diagnosis not present

## 2020-05-28 DIAGNOSIS — I69351 Hemiplegia and hemiparesis following cerebral infarction affecting right dominant side: Secondary | ICD-10-CM | POA: Diagnosis not present

## 2020-05-28 DIAGNOSIS — M1990 Unspecified osteoarthritis, unspecified site: Secondary | ICD-10-CM | POA: Diagnosis not present

## 2020-05-28 DIAGNOSIS — I2510 Atherosclerotic heart disease of native coronary artery without angina pectoris: Secondary | ICD-10-CM | POA: Diagnosis not present

## 2020-05-28 DIAGNOSIS — G629 Polyneuropathy, unspecified: Secondary | ICD-10-CM | POA: Diagnosis not present

## 2020-05-28 DIAGNOSIS — I6521 Occlusion and stenosis of right carotid artery: Secondary | ICD-10-CM | POA: Diagnosis not present

## 2020-05-28 DIAGNOSIS — G500 Trigeminal neuralgia: Secondary | ICD-10-CM | POA: Diagnosis not present

## 2020-05-28 DIAGNOSIS — I5032 Chronic diastolic (congestive) heart failure: Secondary | ICD-10-CM | POA: Diagnosis not present

## 2020-05-28 DIAGNOSIS — I480 Paroxysmal atrial fibrillation: Secondary | ICD-10-CM | POA: Diagnosis not present

## 2020-05-28 DIAGNOSIS — I110 Hypertensive heart disease with heart failure: Secondary | ICD-10-CM | POA: Diagnosis not present

## 2020-05-31 DIAGNOSIS — I5032 Chronic diastolic (congestive) heart failure: Secondary | ICD-10-CM | POA: Diagnosis not present

## 2020-05-31 DIAGNOSIS — I6521 Occlusion and stenosis of right carotid artery: Secondary | ICD-10-CM | POA: Diagnosis not present

## 2020-05-31 DIAGNOSIS — I69351 Hemiplegia and hemiparesis following cerebral infarction affecting right dominant side: Secondary | ICD-10-CM | POA: Diagnosis not present

## 2020-05-31 DIAGNOSIS — M1990 Unspecified osteoarthritis, unspecified site: Secondary | ICD-10-CM | POA: Diagnosis not present

## 2020-05-31 DIAGNOSIS — I2510 Atherosclerotic heart disease of native coronary artery without angina pectoris: Secondary | ICD-10-CM | POA: Diagnosis not present

## 2020-05-31 DIAGNOSIS — I110 Hypertensive heart disease with heart failure: Secondary | ICD-10-CM | POA: Diagnosis not present

## 2020-05-31 DIAGNOSIS — G500 Trigeminal neuralgia: Secondary | ICD-10-CM | POA: Diagnosis not present

## 2020-05-31 DIAGNOSIS — I480 Paroxysmal atrial fibrillation: Secondary | ICD-10-CM | POA: Diagnosis not present

## 2020-05-31 DIAGNOSIS — G629 Polyneuropathy, unspecified: Secondary | ICD-10-CM | POA: Diagnosis not present

## 2020-06-01 DIAGNOSIS — I6521 Occlusion and stenosis of right carotid artery: Secondary | ICD-10-CM | POA: Diagnosis not present

## 2020-06-01 DIAGNOSIS — I480 Paroxysmal atrial fibrillation: Secondary | ICD-10-CM | POA: Diagnosis not present

## 2020-06-01 DIAGNOSIS — I69351 Hemiplegia and hemiparesis following cerebral infarction affecting right dominant side: Secondary | ICD-10-CM | POA: Diagnosis not present

## 2020-06-01 DIAGNOSIS — I2510 Atherosclerotic heart disease of native coronary artery without angina pectoris: Secondary | ICD-10-CM | POA: Diagnosis not present

## 2020-06-01 DIAGNOSIS — G500 Trigeminal neuralgia: Secondary | ICD-10-CM | POA: Diagnosis not present

## 2020-06-01 DIAGNOSIS — G629 Polyneuropathy, unspecified: Secondary | ICD-10-CM | POA: Diagnosis not present

## 2020-06-01 DIAGNOSIS — I5032 Chronic diastolic (congestive) heart failure: Secondary | ICD-10-CM | POA: Diagnosis not present

## 2020-06-01 DIAGNOSIS — I110 Hypertensive heart disease with heart failure: Secondary | ICD-10-CM | POA: Diagnosis not present

## 2020-06-01 DIAGNOSIS — M1990 Unspecified osteoarthritis, unspecified site: Secondary | ICD-10-CM | POA: Diagnosis not present

## 2020-06-03 DIAGNOSIS — I110 Hypertensive heart disease with heart failure: Secondary | ICD-10-CM | POA: Diagnosis not present

## 2020-06-03 DIAGNOSIS — M1990 Unspecified osteoarthritis, unspecified site: Secondary | ICD-10-CM | POA: Diagnosis not present

## 2020-06-03 DIAGNOSIS — I5032 Chronic diastolic (congestive) heart failure: Secondary | ICD-10-CM | POA: Diagnosis not present

## 2020-06-03 DIAGNOSIS — I480 Paroxysmal atrial fibrillation: Secondary | ICD-10-CM | POA: Diagnosis not present

## 2020-06-03 DIAGNOSIS — I6521 Occlusion and stenosis of right carotid artery: Secondary | ICD-10-CM | POA: Diagnosis not present

## 2020-06-03 DIAGNOSIS — G500 Trigeminal neuralgia: Secondary | ICD-10-CM | POA: Diagnosis not present

## 2020-06-03 DIAGNOSIS — I2510 Atherosclerotic heart disease of native coronary artery without angina pectoris: Secondary | ICD-10-CM | POA: Diagnosis not present

## 2020-06-03 DIAGNOSIS — I69351 Hemiplegia and hemiparesis following cerebral infarction affecting right dominant side: Secondary | ICD-10-CM | POA: Diagnosis not present

## 2020-06-03 DIAGNOSIS — G629 Polyneuropathy, unspecified: Secondary | ICD-10-CM | POA: Diagnosis not present

## 2020-06-05 DIAGNOSIS — I69351 Hemiplegia and hemiparesis following cerebral infarction affecting right dominant side: Secondary | ICD-10-CM | POA: Diagnosis not present

## 2020-06-05 DIAGNOSIS — G500 Trigeminal neuralgia: Secondary | ICD-10-CM | POA: Diagnosis not present

## 2020-06-05 DIAGNOSIS — I5032 Chronic diastolic (congestive) heart failure: Secondary | ICD-10-CM | POA: Diagnosis not present

## 2020-06-05 DIAGNOSIS — I2510 Atherosclerotic heart disease of native coronary artery without angina pectoris: Secondary | ICD-10-CM | POA: Diagnosis not present

## 2020-06-05 DIAGNOSIS — G629 Polyneuropathy, unspecified: Secondary | ICD-10-CM | POA: Diagnosis not present

## 2020-06-05 DIAGNOSIS — I110 Hypertensive heart disease with heart failure: Secondary | ICD-10-CM | POA: Diagnosis not present

## 2020-06-05 DIAGNOSIS — M1990 Unspecified osteoarthritis, unspecified site: Secondary | ICD-10-CM | POA: Diagnosis not present

## 2020-06-05 DIAGNOSIS — I480 Paroxysmal atrial fibrillation: Secondary | ICD-10-CM | POA: Diagnosis not present

## 2020-06-05 DIAGNOSIS — I6521 Occlusion and stenosis of right carotid artery: Secondary | ICD-10-CM | POA: Diagnosis not present

## 2020-06-07 DIAGNOSIS — I5032 Chronic diastolic (congestive) heart failure: Secondary | ICD-10-CM | POA: Diagnosis not present

## 2020-06-07 DIAGNOSIS — I2510 Atherosclerotic heart disease of native coronary artery without angina pectoris: Secondary | ICD-10-CM | POA: Diagnosis not present

## 2020-06-07 DIAGNOSIS — I480 Paroxysmal atrial fibrillation: Secondary | ICD-10-CM | POA: Diagnosis not present

## 2020-06-07 DIAGNOSIS — G629 Polyneuropathy, unspecified: Secondary | ICD-10-CM | POA: Diagnosis not present

## 2020-06-07 DIAGNOSIS — I110 Hypertensive heart disease with heart failure: Secondary | ICD-10-CM | POA: Diagnosis not present

## 2020-06-07 DIAGNOSIS — I69351 Hemiplegia and hemiparesis following cerebral infarction affecting right dominant side: Secondary | ICD-10-CM | POA: Diagnosis not present

## 2020-06-07 DIAGNOSIS — M1990 Unspecified osteoarthritis, unspecified site: Secondary | ICD-10-CM | POA: Diagnosis not present

## 2020-06-08 DIAGNOSIS — I2510 Atherosclerotic heart disease of native coronary artery without angina pectoris: Secondary | ICD-10-CM | POA: Diagnosis not present

## 2020-06-08 DIAGNOSIS — I6521 Occlusion and stenosis of right carotid artery: Secondary | ICD-10-CM | POA: Diagnosis not present

## 2020-06-08 DIAGNOSIS — I110 Hypertensive heart disease with heart failure: Secondary | ICD-10-CM | POA: Diagnosis not present

## 2020-06-08 DIAGNOSIS — M1990 Unspecified osteoarthritis, unspecified site: Secondary | ICD-10-CM | POA: Diagnosis not present

## 2020-06-08 DIAGNOSIS — G500 Trigeminal neuralgia: Secondary | ICD-10-CM | POA: Diagnosis not present

## 2020-06-08 DIAGNOSIS — G629 Polyneuropathy, unspecified: Secondary | ICD-10-CM | POA: Diagnosis not present

## 2020-06-08 DIAGNOSIS — I480 Paroxysmal atrial fibrillation: Secondary | ICD-10-CM | POA: Diagnosis not present

## 2020-06-08 DIAGNOSIS — I5032 Chronic diastolic (congestive) heart failure: Secondary | ICD-10-CM | POA: Diagnosis not present

## 2020-06-08 DIAGNOSIS — I69351 Hemiplegia and hemiparesis following cerebral infarction affecting right dominant side: Secondary | ICD-10-CM | POA: Diagnosis not present

## 2020-06-09 DIAGNOSIS — G629 Polyneuropathy, unspecified: Secondary | ICD-10-CM | POA: Diagnosis not present

## 2020-06-09 DIAGNOSIS — I5032 Chronic diastolic (congestive) heart failure: Secondary | ICD-10-CM | POA: Diagnosis not present

## 2020-06-09 DIAGNOSIS — I110 Hypertensive heart disease with heart failure: Secondary | ICD-10-CM | POA: Diagnosis not present

## 2020-06-09 DIAGNOSIS — I480 Paroxysmal atrial fibrillation: Secondary | ICD-10-CM | POA: Diagnosis not present

## 2020-06-09 DIAGNOSIS — I2510 Atherosclerotic heart disease of native coronary artery without angina pectoris: Secondary | ICD-10-CM | POA: Diagnosis not present

## 2020-06-09 DIAGNOSIS — M1990 Unspecified osteoarthritis, unspecified site: Secondary | ICD-10-CM | POA: Diagnosis not present

## 2020-06-09 DIAGNOSIS — I69351 Hemiplegia and hemiparesis following cerebral infarction affecting right dominant side: Secondary | ICD-10-CM | POA: Diagnosis not present

## 2020-06-09 DIAGNOSIS — G500 Trigeminal neuralgia: Secondary | ICD-10-CM | POA: Diagnosis not present

## 2020-06-09 DIAGNOSIS — I6521 Occlusion and stenosis of right carotid artery: Secondary | ICD-10-CM | POA: Diagnosis not present

## 2020-06-10 DIAGNOSIS — I6521 Occlusion and stenosis of right carotid artery: Secondary | ICD-10-CM | POA: Diagnosis not present

## 2020-06-10 DIAGNOSIS — G500 Trigeminal neuralgia: Secondary | ICD-10-CM | POA: Diagnosis not present

## 2020-06-10 DIAGNOSIS — I110 Hypertensive heart disease with heart failure: Secondary | ICD-10-CM | POA: Diagnosis not present

## 2020-06-10 DIAGNOSIS — I2510 Atherosclerotic heart disease of native coronary artery without angina pectoris: Secondary | ICD-10-CM | POA: Diagnosis not present

## 2020-06-10 DIAGNOSIS — G629 Polyneuropathy, unspecified: Secondary | ICD-10-CM | POA: Diagnosis not present

## 2020-06-10 DIAGNOSIS — M1990 Unspecified osteoarthritis, unspecified site: Secondary | ICD-10-CM | POA: Diagnosis not present

## 2020-06-10 DIAGNOSIS — I480 Paroxysmal atrial fibrillation: Secondary | ICD-10-CM | POA: Diagnosis not present

## 2020-06-10 DIAGNOSIS — I69351 Hemiplegia and hemiparesis following cerebral infarction affecting right dominant side: Secondary | ICD-10-CM | POA: Diagnosis not present

## 2020-06-10 DIAGNOSIS — I5032 Chronic diastolic (congestive) heart failure: Secondary | ICD-10-CM | POA: Diagnosis not present

## 2020-06-15 DIAGNOSIS — I2510 Atherosclerotic heart disease of native coronary artery without angina pectoris: Secondary | ICD-10-CM | POA: Diagnosis not present

## 2020-06-15 DIAGNOSIS — I5032 Chronic diastolic (congestive) heart failure: Secondary | ICD-10-CM | POA: Diagnosis not present

## 2020-06-15 DIAGNOSIS — G500 Trigeminal neuralgia: Secondary | ICD-10-CM | POA: Diagnosis not present

## 2020-06-15 DIAGNOSIS — G629 Polyneuropathy, unspecified: Secondary | ICD-10-CM | POA: Diagnosis not present

## 2020-06-15 DIAGNOSIS — I110 Hypertensive heart disease with heart failure: Secondary | ICD-10-CM | POA: Diagnosis not present

## 2020-06-15 DIAGNOSIS — M1990 Unspecified osteoarthritis, unspecified site: Secondary | ICD-10-CM | POA: Diagnosis not present

## 2020-06-15 DIAGNOSIS — I69351 Hemiplegia and hemiparesis following cerebral infarction affecting right dominant side: Secondary | ICD-10-CM | POA: Diagnosis not present

## 2020-06-15 DIAGNOSIS — I6521 Occlusion and stenosis of right carotid artery: Secondary | ICD-10-CM | POA: Diagnosis not present

## 2020-06-15 DIAGNOSIS — I480 Paroxysmal atrial fibrillation: Secondary | ICD-10-CM | POA: Diagnosis not present

## 2020-06-17 DIAGNOSIS — I69351 Hemiplegia and hemiparesis following cerebral infarction affecting right dominant side: Secondary | ICD-10-CM | POA: Diagnosis not present

## 2020-06-17 DIAGNOSIS — M1990 Unspecified osteoarthritis, unspecified site: Secondary | ICD-10-CM | POA: Diagnosis not present

## 2020-06-17 DIAGNOSIS — I110 Hypertensive heart disease with heart failure: Secondary | ICD-10-CM | POA: Diagnosis not present

## 2020-06-17 DIAGNOSIS — G629 Polyneuropathy, unspecified: Secondary | ICD-10-CM | POA: Diagnosis not present

## 2020-06-17 DIAGNOSIS — I2510 Atherosclerotic heart disease of native coronary artery without angina pectoris: Secondary | ICD-10-CM | POA: Diagnosis not present

## 2020-06-17 DIAGNOSIS — I6521 Occlusion and stenosis of right carotid artery: Secondary | ICD-10-CM | POA: Diagnosis not present

## 2020-06-17 DIAGNOSIS — G500 Trigeminal neuralgia: Secondary | ICD-10-CM | POA: Diagnosis not present

## 2020-06-17 DIAGNOSIS — I5032 Chronic diastolic (congestive) heart failure: Secondary | ICD-10-CM | POA: Diagnosis not present

## 2020-06-17 DIAGNOSIS — I480 Paroxysmal atrial fibrillation: Secondary | ICD-10-CM | POA: Diagnosis not present

## 2020-06-18 DIAGNOSIS — I5032 Chronic diastolic (congestive) heart failure: Secondary | ICD-10-CM | POA: Diagnosis not present

## 2020-06-18 DIAGNOSIS — I2510 Atherosclerotic heart disease of native coronary artery without angina pectoris: Secondary | ICD-10-CM | POA: Diagnosis not present

## 2020-06-18 DIAGNOSIS — I480 Paroxysmal atrial fibrillation: Secondary | ICD-10-CM | POA: Diagnosis not present

## 2020-06-18 DIAGNOSIS — G500 Trigeminal neuralgia: Secondary | ICD-10-CM | POA: Diagnosis not present

## 2020-06-18 DIAGNOSIS — I69351 Hemiplegia and hemiparesis following cerebral infarction affecting right dominant side: Secondary | ICD-10-CM | POA: Diagnosis not present

## 2020-06-18 DIAGNOSIS — I110 Hypertensive heart disease with heart failure: Secondary | ICD-10-CM | POA: Diagnosis not present

## 2020-06-18 DIAGNOSIS — M1990 Unspecified osteoarthritis, unspecified site: Secondary | ICD-10-CM | POA: Diagnosis not present

## 2020-06-18 DIAGNOSIS — I6521 Occlusion and stenosis of right carotid artery: Secondary | ICD-10-CM | POA: Diagnosis not present

## 2020-06-18 DIAGNOSIS — G629 Polyneuropathy, unspecified: Secondary | ICD-10-CM | POA: Diagnosis not present

## 2020-06-21 DIAGNOSIS — I69351 Hemiplegia and hemiparesis following cerebral infarction affecting right dominant side: Secondary | ICD-10-CM | POA: Diagnosis not present

## 2020-06-21 DIAGNOSIS — I2510 Atherosclerotic heart disease of native coronary artery without angina pectoris: Secondary | ICD-10-CM | POA: Diagnosis not present

## 2020-06-21 DIAGNOSIS — M1990 Unspecified osteoarthritis, unspecified site: Secondary | ICD-10-CM | POA: Diagnosis not present

## 2020-06-21 DIAGNOSIS — G629 Polyneuropathy, unspecified: Secondary | ICD-10-CM | POA: Diagnosis not present

## 2020-06-21 DIAGNOSIS — I5032 Chronic diastolic (congestive) heart failure: Secondary | ICD-10-CM | POA: Diagnosis not present

## 2020-06-21 DIAGNOSIS — I110 Hypertensive heart disease with heart failure: Secondary | ICD-10-CM | POA: Diagnosis not present

## 2020-06-21 DIAGNOSIS — G500 Trigeminal neuralgia: Secondary | ICD-10-CM | POA: Diagnosis not present

## 2020-06-21 DIAGNOSIS — I480 Paroxysmal atrial fibrillation: Secondary | ICD-10-CM | POA: Diagnosis not present

## 2020-06-21 DIAGNOSIS — I6521 Occlusion and stenosis of right carotid artery: Secondary | ICD-10-CM | POA: Diagnosis not present

## 2020-06-24 DIAGNOSIS — G500 Trigeminal neuralgia: Secondary | ICD-10-CM | POA: Diagnosis not present

## 2020-06-24 DIAGNOSIS — I2510 Atherosclerotic heart disease of native coronary artery without angina pectoris: Secondary | ICD-10-CM | POA: Diagnosis not present

## 2020-06-24 DIAGNOSIS — G629 Polyneuropathy, unspecified: Secondary | ICD-10-CM | POA: Diagnosis not present

## 2020-06-24 DIAGNOSIS — I110 Hypertensive heart disease with heart failure: Secondary | ICD-10-CM | POA: Diagnosis not present

## 2020-06-24 DIAGNOSIS — I6521 Occlusion and stenosis of right carotid artery: Secondary | ICD-10-CM | POA: Diagnosis not present

## 2020-06-24 DIAGNOSIS — I480 Paroxysmal atrial fibrillation: Secondary | ICD-10-CM | POA: Diagnosis not present

## 2020-06-24 DIAGNOSIS — M1990 Unspecified osteoarthritis, unspecified site: Secondary | ICD-10-CM | POA: Diagnosis not present

## 2020-06-24 DIAGNOSIS — I5032 Chronic diastolic (congestive) heart failure: Secondary | ICD-10-CM | POA: Diagnosis not present

## 2020-06-24 DIAGNOSIS — I69351 Hemiplegia and hemiparesis following cerebral infarction affecting right dominant side: Secondary | ICD-10-CM | POA: Diagnosis not present

## 2020-06-25 DIAGNOSIS — M419 Scoliosis, unspecified: Secondary | ICD-10-CM | POA: Diagnosis not present

## 2020-06-25 DIAGNOSIS — M25552 Pain in left hip: Secondary | ICD-10-CM | POA: Diagnosis not present

## 2020-06-25 DIAGNOSIS — Y92009 Unspecified place in unspecified non-institutional (private) residence as the place of occurrence of the external cause: Secondary | ICD-10-CM | POA: Diagnosis not present

## 2020-06-25 DIAGNOSIS — I517 Cardiomegaly: Secondary | ICD-10-CM | POA: Diagnosis not present

## 2020-06-25 DIAGNOSIS — R0789 Other chest pain: Secondary | ICD-10-CM | POA: Diagnosis not present

## 2020-06-25 DIAGNOSIS — W01198A Fall on same level from slipping, tripping and stumbling with subsequent striking against other object, initial encounter: Secondary | ICD-10-CM | POA: Diagnosis not present

## 2020-06-25 DIAGNOSIS — M533 Sacrococcygeal disorders, not elsewhere classified: Secondary | ICD-10-CM | POA: Diagnosis not present

## 2020-06-25 DIAGNOSIS — R52 Pain, unspecified: Secondary | ICD-10-CM | POA: Diagnosis not present

## 2020-06-25 DIAGNOSIS — I708 Atherosclerosis of other arteries: Secondary | ICD-10-CM | POA: Diagnosis not present

## 2020-06-25 DIAGNOSIS — R21 Rash and other nonspecific skin eruption: Secondary | ICD-10-CM | POA: Diagnosis not present

## 2020-06-25 DIAGNOSIS — M7918 Myalgia, other site: Secondary | ICD-10-CM | POA: Diagnosis not present

## 2020-06-25 DIAGNOSIS — I480 Paroxysmal atrial fibrillation: Secondary | ICD-10-CM | POA: Diagnosis not present

## 2020-06-25 DIAGNOSIS — Z952 Presence of prosthetic heart valve: Secondary | ICD-10-CM | POA: Diagnosis not present

## 2020-06-28 DIAGNOSIS — I69351 Hemiplegia and hemiparesis following cerebral infarction affecting right dominant side: Secondary | ICD-10-CM | POA: Diagnosis not present

## 2020-06-28 DIAGNOSIS — G500 Trigeminal neuralgia: Secondary | ICD-10-CM | POA: Diagnosis not present

## 2020-06-28 DIAGNOSIS — M1990 Unspecified osteoarthritis, unspecified site: Secondary | ICD-10-CM | POA: Diagnosis not present

## 2020-06-28 DIAGNOSIS — I5032 Chronic diastolic (congestive) heart failure: Secondary | ICD-10-CM | POA: Diagnosis not present

## 2020-06-28 DIAGNOSIS — I110 Hypertensive heart disease with heart failure: Secondary | ICD-10-CM | POA: Diagnosis not present

## 2020-06-28 DIAGNOSIS — G629 Polyneuropathy, unspecified: Secondary | ICD-10-CM | POA: Diagnosis not present

## 2020-06-28 DIAGNOSIS — I480 Paroxysmal atrial fibrillation: Secondary | ICD-10-CM | POA: Diagnosis not present

## 2020-06-28 DIAGNOSIS — I2510 Atherosclerotic heart disease of native coronary artery without angina pectoris: Secondary | ICD-10-CM | POA: Diagnosis not present

## 2020-06-28 DIAGNOSIS — I6521 Occlusion and stenosis of right carotid artery: Secondary | ICD-10-CM | POA: Diagnosis not present

## 2020-06-29 ENCOUNTER — Other Ambulatory Visit: Payer: Self-pay | Admitting: Family Medicine

## 2020-06-29 DIAGNOSIS — I6521 Occlusion and stenosis of right carotid artery: Secondary | ICD-10-CM | POA: Diagnosis not present

## 2020-06-29 DIAGNOSIS — G500 Trigeminal neuralgia: Secondary | ICD-10-CM | POA: Diagnosis not present

## 2020-06-29 DIAGNOSIS — G629 Polyneuropathy, unspecified: Secondary | ICD-10-CM | POA: Diagnosis not present

## 2020-06-29 DIAGNOSIS — M1990 Unspecified osteoarthritis, unspecified site: Secondary | ICD-10-CM | POA: Diagnosis not present

## 2020-06-29 DIAGNOSIS — I480 Paroxysmal atrial fibrillation: Secondary | ICD-10-CM | POA: Diagnosis not present

## 2020-06-29 DIAGNOSIS — I2510 Atherosclerotic heart disease of native coronary artery without angina pectoris: Secondary | ICD-10-CM | POA: Diagnosis not present

## 2020-06-29 DIAGNOSIS — I110 Hypertensive heart disease with heart failure: Secondary | ICD-10-CM | POA: Diagnosis not present

## 2020-06-29 DIAGNOSIS — I5032 Chronic diastolic (congestive) heart failure: Secondary | ICD-10-CM | POA: Diagnosis not present

## 2020-06-29 DIAGNOSIS — I69351 Hemiplegia and hemiparesis following cerebral infarction affecting right dominant side: Secondary | ICD-10-CM | POA: Diagnosis not present

## 2020-06-29 DIAGNOSIS — R29898 Other symptoms and signs involving the musculoskeletal system: Secondary | ICD-10-CM

## 2020-06-30 ENCOUNTER — Other Ambulatory Visit: Payer: Self-pay | Admitting: Family Medicine

## 2020-06-30 ENCOUNTER — Ambulatory Visit: Payer: Medicare HMO

## 2020-06-30 ENCOUNTER — Ambulatory Visit
Admission: RE | Admit: 2020-06-30 | Discharge: 2020-06-30 | Disposition: A | Discharge status: Home / Self Care | Payer: Medicare HMO | Source: Ambulatory Visit | Attending: Family Medicine | Admitting: Family Medicine

## 2020-06-30 ENCOUNTER — Other Ambulatory Visit: Payer: Self-pay

## 2020-06-30 DIAGNOSIS — I712 Thoracic aortic aneurysm, without rupture: Secondary | ICD-10-CM | POA: Insufficient documentation

## 2020-06-30 DIAGNOSIS — I700 Atherosclerosis of aorta: Secondary | ICD-10-CM | POA: Insufficient documentation

## 2020-06-30 DIAGNOSIS — S2220XA Unspecified fracture of sternum, initial encounter for closed fracture: Secondary | ICD-10-CM | POA: Diagnosis not present

## 2020-06-30 DIAGNOSIS — G500 Trigeminal neuralgia: Secondary | ICD-10-CM | POA: Diagnosis not present

## 2020-06-30 DIAGNOSIS — I110 Hypertensive heart disease with heart failure: Secondary | ICD-10-CM | POA: Diagnosis not present

## 2020-06-30 DIAGNOSIS — I5032 Chronic diastolic (congestive) heart failure: Secondary | ICD-10-CM | POA: Diagnosis not present

## 2020-06-30 DIAGNOSIS — R918 Other nonspecific abnormal finding of lung field: Secondary | ICD-10-CM | POA: Diagnosis not present

## 2020-06-30 DIAGNOSIS — R29898 Other symptoms and signs involving the musculoskeletal system: Secondary | ICD-10-CM

## 2020-06-30 DIAGNOSIS — S2242XA Multiple fractures of ribs, left side, initial encounter for closed fracture: Secondary | ICD-10-CM | POA: Diagnosis not present

## 2020-06-30 DIAGNOSIS — G629 Polyneuropathy, unspecified: Secondary | ICD-10-CM | POA: Diagnosis not present

## 2020-06-30 DIAGNOSIS — I6521 Occlusion and stenosis of right carotid artery: Secondary | ICD-10-CM | POA: Diagnosis not present

## 2020-06-30 DIAGNOSIS — I2510 Atherosclerotic heart disease of native coronary artery without angina pectoris: Secondary | ICD-10-CM | POA: Diagnosis not present

## 2020-06-30 DIAGNOSIS — M1990 Unspecified osteoarthritis, unspecified site: Secondary | ICD-10-CM | POA: Diagnosis not present

## 2020-06-30 DIAGNOSIS — I69351 Hemiplegia and hemiparesis following cerebral infarction affecting right dominant side: Secondary | ICD-10-CM | POA: Diagnosis not present

## 2020-06-30 DIAGNOSIS — I480 Paroxysmal atrial fibrillation: Secondary | ICD-10-CM | POA: Diagnosis not present

## 2020-07-05 DIAGNOSIS — M1990 Unspecified osteoarthritis, unspecified site: Secondary | ICD-10-CM | POA: Diagnosis not present

## 2020-07-05 DIAGNOSIS — I110 Hypertensive heart disease with heart failure: Secondary | ICD-10-CM | POA: Diagnosis not present

## 2020-07-05 DIAGNOSIS — G500 Trigeminal neuralgia: Secondary | ICD-10-CM | POA: Diagnosis not present

## 2020-07-05 DIAGNOSIS — I69351 Hemiplegia and hemiparesis following cerebral infarction affecting right dominant side: Secondary | ICD-10-CM | POA: Diagnosis not present

## 2020-07-05 DIAGNOSIS — I2510 Atherosclerotic heart disease of native coronary artery without angina pectoris: Secondary | ICD-10-CM | POA: Diagnosis not present

## 2020-07-05 DIAGNOSIS — I480 Paroxysmal atrial fibrillation: Secondary | ICD-10-CM | POA: Diagnosis not present

## 2020-07-05 DIAGNOSIS — I6521 Occlusion and stenosis of right carotid artery: Secondary | ICD-10-CM | POA: Diagnosis not present

## 2020-07-05 DIAGNOSIS — I5032 Chronic diastolic (congestive) heart failure: Secondary | ICD-10-CM | POA: Diagnosis not present

## 2020-07-05 DIAGNOSIS — G629 Polyneuropathy, unspecified: Secondary | ICD-10-CM | POA: Diagnosis not present

## 2020-07-07 DIAGNOSIS — M1990 Unspecified osteoarthritis, unspecified site: Secondary | ICD-10-CM | POA: Diagnosis not present

## 2020-07-07 DIAGNOSIS — I69351 Hemiplegia and hemiparesis following cerebral infarction affecting right dominant side: Secondary | ICD-10-CM | POA: Diagnosis not present

## 2020-07-07 DIAGNOSIS — I5032 Chronic diastolic (congestive) heart failure: Secondary | ICD-10-CM | POA: Diagnosis not present

## 2020-07-07 DIAGNOSIS — I110 Hypertensive heart disease with heart failure: Secondary | ICD-10-CM | POA: Diagnosis not present

## 2020-07-07 DIAGNOSIS — I2510 Atherosclerotic heart disease of native coronary artery without angina pectoris: Secondary | ICD-10-CM | POA: Diagnosis not present

## 2020-07-07 DIAGNOSIS — G629 Polyneuropathy, unspecified: Secondary | ICD-10-CM | POA: Diagnosis not present

## 2020-07-07 DIAGNOSIS — I480 Paroxysmal atrial fibrillation: Secondary | ICD-10-CM | POA: Diagnosis not present

## 2020-07-07 DIAGNOSIS — I6521 Occlusion and stenosis of right carotid artery: Secondary | ICD-10-CM | POA: Diagnosis not present

## 2020-07-07 DIAGNOSIS — G500 Trigeminal neuralgia: Secondary | ICD-10-CM | POA: Diagnosis not present

## 2020-07-12 DIAGNOSIS — I110 Hypertensive heart disease with heart failure: Secondary | ICD-10-CM | POA: Diagnosis not present

## 2020-07-12 DIAGNOSIS — G629 Polyneuropathy, unspecified: Secondary | ICD-10-CM | POA: Diagnosis not present

## 2020-07-12 DIAGNOSIS — I2510 Atherosclerotic heart disease of native coronary artery without angina pectoris: Secondary | ICD-10-CM | POA: Diagnosis not present

## 2020-07-12 DIAGNOSIS — M1990 Unspecified osteoarthritis, unspecified site: Secondary | ICD-10-CM | POA: Diagnosis not present

## 2020-07-12 DIAGNOSIS — I480 Paroxysmal atrial fibrillation: Secondary | ICD-10-CM | POA: Diagnosis not present

## 2020-07-12 DIAGNOSIS — I5032 Chronic diastolic (congestive) heart failure: Secondary | ICD-10-CM | POA: Diagnosis not present

## 2020-07-12 DIAGNOSIS — G500 Trigeminal neuralgia: Secondary | ICD-10-CM | POA: Diagnosis not present

## 2020-07-12 DIAGNOSIS — I69351 Hemiplegia and hemiparesis following cerebral infarction affecting right dominant side: Secondary | ICD-10-CM | POA: Diagnosis not present

## 2020-07-12 DIAGNOSIS — I6521 Occlusion and stenosis of right carotid artery: Secondary | ICD-10-CM | POA: Diagnosis not present

## 2020-07-15 DIAGNOSIS — G629 Polyneuropathy, unspecified: Secondary | ICD-10-CM | POA: Diagnosis not present

## 2020-07-15 DIAGNOSIS — G500 Trigeminal neuralgia: Secondary | ICD-10-CM | POA: Diagnosis not present

## 2020-07-15 DIAGNOSIS — I2510 Atherosclerotic heart disease of native coronary artery without angina pectoris: Secondary | ICD-10-CM | POA: Diagnosis not present

## 2020-07-15 DIAGNOSIS — I110 Hypertensive heart disease with heart failure: Secondary | ICD-10-CM | POA: Diagnosis not present

## 2020-07-15 DIAGNOSIS — I480 Paroxysmal atrial fibrillation: Secondary | ICD-10-CM | POA: Diagnosis not present

## 2020-07-15 DIAGNOSIS — I6521 Occlusion and stenosis of right carotid artery: Secondary | ICD-10-CM | POA: Diagnosis not present

## 2020-07-15 DIAGNOSIS — I5032 Chronic diastolic (congestive) heart failure: Secondary | ICD-10-CM | POA: Diagnosis not present

## 2020-07-15 DIAGNOSIS — M1990 Unspecified osteoarthritis, unspecified site: Secondary | ICD-10-CM | POA: Diagnosis not present

## 2020-07-15 DIAGNOSIS — I69351 Hemiplegia and hemiparesis following cerebral infarction affecting right dominant side: Secondary | ICD-10-CM | POA: Diagnosis not present

## 2020-07-19 DIAGNOSIS — I2510 Atherosclerotic heart disease of native coronary artery without angina pectoris: Secondary | ICD-10-CM | POA: Diagnosis not present

## 2020-07-19 DIAGNOSIS — I480 Paroxysmal atrial fibrillation: Secondary | ICD-10-CM | POA: Diagnosis not present

## 2020-07-19 DIAGNOSIS — I110 Hypertensive heart disease with heart failure: Secondary | ICD-10-CM | POA: Diagnosis not present

## 2020-07-19 DIAGNOSIS — I6521 Occlusion and stenosis of right carotid artery: Secondary | ICD-10-CM | POA: Diagnosis not present

## 2020-07-19 DIAGNOSIS — I69351 Hemiplegia and hemiparesis following cerebral infarction affecting right dominant side: Secondary | ICD-10-CM | POA: Diagnosis not present

## 2020-07-19 DIAGNOSIS — G500 Trigeminal neuralgia: Secondary | ICD-10-CM | POA: Diagnosis not present

## 2020-07-19 DIAGNOSIS — G629 Polyneuropathy, unspecified: Secondary | ICD-10-CM | POA: Diagnosis not present

## 2020-07-19 DIAGNOSIS — I5032 Chronic diastolic (congestive) heart failure: Secondary | ICD-10-CM | POA: Diagnosis not present

## 2020-07-19 DIAGNOSIS — M1990 Unspecified osteoarthritis, unspecified site: Secondary | ICD-10-CM | POA: Diagnosis not present

## 2020-07-20 DIAGNOSIS — H401132 Primary open-angle glaucoma, bilateral, moderate stage: Secondary | ICD-10-CM | POA: Diagnosis not present

## 2020-07-20 DIAGNOSIS — Z0100 Encounter for examination of eyes and vision without abnormal findings: Secondary | ICD-10-CM | POA: Diagnosis not present

## 2020-07-21 DIAGNOSIS — I6521 Occlusion and stenosis of right carotid artery: Secondary | ICD-10-CM | POA: Diagnosis not present

## 2020-07-21 DIAGNOSIS — G629 Polyneuropathy, unspecified: Secondary | ICD-10-CM | POA: Diagnosis not present

## 2020-07-21 DIAGNOSIS — I2510 Atherosclerotic heart disease of native coronary artery without angina pectoris: Secondary | ICD-10-CM | POA: Diagnosis not present

## 2020-07-21 DIAGNOSIS — I69351 Hemiplegia and hemiparesis following cerebral infarction affecting right dominant side: Secondary | ICD-10-CM | POA: Diagnosis not present

## 2020-07-21 DIAGNOSIS — M1990 Unspecified osteoarthritis, unspecified site: Secondary | ICD-10-CM | POA: Diagnosis not present

## 2020-07-21 DIAGNOSIS — I110 Hypertensive heart disease with heart failure: Secondary | ICD-10-CM | POA: Diagnosis not present

## 2020-07-21 DIAGNOSIS — I480 Paroxysmal atrial fibrillation: Secondary | ICD-10-CM | POA: Diagnosis not present

## 2020-07-21 DIAGNOSIS — I5032 Chronic diastolic (congestive) heart failure: Secondary | ICD-10-CM | POA: Diagnosis not present

## 2020-07-21 DIAGNOSIS — G500 Trigeminal neuralgia: Secondary | ICD-10-CM | POA: Diagnosis not present

## 2020-08-06 DIAGNOSIS — R300 Dysuria: Secondary | ICD-10-CM | POA: Diagnosis not present

## 2020-08-10 ENCOUNTER — Other Ambulatory Visit: Payer: Self-pay

## 2020-08-10 ENCOUNTER — Other Ambulatory Visit: Payer: Self-pay | Admitting: Family Medicine

## 2020-08-10 ENCOUNTER — Other Ambulatory Visit: Payer: Medicare HMO

## 2020-08-10 ENCOUNTER — Ambulatory Visit
Admission: RE | Admit: 2020-08-10 | Discharge: 2020-08-10 | Disposition: A | Discharge status: Home / Self Care | Payer: Medicare HMO | Source: Ambulatory Visit | Attending: Family Medicine | Admitting: Family Medicine

## 2020-08-10 DIAGNOSIS — I6782 Cerebral ischemia: Secondary | ICD-10-CM | POA: Diagnosis not present

## 2020-08-10 DIAGNOSIS — W19XXXA Unspecified fall, initial encounter: Secondary | ICD-10-CM

## 2020-08-10 DIAGNOSIS — M5489 Other dorsalgia: Secondary | ICD-10-CM | POA: Diagnosis not present

## 2020-08-10 DIAGNOSIS — Z043 Encounter for examination and observation following other accident: Secondary | ICD-10-CM | POA: Diagnosis not present

## 2020-08-10 DIAGNOSIS — I259 Chronic ischemic heart disease, unspecified: Secondary | ICD-10-CM | POA: Diagnosis not present

## 2020-08-10 DIAGNOSIS — W010XXA Fall on same level from slipping, tripping and stumbling without subsequent striking against object, initial encounter: Secondary | ICD-10-CM | POA: Diagnosis not present

## 2020-08-10 DIAGNOSIS — W01190A Fall on same level from slipping, tripping and stumbling with subsequent striking against furniture, initial encounter: Secondary | ICD-10-CM | POA: Diagnosis not present

## 2020-08-10 DIAGNOSIS — S0083XA Contusion of other part of head, initial encounter: Secondary | ICD-10-CM | POA: Insufficient documentation

## 2020-08-10 DIAGNOSIS — R0781 Pleurodynia: Secondary | ICD-10-CM | POA: Diagnosis not present

## 2020-08-10 DIAGNOSIS — Y92009 Unspecified place in unspecified non-institutional (private) residence as the place of occurrence of the external cause: Secondary | ICD-10-CM | POA: Diagnosis not present

## 2020-08-11 DIAGNOSIS — M546 Pain in thoracic spine: Secondary | ICD-10-CM | POA: Diagnosis not present

## 2020-08-11 DIAGNOSIS — Z952 Presence of prosthetic heart valve: Secondary | ICD-10-CM | POA: Diagnosis not present

## 2020-08-11 DIAGNOSIS — M47814 Spondylosis without myelopathy or radiculopathy, thoracic region: Secondary | ICD-10-CM | POA: Diagnosis not present

## 2020-08-11 DIAGNOSIS — J90 Pleural effusion, not elsewhere classified: Secondary | ICD-10-CM | POA: Diagnosis not present

## 2020-08-11 DIAGNOSIS — M4184 Other forms of scoliosis, thoracic region: Secondary | ICD-10-CM | POA: Diagnosis not present

## 2020-09-20 DIAGNOSIS — M79672 Pain in left foot: Secondary | ICD-10-CM | POA: Diagnosis not present

## 2020-09-20 DIAGNOSIS — I5032 Chronic diastolic (congestive) heart failure: Secondary | ICD-10-CM | POA: Diagnosis not present

## 2020-09-20 DIAGNOSIS — S99922A Unspecified injury of left foot, initial encounter: Secondary | ICD-10-CM | POA: Diagnosis not present

## 2020-09-20 DIAGNOSIS — M778 Other enthesopathies, not elsewhere classified: Secondary | ICD-10-CM | POA: Diagnosis not present

## 2020-09-20 DIAGNOSIS — I480 Paroxysmal atrial fibrillation: Secondary | ICD-10-CM | POA: Diagnosis not present

## 2020-11-19 DIAGNOSIS — M5416 Radiculopathy, lumbar region: Secondary | ICD-10-CM | POA: Diagnosis not present

## 2020-11-19 DIAGNOSIS — I480 Paroxysmal atrial fibrillation: Secondary | ICD-10-CM | POA: Diagnosis not present

## 2020-11-19 DIAGNOSIS — H409 Unspecified glaucoma: Secondary | ICD-10-CM | POA: Diagnosis not present

## 2020-12-30 ENCOUNTER — Ambulatory Visit
Admission: RE | Admit: 2020-12-30 | Discharge: 2020-12-30 | Disposition: A | Discharge status: Home / Self Care | Payer: Medicare HMO | Source: Ambulatory Visit | Attending: Physician Assistant | Admitting: Physician Assistant

## 2020-12-30 ENCOUNTER — Other Ambulatory Visit: Payer: Self-pay | Admitting: Physician Assistant

## 2020-12-30 ENCOUNTER — Other Ambulatory Visit: Payer: Self-pay

## 2020-12-30 DIAGNOSIS — W01198A Fall on same level from slipping, tripping and stumbling with subsequent striking against other object, initial encounter: Secondary | ICD-10-CM | POA: Diagnosis not present

## 2020-12-30 DIAGNOSIS — M5430 Sciatica, unspecified side: Secondary | ICD-10-CM | POA: Diagnosis not present

## 2020-12-30 DIAGNOSIS — W19XXXA Unspecified fall, initial encounter: Secondary | ICD-10-CM | POA: Insufficient documentation

## 2020-12-30 DIAGNOSIS — M542 Cervicalgia: Secondary | ICD-10-CM | POA: Diagnosis not present

## 2020-12-30 DIAGNOSIS — S0990XA Unspecified injury of head, initial encounter: Secondary | ICD-10-CM | POA: Diagnosis not present

## 2020-12-30 DIAGNOSIS — M5440 Lumbago with sciatica, unspecified side: Secondary | ICD-10-CM | POA: Diagnosis not present

## 2020-12-30 DIAGNOSIS — G319 Degenerative disease of nervous system, unspecified: Secondary | ICD-10-CM | POA: Diagnosis not present

## 2020-12-30 DIAGNOSIS — Y92009 Unspecified place in unspecified non-institutional (private) residence as the place of occurrence of the external cause: Secondary | ICD-10-CM | POA: Diagnosis not present

## 2020-12-30 DIAGNOSIS — M5441 Lumbago with sciatica, right side: Secondary | ICD-10-CM | POA: Diagnosis not present

## 2020-12-30 DIAGNOSIS — R519 Headache, unspecified: Secondary | ICD-10-CM | POA: Diagnosis not present

## 2020-12-30 DIAGNOSIS — I878 Other specified disorders of veins: Secondary | ICD-10-CM | POA: Diagnosis not present

## 2020-12-30 DIAGNOSIS — M5442 Lumbago with sciatica, left side: Secondary | ICD-10-CM | POA: Diagnosis not present

## 2021-01-06 ENCOUNTER — Ambulatory Visit
Admission: RE | Admit: 2021-01-06 | Discharge: 2021-01-06 | Disposition: A | Discharge status: Home / Self Care | Payer: Medicare HMO | Source: Ambulatory Visit | Attending: Internal Medicine | Admitting: Internal Medicine

## 2021-01-06 ENCOUNTER — Other Ambulatory Visit: Payer: Self-pay | Admitting: Internal Medicine

## 2021-01-06 ENCOUNTER — Other Ambulatory Visit: Payer: Self-pay

## 2021-01-06 ENCOUNTER — Other Ambulatory Visit (HOSPITAL_COMMUNITY): Payer: Self-pay | Admitting: Internal Medicine

## 2021-01-06 DIAGNOSIS — M47816 Spondylosis without myelopathy or radiculopathy, lumbar region: Secondary | ICD-10-CM | POA: Diagnosis not present

## 2021-01-06 DIAGNOSIS — Z23 Encounter for immunization: Secondary | ICD-10-CM | POA: Diagnosis not present

## 2021-01-06 DIAGNOSIS — M4856XA Collapsed vertebra, not elsewhere classified, lumbar region, initial encounter for fracture: Secondary | ICD-10-CM

## 2021-01-06 DIAGNOSIS — M48061 Spinal stenosis, lumbar region without neurogenic claudication: Secondary | ICD-10-CM | POA: Diagnosis not present

## 2021-01-11 ENCOUNTER — Observation Stay
Admission: EM | Admit: 2021-01-11 | Discharge: 2021-01-18 | Disposition: A | Discharge status: Home / Self Care | Payer: Medicare HMO | Attending: Internal Medicine | Admitting: Internal Medicine

## 2021-01-11 ENCOUNTER — Emergency Department: Payer: Medicare HMO

## 2021-01-11 ENCOUNTER — Other Ambulatory Visit: Payer: Self-pay

## 2021-01-11 DIAGNOSIS — R29898 Other symptoms and signs involving the musculoskeletal system: Secondary | ICD-10-CM

## 2021-01-11 DIAGNOSIS — I5032 Chronic diastolic (congestive) heart failure: Secondary | ICD-10-CM | POA: Insufficient documentation

## 2021-01-11 DIAGNOSIS — Z20822 Contact with and (suspected) exposure to covid-19: Secondary | ICD-10-CM | POA: Diagnosis not present

## 2021-01-11 DIAGNOSIS — W182XXA Fall in (into) shower or empty bathtub, initial encounter: Secondary | ICD-10-CM | POA: Diagnosis not present

## 2021-01-11 DIAGNOSIS — M47812 Spondylosis without myelopathy or radiculopathy, cervical region: Secondary | ICD-10-CM | POA: Diagnosis not present

## 2021-01-11 DIAGNOSIS — Z87891 Personal history of nicotine dependence: Secondary | ICD-10-CM | POA: Insufficient documentation

## 2021-01-11 DIAGNOSIS — Z043 Encounter for examination and observation following other accident: Secondary | ICD-10-CM | POA: Diagnosis not present

## 2021-01-11 DIAGNOSIS — S82892A Other fracture of left lower leg, initial encounter for closed fracture: Secondary | ICD-10-CM | POA: Diagnosis not present

## 2021-01-11 DIAGNOSIS — S0990XA Unspecified injury of head, initial encounter: Secondary | ICD-10-CM | POA: Insufficient documentation

## 2021-01-11 DIAGNOSIS — S2239XA Fracture of one rib, unspecified side, initial encounter for closed fracture: Secondary | ICD-10-CM | POA: Diagnosis present

## 2021-01-11 DIAGNOSIS — Z7401 Bed confinement status: Secondary | ICD-10-CM | POA: Diagnosis not present

## 2021-01-11 DIAGNOSIS — Z7901 Long term (current) use of anticoagulants: Secondary | ICD-10-CM | POA: Diagnosis not present

## 2021-01-11 DIAGNOSIS — S32030A Wedge compression fracture of third lumbar vertebra, initial encounter for closed fracture: Secondary | ICD-10-CM | POA: Diagnosis not present

## 2021-01-11 DIAGNOSIS — I6782 Cerebral ischemia: Secondary | ICD-10-CM | POA: Diagnosis not present

## 2021-01-11 DIAGNOSIS — Z79899 Other long term (current) drug therapy: Secondary | ICD-10-CM | POA: Insufficient documentation

## 2021-01-11 DIAGNOSIS — I480 Paroxysmal atrial fibrillation: Secondary | ICD-10-CM | POA: Diagnosis not present

## 2021-01-11 DIAGNOSIS — Y92002 Bathroom of unspecified non-institutional (private) residence single-family (private) house as the place of occurrence of the external cause: Secondary | ICD-10-CM | POA: Diagnosis not present

## 2021-01-11 DIAGNOSIS — G238 Other specified degenerative diseases of basal ganglia: Secondary | ICD-10-CM | POA: Diagnosis not present

## 2021-01-11 DIAGNOSIS — M4850XA Collapsed vertebra, not elsewhere classified, site unspecified, initial encounter for fracture: Secondary | ICD-10-CM | POA: Diagnosis present

## 2021-01-11 DIAGNOSIS — S93402A Sprain of unspecified ligament of left ankle, initial encounter: Secondary | ICD-10-CM

## 2021-01-11 DIAGNOSIS — Z8673 Personal history of transient ischemic attack (TIA), and cerebral infarction without residual deficits: Secondary | ICD-10-CM | POA: Diagnosis not present

## 2021-01-11 DIAGNOSIS — F32A Depression, unspecified: Secondary | ICD-10-CM | POA: Diagnosis present

## 2021-01-11 DIAGNOSIS — I10 Essential (primary) hypertension: Secondary | ICD-10-CM | POA: Diagnosis present

## 2021-01-11 DIAGNOSIS — I639 Cerebral infarction, unspecified: Secondary | ICD-10-CM | POA: Diagnosis not present

## 2021-01-11 DIAGNOSIS — S299XXA Unspecified injury of thorax, initial encounter: Secondary | ICD-10-CM | POA: Diagnosis not present

## 2021-01-11 DIAGNOSIS — W19XXXA Unspecified fall, initial encounter: Secondary | ICD-10-CM | POA: Diagnosis present

## 2021-01-11 DIAGNOSIS — S2231XA Fracture of one rib, right side, initial encounter for closed fracture: Secondary | ICD-10-CM | POA: Diagnosis not present

## 2021-01-11 DIAGNOSIS — S93492A Sprain of other ligament of left ankle, initial encounter: Secondary | ICD-10-CM | POA: Diagnosis not present

## 2021-01-11 DIAGNOSIS — M47814 Spondylosis without myelopathy or radiculopathy, thoracic region: Secondary | ICD-10-CM | POA: Diagnosis not present

## 2021-01-11 DIAGNOSIS — Z85038 Personal history of other malignant neoplasm of large intestine: Secondary | ICD-10-CM | POA: Insufficient documentation

## 2021-01-11 DIAGNOSIS — J984 Other disorders of lung: Secondary | ICD-10-CM | POA: Diagnosis not present

## 2021-01-11 DIAGNOSIS — G319 Degenerative disease of nervous system, unspecified: Secondary | ICD-10-CM | POA: Diagnosis not present

## 2021-01-11 DIAGNOSIS — S2243XA Multiple fractures of ribs, bilateral, initial encounter for closed fracture: Secondary | ICD-10-CM | POA: Diagnosis not present

## 2021-01-11 DIAGNOSIS — E785 Hyperlipidemia, unspecified: Secondary | ICD-10-CM | POA: Diagnosis not present

## 2021-01-11 DIAGNOSIS — M25572 Pain in left ankle and joints of left foot: Secondary | ICD-10-CM | POA: Diagnosis not present

## 2021-01-11 DIAGNOSIS — I5030 Unspecified diastolic (congestive) heart failure: Secondary | ICD-10-CM | POA: Diagnosis present

## 2021-01-11 DIAGNOSIS — S199XXA Unspecified injury of neck, initial encounter: Secondary | ICD-10-CM | POA: Diagnosis not present

## 2021-01-11 LAB — BASIC METABOLIC PANEL
Anion gap: 9 (ref 5–15)
BUN: 19 mg/dL (ref 8–23)
CO2: 30 mmol/L (ref 22–32)
Calcium: 8.8 mg/dL — ABNORMAL LOW (ref 8.9–10.3)
Chloride: 97 mmol/L — ABNORMAL LOW (ref 98–111)
Creatinine, Ser: 0.85 mg/dL (ref 0.44–1.00)
GFR, Estimated: >60 mL/min (ref >60)
Glucose, Bld: 114 mg/dL — ABNORMAL HIGH (ref 70–99)
Potassium: 4.1 mmol/L (ref 3.5–5.1)
Sodium: 136 mmol/L (ref 135–145)

## 2021-01-11 LAB — CBC WITH DIFFERENTIAL/PLATELET
Abs Immature Granulocytes: 0.02 10*3/uL (ref 0.00–0.07)
Basophils Absolute: 0 10*3/uL (ref 0.0–0.1)
Basophils Relative: 0 %
Eosinophils Absolute: 0.2 10*3/uL (ref 0.0–0.5)
Eosinophils Relative: 3 %
HCT: 41 % (ref 36.0–46.0)
Hemoglobin: 13.3 g/dL (ref 12.0–15.0)
Immature Granulocytes: 0 %
Lymphocytes Relative: 24 %
Lymphs Abs: 1.8 10*3/uL (ref 0.7–4.0)
MCH: 30.2 pg (ref 26.0–34.0)
MCHC: 32.4 g/dL (ref 30.0–36.0)
MCV: 93.2 fL (ref 80.0–100.0)
Monocytes Absolute: 0.7 10*3/uL (ref 0.1–1.0)
Monocytes Relative: 9 %
Neutro Abs: 4.8 10*3/uL (ref 1.7–7.7)
Neutrophils Relative %: 64 %
Platelets: 147 10*3/uL — ABNORMAL LOW (ref 150–400)
RBC: 4.4 MIL/uL (ref 3.87–5.11)
RDW: 12.7 % (ref 11.5–15.5)
WBC: 7.6 10*3/uL (ref 4.0–10.5)
nRBC: 0 % (ref 0.0–0.2)

## 2021-01-11 LAB — BRAIN NATRIURETIC PEPTIDE: B Natriuretic Peptide: 102.6 pg/mL — ABNORMAL HIGH (ref 0.0–100.0)

## 2021-01-11 LAB — RESP PANEL BY RT-PCR (FLU A&B, COVID) ARPGX2
Influenza A by PCR: NEGATIVE
Influenza B by PCR: NEGATIVE
SARS Coronavirus 2 by RT PCR: NEGATIVE

## 2021-01-11 MED ORDER — FENTANYL CITRATE PF 50 MCG/ML IJ SOSY
12.5000 ug | PREFILLED_SYRINGE | INTRAMUSCULAR | Status: DC | PRN
Start: 2021-01-11 — End: 2021-01-18

## 2021-01-11 MED ORDER — ACETAMINOPHEN 325 MG PO TABS
650.0000 mg | ORAL_TABLET | Freq: Four times a day (QID) | ORAL | Status: DC | PRN
  Administered 2021-01-13 – 2021-01-17 (×3): 650 mg via ORAL
  Filled 2021-01-11 (×3): qty 2

## 2021-01-11 MED ORDER — METHOCARBAMOL 500 MG PO TABS
500.0000 mg | ORAL_TABLET | Freq: Three times a day (TID) | ORAL | Status: DC | PRN
  Administered 2021-01-12 – 2021-01-17 (×4): 500 mg via ORAL
  Filled 2021-01-11 (×5): qty 1

## 2021-01-11 MED ORDER — OXYCODONE-ACETAMINOPHEN 5-325 MG PO TABS
1.0000 | ORAL_TABLET | Freq: Four times a day (QID) | ORAL | Status: DC | PRN
  Administered 2021-01-11: 1 via ORAL
  Filled 2021-01-11: qty 1

## 2021-01-11 MED ORDER — HYDRALAZINE HCL 20 MG/ML IJ SOLN: 5.0000 mg | INTRAMUSCULAR | Status: DC | PRN

## 2021-01-11 MED ORDER — ONDANSETRON HCL 4 MG/2ML IJ SOLN
4.0000 mg | Freq: Three times a day (TID) | INTRAMUSCULAR | Status: DC | PRN
  Administered 2021-01-13: 4 mg via INTRAVENOUS
  Filled 2021-01-11: qty 2

## 2021-01-11 MED ORDER — OXYCODONE-ACETAMINOPHEN 5-325 MG PO TABS
1.0000 | ORAL_TABLET | Freq: Once | ORAL | Status: AC
Start: 2021-01-11 — End: 2021-01-11
  Administered 2021-01-11: 1 via ORAL
  Filled 2021-01-11: qty 1

## 2021-01-11 MED ORDER — LIDOCAINE 5 % EX PTCH
1.0000 | MEDICATED_PATCH | CUTANEOUS | Status: DC
  Administered 2021-01-11 – 2021-01-17 (×7): 1 via TRANSDERMAL
  Filled 2021-01-11 (×8): qty 1

## 2021-01-11 NOTE — ED Triage Notes (Signed)
Daughter reports that pt was in the bathroom pulling up her pull up and fell the bathtub caught her this morning. She can not bear weight on her left ankle. Her Right side of her ribs are hurting her. She has a known fx of her lumbar spine.

## 2021-01-11 NOTE — ED Provider Notes (Signed)
[facility] Emergency Department Provider Note  ____________________________________________   Event Date/Time   First MD Initiated Contact with Patient 01/11/21 1256     (approximate)  I have reviewed the triage vital signs and the nursing notes.   HISTORY  Chief Complaint Ankle Pain   HPI Caroline Aguilar is a 87 y.o. female with a past medical history of HDL, CHF and A. fib on Eliquis and amiodarone as well as recurrent falls and chronic pain in the right knee with known recently sustained L3 compression fracture who presents for assessment after a fall that occurred on 9/24 when patient states he was getting out of bathtub and caught on her underwear.  Patient states she think she hit her head but does not think she passed out although is not sure.  She states right now she is hurting in her right chest and left ankle.  She is accompanied by her daughter.  Patient states that she has not had any vision changes, vertigo, chest pain, cough, shortness of breath, abdominal pain, vomiting, diarrhea fevers rash or other acute sick symptoms other than some subacute pain in her low back from recent diagnosed compression fracture.  She was seen at orthopedist office by Dr. Sabra Heck earlier today and referred to emergency room.         Past Medical History:  Diagnosis Date   A-fib [facility])    Angina pectoris ([place])    Cancer ([place])    Colon cancer   Falls frequently    GERD (gastroesophageal reflux disease)    Hyperlipidemia    Neuropathy     Patient Active Problem List   Diagnosis Date Noted   Trauma 01/31/2020   Fall 01/31/2020   Contusion of face 01/31/2020   Stroke ([place]) 06/11/2017   HLD (hyperlipidemia) 06/11/2017   GERD (gastroesophageal reflux disease) 06/11/2017   (HFpEF) heart failure with preserved ejection fraction ([place]) 06/11/2017   Thigh hematoma 09/02/2016   Dyspnea 07/15/2015   Chest pain at rest 07/15/2015   Community acquired pneumonia  07/02/2015   Hyponatremia 07/02/2015   AF (paroxysmal atrial fibrillation) ([place]) 07/02/2015    Past Surgical History:  Procedure Laterality Date   BONE EXCISION Right 11/14/2019   Procedure: arthroplasty right foot fifth toe;  Surgeon: Sharlotte Alamo, DPM;  Location: [facility] ORS;  Service: Podiatry;  Laterality: Right;   CARDIAC CATHETERIZATION N/A 07/19/2015   Procedure: Right and Left Heart Cath;  Surgeon: Isaias Cowman, MD;  Location: [place] CV LAB;  Service: Cardiovascular;  Laterality: N/A;   CATARACT EXTRACTION Bilateral    COLON SURGERY     ESOPHAGOGASTRODUODENOSCOPY N/A 07/08/2015   Procedure: ESOPHAGOGASTRODUODENOSCOPY (EGD);  Surgeon: Hulen Luster, MD;  Location: [facility] ENDOSCOPY;  Service: Endoscopy;  Laterality: N/A;   FEMUR FRACTURE SURGERY     FOREARM SURGERY     HIP SURGERY Bilateral    KYPHOPLASTY N/A 06/11/2018   Procedure: KYPHOPLASTY L4;  Surgeon: Hessie Knows, MD;  Location: [facility] ORS;  Service: Orthopedics;  Laterality: N/A;   KYPHOPLASTY N/A 12/17/2019   Procedure: L1 Kyphoplasty;  Surgeon: Hessie Knows, MD;  Location: [facility] ORS;  Service: Orthopedics;  Laterality: N/A;   VALVE REPLACEMENT  2017   @ [facility]    Prior to Admission medications   Medication Sig Start Date End Date Taking? Authorizing Provider  acetaminophen (TYLENOL) 325 MG tablet Take 650 mg by mouth every 6 (six) hours as needed for moderate pain.     [provider]  amiodarone (PACERONE)  200 MG tablet Take 1 tablet (200 mg total) by mouth daily. 07/20/15   [name], MD  apixaban (ELIQUIS) 2.5 MG TABS tablet Take 2.5 mg by mouth 2 (two) times daily.     [provider]  buPROPion (WELLBUTRIN XL) 150 MG 24 hr tablet Take 150 mg by mouth every morning.  10/20/19   [provider]  Calcium Carbonate-Vitamin D (CALCIUM 600+D) 600-400 MG-UNIT tablet Take 1 tablet by mouth daily.     [provider]  docusate sodium (COLACE) 100 MG capsule Take 100 mg by mouth daily.      [provider]  gabapentin (NEURONTIN) 100 MG capsule Take 100 mg by mouth at bedtime. 10/20/19   [provider]  latanoprost (XALATAN) 0.005 % ophthalmic solution Place 1 drop into both eyes at bedtime.     [provider]  omeprazole (PRILOSEC) 20 MG capsule Take 20 mg by mouth at bedtime.     [provider]  sertraline (ZOLOFT) 50 MG tablet Take 50 mg by mouth every morning.     [provider]  simvastatin (ZOCOR) 20 MG tablet Take 20 mg by mouth daily at 6 PM.  05/18/18   [provider]  timolol (TIMOPTIC) 0.5 % ophthalmic solution Place 1 drop into both eyes 2 (two) times daily.    [provider]    Allergies Penicillins, Penicillins, Azithromycin, and Famciclovir  Family History  Problem Relation Age of Onset   Cancer Mother    Heart failure Father     Social History Social History   Tobacco Use   Smoking status: Former    Years: 3.00    Types: Cigarettes    Quit date: 11/09/1945    Years since quitting: 75.2   Smokeless tobacco: Never  Vaping Use   Vaping Use: Never used  Substance Use Topics   Alcohol use: No   Drug use: No    Review of Systems  Review of Systems  Constitutional:  Negative for chills and fever.  HENT:  Negative for sore throat.   Eyes:  Negative for pain.  Respiratory:  Negative for cough and stridor.   Cardiovascular:  Positive for chest pain (R chest where she hit the bathtube).  Gastrointestinal:  Negative for vomiting.  Genitourinary:  Negative for dysuria.  Musculoskeletal:  Positive for back pain (subacute from other falls, not sure if worse today), falls and joint pain (L ankle).  Skin:  Negative for rash.  Neurological:  Negative for seizures, loss of consciousness and headaches.  Psychiatric/Behavioral:  Negative for suicidal ideas.   All other systems reviewed and are negative.    ____________________________________________   PHYSICAL EXAM:  VITAL SIGNS: ED  Triage Vitals  Enc Vitals Group     BP 01/11/21 1221 (!) 155/74     Pulse Rate 01/11/21 1221 78     Resp 01/11/21 1222 18     Temp 01/11/21 1221 98.5 F (36.9 C)     Temp Source 01/11/21 1221 Oral     SpO2 01/11/21 1221 94 %     Weight 01/11/21 1241 167 lb (75.8 kg)     Height 01/11/21 1241 5' (1.524 m)     Head Circumference --      Peak Flow --      Pain Score 01/11/21 1241 10     Pain Loc --      Pain Edu? --      Excl. in [place]? --    Vitals:  01/11/21 1221 01/11/21 1222  BP: (!) 155/74   Pulse: 78   Resp:  18  Temp: 98.5 F (36.9 C)   SpO2: 94%    Physical Exam Vitals and nursing note reviewed.  Constitutional:      General: She is not in acute distress.    Appearance: She is well-developed.  HENT:     Head: Normocephalic.     Right Ear: External ear normal.     Left Ear: External ear normal.     Nose: Nose normal.  Eyes:     Conjunctiva/sclera: Conjunctivae normal.  Cardiovascular:     Rate and Rhythm: Normal rate and regular rhythm.     Heart sounds: No murmur heard. Pulmonary:     Effort: Pulmonary effort is normal. No respiratory distress.     Breath sounds: Normal breath sounds.  Abdominal:     Palpations: Abdomen is soft.     Tenderness: There is no abdominal tenderness.  Musculoskeletal:     Cervical back: Neck supple.  Skin:    General: Skin is warm and dry.     Capillary Refill: Capillary refill takes less than 2 seconds.  Neurological:     Mental Status: She is alert. Mental status is at baseline.     Comments: Oriented to year, location but not month or day (baseline per daughter)    Psychiatric:        Mood and Affect: Mood normal.    There is some tenderness over the T and L-spine.  No tenderness over the C-spine.  Cranial nerves II through XII are grossly intact.  Patient is alert and oriented.  She has full strength on her bilateral upper extremities and throughout the right lower extremity and left lower extremity with exception of the  ankle which she has limited range of motion at.  There is some edema and tenderness over the lateral annual aspects.  2+ radial and DP pulses.  Sensation is intact light touch throughout all extremities.  Abdomen is soft nontender throughout although there is some tenderness over the right chest where there is ecchymosis.  ____________________________________________   LABS (all labs ordered are listed, but only abnormal results are displayed)  Labs Reviewed  CBC WITH DIFFERENTIAL/PLATELET - Abnormal; Notable for the following components:      Result Value   Platelets 147 (*)    All other components within normal limits  BASIC METABOLIC PANEL - Abnormal; Notable for the following components:   Chloride 97 (*)    Glucose, Bld 114 (*)    Calcium 8.8 (*)    All other components within normal limits  RESP PANEL BY RT-PCR (FLU A&B, COVID) [id number]   ____________________________________________  EKG  ____________________________________________  RADIOLOGY  ED MD interpretation: CT head and C-spine show no evidence of skull fracture, intracranial hemorrhage or acute C-spine injury.  Plain film of the T-spine shows no acute thoracic fracture.  Plain film of the L-spine shows previous kyphoplasty and old compression fractures but no new acute fractures.  Right-sided chest series shows what appears to be at least 2 nondisplaced right-sided rib fractures Assessment.  No pneumothorax or effusion.  Plain film left ankle shows no acute fracture or dislocation.    Official radiology report(s): DG Ribs Unilateral W/Chest Right  Result Date: 01/11/2021 CLINICAL DATA:  [place]. Right rib pain. EXAM: RIGHT RIBS AND CHEST - 3+ VIEW COMPARISON:  Chest CT 06/30/2020 FINDINGS: The cardiac silhouette, mediastinal hilar contours are within normal limits given the patient's age  and supine position. Stable tortuosity and calcification of the thoracic aorta. Stable surgical changes from TAVR. No acute  pulmonary findings. No pleural effusions or pneumothorax. Dedicated views of the right ribs demonstrate suspected acute and chronic rib fractures. No pleural thickening, pleural effusion or pneumothorax. IMPRESSION: 1. Suspect acute and chronic right rib fractures and chronic left rib fractures. 2. No acute cardiopulmonary findings. Electronically Signed   By: Marijo Sanes M.D.   On: 01/11/2021 14:33   DG Thoracic Spine 2 View  Result Date: 01/11/2021 CLINICAL DATA:  Fall. EXAM: THORACIC SPINE 2 VIEWS COMPARISON:  November 04, 2019.  January 06, 2021. FINDINGS: Mild dextroscoliosis of thoracic spine is noted. No spondylolisthesis is noted. No acute fracture is noted. Anterior osteophyte formation is noted at multiple levels of the lower thoracic spine. IMPRESSION: Degenerative changes as described above. No acute abnormality seen in the thoracic spine. Electronically Signed   By: Marijo Conception M.D.   On: 01/11/2021 14:29   DG Lumbar Spine Complete  Result Date: 01/11/2021 CLINICAL DATA:  Fall. EXAM: LUMBAR SPINE - COMPLETE 4+ VIEW COMPARISON:  January 06, 2021. FINDINGS: Status post kyphoplasty of L1, L2 and L4 levels. Old severe compression fractures are noted at L1 and L2. No acute fracture or spondylolisthesis is noted. Degenerative changes are noted at multiple levels. IMPRESSION: No acute abnormality is noted. Electronically Signed   By: Marijo Conception M.D.   On: 01/11/2021 14:30   DG Ankle 2 Views Left  Result Date: 01/11/2021 CLINICAL DATA:  Left ankle pain after fall. EXAM: LEFT ANKLE - 2 VIEW COMPARISON:  None. FINDINGS: There is no evidence of fracture, dislocation, or joint effusion. There is no evidence of arthropathy or other focal bone abnormality. Soft tissues are unremarkable. IMPRESSION: Negative. Electronically Signed   By: Marijo Conception M.D.   On: 01/11/2021 14:25   CT HEAD WO CONTRAST (5MM)  Result Date: 01/11/2021 CLINICAL DATA:  87 year old female with history of trauma to  the head and neck after falling in the bathtub. EXAM: CT HEAD WITHOUT CONTRAST CT CERVICAL SPINE WITHOUT CONTRAST TECHNIQUE: Multidetector CT imaging of the head and cervical spine was performed following the standard protocol without intravenous contrast. Multiplanar CT image reconstructions of the cervical spine were also generated. COMPARISON:  Head and cervical spine CT 12/30/2020. FINDINGS: CT HEAD FINDINGS Brain: Mild cerebral atrophy. Patchy and confluent areas of decreased attenuation are noted throughout the deep and periventricular white matter of the cerebral hemispheres bilaterally, compatible with chronic microvascular ischemic disease. Physiologic calcifications in the basal ganglia bilaterally. No evidence of acute infarction, hemorrhage, hydrocephalus, extra-axial collection or mass lesion/mass effect. Vascular: No hyperdense vessel or unexpected calcification. Skull: Normal. Negative for fracture or focal lesion. Sinuses/Orbits: No acute finding. Other: None. CT CERVICAL SPINE FINDINGS Alignment: Reversal of normal cervical lordosis centered at the level of C3, likely chronic. Alignment is otherwise anatomic. Skull base and vertebrae: No acute fracture. No primary bone lesion or focal pathologic process. Soft tissues and spinal canal: No prevertebral fluid or swelling. No visible canal hematoma. Disc levels: Multilevel degenerative disc disease, most evident at C5-C6 and C6-C7. Moderate multilevel facet arthropathy bilaterally. Upper chest: Scarring in the lung apices. Other: None. IMPRESSION: 1. No evidence of significant acute traumatic injury to the skull, brain or cervical spine. 2. Mild cerebral atrophy with extensive chronic microvascular ischemic changes in the cerebral white matter, as above. 3. Multilevel degenerative disc disease and cervical spondylosis, as above. Electronically Signed   By: Quillian Quince  Entrikin M.D.   On: 01/11/2021 14:50   CT Cervical Spine Wo Contrast  Result Date:  01/11/2021 CLINICAL DATA:  87 year old female with history of trauma to the head and neck after falling in the bathtub. EXAM: CT HEAD WITHOUT CONTRAST CT CERVICAL SPINE WITHOUT CONTRAST TECHNIQUE: Multidetector CT imaging of the head and cervical spine was performed following the standard protocol without intravenous contrast. Multiplanar CT image reconstructions of the cervical spine were also generated. COMPARISON:  Head and cervical spine CT 12/30/2020. FINDINGS: CT HEAD FINDINGS Brain: Mild cerebral atrophy. Patchy and confluent areas of decreased attenuation are noted throughout the deep and periventricular white matter of the cerebral hemispheres bilaterally, compatible with chronic microvascular ischemic disease. Physiologic calcifications in the basal ganglia bilaterally. No evidence of acute infarction, hemorrhage, hydrocephalus, extra-axial collection or mass lesion/mass effect. Vascular: No hyperdense vessel or unexpected calcification. Skull: Normal. Negative for fracture or focal lesion. Sinuses/Orbits: No acute finding. Other: None. CT CERVICAL SPINE FINDINGS Alignment: Reversal of normal cervical lordosis centered at the level of C3, likely chronic. Alignment is otherwise anatomic. Skull base and vertebrae: No acute fracture. No primary bone lesion or focal pathologic process. Soft tissues and spinal canal: No prevertebral fluid or swelling. No visible canal hematoma. Disc levels: Multilevel degenerative disc disease, most evident at C5-C6 and C6-C7. Moderate multilevel facet arthropathy bilaterally. Upper chest: Scarring in the lung apices. Other: None. IMPRESSION: 1. No evidence of significant acute traumatic injury to the skull, brain or cervical spine. 2. Mild cerebral atrophy with extensive chronic microvascular ischemic changes in the cerebral white matter, as above. 3. Multilevel degenerative disc disease and cervical spondylosis, as above. Electronically Signed   By: Vinnie Langton M.D.    On: 01/11/2021 14:50    ____________________________________________   PROCEDURES  Procedure(s) performed (including Critical Care):  Procedures   ____________________________________________   INITIAL IMPRESSION / ASSESSMENT AND PLAN / ED COURSE      Patient presents with above-stated history exam for assessment of primarily left ankle pain although some possibly acute on chronic low back pain as well after fall described a couple days ago as above.  On arrival she is hypertensive with otherwise stable vital signs on room air.  She does have some edema and tenderness of the lateral and medial ankle as well some tenderness over the T and L-spine.  There is also some ecchymosis and tenderness over the right chest.  No other clear extremity trauma or neurovascular deficit on exam.  Given age, anticoagulated status and patient states she think she probably hit her head CT head and C-spine obtained.  I will also obtain plain film of the right chest and T and L-spine obtained to assess for possible acute on chronic spinal fracture as well as plain film of the ankle.  She does describe a clear mechanical mechanism and I have low suspicion for syncope or seizure at this time as precipitating her fall.  CBC shows no acute anemia or leukocytosis.  Platelets are 147.  BMP shows no significant electrolyte or metabolic derangements.  Per daughter at bedside patient does take Percocet at home for her low back pain.  She was given a dose of this in emergency room as it has been more than 6 hours since her last dose.  CT head and C-spine show no evidence of skull fracture, intracranial hemorrhage or acute C-spine injury.  Plain film of the T-spine shows no acute thoracic fracture.  Plain film of the L-spine shows previous kyphoplasty and old  compression fractures but no new acute fractures.  Right-sided chest series shows what appears to be at least 2 nondisplaced right-sided rib fractures Assessment.   No pneumothorax or effusion.  Plain film left ankle shows no acute fracture or dislocation.  I have a low suspicion for other significant injury at this time given stable vitals, stable hemoglobin patient denying any other acute pain.  I will place left ankle and immobilizing boot with concern for likely ankle sprain.  I will provide her an incentive spirometer given acute right-sided rib fractures.  After discussion with daughter at bedside it seems the patient has already been having significant difficulty and recurrent falls using a walker at home and daughter is very concerned that patient will not be able to ambulate and will require too much care for her daughter provide given new ankle sprain and rib fractures.  I think it is reasonable for her to be admitted for PT OT pain control and likely SNF placement.  We will plan to admit to medicine service for further evaluation and management.      ____________________________________________   FINAL CLINICAL IMPRESSION(S) / ED DIAGNOSES  Final diagnoses:  Sprain of left ankle, unspecified ligament, initial encounter  Suspected fracture of rib, right, closed  Fall, initial encounter    Medications  oxyCODONE-acetaminophen (PERCOCET/ROXICET) 5-325 MG per tablet 1 tablet (1 tablet Oral Given 01/11/21 1315)     ED Discharge Orders     None        Note:  This document was prepared using Dragon voice recognition software and may include unintentional dictation errors.    Lucrezia Starch, MD 01/11/21 [phone]

## 2021-01-11 NOTE — H&P (Signed)
History and Physical    [name] DOB: 16-Oct-1925 DOA: 01/11/2021  Referring MD/NP/PA:   PCP: [name], MD   Patient coming from:  The patient is coming from home.   Chief Complaint: Multiple fall  HPI: Caroline Aguilar is a 87 y.o. female with medical history significant of hypertension, hyperlipidemia, stroke, GERD, depression, colon cancer, atrial fibrillation on Eliquis, neuropathy, dCHF, frequent fall, compression fracture of the spine, who presents with multiple falls.  Per her daughter at the bedside, patient has frequent fall recently. She recently had fall, caused compression fracture of spine on 9/24. She fell again today when she was getting out of bathtub and caught on her underwear. She injured right side of her chest wall, developed bruise over right side of her chest.  She has pain in the right rib cage.  Denies unilateral numbness or tingling to extremities.  No facial droop or slurred speech.  Patient does not have cough, shortness breath, fever or chills.  No nausea, vomiting, diarrhea or abdominal pain.  No symptoms of UTI.  No loss of consciousness. She was seen at orthopedist office by Dr. Sabra Heck earlier today and referred to emergency room.      ED Course: pt was found to have WBC 7.6, pending COVID-19 PCR, electrolytes renal function okay, temperature normal, blood pressure 155/74, heart rate 78, RR 18, oxygen saturation 94% on room air.  CT of head and CT of C-spine negative for acute issues.  Patient has negative x-ray of left ankle, L-spine and T-spine.  X-ray of rib showed bilateral rib fracture.  Patient is placed on MedSurg bed for observation.  Review of Systems:   General: no fevers, chills, no body weight gain, has fatigue HEENT: no blurry vision, hearing changes or sore throat Respiratory: no dyspnea, coughing, wheezing CV: no chest pain, no palpitations GI: no nausea, vomiting, abdominal pain, diarrhea, constipation GU: no  dysuria, burning on urination, increased urinary frequency, hematuria  Ext: no leg edema Neuro: no unilateral weakness, numbness, or tingling, no vision change or hearing loss Skin: no rash, no skin tear.  Has bruises in right side of her chest wall MSK: Has right sided chest wall pain Heme: No easy bruising.  Travel history: No recent long distant travel.  Allergy:  Allergies  Allergen Reactions   Penicillins Anaphylaxis and Other (See Comments)    Has patient had a PCN reaction causing immediate rash, facial/tongue/throat swelling, SOB or lightheadedness with hypotension: Yes Has patient had a PCN reaction causing severe rash involving mucus membranes or skin necrosis: No Has patient had a PCN reaction that required hospitalization No Has patient had a PCN reaction occurring within the last 10 years: No If all of the above answers are "NO", then may proceed with Cephalosporin use.   Penicillins Anaphylaxis   Azithromycin Other (See Comments)    Reaction:  Unknown    Cortisone     Other reaction(s): Hallucination   Famciclovir Other (See Comments)    Reaction:  Unknown    Meloxicam    Morphine    Streptomycin    Tramadol     Other reaction(s): Hallucination   Venlafaxine     Other reaction(s): Dizziness    Past Medical History:  Diagnosis Date   A-fib ([place])    Angina pectoris ([place])    Cancer ([place])    Colon cancer   Falls frequently    GERD (gastroesophageal reflux disease)    Hyperlipidemia    Neuropathy  Past Surgical History:  Procedure Laterality Date   BONE EXCISION Right 11/14/2019   Procedure: arthroplasty right foot fifth toe;  Surgeon: Sharlotte Alamo, DPM;  Location: [facility] ORS;  Service: Podiatry;  Laterality: Right;   CARDIAC CATHETERIZATION N/A 07/19/2015   Procedure: Right and Left Heart Cath;  Surgeon: Isaias Cowman, MD;  Location: [facility];  Service: Cardiovascular;  Laterality: N/A;   CATARACT EXTRACTION Bilateral    COLON SURGERY      ESOPHAGOGASTRODUODENOSCOPY N/A 07/08/2015   Procedure: ESOPHAGOGASTRODUODENOSCOPY (EGD);  Surgeon: Hulen Luster, MD;  Location[facility] END[facility];  Service: Endoscopy;  Laterality: N/A;   FEMUR FRACTURE SURGERY     FOREARM SURGERY     HIP SURGERY Bilateral    KYPHOPLASTY N/A 06/11/2018   Procedure: KYPHOPLASTY L4;  Surgeon: Hessie Knows, MD;  Location: [facility] ORS;  Service: Orthopedics;  Laterality: N/A;   KYPHOPLASTY N/A 12/17/2019   Procedure: L1 Kyphoplasty;  Surgeon: Hessie Knows, MD;  Location: [facility] ORS;  Service: Orthopedics;  Laterality: N/A;   VALVE REPLACEMENT  2017   @ [facility]    Social History:  reports that she quit smoking about 75 years ago. Her smoking use included cigarettes. She has never used smokeless tobacco. She reports that she does not drink alcohol and does not use drugs.  Family History:  Family History  Problem Relation Age of Onset   Cancer Mother    Heart failure Father      Prior to Admission medications   Medication Sig Start Date End Date Taking? Authorizing Provider  acetaminophen (TYLENOL) 325 MG tablet Take 650 mg by mouth every 6 (six) hours as needed for moderate pain.     [provider]  amiodarone (PACERONE) 200 MG tablet Take 1 tablet (200 mg total) by mouth daily. 07/20/15   [name], MD  apixaban (ELIQUIS) 2.5 MG TABS tablet Take 2.5 mg by mouth 2 (two) times daily.     [provider]  buPROPion (WELLBUTRIN XL) 150 MG 24 hr tablet Take 150 mg by mouth every morning.  10/20/19   [provider]  Calcium Carbonate-Vitamin D (CALCIUM 600+D) 600-400 MG-UNIT tablet Take 1 tablet by mouth daily.     [provider]  docusate sodium (COLACE) 100 MG capsule Take 100 mg by mouth daily.     [provider]  gabapentin (NEURONTIN) 100 MG capsule Take 100 mg by mouth at bedtime. 10/20/19   [provider]  latanoprost (XALATAN) 0.005 % ophthalmic solution Place 1 drop into both eyes at bedtime.     [provider]  omeprazole (PRILOSEC) 20 MG capsule Take 20 mg by mouth at bedtime.     [provider]  sertraline (ZOLOFT) 50 MG tablet Take 50 mg by mouth every morning.     [provider]  simvastatin (ZOCOR) 20 MG tablet Take 20 mg by mouth daily at 6 PM.  05/18/18   [provider]  timolol (TIMOPTIC) 0.5 % ophthalmic solution Place 1 drop into both eyes 2 (two) times daily.    [provider]    Physical Exam: Vitals:   01/11/21 1221 01/11/21 1222 01/11/21 1241  BP: (!) 155/74    Pulse: 78    Resp:  18   Temp: 98.5 F (36.9 C)    TempSrc: Oral    SpO2: 94%    Weight:   75.8 kg  Height:   5' (1.524 m)   General: Not in acute distress HEENT:  Eyes: PERRL, EOMI, no scleral icterus.       ENT: No discharge from the ears and nose, no pharynx injection, no tonsillar enlargement.        Neck: No JVD, no bruit, no mass felt. Heme: No neck lymph node enlargement. Cardiac: S1/S2, RRR, No murmurs, No gallops or rubs. Respiratory: No rales, wheezing, rhonchi or rubs. GI: Soft, nondistended, nontender, no rebound pain, no organomegaly, BS present. GU: No hematuria Ext: No pitting leg edema bilaterally. 1+DP/PT pulse bilaterally. Musculoskeletal: Has tenderness in the right side of rib cage Skin: No rashes. Has bruises in right side of her chest wall Neuro: Alert, oriented X3, cranial nerves II-XII grossly intact, moves all extremities normally.  Psych: Patient is not psychotic, no suicidal or hemocidal ideation.  Labs on Admission: I have personally reviewed following labs and imaging studies  CBC: Recent Labs  Lab 01/11/21 1317  WBC 7.6  NEUTROABS 4.8  HGB 13.3  HCT 41.0  MCV 93.2  PLT 130*   Basic Metabolic Panel: Recent Labs  Lab 01/11/21 1317  NA 136  K 4.1  CL 97*  CO2 30  GLUCOSE 114*  BUN 19  CREATININE 0.85  CALCIUM 8.8*   GFR: Estimated Creatinine Clearance: 37.6 mL/min (by C-G formula based on SCr of 0.85  mg/dL). Liver Function Tests: No results for input(s): AST, ALT, ALKPHOS, BILITOT, PROT, ALBUMIN in the last 168 hours. No results for input(s): LIPASE, AMYLASE in the last 168 hours. No results for input(s): AMMONIA in the last 168 hours. Coagulation Profile: No results for input(s): INR, PROTIME in the last 168 hours. Cardiac Enzymes: No results for input(s): CKTOTAL, CKMB, CKMBINDEX, TROPONINI in the last 168 hours. BNP (last 3 results) No results for input(s): PROBNP in the last 8760 hours. HbA1C: No results for input(s): HGBA1C in the last 72 hours. CBG: No results for input(s): GLUCAP in the last 168 hours. Lipid Profile: No results for input(s): CHOL, HDL, LDLCALC, TRIG, CHOLHDL, LDLDIRECT in the last 72 hours. Thyroid Function Tests: No results for input(s): TSH, T4TOTAL, FREET4, T3FREE, THYROIDAB in the last 72 hours. Anemia Panel: No results for input(s): VITAMINB12, FOLATE, FERRITIN, TIBC, IRON, RETICCTPCT in the last 72 hours. Urine analysis:    Component Value Date/Time   COLORURINE YELLOW (A) 02/08/2020 2047   APPEARANCEUR CLEAR (A) 02/08/2020 2047   LABSPEC 1.006 02/08/2020 2047   PHURINE 7.0 02/08/2020 2047   GLUCOSEU NEGATIVE 02/08/2020 2047   HGBUR SMALL (A) 02/08/2020 2047   BILIRUBINUR NEGATIVE 02/08/2020 2047   KETONESUR NEGATIVE 02/08/2020 2047   PROTEINUR NEGATIVE 02/08/2020 2047   NITRITE POSITIVE (A) 02/08/2020 2047   LEUKOCYTESUR NEGATIVE 02/08/2020 2047   Sepsis Labs: @LABRCNTIP (procalcitonin:4,lacticidven:4) )No results found for this or any previous visit (from the past 240 hour(s)).   Radiological Exams on Admission: DG Ribs Unilateral W/Chest Right  Result Date: 01/11/2021 CLINICAL DATA:  [place]. Right rib pain. EXAM: RIGHT RIBS AND CHEST - 3+ VIEW COMPARISON:  Chest CT 06/30/2020 FINDINGS: The cardiac silhouette, mediastinal hilar contours are within normal limits given the patient's age and supine position. Stable tortuosity and calcification  of the thoracic aorta. Stable surgical changes from TAVR. No acute pulmonary findings. No pleural effusions or pneumothorax. Dedicated views of the right ribs demonstrate suspected acute and chronic rib fractures. No pleural thickening, pleural effusion or pneumothorax. IMPRESSION: 1. Suspect acute and chronic right rib fractures and chronic left rib fractures. 2. No acute cardiopulmonary findings. Electronically Signed   By: Ricky Stabs.D.  On: 01/11/2021 14:33   DG Thoracic Spine 2 View  Result Date: 01/11/2021 CLINICAL DATA:  Fall. EXAM: THORACIC SPINE 2 VIEWS COMPARISON:  November 04, 2019.  January 06, 2021. FINDINGS: Mild dextroscoliosis of thoracic spine is noted. No spondylolisthesis is noted. No acute fracture is noted. Anterior osteophyte formation is noted at multiple levels of the lower thoracic spine. IMPRESSION: Degenerative changes as described above. No acute abnormality seen in the thoracic spine. Electronically Signed   By: Marijo Conception M.D.   On: 01/11/2021 14:29   DG Lumbar Spine Complete  Result Date: 01/11/2021 CLINICAL DATA:  Fall. EXAM: LUMBAR SPINE - COMPLETE 4+ VIEW COMPARISON:  January 06, 2021. FINDINGS: Status post kyphoplasty of L1, L2 and L4 levels. Old severe compression fractures are noted at L1 and L2. No acute fracture or spondylolisthesis is noted. Degenerative changes are noted at multiple levels. IMPRESSION: No acute abnormality is noted. Electronically Signed   By: Marijo Conception M.D.   On: 01/11/2021 14:30   DG Ankle 2 Views Left  Result Date: 01/11/2021 CLINICAL DATA:  Left ankle pain after fall. EXAM: LEFT ANKLE - 2 VIEW COMPARISON:  None. FINDINGS: There is no evidence of fracture, dislocation, or joint effusion. There is no evidence of arthropathy or other focal bone abnormality. Soft tissues are unremarkable. IMPRESSION: Negative. Electronically Signed   By: Marijo Conception M.D.   On: 01/11/2021 14:25   CT HEAD WO CONTRAST (5MM)  Result Date:  01/11/2021 CLINICAL DATA:  87 year old female with history of trauma to the head and neck after falling in the bathtub. EXAM: CT HEAD WITHOUT CONTRAST CT CERVICAL SPINE WITHOUT CONTRAST TECHNIQUE: Multidetector CT imaging of the head and cervical spine was performed following the standard protocol without intravenous contrast. Multiplanar CT image reconstructions of the cervical spine were also generated. COMPARISON:  Head and cervical spine CT 12/30/2020. FINDINGS: CT HEAD FINDINGS Brain: Mild cerebral atrophy. Patchy and confluent areas of decreased attenuation are noted throughout the deep and periventricular white matter of the cerebral hemispheres bilaterally, compatible with chronic microvascular ischemic disease. Physiologic calcifications in the basal ganglia bilaterally. No evidence of acute infarction, hemorrhage, hydrocephalus, extra-axial collection or mass lesion/mass effect. Vascular: No hyperdense vessel or unexpected calcification. Skull: Normal. Negative for fracture or focal lesion. Sinuses/Orbits: No acute finding. Other: None. CT CERVICAL SPINE FINDINGS Alignment: Reversal of normal cervical lordosis centered at the level of C3, likely chronic. Alignment is otherwise anatomic. Skull base and vertebrae: No acute fracture. No primary bone lesion or focal pathologic process. Soft tissues and spinal canal: No prevertebral fluid or swelling. No visible canal hematoma. Disc levels: Multilevel degenerative disc disease, most evident at C5-C6 and C6-C7. Moderate multilevel facet arthropathy bilaterally. Upper chest: Scarring in the lung apices. Other: None. IMPRESSION: 1. No evidence of significant acute traumatic injury to the skull, brain or cervical spine. 2. Mild cerebral atrophy with extensive chronic microvascular ischemic changes in the cerebral white matter, as above. 3. Multilevel degenerative disc disease and cervical spondylosis, as above. Electronically Signed   By: Vinnie Langton M.D.    On: 01/11/2021 14:50   CT Cervical Spine Wo Contrast  Result Date: 01/11/2021 CLINICAL DATA:  87 year old female with history of trauma to the head and neck after falling in the bathtub. EXAM: CT HEAD WITHOUT CONTRAST CT CERVICAL SPINE WITHOUT CONTRAST TECHNIQUE: Multidetector CT imaging of the head and cervical spine was performed following the standard protocol without intravenous contrast. Multiplanar CT image reconstructions of the cervical spine  were also generated. COMPARISON:  Head and cervical spine CT 12/30/2020. FINDINGS: CT HEAD FINDINGS Brain: Mild cerebral atrophy. Patchy and confluent areas of decreased attenuation are noted throughout the deep and periventricular white matter of the cerebral hemispheres bilaterally, compatible with chronic microvascular ischemic disease. Physiologic calcifications in the basal ganglia bilaterally. No evidence of acute infarction, hemorrhage, hydrocephalus, extra-axial collection or mass lesion/mass effect. Vascular: No hyperdense vessel or unexpected calcification. Skull: Normal. Negative for fracture or focal lesion. Sinuses/Orbits: No acute finding. Other: None. CT CERVICAL SPINE FINDINGS Alignment: Reversal of normal cervical lordosis centered at the level of C3, likely chronic. Alignment is otherwise anatomic. Skull base and vertebrae: No acute fracture. No primary bone lesion or focal pathologic process. Soft tissues and spinal canal: No prevertebral fluid or swelling. No visible canal hematoma. Disc levels: Multilevel degenerative disc disease, most evident at C5-C6 and C6-C7. Moderate multilevel facet arthropathy bilaterally. Upper chest: Scarring in the lung apices. Other: None. IMPRESSION: 1. No evidence of significant acute traumatic injury to the skull, brain or cervical spine. 2. Mild cerebral atrophy with extensive chronic microvascular ischemic changes in the cerebral white matter, as above. 3. Multilevel degenerative disc disease and cervical  spondylosis, as above. Electronically Signed   By: Vinnie Langton M.D.   On: 01/11/2021 14:50     EKG: done in ED, will get one.   Assessment/Plan Principal Problem:   Rib fracture Active Problems:   AF (paroxysmal atrial fibrillation) (HCC)   Stroke (HCC)   HLD (hyperlipidemia)   (HFpEF) heart failure with preserved ejection fraction ([place])   Fall   Depression   Compression fracture of spine (HCC)   HTN (hypertension)   Rib fracture: X-ray of rib findings are suspecting acute and chronic right rib fractures and chronic left rib fractures.  Due to frequent fall, it is difficult to take care of patient at home, patient will need placement -Placed on MedSurg bed for observation -Incentive spirometry -As needed Percocet, fentanyl for pain -As needed Robaxin -Consult transitional care team for placement of rehab -PT/OT  AF (paroxysmal atrial fibrillation) ([place]): Heart rate 78 -Continue Eliquis -Patient is not taking amiodarone currently  Stroke ([place]) -On Eliquis for A. Fib -Zocor  HLD (hyperlipidemia) -Zocor  (HFpEF) heart failure with preserved ejection fraction, chronic: 2D echo on 06/12/2017 showed EF of 55 to 60%.  Patient does not have leg edema or JVD.  CHF seem to be compensated.  Patient not taking Lasix. -Watch volume status closely  Fall -PT/OT  Depression -Continue home medications  Compression fracture of spine (HCC) -Pain control as above  HTN (hypertension) -Amlodipine -As needed IV hydralazine    DVT ppx: on Eliquis Code Status: Full code Family Communication: Yes, patient's daughter at bed side Disposition Plan:  Anticipate discharge back to previous environment Consults called:  none Admission status and Level of care: Med-Surg:   for obs     Status is: Observation  The patient remains OBS appropriate and will d/c before 2 midnights.  Dispo: The patient is from: Home              Anticipated d/c is to: SNF              Patient  currently is not medically stable to d/c.   Difficult to place patient No           Date of Service 01/11/2021    [facility]   If 7PM-7AM, please contact night-coverage [email or web address] 01/11/2021, 4:52 PM

## 2021-01-12 ENCOUNTER — Encounter: Payer: Self-pay | Admitting: Internal Medicine

## 2021-01-12 DIAGNOSIS — I480 Paroxysmal atrial fibrillation: Secondary | ICD-10-CM | POA: Diagnosis not present

## 2021-01-12 DIAGNOSIS — S2243XA Multiple fractures of ribs, bilateral, initial encounter for closed fracture: Secondary | ICD-10-CM | POA: Diagnosis not present

## 2021-01-12 DIAGNOSIS — W19XXXA Unspecified fall, initial encounter: Secondary | ICD-10-CM | POA: Diagnosis not present

## 2021-01-12 DIAGNOSIS — I5032 Chronic diastolic (congestive) heart failure: Secondary | ICD-10-CM | POA: Diagnosis not present

## 2021-01-12 MED ORDER — APIXABAN 2.5 MG PO TABS
2.5000 mg | ORAL_TABLET | Freq: Two times a day (BID) | ORAL | Status: DC
  Administered 2021-01-12 – 2021-01-18 (×13): 2.5 mg via ORAL
  Filled 2021-01-12 (×14): qty 1

## 2021-01-12 MED ORDER — TIMOLOL MALEATE 0.5 % OP SOLN
1.0000 [drp] | Freq: Two times a day (BID) | OPHTHALMIC | Status: DC
  Administered 2021-01-12 – 2021-01-18 (×13): 1 [drp] via OPHTHALMIC
  Filled 2021-01-12: qty 5

## 2021-01-12 MED ORDER — DOCUSATE SODIUM 100 MG PO CAPS
100.0000 mg | ORAL_CAPSULE | Freq: Every day | ORAL | Status: DC
  Administered 2021-01-12 – 2021-01-18 (×6): 100 mg via ORAL
  Filled 2021-01-12 (×6): qty 1

## 2021-01-12 MED ORDER — AMLODIPINE BESYLATE 5 MG PO TABS
2.5000 mg | ORAL_TABLET | Freq: Two times a day (BID) | ORAL | Status: DC
  Administered 2021-01-12 – 2021-01-18 (×13): 2.5 mg via ORAL
  Filled 2021-01-12 (×13): qty 1

## 2021-01-12 MED ORDER — PANTOPRAZOLE SODIUM 40 MG PO TBEC
40.0000 mg | DELAYED_RELEASE_TABLET | Freq: Every day | ORAL | Status: DC
  Administered 2021-01-12 – 2021-01-18 (×7): 40 mg via ORAL
  Filled 2021-01-12 (×7): qty 1

## 2021-01-12 MED ORDER — LATANOPROST 0.005 % OP SOLN
1.0000 [drp] | Freq: Every day | OPHTHALMIC | Status: DC
  Administered 2021-01-12 – 2021-01-17 (×6): 1 [drp] via OPHTHALMIC
  Filled 2021-01-12: qty 2.5

## 2021-01-12 MED ORDER — SERTRALINE HCL 50 MG PO TABS
50.0000 mg | ORAL_TABLET | ORAL | Status: DC
  Administered 2021-01-12 – 2021-01-18 (×7): 50 mg via ORAL
  Filled 2021-01-12 (×7): qty 1

## 2021-01-12 MED ORDER — BUPROPION HCL ER (XL) 150 MG PO TB24
150.0000 mg | ORAL_TABLET | ORAL | Status: DC
  Administered 2021-01-12 – 2021-01-18 (×7): 150 mg via ORAL
  Filled 2021-01-12 (×7): qty 1

## 2021-01-12 MED ORDER — OXYCODONE-ACETAMINOPHEN 5-325 MG PO TABS
0.5000 | ORAL_TABLET | Freq: Four times a day (QID) | ORAL | Status: DC | PRN
  Administered 2021-01-12 – 2021-01-13 (×3): 0.5 via ORAL
  Filled 2021-01-12 (×4): qty 1

## 2021-01-12 MED ORDER — GABAPENTIN 100 MG PO CAPS
100.0000 mg | ORAL_CAPSULE | Freq: Every day | ORAL | Status: DC
  Administered 2021-01-12 – 2021-01-17 (×6): 100 mg via ORAL
  Filled 2021-01-12 (×6): qty 1

## 2021-01-12 MED ORDER — SIMVASTATIN 20 MG PO TABS
20.0000 mg | ORAL_TABLET | Freq: Every day | ORAL | Status: DC
  Administered 2021-01-12 – 2021-01-17 (×6): 20 mg via ORAL
  Filled 2021-01-12: qty 1
  Filled 2021-01-12: qty 2
  Filled 2021-01-12 (×4): qty 1

## 2021-01-12 NOTE — Progress Notes (Signed)
Patient seen in ER.  Full consult to follow. L1 fracture is stable with prior kyphoplasty.  Suspect stenosis is more of the problem. Will review MRI, current and prior, to determine if any intervention is likely to be beneficial.   \Subsequent review of prior MRIs shows the L1 fracture still has some superior endplate edema but is stable.  I doubt this is the cause of most of her pain as her pain seems to be lower.  Further review these MRIs did not does not reveal any evidence of a sacral insufficiency fracture.  There is severe L4-5 lumbar stenosis as well as extensive facet arthritis.  My recommendation would be for pain clinic for epidural steroid if she can be off Eliquis for a day or 2.

## 2021-01-12 NOTE — Evaluation (Signed)
Occupational Therapy Evaluation Patient Details Name: Caroline Aguilar MRN: [id number] DOB: 05-May-1925 Today's Date: 01/12/2021   History of Present Illness Pt is a 87 y.o. female with medical history significant of hypertension, hyperlipidemia, stroke, GERD, depression, colon cancer, atrial fibrillation on Eliquis, neuropathy, dCHF, frequent fall, compression fracture of the spine, who presents with multiple falls.  MD assessment includes: acute and chronic right rib fractures and chronic left rib fractures, L ankle sprain, and falls.  Pt/family also endorse recent diagnosis of lumbar compression fracture.   Clinical Impression   Pt seen for OT evaluation this date in setting of acute hospitalization d/t fall and rib fx. Pt reports living home alone with 4 STE and is MOD I with RW for fxl mobility. Pt presents this date with decreased fxl activity tolerance 2/2 pain superimposed on some baseline weakness, preventing her from safely completing ADLs/ADL mobility. Pt requires MOD A for bed mobility and MOD/MAX A for SPS transfer with arm in arm assist. Pt requires MAX A for LB ADLs at this time 2/2 pain and requires at least SETUP to MIN A for seated UB ADLs. Pt very pain limited at this time and perseverates on her back pain most. She states she experiences numbness in her lower extremities at times and wonders if this is r/t her back. She appears to demo some knee buckling with transfer to [facility] with OT this date as well. Overall, pt not safe to manage at home. Anticipate she will require STR to improve strength and tolerance as well as educate re: modifications d/t pain. Will continue to follow acutely.     Recommendations for follow up therapy are one component of a multi-disciplinary discharge planning process, led by the attending physician.  Recommendations may be updated based on patient status, additional functional criteria and insurance authorization.   Follow Up Recommendations  SNF     Equipment Recommendations  3 in 1 bedside commode;Tub/shower seat    Recommendations for Other Services       Precautions / Restrictions Precautions Precautions: Fall Required Braces or Orthoses: Other Brace Other Brace: Cam boot to L ankle Restrictions Weight Bearing Restrictions: No      Mobility Bed Mobility Overal bed mobility: Needs Assistance Bed Mobility: Sidelying to Sit   Sidelying to sit: Mod assist;HOB elevated       General bed mobility comments: increased time, assist for trunk    Transfers Overall transfer level: Needs assistance Equipment used: 1 person hand held assist Transfers: Sit to/from [employer] Transfers Sit to Stand: Mod assist;From elevated surface Stand pivot transfers: Mod assist;Max assist;From elevated surface       General transfer comment: MOD cues for safety and calming. Pt fearful and starts to almost quit halfway.    Balance Overall balance assessment: Needs assistance;History of Falls   Sitting balance-Leahy Scale: Fair     Standing balance support: Bilateral upper extremity supported;During functional activity Standing balance-Leahy Scale: Poor Standing balance comment: Pt requires extensive UE Support to sustain static stand                           ADL either performed or assessed with clinical judgement   ADL                                         General ADL Comments: Pt requires SETUP for  seated UB ADLs, MAX A for seated LB ADLs, MOD/MAX A for SPS with arm in arm from EOB to [facility].     Vision Patient Visual Report: No change from baseline       Perception     Praxis      Pertinent Vitals/Pain Pain Assessment: 0-10 Pain Score: 9  Pain Location: General back, RLE, R side, and L ankle pain Pain Descriptors / Indicators: Grimacing;Moaning;Aching;Sore Pain Intervention(s): Limited activity within patient's tolerance;Monitored during session;Other (comment) (notified RN)      Hand Dominance     Extremity/Trunk Assessment Upper Extremity Assessment Upper Extremity Assessment: Generalized weakness   Lower Extremity Assessment Lower Extremity Assessment: Generalized weakness (noted knee limitations with transfers, feels as though L knee might be buckling.)       Communication Communication Communication: No difficulties   Cognition Arousal/Alertness: Awake/alert Behavior During Therapy: WFL for tasks assessed/performed Overall Cognitive Status: Difficult to assess                                 General Comments: Pt is appropriate with following commands and appropriate conversationally. She does require some increasd processing time. She is basically oriented, but lacks some insight, stating "if they can't fix my back, then I'll just go home" despite not being able to get up on her own   General Comments       Exercises Other Exercises Other Exercises: OT ed with pt re: safety with bed mobility and ADLs. Pt with moderate reception.   Shoulder Instructions      Home Living Family/patient expects to be discharged to:: Private residence Living Arrangements: Alone Available Help at Discharge: Family;Available 24 hours/day;Personal care attendant (has PCA that helps some, she doesn't specify.) Type of Home: House Home Access: Stairs to enter [employer] of Steps: 4 Entrance Stairs-Rails: Right Home Layout: One level     Bathroom Shower/Tub: Teacher, early years/pre: Handicapped height (BSC over top)     Home Equipment: Walker - 2 wheels;Bedside commode;Tub bench;Cane - single point;Other (comment)   Additional Comments: Lift chair      Prior Functioning/Environment Level of Independence: Needs assistance  Gait / Transfers Assistance Needed: Mod Ind amb with a RW in the home, SBA/CGA with stairs, 3 falls in the last 6 months secondary to LOB ADL's / Homemaking Assistance Needed: States she has some  family and aide help for IADLs            OT Problem List: Decreased strength;Decreased activity tolerance;Impaired balance (sitting and/or standing);Decreased knowledge of use of DME or AE;Pain      OT Treatment/Interventions: Self-care/ADL training;Therapeutic exercise;DME and/or AE instruction;Patient/family education;Balance training;Therapeutic activities    OT Goals(Current goals can be found in the care plan section) Acute Rehab OT Goals Patient Stated Goal: to address back pain and fall less. OT Goal Formulation: With patient Time For Goal Achievement: 01/26/21 Potential to Achieve Goals: Good ADL Goals Pt Will Perform Lower Body Dressing: with min assist;with mod assist;sitting/lateral leans;with adaptive equipment Pt Will Transfer to Toilet: with min guard assist;stand pivot transfer;bedside commode Pt Will Perform Toileting - Clothing Manipulation and hygiene: with min guard assist;sitting/lateral leans  OT Frequency: Min 1X/week   Barriers to D/C: Decreased caregiver support          Co-evaluation              AM-PAC OT "6 Clicks" Daily Activity  Outcome Measure Help from another person eating meals?: None Help from another person taking care of personal grooming?: A Little Help from another person toileting, which includes using toliet, bedpan, or urinal?: A Lot Help from another person bathing (including washing, rinsing, drying)?: A Lot Help from another person to put on and taking off regular upper body clothing?: A Little Help from another person to put on and taking off regular lower body clothing?: A Lot 6 Click Score: 16   End of Session Equipment Utilized During Treatment: Gait belt;Rolling walker Nurse Communication: Mobility status  Activity Tolerance: Patient tolerated treatment well Patient left: Other (comment);with nursing/sitter in room;with call bell/phone within reach (on BSC attempting to have BM)  OT Visit Diagnosis: Unsteadiness  on feet (R26.81);Muscle weakness (generalized) (M62.81);History of falling (Z91.81);Repeated falls (R29.6);Pain Pain - part of body:  (back)                Time: 7591-6384 OT Time Calculation (min): 20 min Charges:  OT General Charges $OT Visit: 1 Visit OT Evaluation $OT Eval Moderate Complexity: 1 Mod OT Treatments $Self Care/Home Management : 8-22 mins  Gerrianne Scale, MS, OTR/L ascom [phone] 01/12/21, 5:42 PM

## 2021-01-12 NOTE — Progress Notes (Signed)
PROGRESS NOTE    Caroline Aguilar  IPJ:[id number] DOB: 09/14/1925 DOA: 01/11/2021 PCP: [name], MD    Brief Narrative:  87 year old female with a history of hypertension, hyperlipidemia, prior stroke, atrial fibrillation on anticoagulation, diastolic heart failure, admitted to the hospital with multiple falls.  She was noted to have acute right-sided rib fractures.  She also complained of back pain and recent imaging showed acute on chronic lumbar compression fracture.  She was admitted for pain management.  Orthopedics consulted to see if she would be a candidate for kyphoplasty.  Seen by PT with recommendation for skilled nursing facility placement.   Assessment & Plan:   Principal Problem:   Rib fracture Active Problems:   AF (paroxysmal atrial fibrillation) (HCC)   Stroke (HCC)   HLD (hyperlipidemia)   (HFpEF) heart failure with preserved ejection fraction ([place])   Fall   Depression   Compression fracture of spine (HCC)   HTN (hypertension)   Right-sided rib fractures status post fall -Continue supportive management with pain management -Incentive spirometry -Physical therapy  Back pain with lumbar compression fractures status post fall -History of the same where she has undergone kyphoplasty and multiple lumbar vertebra -Recent MRI from 9/22 shows acute lumbar compression fracture of L1 vertebra, although it is also commented that she has had kyphoplasty at L1 in the past -We will consult orthopedics, Dr. Rudene Christians regarding any further management  Paroxysmal atrial fibrillation -Heart rate is currently stable -She is anticoagulated with apixaban  Hypertension -Continue on amlodipine  Chronic diastolic congestive heart failure -Appears to be compensated -Continue current management  Recurrent falls -Seen by physical therapy with recommendation for skilled nursing facility placement -TOC assistance requested   DVT prophylaxis: apixaban (ELIQUIS) tablet 2.5 mg  Start: 01/12/21 0200 apixaban (ELIQUIS) tablet 2.5 mg  Code Status: Full code Family Communication: Discussed with daughter at the bedside Disposition Plan: Status is: Observation  The patient remains OBS appropriate and will d/c before 2 midnights.  Dispo: The patient is from: Home              Anticipated d/c is to: SNF              Patient currently is medically stable to d/c.   Difficult to place patient No         Consultants:  Orthopedics  Procedures:    Antimicrobials:      Subjective: Patient was seen sitting up in bed.  She reports that she took a full pill of Percocet last night and had increased confusion/paratransit overnight.  She says that half a tablet usually works better for her.  Objective: Vitals:   01/12/21 0300 01/12/21 0400 01/12/21 0500 01/12/21 0600  BP: (!) 114/53 (!) 116/46 (!) 132/54 (!) 151/74  Pulse: 62 64 63 75  Resp: 16 16 16 16   Temp:      TempSrc:      SpO2: 92% 94% 92% 93%  Weight:      Height:        Intake/Output Summary (Last 24 hours) at 01/12/2021 1113 Last data filed at 01/12/2021 1009 Gross per 24 hour  Intake --  Output 1100 ml  Net -1100 ml   Filed Weights   01/11/21 1241  Weight: 75.8 kg    Examination:  General exam: Appears calm and comfortable  Respiratory system: Clear to auscultation. Respiratory effort normal. Cardiovascular system: S1 & S2 heard, RRR. No JVD, murmurs, rubs, gallops or clicks. No pedal edema. Gastrointestinal system: Abdomen  is nondistended, soft and nontender. No organomegaly or masses felt. Normal bowel sounds heard. Central nervous system: Alert and oriented. No focal neurological deficits. Extremities: Symmetric 5 x 5 power. Skin: No rashes, lesions or ulcers Psychiatry: Judgement and insight appear normal. Mood & affect appropriate.     Data Reviewed: I have personally reviewed following labs and imaging studies  CBC: Recent Labs  Lab 01/11/21 1317  WBC 7.6  NEUTROABS  4.8  HGB 13.3  HCT 41.0  MCV 93.2  PLT 403*   Basic Metabolic Panel: Recent Labs  Lab 01/11/21 1317  NA 136  K 4.1  CL 97*  CO2 30  GLUCOSE 114*  BUN 19  CREATININE 0.85  CALCIUM 8.8*   GFR: Estimated Creatinine Clearance: 37.6 mL/min (by C-G formula based on SCr of 0.85 mg/dL). Liver Function Tests: No results for input(s): AST, ALT, ALKPHOS, BILITOT, PROT, ALBUMIN in the last 168 hours. No results for input(s): LIPASE, AMYLASE in the last 168 hours. No results for input(s): AMMONIA in the last 168 hours. Coagulation Profile: No results for input(s): INR, PROTIME in the last 168 hours. Cardiac Enzymes: No results for input(s): CKTOTAL, CKMB, CKMBINDEX, TROPONINI in the last 168 hours. BNP (last 3 results) No results for input(s): PROBNP in the last 8760 hours. HbA1C: No results for input(s): HGBA1C in the last 72 hours. CBG: No results for input(s): GLUCAP in the last 168 hours. Lipid Profile: No results for input(s): CHOL, HDL, LDLCALC, TRIG, CHOLHDL, LDLDIRECT in the last 72 hours. Thyroid Function Tests: No results for input(s): TSH, T4TOTAL, FREET4, T3FREE, THYROIDAB in the last 72 hours. Anemia Panel: No results for input(s): VITAMINB12, FOLATE, FERRITIN, TIBC, IRON, RETICCTPCT in the last 72 hours. Sepsis Labs: No results for input(s): PROCALCITON, LATICACIDVEN in the last 168 hours.  Recent Results (from the past 240 hour(s))  Resp Panel by RT-PCR (Flu A&B, Covid) Nasopharyngeal Swab     Status: None   Collection Time: 01/11/21  3:42 PM   Specimen: Nasopharyngeal Swab; Nasopharyngeal(NP) swabs in vial transport medium  Result Value Ref Range Status   SARS Coronavirus 2 by RT PCR NEGATIVE NEGATIVE Final    Comment: (NOTE) SARS-CoV-2 target nucleic acids are NOT DETECTED.  The SARS-CoV-2 RNA is generally detectable in upper respiratory specimens during the acute phase of infection. The lowest concentration of SARS-CoV-2 viral copies this assay can detect  is 138 copies/mL. A negative result does not preclude SARS-Cov-2 infection and should not be used as the sole basis for treatment or other patient management decisions. A negative result may occur with  improper specimen collection/handling, submission of specimen other than nasopharyngeal swab, presence of viral mutation(s) within the areas targeted by this assay, and inadequate number of viral copies(<138 copies/mL). A negative result must be combined with clinical observations, patient history, and epidemiological information. The expected result is Negative.  Fact Sheet for Patients:  EntrepreneurPulse.com.au  Fact Sheet for Healthcare Providers:  IncredibleEmployment.be  This test is no t yet approved or cleared by the [place] FDA and  has been authorized for detection and/or diagnosis of SARS-CoV-2 by FDA under an Emergency Use Authorization (EUA). This EUA will remain  in effect (meaning this test can be used) for the duration of the COVID-19 declaration under Section 564(b)(1) of the Act, 21 U.S.C.section 360bbb-3(b)(1), unless the authorization is terminated  or revoked sooner.       Influenza A by PCR NEGATIVE NEGATIVE Final   Influenza B by PCR NEGATIVE NEGATIVE Final  Comment: (NOTE) The Xpert Xpress SARS-CoV-2/FLU/RSV plus assay is intended as an aid in the diagnosis of influenza from Nasopharyngeal swab specimens and should not be used as a sole basis for treatment. Nasal washings and aspirates are unacceptable for Xpert Xpress SARS-CoV-2/FLU/RSV testing.  Fact Sheet for Patients: EntrepreneurPulse.com.au  Fact Sheet for Healthcare Providers: IncredibleEmployment.be  This test is not yet approved or cleared by the [place] FDA and has been authorized for detection and/or diagnosis of SARS-CoV-2 by FDA under an Emergency Use Authorization (EUA). This EUA will remain in effect  (meaning this test can be used) for the duration of the COVID-19 declaration under Section 564(b)(1) of the Act, 21 U.S.C. section 360bbb-3(b)(1), unless the authorization is terminated or revoked.  Performed at [facility], [address] [place], [place] [address]          Radiology Studies: DG Ribs Unilateral W/Chest Right  Result Date: 01/11/2021 CLINICAL DATA:  [place]. Right rib pain. EXAM: RIGHT RIBS AND CHEST - 3+ VIEW COMPARISON:  Chest CT 06/30/2020 FINDINGS: The cardiac silhouette, mediastinal hilar contours are within normal limits given the patient's age and supine position. Stable tortuosity and calcification of the thoracic aorta. Stable surgical changes from TAVR. No acute pulmonary findings. No pleural effusions or pneumothorax. Dedicated views of the right ribs demonstrate suspected acute and chronic rib fractures. No pleural thickening, pleural effusion or pneumothorax. IMPRESSION: 1. Suspect acute and chronic right rib fractures and chronic left rib fractures. 2. No acute cardiopulmonary findings. Electronically Signed   By: Marijo Sanes M.D.   On: 01/11/2021 14:33   DG Thoracic Spine 2 View  Result Date: 01/11/2021 CLINICAL DATA:  Fall. EXAM: THORACIC SPINE 2 VIEWS COMPARISON:  November 04, 2019.  January 06, 2021. FINDINGS: Mild dextroscoliosis of thoracic spine is noted. No spondylolisthesis is noted. No acute fracture is noted. Anterior osteophyte formation is noted at multiple levels of the lower thoracic spine. IMPRESSION: Degenerative changes as described above. No acute abnormality seen in the thoracic spine. Electronically Signed   By: Marijo Conception M.D.   On: 01/11/2021 14:29   DG Lumbar Spine Complete  Result Date: 01/11/2021 CLINICAL DATA:  Fall. EXAM: LUMBAR SPINE - COMPLETE 4+ VIEW COMPARISON:  January 06, 2021. FINDINGS: Status post kyphoplasty of L1, L2 and L4 levels. Old severe compression fractures are noted at L1 and L2. No acute fracture or  spondylolisthesis is noted. Degenerative changes are noted at multiple levels. IMPRESSION: No acute abnormality is noted. Electronically Signed   By: Marijo Conception M.D.   On: 01/11/2021 14:30   DG Ankle 2 Views Left  Result Date: 01/11/2021 CLINICAL DATA:  Left ankle pain after fall. EXAM: LEFT ANKLE - 2 VIEW COMPARISON:  None. FINDINGS: There is no evidence of fracture, dislocation, or joint effusion. There is no evidence of arthropathy or other focal bone abnormality. Soft tissues are unremarkable. IMPRESSION: Negative. Electronically Signed   By: Marijo Conception M.D.   On: 01/11/2021 14:25   CT HEAD WO CONTRAST (5MM)  Result Date: 01/11/2021 CLINICAL DATA:  87 year old female with history of trauma to the head and neck after falling in the bathtub. EXAM: CT HEAD WITHOUT CONTRAST CT CERVICAL SPINE WITHOUT CONTRAST TECHNIQUE: Multidetector CT imaging of the head and cervical spine was performed following the standard protocol without intravenous contrast. Multiplanar CT image reconstructions of the cervical spine were also generated. COMPARISON:  Head and cervical spine CT 12/30/2020. FINDINGS: CT HEAD FINDINGS Brain: Mild cerebral atrophy. Patchy  and confluent areas of decreased attenuation are noted throughout the deep and periventricular white matter of the cerebral hemispheres bilaterally, compatible with chronic microvascular ischemic disease. Physiologic calcifications in the basal ganglia bilaterally. No evidence of acute infarction, hemorrhage, hydrocephalus, extra-axial collection or mass lesion/mass effect. Vascular: No hyperdense vessel or unexpected calcification. Skull: Normal. Negative for fracture or focal lesion. Sinuses/Orbits: No acute finding. Other: None. CT CERVICAL SPINE FINDINGS Alignment: Reversal of normal cervical lordosis centered at the level of C3, likely chronic. Alignment is otherwise anatomic. Skull base and vertebrae: No acute fracture. No primary bone lesion or focal  pathologic process. Soft tissues and spinal canal: No prevertebral fluid or swelling. No visible canal hematoma. Disc levels: Multilevel degenerative disc disease, most evident at C5-C6 and C6-C7. Moderate multilevel facet arthropathy bilaterally. Upper chest: Scarring in the lung apices. Other: None. IMPRESSION: 1. No evidence of significant acute traumatic injury to the skull, brain or cervical spine. 2. Mild cerebral atrophy with extensive chronic microvascular ischemic changes in the cerebral white matter, as above. 3. Multilevel degenerative disc disease and cervical spondylosis, as above. Electronically Signed   By: Vinnie Langton M.D.   On: 01/11/2021 14:50   CT Cervical Spine Wo Contrast  Result Date: 01/11/2021 CLINICAL DATA:  87 year old female with history of trauma to the head and neck after falling in the bathtub. EXAM: CT HEAD WITHOUT CONTRAST CT CERVICAL SPINE WITHOUT CONTRAST TECHNIQUE: Multidetector CT imaging of the head and cervical spine was performed following the standard protocol without intravenous contrast. Multiplanar CT image reconstructions of the cervical spine were also generated. COMPARISON:  Head and cervical spine CT 12/30/2020. FINDINGS: CT HEAD FINDINGS Brain: Mild cerebral atrophy. Patchy and confluent areas of decreased attenuation are noted throughout the deep and periventricular white matter of the cerebral hemispheres bilaterally, compatible with chronic microvascular ischemic disease. Physiologic calcifications in the basal ganglia bilaterally. No evidence of acute infarction, hemorrhage, hydrocephalus, extra-axial collection or mass lesion/mass effect. Vascular: No hyperdense vessel or unexpected calcification. Skull: Normal. Negative for fracture or focal lesion. Sinuses/Orbits: No acute finding. Other: None. CT CERVICAL SPINE FINDINGS Alignment: Reversal of normal cervical lordosis centered at the level of C3, likely chronic. Alignment is otherwise anatomic. Skull  base and vertebrae: No acute fracture. No primary bone lesion or focal pathologic process. Soft tissues and spinal canal: No prevertebral fluid or swelling. No visible canal hematoma. Disc levels: Multilevel degenerative disc disease, most evident at C5-C6 and C6-C7. Moderate multilevel facet arthropathy bilaterally. Upper chest: Scarring in the lung apices. Other: None. IMPRESSION: 1. No evidence of significant acute traumatic injury to the skull, brain or cervical spine. 2. Mild cerebral atrophy with extensive chronic microvascular ischemic changes in the cerebral white matter, as above. 3. Multilevel degenerative disc disease and cervical spondylosis, as above. Electronically Signed   By: Vinnie Langton M.D.   On: 01/11/2021 14:50        Scheduled Meds:  amLODipine  2.5 mg Oral BID   apixaban  2.5 mg Oral BID   buPROPion  150 mg Oral BH-q7a   docusate sodium  100 mg Oral Daily   gabapentin  100 mg Oral QHS   latanoprost  1 drop Both Eyes QHS   lidocaine  1 patch Transdermal Q24H   pantoprazole  40 mg Oral Daily   sertraline  50 mg Oral BH-q7a   simvastatin  20 mg Oral q1800   timolol  1 drop Both Eyes BID   Continuous Infusions:   LOS: 0 days  Time spent: 27mins    Kathie Dike, MD [facility]   If 7PM-7AM, please contact night-coverage [email or web address]  01/12/2021, 11:13 AM

## 2021-01-12 NOTE — ED Notes (Signed)
Informed RN bed assigned 

## 2021-01-12 NOTE — Evaluation (Signed)
Physical Therapy Evaluation Patient Details Name: Caroline Aguilar MRN: [id number] DOB: [birth year] Today's Date: 01/12/2021  History of Present Illness  Pt is a 87 y.o. female with medical history significant of hypertension, hyperlipidemia, stroke, GERD, depression, colon cancer, atrial fibrillation on Eliquis, neuropathy, dCHF, frequent fall, compression fracture of the spine, who presents with multiple falls.  MD assessment includes: acute and chronic right rib fractures and chronic left rib fractures, L ankle sprain, and falls.  Pt/family also endorse recent diagnosis of lumbar compression fracture.   Clinical Impression  Pt was pleasant and motivated to participate during the session but was limited by pain with activity.  Pt required physical assistance with all functional tasks and presented with instability in both sitting and standing.  Pt's SpO2 and HR were both WNL during the session.  Pt is at a very high risk for falls and would not be safe to return to her prior living situation at this time. Pt will benefit from PT services in a SNF setting upon discharge to safely address deficits listed in patient problem list for decreased caregiver assistance and eventual return to PLOF.        Recommendations for follow up therapy are one component of a multi-disciplinary discharge planning process, led by the attending physician.  Recommendations may be updated based on patient status, additional functional criteria and insurance authorization.  Follow Up Recommendations SNF;Supervision/Assistance - 24 hour    Equipment Recommendations  None recommended by PT    Recommendations for Other Services       Precautions / Restrictions Precautions Precautions: Fall Required Braces or Orthoses: Other Brace Other Brace: Cam boot to L ankle Restrictions Weight Bearing Restrictions: No      Mobility  Bed Mobility Overal bed mobility: Needs Assistance Bed Mobility: Rolling;Sidelying to  Sit;Sit to Sidelying Rolling: Mod assist Sidelying to sit: Max assist     Sit to sidelying: Max assist General bed mobility comments: Assist required for BLE and trunk control with log roll training provided    Transfers Overall transfer level: Needs assistance Equipment used: Rolling walker (2 wheeled) Transfers: Sit to/from Stand Sit to Stand: Min assist;From elevated surface         General transfer comment: Mod verbal and tactile cues for sequencing  Ambulation/Gait Ambulation/Gait assistance: Min assist Gait Distance (Feet): 1 Feet Assistive device: Rolling walker (2 wheeled) Gait Pattern/deviations: Trunk flexed;Shuffle;Step-to pattern Gait velocity: decreased   General Gait Details: Pt able to take several very small, shuffling steps at the [facility]  Stairs            Wheelchair Mobility    Modified Rankin (Stroke Patients Only)       Balance Overall balance assessment: Needs assistance;History of Falls   Sitting balance-Leahy Scale: Fair     Standing balance support: Bilateral upper extremity supported;During functional activity Standing balance-Leahy Scale: Poor Standing balance comment: Extensive history of falls with min A for stability in standing and heavy use of the RW for support                             Pertinent Vitals/Pain Pain Assessment: 0-10 Pain Score: 10-Worst pain ever Pain Location: General back, RLE, R side, and L ankle pain all 0/10 at rest and 10/10 with movement Pain Descriptors / Indicators: Grimacing;Moaning;Aching;Sore Pain Intervention(s): Repositioned;Premedicated before session;Monitored during session    Caroline Aguilar expects to be discharged to:: Private residence Living Arrangements: Alone Available Help  at Discharge: Family;Available 24 hours/day Type of Home: House Home Access: Stairs to enter Entrance Stairs-Rails: Right Entrance Stairs-Number of Steps: 4 Home Layout: One level Home  Equipment: Walker - 2 wheels;Bedside commode;Tub bench;Cane - single point;Other (comment) Additional Comments: Lift chair    Prior Function Level of Independence: Needs assistance   Gait / Transfers Assistance Needed: Mod Ind amb with a RW in the home, SBA/CGA with stairs, 3 falls in the last 6 months secondary to LOB           Hand Dominance   Dominant Hand: Right    Extremity/Trunk Assessment   Upper Extremity Assessment Upper Extremity Assessment: Generalized weakness    Lower Extremity Assessment Lower Extremity Assessment: Generalized weakness       Communication   Communication: No difficulties  Cognition Arousal/Alertness: Awake/alert Behavior During Therapy: WFL for tasks assessed/performed Overall Cognitive Status: Within Functional Limits for tasks assessed                                        General Comments      Exercises Other Exercises Other Exercises: Log roll training Other Exercises: Static sitting and standing at EOB for improved balance and activity tolerance   Assessment/Plan    PT Assessment Patient needs continued PT services  PT Problem List Decreased strength;Decreased activity tolerance;Decreased balance;Decreased mobility;Decreased knowledge of use of DME;Pain       PT Treatment Interventions DME instruction;Gait training;Stair training;Functional mobility training;Therapeutic activities;Therapeutic exercise;Balance training;Patient/family education    PT Goals (Current goals can be found in the Care Plan section)  Acute Rehab PT Goals Patient Stated Goal: Better balance to prevent falls PT Goal Formulation: With patient Time For Goal Achievement: 01/25/21 Potential to Achieve Goals: Fair    Frequency Min 2X/week   Barriers to discharge Inaccessible home environment;Decreased caregiver support      Co-evaluation               AM-PAC PT "6 Clicks" Mobility  Outcome Measure Help needed turning from  your back to your side while in a flat bed without using bedrails?: A Lot Help needed moving from lying on your back to sitting on the side of a flat bed without using bedrails?: Total Help needed moving to and from a bed to a chair (including a wheelchair)?: Total Help needed standing up from a chair using your arms (e.g., wheelchair or bedside chair)?: A Little Help needed to walk in hospital room?: Total Help needed climbing 3-5 steps with a railing? : Total 6 Click Score: 9    End of Session Equipment Utilized During Treatment: Gait belt Activity Tolerance: Patient limited by pain Patient left: in bed;with nursing/sitter in room;with call bell/phone within reach;with family/visitor present;Other (comment) (with bilateral rails up in ED as pt found) Nurse Communication: Mobility status PT Visit Diagnosis: Unsteadiness on feet (R26.81);History of falling (Z91.81);Muscle weakness (generalized) (M62.81);Difficulty in walking, not elsewhere classified (R26.2);Pain Pain - Right/Left: Left (Low back, R ribs, RLE, L ankle) Pain - part of body: Ankle and joints of foot;Hip    Time: 6979-4801 PT Time Calculation (min) (ACUTE ONLY): 35 min   Charges:   PT Evaluation $PT Eval Moderate Complexity: 1 Mod PT Treatments $Therapeutic Activity: 8-22 mins        D. Scott Lennis Rader PT, DPT 01/12/21, 10:48 AM

## 2021-01-13 DIAGNOSIS — I480 Paroxysmal atrial fibrillation: Secondary | ICD-10-CM | POA: Diagnosis not present

## 2021-01-13 DIAGNOSIS — S2243XA Multiple fractures of ribs, bilateral, initial encounter for closed fracture: Secondary | ICD-10-CM | POA: Diagnosis not present

## 2021-01-13 DIAGNOSIS — I5032 Chronic diastolic (congestive) heart failure: Secondary | ICD-10-CM | POA: Diagnosis not present

## 2021-01-13 DIAGNOSIS — W19XXXA Unspecified fall, initial encounter: Secondary | ICD-10-CM | POA: Diagnosis not present

## 2021-01-13 MED ORDER — PREDNISONE 10 MG PO TABS
10.0000 mg | ORAL_TABLET | Freq: Every day | ORAL | Status: DC
  Administered 2021-01-13 – 2021-01-16 (×4): 10 mg via ORAL
  Filled 2021-01-13 (×4): qty 1

## 2021-01-13 MED ORDER — OXYCODONE HCL 5 MG PO TABS
2.5000 mg | ORAL_TABLET | ORAL | Status: DC | PRN
Start: 2021-01-13 — End: 2021-01-18
  Administered 2021-01-14 – 2021-01-18 (×8): 2.5 mg via ORAL
  Filled 2021-01-13 (×8): qty 1

## 2021-01-13 NOTE — Progress Notes (Signed)
PROGRESS NOTE    Caroline CUMPIAN  JME:[id number] DOB: [birth year] DOA: 01/11/2021 PCP: [name], MD    Brief Narrative:  87 year old female with a history of hypertension, hyperlipidemia, prior stroke, atrial fibrillation on anticoagulation, diastolic heart failure, admitted to the hospital with multiple falls.  She was noted to have acute right-sided rib fractures.  She also complained of back pain and recent imaging showed acute on chronic lumbar compression fracture.  She was admitted for pain management.  Orthopedics consulted to see if she would be a candidate for kyphoplasty.  Seen by PT with recommendation for skilled nursing facility placement.   Assessment & Plan:   Principal Problem:   Rib fracture Active Problems:   AF (paroxysmal atrial fibrillation) (HCC)   Stroke (HCC)   HLD (hyperlipidemia)   (HFpEF) heart failure with preserved ejection fraction ([place])   Fall   Depression   Compression fracture of spine (HCC)   HTN (hypertension)   Right-sided rib fractures status post fall -Continue supportive management with pain management -Incentive spirometry -Physical therapy  Back pain with lumbar compression fractures status post fall -History of the same where she has undergone kyphoplasty and multiple lumbar vertebra -Recent MRI from 9/22 shows acute lumbar compression fracture of L1 vertebra, although it is also commented that she has had kyphoplasty at L1 in the past -Reviewed by Dr. Rudene Christians, and it was felt that patient's pain likely emanated from lower than L1.  Further kyphoplasty or operative management at this time was not recommended.  MRI also did not reveal any evidence of sacral insufficiency fracture -It was felt that she has severe L4-L5 lumbar stenosis which could be contributing to her discomfort.  Recommendations were to follow-up in pain clinic for epidural steroid injection. -Recommendations discussed with patient and she said that she has received a  steroid injection in the past and "began hallucinating".  She has done well with low-dose prednisone in the past and is willing to try this at this time.  Paroxysmal atrial fibrillation -Heart rate is currently stable -She is anticoagulated with apixaban  Hypertension -Continue on amlodipine  Chronic diastolic congestive heart failure -Appears to be compensated -Continue current management  Recurrent falls -Seen by physical therapy with recommendation for skilled nursing facility placement -TOC assistance requested   DVT prophylaxis: apixaban (ELIQUIS) tablet 2.5 mg Start: 01/12/21 0200 apixaban (ELIQUIS) tablet 2.5 mg  Code Status: Full code Family Communication: Discussed with daughter at the bedside Disposition Plan: Status is: Observation  The patient remains OBS appropriate and will d/c before 2 midnights.  Dispo: The patient is from: Home              Anticipated d/c is to: SNF              Patient currently is medically stable to d/c.   Difficult to place patient No         Consultants:  Orthopedics  Procedures:    Antimicrobials:      Subjective: Reports continued pain in her lower back, she also has pain in her right ribs  Objective: Vitals:   01/13/21 0500 01/13/21 0513 01/13/21 0819 01/13/21 1629  BP:  130/70 138/67 (!) 148/58  Pulse:  70 64 65  Resp:  16 14 16   Temp:  97.9 F (36.6 C) 97.8 F (36.6 C) (!) 97.4 F (36.3 C)  TempSrc:      SpO2:  92% 90% 92%  Weight: 75.8 kg     Height:  Intake/Output Summary (Last 24 hours) at 01/13/2021 2005 Last data filed at 01/13/2021 1733 Gross per 24 hour  Intake 120 ml  Output 900 ml  Net -780 ml   Filed Weights   01/11/21 1241 01/12/21 2011 01/13/21 0500  Weight: 75.8 kg 75.9 kg 75.8 kg    Examination:  General exam: Appears calm and comfortable  Respiratory system: Clear to auscultation. Respiratory effort normal. Cardiovascular system: S1 & S2 heard, RRR. No JVD, murmurs, rubs,  gallops or clicks. No pedal edema. Gastrointestinal system: Abdomen is nondistended, soft and nontender. No organomegaly or masses felt. Normal bowel sounds heard. Central nervous system: Alert and oriented. No focal neurological deficits. Extremities: Symmetric 5 x 5 power. Skin: No rashes, lesions or ulcers Psychiatry: Judgement and insight appear normal. Mood & affect appropriate.     Data Reviewed: I have personally reviewed following labs and imaging studies  CBC: Recent Labs  Lab 01/11/21 1317  WBC 7.6  NEUTROABS 4.8  HGB 13.3  HCT 41.0  MCV 93.2  PLT 132*   Basic Metabolic Panel: Recent Labs  Lab 01/11/21 1317  NA 136  K 4.1  CL 97*  CO2 30  GLUCOSE 114*  BUN 19  CREATININE 0.85  CALCIUM 8.8*   GFR: Estimated Creatinine Clearance: 37.6 mL/min (by C-G formula based on SCr of 0.85 mg/dL). Liver Function Tests: No results for input(s): AST, ALT, ALKPHOS, BILITOT, PROT, ALBUMIN in the last 168 hours. No results for input(s): LIPASE, AMYLASE in the last 168 hours. No results for input(s): AMMONIA in the last 168 hours. Coagulation Profile: No results for input(s): INR, PROTIME in the last 168 hours. Cardiac Enzymes: No results for input(s): CKTOTAL, CKMB, CKMBINDEX, TROPONINI in the last 168 hours. BNP (last 3 results) No results for input(s): PROBNP in the last 8760 hours. HbA1C: No results for input(s): HGBA1C in the last 72 hours. CBG: No results for input(s): GLUCAP in the last 168 hours. Lipid Profile: No results for input(s): CHOL, HDL, LDLCALC, TRIG, CHOLHDL, LDLDIRECT in the last 72 hours. Thyroid Function Tests: No results for input(s): TSH, T4TOTAL, FREET4, T3FREE, THYROIDAB in the last 72 hours. Anemia Panel: No results for input(s): VITAMINB12, FOLATE, FERRITIN, TIBC, IRON, RETICCTPCT in the last 72 hours. Sepsis Labs: No results for input(s): PROCALCITON, LATICACIDVEN in the last 168 hours.  Recent Results (from the past 240 hour(s))  Resp  Panel by RT-PCR (Flu A&B, Covid) Nasopharyngeal Swab     Status: None   Collection Time: 01/11/21  3:42 PM   Specimen: Nasopharyngeal Swab; Nasopharyngeal(NP) swabs in vial transport medium  Result Value Ref Range Status   SARS Coronavirus 2 by RT PCR NEGATIVE NEGATIVE Final    Comment: (NOTE) SARS-CoV-2 target nucleic acids are NOT DETECTED.  The SARS-CoV-2 RNA is generally detectable in upper respiratory specimens during the acute phase of infection. The lowest concentration of SARS-CoV-2 viral copies this assay can detect is 138 copies/mL. A negative result does not preclude SARS-Cov-2 infection and should not be used as the sole basis for treatment or other patient management decisions. A negative result may occur with  improper specimen collection/handling, submission of specimen other than nasopharyngeal swab, presence of viral mutation(s) within the areas targeted by this assay, and inadequate number of viral copies(<138 copies/mL). A negative result must be combined with clinical observations, patient history, and epidemiological information. The expected result is Negative.  Fact Sheet for Patients:  EntrepreneurPulse.com.au  Fact Sheet for Healthcare Providers:  IncredibleEmployment.be  This test is no t  yet approved or cleared by the [place] and  has been authorized for detection and/or diagnosis of SARS-CoV-2 by FDA under an Emergency Use Authorization (EUA). This EUA will remain  in effect (meaning this test can be used) for the duration of the COVID-19 declaration under Section 564(b)(1) of the Act, 21 U.S.C.section 360bbb-3(b)(1), unless the authorization is terminated  or revoked sooner.       Influenza A by PCR NEGATIVE NEGATIVE Final   Influenza B by PCR NEGATIVE NEGATIVE Final    Comment: (NOTE) The Xpert Xpress SARS-CoV-2/FLU/RSV plus assay is intended as an aid in the diagnosis of influenza from Nasopharyngeal  swab specimens and should not be used as a sole basis for treatment. Nasal washings and aspirates are unacceptable for Xpert Xpress SARS-CoV-2/FLU/RSV testing.  Fact Sheet for Patients: EntrepreneurPulse.com.au  Fact Sheet for Healthcare Providers: IncredibleEmployment.be  This test is not yet approved or cleared by the [place] FDA and has been authorized for detection and/or diagnosis of SARS-CoV-2 by FDA under an Emergency Use Authorization (EUA). This EUA will remain in effect (meaning this test can be used) for the duration of the COVID-19 declaration under Section 564(b)(1) of the Act, 21 U.S.C. section 360bbb-3(b)(1), unless the authorization is terminated or revoked.  Performed at [facility], [address], [place], [place] [address]          Radiology Studies: No results found.      Scheduled Meds:  amLODipine  2.5 mg Oral BID   apixaban  2.5 mg Oral BID   buPROPion  150 mg Oral BH-q7a   docusate sodium  100 mg Oral Daily   gabapentin  100 mg Oral QHS   latanoprost  1 drop Both Eyes QHS   lidocaine  1 patch Transdermal Q24H   pantoprazole  40 mg Oral Daily   predniSONE  10 mg Oral Q breakfast   sertraline  50 mg Oral BH-q7a   simvastatin  20 mg Oral q1800   timolol  1 drop Both Eyes BID   Continuous Infusions:   LOS: 0 days    Time spent: 68mins    Kathie Dike, MD [facility]   If 7PM-7AM, please contact night-coverage [email or web address]  01/13/2021, 8:05 PM

## 2021-01-13 NOTE — TOC Progression Note (Signed)
Transition of Care [facility]) - Progression Note    Patient Details  Name: Caroline Aguilar MRN: [id number] Date of Birth: Mar 07, 1926  Transition of Care [facility]) CM/SW [place], RN Phone Number: 01/13/2021, 2:18 PM  Clinical Narrative:    Reached out to the area STR SNF facilities and requested to review for a bed offer, awaiting offers to review with the patient        Expected Discharge Plan and Services                                                 Social Determinants of Health (SDOH) Interventions    Readmission Risk Interventions No flowsheet data found.

## 2021-01-13 NOTE — NC FL2 (Signed)
[place] LEVEL OF CARE SCREENING TOOL     IDENTIFICATION  Patient Name: Caroline Aguilar Birthdate: 1925-10-09 Sex: female Admission Date (Current Location): 01/11/2021  [facility] and [place] Number:  Engineering geologist and Address:  [facility], [address], [place], [place] [address]      Provider Number: [id number]  Attending Physician Name and Address:  Kathie Dike, MD  Relative Name and Phone Number:  Edd Arbour [phone]    Current Level of Care: Hospital Recommended Level of Care: [place] Prior Approval Number:    Date Approved/Denied:   PASRR Number: [id number]  Discharge Plan: SNF    Current Diagnoses: Patient Active Problem List   Diagnosis Date Noted   Rib fracture 01/11/2021   Depression 01/11/2021   Compression fracture of spine ([place]) 01/11/2021   HTN (hypertension) 01/11/2021   Trauma 01/31/2020   Fall 01/31/2020   Contusion of face 01/31/2020   Stroke ([place]) 06/11/2017   HLD (hyperlipidemia) 06/11/2017   GERD (gastroesophageal reflux disease) 06/11/2017   (HFpEF) heart failure with preserved ejection fraction ([place]) 06/11/2017   Thigh hematoma 09/02/2016   Dyspnea 07/15/2015   Chest pain at rest 07/15/2015   Community acquired pneumonia 07/02/2015   Hyponatremia 07/02/2015   AF (paroxysmal atrial fibrillation) ([place]) 07/02/2015    Orientation RESPIRATION BLADDER Height & Weight     Self, Time, Situation, Place  Normal Continent, External catheter Weight: 75.8 kg Height:  5' (152.4 cm)  BEHAVIORAL SYMPTOMS/MOOD NEUROLOGICAL BOWEL NUTRITION STATUS      Continent  (low sodium)  AMBULATORY STATUS COMMUNICATION OF NEEDS Skin   Extensive Assist Verbally Normal                       Personal Care Assistance Level of Assistance  Dressing, Bathing Bathing Assistance: Limited assistance   Dressing Assistance: Limited assistance     Functional Limitations Info              SPECIAL CARE FACTORS FREQUENCY  PT (By licensed PT), OT (By licensed OT)     PT Frequency: 5 times per week OT Frequency: 5 times per week            Contractures Contractures Info: Not present    Additional Factors Info  Code Status, Allergies Code Status Info: FUll code Allergies Info: Penicillins, Penicillins, Azithromycin, Cortisone, Famciclovir, Meloxicam, Morphine, Streptomycin, Tramadol, Venlafaxine           Current Medications (01/13/2021):  This is the current hospital active medication list Current Facility-Administered Medications  Medication Dose Route Frequency Provider Last Rate Last Admin   acetaminophen (TYLENOL) tablet 650 mg  650 mg Oral Q6H PRN Ivor Costa, MD       amLODipine (NORVASC) tablet 2.5 mg  2.5 mg Oral BID Ivor Costa, MD   2.5 mg at 01/12/21 2025   apixaban (ELIQUIS) tablet 2.5 mg  2.5 mg Oral BID Ivor Costa, MD   2.5 mg at 01/12/21 2025   buPROPion (WELLBUTRIN XL) 24 hr tablet 150 mg  150 mg Oral Jaye Beagle, Soledad Gerlach, MD   150 mg at 01/13/21 0350   docusate sodium (COLACE) capsule 100 mg  100 mg Oral Daily Ivor Costa, MD   100 mg at 01/12/21 1115   fentaNYL (SUBLIMAZE) injection 12.5 mcg  12.5 mcg Intravenous Q4H PRN Ivor Costa, MD       gabapentin (NEURONTIN) capsule 100 mg  100 mg Oral QHS Ivor Costa, MD  100 mg at 01/12/21 2026   hydrALAZINE (APRESOLINE) injection 5 mg  5 mg Intravenous Q2H PRN Ivor Costa, MD       latanoprost (XALATAN) 0.005 % ophthalmic solution 1 drop  1 drop Both Eyes QHS Ivor Costa, MD   1 drop at 01/12/21 2029   lidocaine (LIDODERM) 5 % 1 patch  1 patch Transdermal Q24H Ivor Costa, MD   1 patch at 01/12/21 1640   methocarbamol (ROBAXIN) tablet 500 mg  500 mg Oral Q8H PRN Ivor Costa, MD   500 mg at 01/13/21 0745   ondansetron (ZOFRAN) injection 4 mg  4 mg Intravenous Q8H PRN Ivor Costa, MD       oxyCODONE-acetaminophen (PERCOCET/ROXICET) 5-325 MG per tablet 0.5 tablet  0.5 tablet Oral Q6H PRN Kathie Dike, MD   0.5  tablet at 01/13/21 0744   pantoprazole (PROTONIX) EC tablet 40 mg  40 mg Oral Daily Ivor Costa, MD   40 mg at 01/12/21 1116   sertraline (ZOLOFT) tablet 50 mg  50 mg Oral Jaye Beagle, Soledad Gerlach, MD   50 mg at 01/13/21 4128   simvastatin (ZOCOR) tablet 20 mg  20 mg Oral q1800 Ivor Costa, MD   20 mg at 01/12/21 1640   timolol (TIMOPTIC) 0.5 % ophthalmic solution 1 drop  1 drop Both Eyes BID Ivor Costa, MD   1 drop at 01/12/21 2029     Discharge Medications: Please see discharge summary for a list of discharge medications.  Relevant Imaging Results:  Relevant Lab Results:   Additional Information SSN: [id number]  Su Hilt, RN

## 2021-01-13 NOTE — TOC Progression Note (Addendum)
Transition of Care (TOC) - Progression Note    Patient Details  Name: Caroline Aguilar MRN: [id number] Date of Birth: [birth year]  [facility]) CM/SW [place], RN Phone Number: 01/13/2021, 9:02 AM  Clinical Narrative:    Met with the patient and her daughter in law in the room at the bedside, She lives at home with her family, she has had several falls in the past and participated in Home health PT several times, they are agreeable to go tto STR and will return home with her family after STR, She has had the Covid vaccines but not the booster, she has had covid, She does not want to get the booster, She understands that in a STR she would be quarantined for 10 days.  She is agreeable to a bed search PASSR number [id number] A FL2 completed, Bedsearch sent, will review once obtained and get ins approval       Expected Discharge Plan and Services                                                 Social Determinants of Health (SDOH) Interventions    Readmission Risk Interventions No flowsheet data found.

## 2021-01-14 DIAGNOSIS — S2243XA Multiple fractures of ribs, bilateral, initial encounter for closed fracture: Secondary | ICD-10-CM | POA: Diagnosis not present

## 2021-01-14 DIAGNOSIS — I5032 Chronic diastolic (congestive) heart failure: Secondary | ICD-10-CM | POA: Diagnosis not present

## 2021-01-14 DIAGNOSIS — W19XXXA Unspecified fall, initial encounter: Secondary | ICD-10-CM | POA: Diagnosis not present

## 2021-01-14 DIAGNOSIS — I480 Paroxysmal atrial fibrillation: Secondary | ICD-10-CM | POA: Diagnosis not present

## 2021-01-14 MED ORDER — OXYCODONE HCL 5 MG PO TABS: 2.5000 mg | ORAL_TABLET | ORAL | 0 refills | Status: DC | PRN

## 2021-01-14 MED ORDER — PREDNISONE 10 MG PO TABS: 10.0000 mg | ORAL_TABLET | Freq: Every day | ORAL | 0 refills | Status: DC

## 2021-01-14 MED ORDER — LIDOCAINE 5 % EX PTCH
1.0000 | MEDICATED_PATCH | CUTANEOUS | 0 refills | Status: DC
Start: 2021-01-14 — End: 2022-01-17

## 2021-01-14 NOTE — Plan of Care (Signed)

## 2021-01-14 NOTE — Discharge Summary (Signed)
Physician Discharge Summary  Caroline Aguilar RDE:[id number] DOB: [birth year] DOA: 01/11/2021  PCP: [name], MD  Admit date: 01/11/2021 Discharge date: 01/17/2021  Admitted From: Home Disposition: Skilled nursing facility  Recommendations for Outpatient Follow-up:  Follow up with PCP in 1-2 weeks Please obtain BMP/CBC in one week  Discharge Condition: Stable CODE STATUS: Full code Diet recommendation: Heart healthy  Brief/Interim Summary: 87 year old female with a history of hypertension, hyperlipidemia, prior stroke, atrial fibrillation on anticoagulation, diastolic heart failure, admitted to the hospital with multiple falls.  She was noted to have acute right-sided rib fractures.  She also complained of back pain and recent imaging showed acute on chronic lumbar compression fracture.  She was admitted for pain management.  Orthopedics consulted to see if she would be a candidate for kyphoplasty.  Seen by PT with recommendation for skilled nursing facility placement.  Discharge Diagnoses:  Principal Problem:   Rib fracture Active Problems:   AF (paroxysmal atrial fibrillation) (HCC)   Stroke (HCC)   HLD (hyperlipidemia)   (HFpEF) heart failure with preserved ejection fraction ([place])   Fall   Depression   Compression fracture of spine (HCC)   HTN (hypertension)  Right-sided rib fractures status post fall -Continue supportive management with pain management -Incentive spirometry -Physical therapy   Back pain with lumbar compression fractures status post fall -History of the same where she has undergone kyphoplasty and multiple lumbar vertebra -Recent MRI from 9/22 shows acute lumbar compression fracture of L1 vertebra, although it is also commented that she has had kyphoplasty at L1 in the past -Reviewed by Dr. Rudene Christians, and it was felt that patient's pain likely emanated from lower than L1.  Further kyphoplasty or operative management at this time was not recommended.  MRI  also did not reveal any evidence of sacral insufficiency fracture -It was felt that she has severe L4-L5 lumbar stenosis which could be contributing to her discomfort.  Recommendations were to follow-up in pain clinic for epidural steroid injection. -Recommendations discussed with patient and she said that she has received a steroid injection in the past and "began hallucinating".  She has done well with low-dose prednisone in the past and is willing to try this at this time. -She is been started on a course of prednisone 10 mg daily for 7 days   Paroxysmal atrial fibrillation -Heart rate is currently stable -She is anticoagulated with apixaban   Hypertension -Continue on amlodipine   Chronic diastolic congestive heart failure -Appears to be compensated -Continue current management   Recurrent falls -Seen by physical therapy with recommendation for skilled nursing facility placement  Discharge Instructions   Allergies as of 01/14/2021       Reactions   Penicillins Anaphylaxis, Other (See Comments)   Has patient had a PCN reaction causing immediate rash, facial/tongue/throat swelling, SOB or lightheadedness with hypotension: Yes Has patient had a PCN reaction causing severe rash involving mucus membranes or skin necrosis: No Has patient had a PCN reaction that required hospitalization No Has patient had a PCN reaction occurring within the last 10 years: No If all of the above answers are "NO", then may proceed with Cephalosporin use.   Penicillins Anaphylaxis   Azithromycin Other (See Comments)   Reaction:  Unknown    Cortisone    Other reaction(s): Hallucination   Famciclovir Other (See Comments)   Reaction:  Unknown    Meloxicam    Morphine    Streptomycin    Tramadol    Other reaction(s): Hallucination  Venlafaxine    Other reaction(s): Dizziness        Medication List     STOP taking these medications    amiodarone 200 MG tablet Commonly known as: PACERONE    oxyCODONE-acetaminophen 5-325 MG tablet Commonly known as: PERCOCET/ROXICET       TAKE these medications    acetaminophen 325 MG tablet Commonly known as: TYLENOL Take 650 mg by mouth every 6 (six) hours as needed for moderate pain.   amLODipine 5 MG tablet Commonly known as: NORVASC Take 2.5 mg by mouth 2 (two) times daily.   apixaban 2.5 MG Tabs tablet Commonly known as: ELIQUIS Take 2.5 mg by mouth 2 (two) times daily.   buPROPion 150 MG 24 hr tablet Commonly known as: WELLBUTRIN XL Take 150 mg by mouth every morning.   Calcium Carbonate-Vitamin D 600-400 MG-UNIT tablet Take 1 tablet by mouth daily.   docusate sodium 100 MG capsule Commonly known as: COLACE Take 100 mg by mouth daily.   gabapentin 100 MG capsule Commonly known as: NEURONTIN Take 100 mg by mouth at bedtime.   latanoprost 0.005 % ophthalmic solution Commonly known as: XALATAN Place 1 drop into both eyes at bedtime.   lidocaine 5 % Commonly known as: LIDODERM Place 1 patch onto the skin daily. Remove & Discard patch within 12 hours or as directed by MD   omeprazole 20 MG capsule Commonly known as: PRILOSEC Take 20 mg by mouth at bedtime.   oxyCODONE 5 MG immediate release tablet Commonly known as: Oxy IR/ROXICODONE Take 0.5 tablets (2.5 mg total) by mouth every 3 (three) hours as needed for moderate pain.   predniSONE 10 MG tablet Commonly known as: DELTASONE Take 1 tablet (10 mg total) by mouth daily with breakfast for 7 days. Start taking on: January 15, 2021   sertraline 50 MG tablet Commonly known as: ZOLOFT Take 50 mg by mouth every morning.   simvastatin 20 MG tablet Commonly known as: ZOCOR Take 20 mg by mouth daily at 6 PM.   timolol 0.5 % ophthalmic solution Commonly known as: TIMOPTIC Place 1 drop into both eyes 2 (two) times daily.        Allergies  Allergen Reactions   Penicillins Anaphylaxis and Other (See Comments)    Has patient had a PCN reaction causing  immediate rash, facial/tongue/throat swelling, SOB or lightheadedness with hypotension: Yes Has patient had a PCN reaction causing severe rash involving mucus membranes or skin necrosis: No Has patient had a PCN reaction that required hospitalization No Has patient had a PCN reaction occurring within the last 10 years: No If all of the above answers are "NO", then may proceed with Cephalosporin use.   Penicillins Anaphylaxis   Azithromycin Other (See Comments)    Reaction:  Unknown    Cortisone     Other reaction(s): Hallucination   Famciclovir Other (See Comments)    Reaction:  Unknown    Meloxicam    Morphine    Streptomycin    Tramadol     Other reaction(s): Hallucination   Venlafaxine     Other reaction(s): Dizziness    Consultations:    Procedures/Studies: DG Ribs Unilateral W/Chest Right  Result Date: 01/11/2021 CLINICAL DATA:  [place]. Right rib pain. EXAM: RIGHT RIBS AND CHEST - 3+ VIEW COMPARISON:  Chest CT 06/30/2020 FINDINGS: The cardiac silhouette, mediastinal hilar contours are within normal limits given the patient's age and supine position. Stable tortuosity and calcification of the thoracic aorta. Stable surgical changes from  TAVR. No acute pulmonary findings. No pleural effusions or pneumothorax. Dedicated views of the right ribs demonstrate suspected acute and chronic rib fractures. No pleural thickening, pleural effusion or pneumothorax. IMPRESSION: 1. Suspect acute and chronic right rib fractures and chronic left rib fractures. 2. No acute cardiopulmonary findings. Electronically Signed   By: Marijo Sanes M.D.   On: 01/11/2021 14:33   DG Thoracic Spine 2 View  Result Date: 01/11/2021 CLINICAL DATA:  Fall. EXAM: THORACIC SPINE 2 VIEWS COMPARISON:  November 04, 2019.  January 06, 2021. FINDINGS: Mild dextroscoliosis of thoracic spine is noted. No spondylolisthesis is noted. No acute fracture is noted. Anterior osteophyte formation is noted at multiple levels of the lower  thoracic spine. IMPRESSION: Degenerative changes as described above. No acute abnormality seen in the thoracic spine. Electronically Signed   By: Marijo Conception M.D.   On: 01/11/2021 14:29   DG Lumbar Spine Complete  Result Date: 01/11/2021 CLINICAL DATA:  Fall. EXAM: LUMBAR SPINE - COMPLETE 4+ VIEW COMPARISON:  January 06, 2021. FINDINGS: Status post kyphoplasty of L1, L2 and L4 levels. Old severe compression fractures are noted at L1 and L2. No acute fracture or spondylolisthesis is noted. Degenerative changes are noted at multiple levels. IMPRESSION: No acute abnormality is noted. Electronically Signed   By: Marijo Conception M.D.   On: 01/11/2021 14:30   DG Ankle 2 Views Left  Result Date: 01/11/2021 CLINICAL DATA:  Left ankle pain after fall. EXAM: LEFT ANKLE - 2 VIEW COMPARISON:  None. FINDINGS: There is no evidence of fracture, dislocation, or joint effusion. There is no evidence of arthropathy or other focal bone abnormality. Soft tissues are unremarkable. IMPRESSION: Negative. Electronically Signed   By: Marijo Conception M.D.   On: 01/11/2021 14:25   CT HEAD WO CONTRAST (5MM)  Result Date: 01/11/2021 CLINICAL DATA:  87 year old female with history of trauma to the head and neck after falling in the bathtub. EXAM: CT HEAD WITHOUT CONTRAST CT CERVICAL SPINE WITHOUT CONTRAST TECHNIQUE: Multidetector CT imaging of the head and cervical spine was performed following the standard protocol without intravenous contrast. Multiplanar CT image reconstructions of the cervical spine were also generated. COMPARISON:  Head and cervical spine CT 12/30/2020. FINDINGS: CT HEAD FINDINGS Brain: Mild cerebral atrophy. Patchy and confluent areas of decreased attenuation are noted throughout the deep and periventricular white matter of the cerebral hemispheres bilaterally, compatible with chronic microvascular ischemic disease. Physiologic calcifications in the basal ganglia bilaterally. No evidence of acute  infarction, hemorrhage, hydrocephalus, extra-axial collection or mass lesion/mass effect. Vascular: No hyperdense vessel or unexpected calcification. Skull: Normal. Negative for fracture or focal lesion. Sinuses/Orbits: No acute finding. Other: None. CT CERVICAL SPINE FINDINGS Alignment: Reversal of normal cervical lordosis centered at the level of C3, likely chronic. Alignment is otherwise anatomic. Skull base and vertebrae: No acute fracture. No primary bone lesion or focal pathologic process. Soft tissues and spinal canal: No prevertebral fluid or swelling. No visible canal hematoma. Disc levels: Multilevel degenerative disc disease, most evident at C5-C6 and C6-C7. Moderate multilevel facet arthropathy bilaterally. Upper chest: Scarring in the lung apices. Other: None. IMPRESSION: 1. No evidence of significant acute traumatic injury to the skull, brain or cervical spine. 2. Mild cerebral atrophy with extensive chronic microvascular ischemic changes in the cerebral white matter, as above. 3. Multilevel degenerative disc disease and cervical spondylosis, as above. Electronically Signed   By: Vinnie Langton M.D.   On: 01/11/2021 14:50   CT HEAD WO CONTRAST (5MM)  Result Date: 12/30/2020 CLINICAL DATA:  Fall.  Neck pain and headache. EXAM: CT HEAD WITHOUT CONTRAST CT CERVICAL SPINE WITHOUT CONTRAST TECHNIQUE: Multidetector CT imaging of the head and cervical spine was performed following the standard protocol without intravenous contrast. Multiplanar CT image reconstructions of the cervical spine were also generated. COMPARISON:  Head CT 08/10/2020 FINDINGS: CT HEAD FINDINGS Brain: No acute intracranial hemorrhage. No focal mass lesion. No CT evidence of acute infarction. No midline shift or mass effect. No hydrocephalus. Basilar cisterns are patent. There are periventricular and subcortical white matter hypodensities. Generalized cortical atrophy. Vascular: No hyperdense vessel or unexpected calcification.  Skull: Normal. Negative for fracture or focal lesion. Sinuses/Orbits: Paranasal sinuses and mastoid air cells are clear. Orbits are clear. Other: None. CT CERVICAL SPINE FINDINGS Alignment: Normal alignment of the cervical vertebral bodies. Skull base and vertebrae: Normal craniocervical junction. No loss of vertebral body height or disc height. Normal facet articulation. No evidence of fracture. Soft tissues and spinal canal: No prevertebral soft tissue swelling. No perispinal or epidural hematoma. Disc levels: There is disc space narrowing and endplate sclerosis from C5-C7. Acute findings. Mild endplate spurring is levels Upper chest: Clear Other: None IMPRESSION: 1. No acute intracranial trauma. 2. Chronic atrophy and white matter microvascular disease. 3. No cervical spine fracture. 4. Multilevel disc osteophytic disease. Electronically Signed   By: Suzy Bouchard M.D.   On: 12/30/2020 12:58   CT Cervical Spine Wo Contrast  Result Date: 01/11/2021 CLINICAL DATA:  87 year old female with history of trauma to the head and neck after falling in the bathtub. EXAM: CT HEAD WITHOUT CONTRAST CT CERVICAL SPINE WITHOUT CONTRAST TECHNIQUE: Multidetector CT imaging of the head and cervical spine was performed following the standard protocol without intravenous contrast. Multiplanar CT image reconstructions of the cervical spine were also generated. COMPARISON:  Head and cervical spine CT 12/30/2020. FINDINGS: CT HEAD FINDINGS Brain: Mild cerebral atrophy. Patchy and confluent areas of decreased attenuation are noted throughout the deep and periventricular white matter of the cerebral hemispheres bilaterally, compatible with chronic microvascular ischemic disease. Physiologic calcifications in the basal ganglia bilaterally. No evidence of acute infarction, hemorrhage, hydrocephalus, extra-axial collection or mass lesion/mass effect. Vascular: No hyperdense vessel or unexpected calcification. Skull: Normal. Negative  for fracture or focal lesion. Sinuses/Orbits: No acute finding. Other: None. CT CERVICAL SPINE FINDINGS Alignment: Reversal of normal cervical lordosis centered at the level of C3, likely chronic. Alignment is otherwise anatomic. Skull base and vertebrae: No acute fracture. No primary bone lesion or focal pathologic process. Soft tissues and spinal canal: No prevertebral fluid or swelling. No visible canal hematoma. Disc levels: Multilevel degenerative disc disease, most evident at C5-C6 and C6-C7. Moderate multilevel facet arthropathy bilaterally. Upper chest: Scarring in the lung apices. Other: None. IMPRESSION: 1. No evidence of significant acute traumatic injury to the skull, brain or cervical spine. 2. Mild cerebral atrophy with extensive chronic microvascular ischemic changes in the cerebral white matter, as above. 3. Multilevel degenerative disc disease and cervical spondylosis, as above. Electronically Signed   By: Vinnie Langton M.D.   On: 01/11/2021 14:50   CT CERVICAL SPINE WO CONTRAST  Result Date: 12/30/2020 CLINICAL DATA:  Fall.  Neck pain and headache. EXAM: CT HEAD WITHOUT CONTRAST CT CERVICAL SPINE WITHOUT CONTRAST TECHNIQUE: Multidetector CT imaging of the head and cervical spine was performed following the standard protocol without intravenous contrast. Multiplanar CT image reconstructions of the cervical spine were also generated. COMPARISON:  Head CT 08/10/2020 FINDINGS: CT HEAD FINDINGS Brain:  No acute intracranial hemorrhage. No focal mass lesion. No CT evidence of acute infarction. No midline shift or mass effect. No hydrocephalus. Basilar cisterns are patent. There are periventricular and subcortical white matter hypodensities. Generalized cortical atrophy. Vascular: No hyperdense vessel or unexpected calcification. Skull: Normal. Negative for fracture or focal lesion. Sinuses/Orbits: Paranasal sinuses and mastoid air cells are clear. Orbits are clear. Other: None. CT CERVICAL SPINE  FINDINGS Alignment: Normal alignment of the cervical vertebral bodies. Skull base and vertebrae: Normal craniocervical junction. No loss of vertebral body height or disc height. Normal facet articulation. No evidence of fracture. Soft tissues and spinal canal: No prevertebral soft tissue swelling. No perispinal or epidural hematoma. Disc levels: There is disc space narrowing and endplate sclerosis from C5-C7. Acute findings. Mild endplate spurring is levels Upper chest: Clear Other: None IMPRESSION: 1. No acute intracranial trauma. 2. Chronic atrophy and white matter microvascular disease. 3. No cervical spine fracture. 4. Multilevel disc osteophytic disease. Electronically Signed   By: Suzy Bouchard M.D.   On: 12/30/2020 12:58   MR LUMBAR SPINE WO CONTRAST  Result Date: 01/06/2021 CLINICAL DATA:  Nontraumatic compression fracture of L3 vertebra, initial encounter (HCC) M48.56XA (ICD-10-CM) Increasing back pain. EXAM: MRI LUMBAR SPINE WITHOUT CONTRAST TECHNIQUE: Multiplanar, multisequence MR imaging of the lumbar spine was performed. No intravenous contrast was administered. COMPARISON:  MRI lumbar spine 11/04/2019. FINDINGS: Segmentation: Standard segmentation, consistent with prior numbering. Alignment:  Similar slight retrolisthesis of L5 on S1. Vertebrae: Prior kyphoplasties at L1, L2, and L4. Prior T12, L1, L2, and L4 vertebral body compression fractures. Mild progression of height loss at L1, now approximately 50% (previously 40%). Additionally, there is mild marrow edema along the the L1 superior endplate in the region of height loss, suspicious for acute/recent fracture. Otherwise, vertebral body heights are similar. Conus medullaris and cauda equina: Conus extends to the L1 level. Conus appears normal. Paraspinal and other soft tissues: Bilateral renal cyst. Paraspinal muscular atrophy. Disc levels: T12-L1: Small central disc protrusion with caudal migration, similar. Mild bilateral facet  arthropathy. No evidence of significant canal or foraminal stenosis. L1-L2: Mild disc bulging and mild bilateral facet arthropathy. Similar mild bilateral foraminal stenosis. No significant canal stenosis. L2-L3: Disc bulging and moderate bilateral facet arthropathy. Similar bilateral subarticular recess stenosis and mild bilateral foraminal stenosis. No significant central canal stenosis. L3-L4: Broad disc bulge with endplate spurring. Similar moderate canal stenosis with left greater than right subarticular recess stenosis. Similar mild-to-moderate left greater than right foraminal stenosis. L4-L5: Broad disc bulge with endplate spurring. Moderate bilateral facet hypertrophy and bulky ligamentum flavum thickening. Resulting severe canal stenosis and right greater than left subarticular recess stenosis, progressed from prior. Similar mild-to-moderate bilateral foraminal stenosis. L5-S1: Broad disc bulge and moderate bilateral facet arthropathy with ligamentum flavum thickening. Resulting mild-to-moderate central canal stenosis and moderate bilateral subarticular recess stenosis, similar. Mild bilateral foraminal stenosis. IMPRESSION: 1. Interval mild progression of height loss at L1 (now 50%) with mild marrow edema involving the superior endplate, compatible with acute/recent on chronic compression fracture. 2. Severe canal stenosis at L4-L5, which has progressed since 2021. 3. Otherwise, similar multilevel degenerative change that is detailed above. 4. Additional chronic T12, L2, and L4 compression fractures with similar height loss. Electronically Signed   By: Margaretha Sheffield M.D.   On: 01/06/2021 13:28      Subjective: Patient reports continued pain, although does admit that pain is better after receiving pain medications  Discharge Exam: Vitals:   01/14/21 8299 01/14/21 0721 01/14/21 1111  01/14/21 1513  BP: 134/66 (!) 170/56 (!) 142/56 (!) 158/59  Pulse: 61 63 61 68  Resp: 16 14 16 16   Temp: 98.2  F (36.8 C) (!) 97.5 F (36.4 C) 98 F (36.7 C) 97.7 F (36.5 C)  TempSrc:      SpO2: 93% 94% 97% (!) 78%  Weight:      Height:        General: Pt is alert, awake, not in acute distress Cardiovascular: RRR, S1/S2 +, no rubs, no gallops Respiratory: CTA bilaterally, no wheezing, no rhonchi Abdominal: Soft, NT, ND, bowel sounds + Extremities: no edema, no cyanosis    The results of significant diagnostics from this hospitalization (including imaging, microbiology, ancillary and laboratory) are listed below for reference.     Microbiology: Recent Results (from the past 240 hour(s))  Resp Panel by RT-PCR (Flu A&B, Covid) Nasopharyngeal Swab     Status: None   Collection Time: 01/11/21  3:42 PM   Specimen: Nasopharyngeal Swab; Nasopharyngeal(NP) swabs in vial transport medium  Result Value Ref Range Status   SARS Coronavirus 2 by RT PCR NEGATIVE NEGATIVE Final    Comment: (NOTE) SARS-CoV-2 target nucleic acids are NOT DETECTED.  The SARS-CoV-2 RNA is generally detectable in upper respiratory specimens during the acute phase of infection. The lowest concentration of SARS-CoV-2 viral copies this assay can detect is 138 copies/mL. A negative result does not preclude SARS-Cov-2 infection and should not be used as the sole basis for treatment or other patient management decisions. A negative result may occur with  improper specimen collection/handling, submission of specimen other than nasopharyngeal swab, presence of viral mutation(s) within the areas targeted by this assay, and inadequate number of viral copies(<138 copies/mL). A negative result must be combined with clinical observations, patient history, and epidemiological information. The expected result is Negative.  Fact Sheet for Patients:  EntrepreneurPulse.com.au  Fact Sheet for Healthcare Providers:  IncredibleEmployment.be  This test is no t yet approved or cleared by the [place] FDA and  has been authorized for detection and/or diagnosis of SARS-CoV-2 by FDA under an Emergency Use Authorization (EUA). This EUA will remain  in effect (meaning this test can be used) for the duration of the COVID-19 declaration under Section 564(b)(1) of the Act, 21 U.S.C.section 360bbb-3(b)(1), unless the authorization is terminated  or revoked sooner.       Influenza A by PCR NEGATIVE NEGATIVE Final   Influenza B by PCR NEGATIVE NEGATIVE Final    Comment: (NOTE) The Xpert Xpress SARS-CoV-2/FLU/RSV plus assay is intended as an aid in the diagnosis of influenza from Nasopharyngeal swab specimens and should not be used as a sole basis for treatment. Nasal washings and aspirates are unacceptable for Xpert Xpress SARS-CoV-2/FLU/RSV testing.  Fact Sheet for Patients: EntrepreneurPulse.com.au  Fact Sheet for Healthcare Providers: IncredibleEmployment.be  This test is not yet approved or cleared by the [place] FDA and has been authorized for detection and/or diagnosis of SARS-CoV-2 by FDA under an Emergency Use Authorization (EUA). This EUA will remain in effect (meaning this test can be used) for the duration of the COVID-19 declaration under Section 564(b)(1) of the Act, 21 U.S.C. section 360bbb-3(b)(1), unless the authorization is terminated or revoked.  Performed at [facility], [address] [place], [place] [address]      Labs: BNP (last 3 results) Recent Labs    01/11/21 1317  BNP 606.3*   Basic Metabolic Panel: Recent Labs  Lab 01/11/21 1317  NA 136  K 4.1  CL 97*  CO2 30  GLUCOSE 114*  BUN 19  CREATININE 0.85  CALCIUM 8.8*   Liver Function Tests: No results for input(s): AST, ALT, ALKPHOS, BILITOT, PROT, ALBUMIN in the last 168 hours. No results for input(s): LIPASE, AMYLASE in the last 168 hours. No results for input(s): AMMONIA in the last 168 hours. CBC: Recent Labs  Lab  01/11/21 1317  WBC 7.6  NEUTROABS 4.8  HGB 13.3  HCT 41.0  MCV 93.2  PLT 147*   Cardiac Enzymes: No results for input(s): CKTOTAL, CKMB, CKMBINDEX, TROPONINI in the last 168 hours. BNP: Invalid input(s): POCBNP CBG: No results for input(s): GLUCAP in the last 168 hours. D-Dimer No results for input(s): DDIMER in the last 72 hours. Hgb A1c No results for input(s): HGBA1C in the last 72 hours. Lipid Profile No results for input(s): CHOL, HDL, LDLCALC, TRIG, CHOLHDL, LDLDIRECT in the last 72 hours. Thyroid function studies No results for input(s): TSH, T4TOTAL, T3FREE, THYROIDAB in the last 72 hours.  Invalid input(s): FREET3 Anemia work up No results for input(s): VITAMINB12, FOLATE, FERRITIN, TIBC, IRON, RETICCTPCT in the last 72 hours. Urinalysis    Component Value Date/Time   COLORURINE YELLOW (A) 02/08/2020 2047   APPEARANCEUR CLEAR (A) 02/08/2020 2047   LABSPEC 1.006 02/08/2020 2047   PHURINE 7.0 02/08/2020 2047   GLUCOSEU NEGATIVE 02/08/2020 2047   HGBUR SMALL (A) 02/08/2020 2047   BILIRUBINUR NEGATIVE 02/08/2020 2047   KETONESUR NEGATIVE 02/08/2020 2047   PROTEINUR NEGATIVE 02/08/2020 2047   NITRITE POSITIVE (A) 02/08/2020 2047   LEUKOCYTESUR NEGATIVE 02/08/2020 2047   Sepsis Labs Invalid input(s): PROCALCITONIN,  WBC,  LACTICIDVEN Microbiology Recent Results (from the past 240 hour(s))  Resp Panel by RT-PCR (Flu A&B, Covid) Nasopharyngeal Swab     Status: None   Collection Time: 01/11/21  3:42 PM   Specimen: Nasopharyngeal Swab; Nasopharyngeal(NP) swabs in vial transport medium  Result Value Ref Range Status   SARS Coronavirus 2 by RT PCR NEGATIVE NEGATIVE Final    Comment: (NOTE) SARS-CoV-2 target nucleic acids are NOT DETECTED.  The SARS-CoV-2 RNA is generally detectable in upper respiratory specimens during the acute phase of infection. The lowest concentration of SARS-CoV-2 viral copies this assay can detect is 138 copies/mL. A negative result does  not preclude SARS-Cov-2 infection and should not be used as the sole basis for treatment or other patient management decisions. A negative result may occur with  improper specimen collection/handling, submission of specimen other than nasopharyngeal swab, presence of viral mutation(s) within the areas targeted by this assay, and inadequate number of viral copies(<138 copies/mL). A negative result must be combined with clinical observations, patient history, and epidemiological information. The expected result is Negative.  Fact Sheet for Patients:  EntrepreneurPulse.com.au  Fact Sheet for Healthcare Providers:  IncredibleEmployment.be  This test is no t yet approved or cleared by the [place] FDA and  has been authorized for detection and/or diagnosis of SARS-CoV-2 by FDA under an Emergency Use Authorization (EUA). This EUA will remain  in effect (meaning this test can be used) for the duration of the COVID-19 declaration under Section 564(b)(1) of the Act, 21 U.S.C.section 360bbb-3(b)(1), unless the authorization is terminated  or revoked sooner.       Influenza A by PCR NEGATIVE NEGATIVE Final   Influenza B by PCR NEGATIVE NEGATIVE Final    Comment: (NOTE) The Xpert Xpress SARS-CoV-2/FLU/RSV plus assay is intended as an aid in the diagnosis of influenza from Nasopharyngeal swab  specimens and should not be used as a sole basis for treatment. Nasal washings and aspirates are unacceptable for Xpert Xpress SARS-CoV-2/FLU/RSV testing.  Fact Sheet for Patients: EntrepreneurPulse.com.au  Fact Sheet for Healthcare Providers: IncredibleEmployment.be  This test is not yet approved or cleared by the [place] FDA and has been authorized for detection and/or diagnosis of SARS-CoV-2 by FDA under an Emergency Use Authorization (EUA). This EUA will remain in effect (meaning this test can be used) for the  duration of the COVID-19 declaration under Section 564(b)(1) of the Act, 21 U.S.C. section 360bbb-3(b)(1), unless the authorization is terminated or revoked.  Performed at [facility], [address], [place], [place] [address]      Time coordinating discharge: 10mins  SIGNED:   Kathie Dike, MD  [facility] 01/14/2021, 6:11 PM   If 7PM-7AM, please contact night-coverage [email or web address]

## 2021-01-14 NOTE — Progress Notes (Signed)
Physical Therapy Treatment Patient Details Name: Caroline Aguilar MRN: [id number] DOB: [birth year] Today's Date: 01/14/2021   History of Present Illness Pt is a 87 y.o. female with medical history significant of hypertension, hyperlipidemia, stroke, GERD, depression, colon cancer, atrial fibrillation on Eliquis, neuropathy, dCHF, frequent fall, compression fracture of the spine, who presents with multiple falls.  MD assessment includes: acute and chronic right rib fractures and chronic left rib fractures, L ankle sprain, and falls.  Pt/family also endorse recent diagnosis of lumbar compression fracture.    PT Comments    Pt ready for session with encouragement.  To EOB with mod a x 1 and encouragement/increased time.  Steady in sitting at EOB for an extended period of time.  She is able to stand x 2 to RW with mod a x 1 but is not able to step in place or move her feet.  +2 is called for pt and staff safety and she is able to take several small steps to chair with min a x 2.  Remained in recliner after session.  SNF remains appropriate for discharge.  Supportive daughter in law in room.   Recommendations for follow up therapy are one component of a multi-disciplinary discharge planning process, led by the attending physician.  Recommendations may be updated based on patient status, additional functional criteria and insurance authorization.  Follow Up Recommendations  SNF;Supervision/Assistance - 24 hour     Equipment Recommendations  None recommended by PT    Recommendations for Other Services       Precautions / Restrictions Precautions Precautions: Fall Required Braces or Orthoses: Other Brace Other Brace: Cam boot to L ankle Restrictions Weight Bearing Restrictions: No     Mobility  Bed Mobility Overal bed mobility: Needs Assistance Bed Mobility: Supine to Sit   Sidelying to sit: Mod assist;HOB elevated       General bed mobility comments: increased time, assist for  trunk    Transfers Overall transfer level: Needs assistance Equipment used: Rolling walker (2 wheeled) Transfers: Sit to/from Stand Sit to Stand: Mod assist;From elevated surface            Ambulation/Gait Ambulation/Gait assistance: Min assist;+2 physical assistance Gait Distance (Feet): 3 Feet Assistive device: Rolling walker (2 wheeled) Gait Pattern/deviations: Trunk flexed;Shuffle;Step-to pattern Gait velocity: decreased   General Gait Details: Pt able to take several very small, shuffling steps to chair at bedside   Stairs             Wheelchair Mobility    Modified Rankin (Stroke Patients Only)       Balance Overall balance assessment: Needs assistance;History of Falls Sitting-balance support: Feet supported Sitting balance-Leahy Scale: Fair     Standing balance support: Bilateral upper extremity supported;During functional activity Standing balance-Leahy Scale: Poor Standing balance comment: Pt requires extensive UE Support to sustain static stand                            Cognition Arousal/Alertness: Awake/alert Behavior During Therapy: WFL for tasks assessed/performed Overall Cognitive Status: Within Functional Limits for tasks assessed                                        Exercises Other Exercises Other Exercises: seated AROM    General Comments        Pertinent Vitals/Pain Pain Assessment: Faces Faces Pain Scale:  Hurts even more Pain Location: General back, RLE, R side, and L ankle pain Pain Descriptors / Indicators: Grimacing;Moaning;Aching;Sore Pain Intervention(s): Limited activity within patient's tolerance;Monitored during session;Repositioned    Home Living                      Prior Function            PT Goals (current goals can now be found in the care plan section) Progress towards PT goals: Progressing toward goals    Frequency    Min 2X/week      PT Plan Current plan  remains appropriate    Co-evaluation              AM-PAC PT "6 Clicks" Mobility   Outcome Measure  Help needed turning from your back to your side while in a flat bed without using bedrails?: A Lot Help needed moving from lying on your back to sitting on the side of a flat bed without using bedrails?: A Lot Help needed moving to and from a bed to a chair (including a wheelchair)?: A Lot Help needed standing up from a chair using your arms (e.g., wheelchair or bedside chair)?: A Lot Help needed to walk in hospital room?: A Lot Help needed climbing 3-5 steps with a railing? : Total 6 Click Score: 11    End of Session Equipment Utilized During Treatment: Gait belt Activity Tolerance: Patient tolerated treatment well Patient left: with call bell/phone within reach;with family/visitor present;in chair;with chair alarm set Nurse Communication: Mobility status PT Visit Diagnosis: Unsteadiness on feet (R26.81);History of falling (Z91.81);Muscle weakness (generalized) (M62.81);Difficulty in walking, not elsewhere classified (R26.2);Pain Pain - Right/Left: Left Pain - part of body: Ankle and joints of foot;Hip     Time: 0950-1020 PT Time Calculation (min) (ACUTE ONLY): 30 min  Charges:  $Therapeutic Exercise: 8-22 mins $Therapeutic Activity: 8-22 mins                    Chesley Noon, PTA 01/14/21, 10:31 AM

## 2021-01-14 NOTE — TOC Progression Note (Signed)
Transition of Care [facility]) - Progression Note    Patient Details  Name: Caroline Aguilar MRN: [id number] Date of Birth: 28-Apr-1925  Transition of Care [facility]) CM/SW [place], RN Phone Number: 01/14/2021, 4:45 PM  Clinical Narrative:  Patient agrees to [facility] bed offer. Magda Paganini called patient can discharge Monday. Authorization to be done by [facility], not managed by [facility].           Expected Discharge Plan and Services                                                 Social Determinants of Health (SDOH) Interventions    Readmission Risk Interventions No flowsheet data found.

## 2021-01-14 NOTE — Progress Notes (Signed)
Occupational Therapy Treatment Patient Details Name: Caroline Aguilar MRN: [id number] DOB: [birth year] Today's Date: 01/14/2021   History of present illness Pt is a 87 y.o. female with medical history significant of hypertension, hyperlipidemia, stroke, GERD, depression, colon cancer, atrial fibrillation on Eliquis, neuropathy, dCHF, frequent fall, compression fracture of the spine, who presents with multiple falls.  MD assessment includes: acute and chronic right rib fractures and chronic left rib fractures, L ankle sprain, and falls.  Pt/family also endorse recent diagnosis of lumbar compression fracture.   OT comments  Pt seen for OT tx to f/u re: safety with ADLs/ADL mobility. OT engages pt in the following bed level exercises: QS x15 per side, GS x15, FWD reaching/modified sit up with UE assistance x5 per side, alternating (as tolerable considering rib pain). Pt tolerated well and reports she is surprisingly not hurting. Pt left in bed with all needs met and in reach. All exercises performed are aimed to improve strength required for fall prevention with performance of ADLs. Will continue to follow.    Recommendations for follow up therapy are one component of a multi-disciplinary discharge planning process, led by the attending physician.  Recommendations may be updated based on patient status, additional functional criteria and insurance authorization.    Follow Up Recommendations  SNF    Equipment Recommendations  3 in 1 bedside commode;Tub/shower seat    Recommendations for Other Services      Precautions / Restrictions Precautions Precautions: Fall Required Braces or Orthoses: Other Brace Other Brace: Cam boot to L ankle Restrictions Weight Bearing Restrictions: No       Mobility Bed Mobility                    Transfers                      Balance                                           ADL either performed or assessed with  clinical judgement   ADL                                               Vision Patient Visual Report: No change from baseline     Perception     Praxis      Cognition Arousal/Alertness: Awake/alert Behavior During Therapy: WFL for tasks assessed/performed Overall Cognitive Status: Within Functional Limits for tasks assessed                                          Exercises Other Exercises Other Exercises: OT engages pt in the following bed level exercises: QS x15 per side, GS x15, FWD reaching/modified sit up with UE assistance x5 per side, alternating (as tolerable considering rib pain). Pt tolerated well and reports she is surprisingly not hurting.   Shoulder Instructions       General Comments      Pertinent Vitals/ Pain       Pain Assessment: Faces Faces Pain Scale: Hurts little more Pain Location: General back, RLE, R side, and L ankle pain Pain Descriptors / Indicators:  Grimacing;Moaning;Aching;Sore Pain Intervention(s): Monitored during session;Limited activity within patient's tolerance;Repositioned  Home Living                                          Prior Functioning/Environment              Frequency  Min 1X/week        Progress Toward Goals  OT Goals(current goals can now be found in the care plan section)  Progress towards OT goals: Progressing toward goals  Acute Rehab OT Goals Patient Stated Goal: to address back pain and fall less. OT Goal Formulation: With patient Time For Goal Achievement: 01/26/21 Potential to Achieve Goals: Good  Plan Discharge plan remains appropriate    Co-evaluation                 AM-PAC OT "6 Clicks" Daily Activity     Outcome Measure   Help from another person eating meals?: None Help from another person taking care of personal grooming?: A Little Help from another person toileting, which includes using toliet, bedpan, or urinal?: A  Lot Help from another person bathing (including washing, rinsing, drying)?: A Lot Help from another person to put on and taking off regular upper body clothing?: A Little Help from another person to put on and taking off regular lower body clothing?: A Lot 6 Click Score: 16    End of Session    OT Visit Diagnosis: Unsteadiness on feet (R26.81);Muscle weakness (generalized) (M62.81);History of falling (Z91.81);Repeated falls (R29.6);Pain   Activity Tolerance Patient tolerated treatment well   Patient Left Other (comment);with nursing/sitter in room;with call bell/phone within reach   Nurse Communication Mobility status        Time: 4299-8069 OT Time Calculation (min): 31 min  Charges: OT General Charges $OT Visit: 1 Visit OT Treatments $Therapeutic Exercise: 23-37 mins  Gerrianne Scale, [place], OTR/L ascom [phone] 01/14/21, 4:43 PM

## 2021-01-15 DIAGNOSIS — W19XXXA Unspecified fall, initial encounter: Secondary | ICD-10-CM | POA: Diagnosis not present

## 2021-01-15 DIAGNOSIS — S2243XA Multiple fractures of ribs, bilateral, initial encounter for closed fracture: Secondary | ICD-10-CM | POA: Diagnosis not present

## 2021-01-15 DIAGNOSIS — I480 Paroxysmal atrial fibrillation: Secondary | ICD-10-CM | POA: Diagnosis not present

## 2021-01-15 DIAGNOSIS — I5032 Chronic diastolic (congestive) heart failure: Secondary | ICD-10-CM | POA: Diagnosis not present

## 2021-01-15 LAB — CBC
HCT: 38.5 % (ref 36.0–46.0)
Hemoglobin: 13.2 g/dL (ref 12.0–15.0)
MCH: 31.3 pg (ref 26.0–34.0)
MCHC: 34.3 g/dL (ref 30.0–36.0)
MCV: 91.2 fL (ref 80.0–100.0)
Platelets: 172 10*3/uL (ref 150–400)
RBC: 4.22 MIL/uL (ref 3.87–5.11)
RDW: 12.3 % (ref 11.5–15.5)
WBC: 7.3 10*3/uL (ref 4.0–10.5)
nRBC: 0 % (ref 0.0–0.2)

## 2021-01-15 LAB — BASIC METABOLIC PANEL
Anion gap: 8 (ref 5–15)
BUN: 20 mg/dL (ref 8–23)
CO2: 29 mmol/L (ref 22–32)
Calcium: 8.8 mg/dL — ABNORMAL LOW (ref 8.9–10.3)
Chloride: 95 mmol/L — ABNORMAL LOW (ref 98–111)
Creatinine, Ser: 0.67 mg/dL (ref 0.44–1.00)
GFR, Estimated: >60 mL/min (ref >60)
Glucose, Bld: 96 mg/dL (ref 70–99)
Potassium: 4.1 mmol/L (ref 3.5–5.1)
Sodium: 132 mmol/L — ABNORMAL LOW (ref 135–145)

## 2021-01-15 NOTE — Progress Notes (Signed)
PROGRESS NOTE    Caroline Aguilar  NFA:[id number] DOB: [birth year] DOA: 01/11/2021 PCP: [name], MD    Brief Narrative:  87 year old female with a history of hypertension, hyperlipidemia, prior stroke, atrial fibrillation on anticoagulation, diastolic heart failure, admitted to the hospital with multiple falls.  She was noted to have acute right-sided rib fractures.  She also complained of back pain and recent imaging showed acute on chronic lumbar compression fracture.  She was admitted for pain management.  Orthopedics consulted to see if she would be a candidate for kyphoplasty.  Seen by PT with recommendation for skilled nursing facility placement.   Assessment & Plan:   Principal Problem:   Rib fracture Active Problems:   AF (paroxysmal atrial fibrillation) (HCC)   Stroke (HCC)   HLD (hyperlipidemia)   (HFpEF) heart failure with preserved ejection fraction ([place])   Fall   Depression   Compression fracture of spine (HCC)   HTN (hypertension)   Right-sided rib fractures status post fall -Continue supportive management with pain management -Incentive spirometry -Physical therapy  Back pain with lumbar compression fractures status post fall -History of the same where she has undergone kyphoplasty and multiple lumbar vertebra -Recent MRI from 9/22 shows acute lumbar compression fracture of L1 vertebra, although it is also commented that she has had kyphoplasty at L1 in the past -Reviewed by Dr. Rudene Christians, and it was felt that patient's pain likely emanated from lower than L1.  Further kyphoplasty or operative management at this time was not recommended.  MRI also did not reveal any evidence of sacral insufficiency fracture -It was felt that she has severe L4-L5 lumbar stenosis which could be contributing to her discomfort.  Recommendations were to follow-up in pain clinic for epidural steroid injection. -Recommendations discussed with patient and she said that she has received a  steroid injection in the past and "began hallucinating".  She has done well with low-dose prednisone in the past.  Currently on prednisone course.  Paroxysmal atrial fibrillation -Heart rate is currently stable -She is anticoagulated with apixaban  Hypertension -Continue on amlodipine  Chronic diastolic congestive heart failure -Appears to be compensated -Continue current management  Recurrent falls -Seen by physical therapy with recommendation for skilled nursing facility placement -TOC assistance requested -She will hopefully discharge on 10/3   DVT prophylaxis: apixaban (ELIQUIS) tablet 2.5 mg Start: 01/12/21 0200 apixaban (ELIQUIS) tablet 2.5 mg  Code Status: Full code Family Communication: Discussed with daughter at the bedside Disposition Plan: Status is: Observation  The patient remains OBS appropriate and will d/c before 2 midnights.  Dispo: The patient is from: Home              Anticipated d/c is to: SNF              Patient currently is medically stable to d/c.   Difficult to place patient No         Consultants:  Orthopedics  Procedures:    Antimicrobials:      Subjective: Reports continued pain.  Feels sleepy after pain medications  Objective: Vitals:   01/15/21 0814 01/15/21 1132 01/15/21 1511 01/15/21 2024  BP: (!) 160/78 (!) 136/57 132/61 (!) 160/65  Pulse: 70 64 72 69  Resp: 14 15 15 17   Temp: 98.5 F (36.9 C) 98.4 F (36.9 C) 98.4 F (36.9 C) 98.1 F (36.7 C)  TempSrc:      SpO2: 93% 93% 94% 95%  Weight:      Height:  Intake/Output Summary (Last 24 hours) at 01/15/2021 2025 Last data filed at 01/15/2021 1857 Gross per 24 hour  Intake 1500 ml  Output 1500 ml  Net 0 ml   Filed Weights   01/12/21 2011 01/13/21 0500 01/15/21 0500  Weight: 75.9 kg 75.8 kg 76.2 kg    Examination:  General exam: Appears calm and comfortable  Respiratory system: Clear to auscultation. Respiratory effort normal. Cardiovascular system:  S1 & S2 heard, RRR. No JVD, murmurs, rubs, gallops or clicks. No pedal edema. Gastrointestinal system: Abdomen is nondistended, soft and nontender. No organomegaly or masses felt. Normal bowel sounds heard. Central nervous system: Alert and oriented. No focal neurological deficits. Extremities: Symmetric 5 x 5 power. Skin: No rashes, lesions or ulcers Psychiatry: Judgement and insight appear normal. Mood & affect appropriate.     Data Reviewed: I have personally reviewed following labs and imaging studies  CBC: Recent Labs  Lab 01/11/21 1317 01/15/21 0436  WBC 7.6 7.3  NEUTROABS 4.8  --   HGB 13.3 13.2  HCT 41.0 38.5  MCV 93.2 91.2  PLT 147* 416   Basic Metabolic Panel: Recent Labs  Lab 01/11/21 1317 01/15/21 0436  NA 136 132*  K 4.1 4.1  CL 97* 95*  CO2 30 29  GLUCOSE 114* 96  BUN 19 20  CREATININE 0.85 0.67  CALCIUM 8.8* 8.8*   GFR: Estimated Creatinine Clearance: 40.1 mL/min (by C-G formula based on SCr of 0.67 mg/dL). Liver Function Tests: No results for input(s): AST, ALT, ALKPHOS, BILITOT, PROT, ALBUMIN in the last 168 hours. No results for input(s): LIPASE, AMYLASE in the last 168 hours. No results for input(s): AMMONIA in the last 168 hours. Coagulation Profile: No results for input(s): INR, PROTIME in the last 168 hours. Cardiac Enzymes: No results for input(s): CKTOTAL, CKMB, CKMBINDEX, TROPONINI in the last 168 hours. BNP (last 3 results) No results for input(s): PROBNP in the last 8760 hours. HbA1C: No results for input(s): HGBA1C in the last 72 hours. CBG: No results for input(s): GLUCAP in the last 168 hours. Lipid Profile: No results for input(s): CHOL, HDL, LDLCALC, TRIG, CHOLHDL, LDLDIRECT in the last 72 hours. Thyroid Function Tests: No results for input(s): TSH, T4TOTAL, FREET4, T3FREE, THYROIDAB in the last 72 hours. Anemia Panel: No results for input(s): VITAMINB12, FOLATE, FERRITIN, TIBC, IRON, RETICCTPCT in the last 72 hours. Sepsis  Labs: No results for input(s): PROCALCITON, LATICACIDVEN in the last 168 hours.  Recent Results (from the past 240 hour(s))  Resp Panel by RT-PCR (Flu A&B, Covid) Nasopharyngeal Swab     Status: None   Collection Time: 01/11/21  3:42 PM   Specimen: Nasopharyngeal Swab; Nasopharyngeal(NP) swabs in vial transport medium  Result Value Ref Range Status   SARS Coronavirus 2 by RT PCR NEGATIVE NEGATIVE Final    Comment: (NOTE) SARS-CoV-2 target nucleic acids are NOT DETECTED.  The SARS-CoV-2 RNA is generally detectable in upper respiratory specimens during the acute phase of infection. The lowest concentration of SARS-CoV-2 viral copies this assay can detect is 138 copies/mL. A negative result does not preclude SARS-Cov-2 infection and should not be used as the sole basis for treatment or other patient management decisions. A negative result may occur with  improper specimen collection/handling, submission of specimen other than nasopharyngeal swab, presence of viral mutation(s) within the areas targeted by this assay, and inadequate number of viral copies(<138 copies/mL). A negative result must be combined with clinical observations, patient history, and epidemiological information. The expected result is Negative.  Fact Sheet for Patients:  EntrepreneurPulse.com.au  Fact Sheet for Healthcare Providers:  IncredibleEmployment.be  This test is no t yet approved or cleared by the [place] FDA and  has been authorized for detection and/or diagnosis of SARS-CoV-2 by FDA under an Emergency Use Authorization (EUA). This EUA will remain  in effect (meaning this test can be used) for the duration of the COVID-19 declaration under Section 564(b)(1) of the Act, 21 U.S.C.section 360bbb-3(b)(1), unless the authorization is terminated  or revoked sooner.       Influenza A by PCR NEGATIVE NEGATIVE Final   Influenza B by PCR NEGATIVE NEGATIVE Final     Comment: (NOTE) The Xpert Xpress SARS-CoV-2/FLU/RSV plus assay is intended as an aid in the diagnosis of influenza from Nasopharyngeal swab specimens and should not be used as a sole basis for treatment. Nasal washings and aspirates are unacceptable for Xpert Xpress SARS-CoV-2/FLU/RSV testing.  Fact Sheet for Patients: EntrepreneurPulse.com.au  Fact Sheet for Healthcare Providers: IncredibleEmployment.be  This test is not yet approved or cleared by the [place] FDA and has been authorized for detection and/or diagnosis of SARS-CoV-2 by FDA under an Emergency Use Authorization (EUA). This EUA will remain in effect (meaning this test can be used) for the duration of the COVID-19 declaration under Section 564(b)(1) of the Act, 21 U.S.C. section 360bbb-3(b)(1), unless the authorization is terminated or revoked.  Performed at [facility], [address], [place], [place] [address]          Radiology Studies: No results found.      Scheduled Meds:  amLODipine  2.5 mg Oral BID   apixaban  2.5 mg Oral BID   buPROPion  150 mg Oral BH-q7a   docusate sodium  100 mg Oral Daily   gabapentin  100 mg Oral QHS   latanoprost  1 drop Both Eyes QHS   lidocaine  1 patch Transdermal Q24H   pantoprazole  40 mg Oral Daily   predniSONE  10 mg Oral Q breakfast   sertraline  50 mg Oral BH-q7a   simvastatin  20 mg Oral q1800   timolol  1 drop Both Eyes BID   Continuous Infusions:   LOS: 0 days    Time spent: 54mins    Kathie Dike, MD [facility]   If 7PM-7AM, please contact night-coverage [email or web address]  01/15/2021, 8:25 PM

## 2021-01-16 DIAGNOSIS — S2243XA Multiple fractures of ribs, bilateral, initial encounter for closed fracture: Secondary | ICD-10-CM | POA: Diagnosis not present

## 2021-01-16 DIAGNOSIS — W19XXXA Unspecified fall, initial encounter: Secondary | ICD-10-CM | POA: Diagnosis not present

## 2021-01-16 DIAGNOSIS — I5032 Chronic diastolic (congestive) heart failure: Secondary | ICD-10-CM | POA: Diagnosis not present

## 2021-01-16 DIAGNOSIS — I480 Paroxysmal atrial fibrillation: Secondary | ICD-10-CM | POA: Diagnosis not present

## 2021-01-16 MED ORDER — PREDNISONE 20 MG PO TABS
20.0000 mg | ORAL_TABLET | Freq: Every day | ORAL | Status: DC
  Administered 2021-01-17 – 2021-01-18 (×2): 20 mg via ORAL
  Filled 2021-01-16 (×2): qty 1

## 2021-01-16 MED ORDER — PREDNISONE 10 MG PO TABS
10.0000 mg | ORAL_TABLET | ORAL | Status: AC
  Administered 2021-01-16: 10 mg via ORAL
  Filled 2021-01-16: qty 1

## 2021-01-16 NOTE — Progress Notes (Signed)
Vitals entered manually- wouldn't recognize pt ID

## 2021-01-16 NOTE — Plan of Care (Signed)

## 2021-01-16 NOTE — Progress Notes (Signed)
PROGRESS NOTE    Caroline Aguilar  AUQ:[id number] DOB: 1925-07-07 DOA: 01/11/2021 PCP: [name], MD    Brief Narrative:  87 year old female with a history of hypertension, hyperlipidemia, prior stroke, atrial fibrillation on anticoagulation, diastolic heart failure, admitted to the hospital with multiple falls.  She was noted to have acute right-sided rib fractures.  She also complained of back pain and recent imaging showed acute on chronic lumbar compression fracture.  She was admitted for pain management.  Orthopedics consulted to see if she would be a candidate for kyphoplasty.  Seen by PT with recommendation for skilled nursing facility placement.   Assessment & Plan:   Principal Problem:   Rib fracture Active Problems:   AF (paroxysmal atrial fibrillation) (HCC)   Stroke (HCC)   HLD (hyperlipidemia)   (HFpEF) heart failure with preserved ejection fraction ([place])   Fall   Depression   Compression fracture of spine (HCC)   HTN (hypertension)   Right-sided rib fractures status post fall -Continue supportive management with pain management -Incentive spirometry -Physical therapy  Back pain with lumbar compression fractures status post fall -History of the same where she has undergone kyphoplasty and multiple lumbar vertebra -Recent MRI from 9/22 shows acute lumbar compression fracture of L1 vertebra, although it is also commented that she has had kyphoplasty at L1 in the past -Reviewed by Dr. Rudene Christians, and it was felt that patient's pain likely emanated from lower than L1.  Further kyphoplasty or operative management at this time was not recommended.  MRI also did not reveal any evidence of sacral insufficiency fracture -It was felt that she has severe L4-L5 lumbar stenosis which could be contributing to her discomfort.  Recommendations were to follow-up in pain clinic for epidural steroid injection. -Recommendations discussed with patient and she said that she has received a  steroid injection in the past and "began hallucinating".  She has done well with low-dose prednisone in the past.  Currently on prednisone course.  Paroxysmal atrial fibrillation -Heart rate is currently stable -She is anticoagulated with apixaban  Hypertension -Continue on amlodipine  Chronic diastolic congestive heart failure -Appears to be compensated -Continue current management  Recurrent falls -Seen by physical therapy with recommendation for skilled nursing facility placement -TOC assistance requested -She will hopefully discharge on 10/3   DVT prophylaxis: apixaban (ELIQUIS) tablet 2.5 mg Start: 01/12/21 0200 apixaban (ELIQUIS) tablet 2.5 mg  Code Status: Full code Family Communication: Discussed with grand daughter at the bedside Disposition Plan: Status is: Observation  The patient remains OBS appropriate and will d/c before 2 midnights.  Dispo: The patient is from: Home              Anticipated d/c is to: SNF              Patient currently is medically stable to d/c.   Difficult to place patient No         Consultants:  Orthopedics  Procedures:    Antimicrobials:      Subjective: Reports continued pain in ribs with deep breath, continued back pain  Objective: Vitals:   01/16/21 0811 01/16/21 1155 01/16/21 1552 01/16/21 1948  BP: (!) 151/68 (!) 114/55 (!) 137/57 126/62  Pulse: 63 63 72 68  Resp: 16 16 16 14   Temp: 97.9 F (36.6 C) 98.5 F (36.9 C) 98.6 F (37 C) 97.6 F (36.4 C)  TempSrc:      SpO2: 92% 93% 96% 95%  Weight:      Height:  Intake/Output Summary (Last 24 hours) at 01/16/2021 1954 Last data filed at 01/16/2021 1948 Gross per 24 hour  Intake 120 ml  Output 2000 ml  Net -1880 ml   Filed Weights   01/13/21 0500 01/15/21 0500 01/16/21 0500  Weight: 75.8 kg 76.2 kg 75.5 kg    Examination:  General exam: Appears calm and comfortable  Respiratory system: Clear to auscultation. Respiratory effort  normal. Cardiovascular system: S1 & S2 heard, RRR. No JVD, murmurs, rubs, gallops or clicks. No pedal edema. Gastrointestinal system: Abdomen is nondistended, soft and nontender. No organomegaly or masses felt. Normal bowel sounds heard. Central nervous system: Alert and oriented. No focal neurological deficits. Extremities: Symmetric 5 x 5 power. Skin: No rashes, lesions or ulcers Psychiatry: Judgement and insight appear normal. Mood & affect appropriate.     Data Reviewed: I have personally reviewed following labs and imaging studies  CBC: Recent Labs  Lab 01/11/21 1317 01/15/21 0436  WBC 7.6 7.3  NEUTROABS 4.8  --   HGB 13.3 13.2  HCT 41.0 38.5  MCV 93.2 91.2  PLT 147* 440   Basic Metabolic Panel: Recent Labs  Lab 01/11/21 1317 01/15/21 0436  NA 136 132*  K 4.1 4.1  CL 97* 95*  CO2 30 29  GLUCOSE 114* 96  BUN 19 20  CREATININE 0.85 0.67  CALCIUM 8.8* 8.8*   GFR: Estimated Creatinine Clearance: 39.9 mL/min (by C-G formula based on SCr of 0.67 mg/dL). Liver Function Tests: No results for input(s): AST, ALT, ALKPHOS, BILITOT, PROT, ALBUMIN in the last 168 hours. No results for input(s): LIPASE, AMYLASE in the last 168 hours. No results for input(s): AMMONIA in the last 168 hours. Coagulation Profile: No results for input(s): INR, PROTIME in the last 168 hours. Cardiac Enzymes: No results for input(s): CKTOTAL, CKMB, CKMBINDEX, TROPONINI in the last 168 hours. BNP (last 3 results) No results for input(s): PROBNP in the last 8760 hours. HbA1C: No results for input(s): HGBA1C in the last 72 hours. CBG: No results for input(s): GLUCAP in the last 168 hours. Lipid Profile: No results for input(s): CHOL, HDL, LDLCALC, TRIG, CHOLHDL, LDLDIRECT in the last 72 hours. Thyroid Function Tests: No results for input(s): TSH, T4TOTAL, FREET4, T3FREE, THYROIDAB in the last 72 hours. Anemia Panel: No results for input(s): VITAMINB12, FOLATE, FERRITIN, TIBC, IRON, RETICCTPCT  in the last 72 hours. Sepsis Labs: No results for input(s): PROCALCITON, LATICACIDVEN in the last 168 hours.  Recent Results (from the past 240 hour(s))  Resp Panel by RT-PCR (Flu A&B, Covid) Nasopharyngeal Swab     Status: None   Collection Time: 01/11/21  3:42 PM   Specimen: Nasopharyngeal Swab; Nasopharyngeal(NP) swabs in vial transport medium  Result Value Ref Range Status   SARS Coronavirus 2 by RT PCR NEGATIVE NEGATIVE Final    Comment: (NOTE) SARS-CoV-2 target nucleic acids are NOT DETECTED.  The SARS-CoV-2 RNA is generally detectable in upper respiratory specimens during the acute phase of infection. The lowest concentration of SARS-CoV-2 viral copies this assay can detect is 138 copies/mL. A negative result does not preclude SARS-Cov-2 infection and should not be used as the sole basis for treatment or other patient management decisions. A negative result may occur with  improper specimen collection/handling, submission of specimen other than nasopharyngeal swab, presence of viral mutation(s) within the areas targeted by this assay, and inadequate number of viral copies(<138 copies/mL). A negative result must be combined with clinical observations, patient history, and epidemiological information. The expected result is Negative.  Fact Sheet for Patients:  EntrepreneurPulse.com.au  Fact Sheet for Healthcare Providers:  IncredibleEmployment.be  This test is no t yet approved or cleared by the [place] FDA and  has been authorized for detection and/or diagnosis of SARS-CoV-2 by FDA under an Emergency Use Authorization (EUA). This EUA will remain  in effect (meaning this test can be used) for the duration of the COVID-19 declaration under Section 564(b)(1) of the Act, 21 U.S.C.section 360bbb-3(b)(1), unless the authorization is terminated  or revoked sooner.       Influenza A by PCR NEGATIVE NEGATIVE Final   Influenza B by PCR  NEGATIVE NEGATIVE Final    Comment: (NOTE) The Xpert Xpress SARS-CoV-2/FLU/RSV plus assay is intended as an aid in the diagnosis of influenza from Nasopharyngeal swab specimens and should not be used as a sole basis for treatment. Nasal washings and aspirates are unacceptable for Xpert Xpress SARS-CoV-2/FLU/RSV testing.  Fact Sheet for Patients: EntrepreneurPulse.com.au  Fact Sheet for Healthcare Providers: IncredibleEmployment.be  This test is not yet approved or cleared by the [place] FDA and has been authorized for detection and/or diagnosis of SARS-CoV-2 by FDA under an Emergency Use Authorization (EUA). This EUA will remain in effect (meaning this test can be used) for the duration of the COVID-19 declaration under Section 564(b)(1) of the Act, 21 U.S.C. section 360bbb-3(b)(1), unless the authorization is terminated or revoked.  Performed at [facility], [address], [place], [place] [address]          Radiology Studies: No results found.      Scheduled Meds:  amLODipine  2.5 mg Oral BID   apixaban  2.5 mg Oral BID   buPROPion  150 mg Oral BH-q7a   docusate sodium  100 mg Oral Daily   gabapentin  100 mg Oral QHS   latanoprost  1 drop Both Eyes QHS   lidocaine  1 patch Transdermal Q24H   pantoprazole  40 mg Oral Daily   [START ON 01/17/2021] predniSONE  20 mg Oral Q breakfast   sertraline  50 mg Oral BH-q7a   simvastatin  20 mg Oral q1800   timolol  1 drop Both Eyes BID   Continuous Infusions:   LOS: 0 days    Time spent: 55mins    Kathie Dike, MD [facility]   If 7PM-7AM, please contact night-coverage [email or web address]  01/16/2021, 7:54 PM

## 2021-01-17 DIAGNOSIS — S2243XA Multiple fractures of ribs, bilateral, initial encounter for closed fracture: Secondary | ICD-10-CM | POA: Diagnosis not present

## 2021-01-17 DIAGNOSIS — I5032 Chronic diastolic (congestive) heart failure: Secondary | ICD-10-CM | POA: Diagnosis not present

## 2021-01-17 DIAGNOSIS — I480 Paroxysmal atrial fibrillation: Secondary | ICD-10-CM | POA: Diagnosis not present

## 2021-01-17 LAB — SARS CORONAVIRUS 2 (TAT 6-24 HRS): SARS Coronavirus 2: NEGATIVE

## 2021-01-17 NOTE — TOC Progression Note (Signed)
Transition of Care [facility]) - Progression Note    Patient Details  Name: Caroline Aguilar MRN: [id number] Date of Birth: [birth year]  Transition [facility]) CM/SW Contact  Su Hilt, RN Phone Number: 01/17/2021, 1:10 PM  Clinical Narrative:    Reached out to Magda Paganini at [employer] to follow up on Auth that is pending, she stated that it is still pending        Expected Discharge Plan and Services                                                 Social Determinants of Health (SDOH) Interventions    Readmission Risk Interventions No flowsheet data found.

## 2021-01-17 NOTE — TOC Progression Note (Signed)
Transition of Care [facility]) - Progression Note    Patient Details  Name: Caroline Aguilar MRN: [id number] Date of Birth: [birth year]  Transition of Care [facility]) CM/SW [place], RN Phone Number: 01/17/2021, 3:17 PM  Clinical Narrative:   Jeralene Huff out to Magda Paganini at [place] to inquire about [employer], Awaiting a call back with the status         Expected Discharge Plan and Services                                                 Social Determinants of Health (SDOH) Interventions    Readmission Risk Interventions No flowsheet data found.

## 2021-01-17 NOTE — Progress Notes (Signed)
PROGRESS NOTE    Caroline Aguilar  DJS:[id number] DOB: 04/13/1926 DOA: 01/11/2021 PCP: [name], MD    Brief Narrative:  87 year old female with a history of hypertension, hyperlipidemia, prior stroke, atrial fibrillation on anticoagulation, diastolic heart failure, admitted to the hospital with multiple falls.  She was noted to have acute right-sided rib fractures.  She also complained of back pain and recent imaging showed acute on chronic lumbar compression fracture.  She was admitted for pain management.  Orthopedics consulted to see if she would be a candidate for kyphoplasty.  Seen by PT with recommendation for skilled nursing facility placement.   Assessment & Plan:   Principal Problem:   Rib fracture Active Problems:   AF (paroxysmal atrial fibrillation) (HCC)   Stroke (HCC)   HLD (hyperlipidemia)   (HFpEF) heart failure with preserved ejection fraction ([place])   Fall   Depression   Compression fracture of spine (HCC)   HTN (hypertension)   Right-sided rib fractures status post fall -Continue supportive management with pain management -Incentive spirometry -Physical therapy  Back pain with lumbar compression fractures status post fall -History of the same where she has undergone kyphoplasty and multiple lumbar vertebra -Recent MRI from 9/22 shows acute lumbar compression fracture of L1 vertebra, although it is also commented that she has had kyphoplasty at L1 in the past -Reviewed by Dr. Rudene Christians, and it was felt that patient's pain likely emanated from lower than L1.  Further kyphoplasty or operative management at this time was not recommended.  MRI also did not reveal any evidence of sacral insufficiency fracture -It was felt that she has severe L4-L5 lumbar stenosis which could be contributing to her discomfort.  Recommendations were to follow-up in pain clinic for epidural steroid injection. -Recommendations discussed with patient and she said that she has received a  steroid injection in the past and "began hallucinating".  She has done well with low-dose prednisone in the past.  Currently on prednisone course.  Paroxysmal atrial fibrillation -Heart rate is currently stable -She is anticoagulated with apixaban  Hypertension -Continue on amlodipine  Chronic diastolic congestive heart failure -Appears to be compensated -Continue current management  Recurrent falls -Seen by physical therapy with recommendation for skilled nursing facility placement -TOC assistance requested -She will hopefully discharge on 10/4   DVT prophylaxis: apixaban (ELIQUIS) tablet 2.5 mg Start: 01/12/21 0200 apixaban (ELIQUIS) tablet 2.5 mg  Code Status: Full code Family Communication: Discussed with grand daughter at the bedside Disposition Plan: Status is: Observation  The patient remains OBS appropriate and will d/c before 2 midnights.  Dispo: The patient is from: Home              Anticipated d/c is to: SNF              Patient currently is medically stable to d/c.   Difficult to place patient No         Consultants:  Orthopedics  Procedures:    Antimicrobials:      Subjective: Feels that overall pain is better today.  She is in good spirits.  Objective: Vitals:   01/17/21 0621 01/17/21 1000 01/17/21 1551 01/17/21 1945  BP: 139/65 113/75 (!) 145/80 (!) 143/67  Pulse: 63 67 69 75  Resp: 18 17 16 16   Temp: 97.8 F (36.6 C) 98.7 F (37.1 C) 98.2 F (36.8 C) 98.1 F (36.7 C)  TempSrc: Oral Oral  Oral  SpO2: 94% 97% 94% 99%  Weight:  Height:        Intake/Output Summary (Last 24 hours) at 01/17/2021 2135 Last data filed at 01/17/2021 1906 Gross per 24 hour  Intake 840 ml  Output 700 ml  Net 140 ml   Filed Weights   01/15/21 0500 01/16/21 0500 01/17/21 0500  Weight: 76.2 kg 75.5 kg 74.9 kg    Examination:  General exam: Appears calm and comfortable  Respiratory system: Clear to auscultation. Respiratory effort  normal. Cardiovascular system: S1 & S2 heard, RRR. No JVD, murmurs, rubs, gallops or clicks. No pedal edema. Gastrointestinal system: Abdomen is nondistended, soft and nontender. No organomegaly or masses felt. Normal bowel sounds heard. Central nervous system: Alert and oriented. No focal neurological deficits. Extremities: Symmetric 5 x 5 power. Skin: No rashes, lesions or ulcers Psychiatry: Judgement and insight appear normal. Mood & affect appropriate.     Data Reviewed: I have personally reviewed following labs and imaging studies  CBC: Recent Labs  Lab 01/11/21 1317 01/15/21 0436  WBC 7.6 7.3  NEUTROABS 4.8  --   HGB 13.3 13.2  HCT 41.0 38.5  MCV 93.2 91.2  PLT 147* 542   Basic Metabolic Panel: Recent Labs  Lab 01/11/21 1317 01/15/21 0436  NA 136 132*  K 4.1 4.1  CL 97* 95*  CO2 30 29  GLUCOSE 114* 96  BUN 19 20  CREATININE 0.85 0.67  CALCIUM 8.8* 8.8*   GFR: Estimated Creatinine Clearance: 39.7 mL/min (by C-G formula based on SCr of 0.67 mg/dL). Liver Function Tests: No results for input(s): AST, ALT, ALKPHOS, BILITOT, PROT, ALBUMIN in the last 168 hours. No results for input(s): LIPASE, AMYLASE in the last 168 hours. No results for input(s): AMMONIA in the last 168 hours. Coagulation Profile: No results for input(s): INR, PROTIME in the last 168 hours. Cardiac Enzymes: No results for input(s): CKTOTAL, CKMB, CKMBINDEX, TROPONINI in the last 168 hours. BNP (last 3 results) No results for input(s): PROBNP in the last 8760 hours. HbA1C: No results for input(s): HGBA1C in the last 72 hours. CBG: No results for input(s): GLUCAP in the last 168 hours. Lipid Profile: No results for input(s): CHOL, HDL, LDLCALC, TRIG, CHOLHDL, LDLDIRECT in the last 72 hours. Thyroid Function Tests: No results for input(s): TSH, T4TOTAL, FREET4, T3FREE, THYROIDAB in the last 72 hours. Anemia Panel: No results for input(s): VITAMINB12, FOLATE, FERRITIN, TIBC, IRON, RETICCTPCT  in the last 72 hours. Sepsis Labs: No results for input(s): PROCALCITON, LATICACIDVEN in the last 168 hours.  Recent Results (from the past 240 hour(s))  Resp Panel by RT-PCR (Flu A&B, Covid) Nasopharyngeal Swab     Status: None   Collection Time: 01/11/21  3:42 PM   Specimen: Nasopharyngeal Swab; Nasopharyngeal(NP) swabs in vial transport medium  Result Value Ref Range Status   SARS Coronavirus 2 by RT PCR NEGATIVE NEGATIVE Final    Comment: (NOTE) SARS-CoV-2 target nucleic acids are NOT DETECTED.  The SARS-CoV-2 RNA is generally detectable in upper respiratory specimens during the acute phase of infection. The lowest concentration of SARS-CoV-2 viral copies this assay can detect is 138 copies/mL. A negative result does not preclude SARS-Cov-2 infection and should not be used as the sole basis for treatment or other patient management decisions. A negative result may occur with  improper specimen collection/handling, submission of specimen other than nasopharyngeal swab, presence of viral mutation(s) within the areas targeted by this assay, and inadequate number of viral copies(<138 copies/mL). A negative result must be combined with clinical observations, patient history, and  epidemiological information. The expected result is Negative.  Fact Sheet for Patients:  EntrepreneurPulse.com.au  Fact Sheet for Healthcare Providers:  IncredibleEmployment.be  This test is no t yet approved or cleared by the [place] FDA and  has been authorized for detection and/or diagnosis of SARS-CoV-2 by FDA under an Emergency Use Authorization (EUA). This EUA will remain  in effect (meaning this test can be used) for the duration of the COVID-19 declaration under Section 564(b)(1) of the Act, 21 U.S.C.section 360bbb-3(b)(1), unless the authorization is terminated  or revoked sooner.       Influenza A by PCR NEGATIVE NEGATIVE Final   Influenza B by PCR  NEGATIVE NEGATIVE Final    Comment: (NOTE) The Xpert Xpress SARS-CoV-2/FLU/RSV plus assay is intended as an aid in the diagnosis of influenza from Nasopharyngeal swab specimens and should not be used as a sole basis for treatment. Nasal washings and aspirates are unacceptable for Xpert Xpress SARS-CoV-2/FLU/RSV testing.  Fact Sheet for Patients: EntrepreneurPulse.com.au  Fact Sheet for Healthcare Providers: IncredibleEmployment.be  This test is not yet approved or cleared by the [place] FDA and has been authorized for detection and/or diagnosis of SARS-CoV-2 by FDA under an Emergency Use Authorization (EUA). This EUA will remain in effect (meaning this test can be used) for the duration of the COVID-19 declaration under Section 564(b)(1) of the Act, 21 U.S.C. section 360bbb-3(b)(1), unless the authorization is terminated or revoked.  Performed at [facility], [address], [place], [place] [address]          Radiology Studies: No results found.      Scheduled Meds:  amLODipine  2.5 mg Oral BID   apixaban  2.5 mg Oral BID   buPROPion  150 mg Oral BH-q7a   docusate sodium  100 mg Oral Daily   gabapentin  100 mg Oral QHS   latanoprost  1 drop Both Eyes QHS   lidocaine  1 patch Transdermal Q24H   pantoprazole  40 mg Oral Daily   predniSONE  20 mg Oral Q breakfast   sertraline  50 mg Oral BH-q7a   simvastatin  20 mg Oral q1800   timolol  1 drop Both Eyes BID   Continuous Infusions:   LOS: 0 days    Time spent: 65mins    Kathie Dike, MD [facility]   If 7PM-7AM, please contact night-coverage [email or web address]  01/17/2021, 9:35 PM

## 2021-01-17 NOTE — Progress Notes (Signed)
Patient was disimpacted by her daughter. The daughter  stated her mom was constipated, so she got it out. RN was notified by this Probation officer

## 2021-01-18 DIAGNOSIS — M8000XD Age-related osteoporosis with current pathological fracture, unspecified site, subsequent encounter for fracture with routine healing: Secondary | ICD-10-CM | POA: Diagnosis not present

## 2021-01-18 DIAGNOSIS — Z20822 Contact with and (suspected) exposure to covid-19: Secondary | ICD-10-CM | POA: Diagnosis not present

## 2021-01-18 DIAGNOSIS — M4850XD Collapsed vertebra, not elsewhere classified, site unspecified, subsequent encounter for fracture with routine healing: Secondary | ICD-10-CM | POA: Diagnosis not present

## 2021-01-18 DIAGNOSIS — Z8673 Personal history of transient ischemic attack (TIA), and cerebral infarction without residual deficits: Secondary | ICD-10-CM | POA: Diagnosis not present

## 2021-01-18 DIAGNOSIS — S2241XD Multiple fractures of ribs, right side, subsequent encounter for fracture with routine healing: Secondary | ICD-10-CM | POA: Diagnosis not present

## 2021-01-18 DIAGNOSIS — M4807 Spinal stenosis, lumbosacral region: Secondary | ICD-10-CM | POA: Diagnosis not present

## 2021-01-18 DIAGNOSIS — W19XXXD Unspecified fall, subsequent encounter: Secondary | ICD-10-CM | POA: Diagnosis not present

## 2021-01-18 DIAGNOSIS — I480 Paroxysmal atrial fibrillation: Secondary | ICD-10-CM | POA: Diagnosis not present

## 2021-01-18 DIAGNOSIS — M48061 Spinal stenosis, lumbar region without neurogenic claudication: Secondary | ICD-10-CM | POA: Diagnosis not present

## 2021-01-18 DIAGNOSIS — I10 Essential (primary) hypertension: Secondary | ICD-10-CM | POA: Diagnosis not present

## 2021-01-18 DIAGNOSIS — R4182 Altered mental status, unspecified: Secondary | ICD-10-CM | POA: Diagnosis not present

## 2021-01-18 DIAGNOSIS — I2510 Atherosclerotic heart disease of native coronary artery without angina pectoris: Secondary | ICD-10-CM | POA: Diagnosis not present

## 2021-01-18 DIAGNOSIS — Z7901 Long term (current) use of anticoagulants: Secondary | ICD-10-CM | POA: Diagnosis not present

## 2021-01-18 DIAGNOSIS — S0990XA Unspecified injury of head, initial encounter: Secondary | ICD-10-CM | POA: Diagnosis not present

## 2021-01-18 DIAGNOSIS — E119 Type 2 diabetes mellitus without complications: Secondary | ICD-10-CM | POA: Diagnosis not present

## 2021-01-18 DIAGNOSIS — S2243XA Multiple fractures of ribs, bilateral, initial encounter for closed fracture: Secondary | ICD-10-CM | POA: Diagnosis not present

## 2021-01-18 DIAGNOSIS — S93492A Sprain of other ligament of left ankle, initial encounter: Secondary | ICD-10-CM | POA: Diagnosis not present

## 2021-01-18 DIAGNOSIS — I110 Hypertensive heart disease with heart failure: Secondary | ICD-10-CM | POA: Diagnosis not present

## 2021-01-18 DIAGNOSIS — R279 Unspecified lack of coordination: Secondary | ICD-10-CM | POA: Diagnosis not present

## 2021-01-18 DIAGNOSIS — S2231XA Fracture of one rib, right side, initial encounter for closed fracture: Secondary | ICD-10-CM | POA: Diagnosis not present

## 2021-01-18 DIAGNOSIS — R0602 Shortness of breath: Secondary | ICD-10-CM | POA: Diagnosis not present

## 2021-01-18 DIAGNOSIS — I5032 Chronic diastolic (congestive) heart failure: Secondary | ICD-10-CM | POA: Diagnosis not present

## 2021-01-18 DIAGNOSIS — Z743 Need for continuous supervision: Secondary | ICD-10-CM | POA: Diagnosis not present

## 2021-01-18 DIAGNOSIS — Z85038 Personal history of other malignant neoplasm of large intestine: Secondary | ICD-10-CM | POA: Diagnosis not present

## 2021-01-18 MED ORDER — PREDNISONE 20 MG PO TABS: 20.0000 mg | ORAL_TABLET | Freq: Every day | ORAL | Status: DC

## 2021-01-18 NOTE — Discharge Summary (Addendum)
Physician Discharge Summary  Caroline Aguilar QMG:[id number] DOB: 09/14/1925 DOA: 01/11/2021  PCP: [name], MD  Admit date: 01/11/2021 Discharge date: 01/18/2021  Admitted From: Home Disposition: Skilled nursing facility  Recommendations for Outpatient Follow-up:  Follow up with PCP in 1-2 weeks Please obtain BMP/CBC in one week Patient would benefit from referral to pain clinic for spinal  injections  Discharge Condition: Stable CODE STATUS: Full code Diet recommendation: Heart healthy  Brief/Interim Summary: 87 year old female with a history of hypertension, hyperlipidemia, prior stroke, atrial fibrillation on anticoagulation, diastolic heart failure, admitted to the hospital with multiple falls.  She was noted to have acute right-sided rib fractures.  She also complained of back pain and recent imaging showed acute on chronic lumbar compression fracture.  She was admitted for pain management.  Orthopedics consulted to see if she would be a candidate for kyphoplasty.  Seen by PT with recommendation for skilled nursing facility placement.  Discharge Diagnoses:  Principal Problem:   Rib fracture Active Problems:   AF (paroxysmal atrial fibrillation) (HCC)   Stroke (HCC)   HLD (hyperlipidemia)   (HFpEF) heart failure with preserved ejection fraction ([facility])   Fall   Depression   Compression fracture of spine (HCC)   HTN (hypertension)  Right-sided rib fractures status post fall -Continue supportive management with pain management -Incentive spirometry -Physical therapy   Back pain with lumbar compression fractures status post fall -History of the same where she has undergone kyphoplasty and multiple lumbar vertebra -Recent MRI from 9/22 shows acute lumbar compression fracture of L1 vertebra, although it is also commented that she has had kyphoplasty at L1 in the past -Reviewed by Dr. Rudene Christians, and it was felt that patient's pain likely emanated from lower than L1.  Further  kyphoplasty or operative management at this time was not recommended.  MRI also did not reveal any evidence of sacral insufficiency fracture -It was felt that she has severe L4-L5 lumbar stenosis which could be contributing to her discomfort.  Recommendations were to follow-up in pain clinic for epidural steroid injection. -Recommendations discussed with patient and she said that she has received a steroid injection in the past and "began hallucinating".  She has done well with low-dose prednisone in the past and is willing to try this at this time. -She has been started on a course of prednisone 20 mg daily until 10/8 and appears to be tolerating it   Paroxysmal atrial fibrillation -Heart rate is currently stable -She is anticoagulated with apixaban   Hypertension -Continue on amlodipine   Chronic diastolic congestive heart failure -Appears to be compensated -Continue current management   Recurrent falls -Seen by physical therapy with recommendation for skilled nursing facility placement  Discharge Instructions   Allergies as of 01/18/2021       Reactions   Penicillins Anaphylaxis, Other (See Comments)   Has patient had a PCN reaction causing immediate rash, facial/tongue/throat swelling, SOB or lightheadedness with hypotension: Yes Has patient had a PCN reaction causing severe rash involving mucus membranes or skin necrosis: No Has patient had a PCN reaction that required hospitalization No Has patient had a PCN reaction occurring within the last 10 years: No If all of the above answers are "NO", then may proceed with Cephalosporin use.   Penicillins Anaphylaxis   Azithromycin Other (See Comments)   Reaction:  Unknown    Cortisone    Other reaction(s): Hallucination   Famciclovir Other (See Comments)   Reaction:  Unknown    Meloxicam  Morphine    Streptomycin    Tramadol    Other reaction(s): Hallucination   Venlafaxine    Other reaction(s): Dizziness         Medication List     STOP taking these medications    amiodarone 200 MG tablet Commonly known as: PACERONE   oxyCODONE-acetaminophen 5-325 MG tablet Commonly known as: PERCOCET/ROXICET       TAKE these medications    acetaminophen 325 MG tablet Commonly known as: TYLENOL Take 650 mg by mouth every 6 (six) hours as needed for moderate pain.   amLODipine 5 MG tablet Commonly known as: NORVASC Take 2.5 mg by mouth 2 (two) times daily.   apixaban 2.5 MG Tabs tablet Commonly known as: ELIQUIS Take 2.5 mg by mouth 2 (two) times daily.   buPROPion 150 MG 24 hr tablet Commonly known as: WELLBUTRIN XL Take 150 mg by mouth every morning.   Calcium Carbonate-Vitamin D 600-400 MG-UNIT tablet Take 1 tablet by mouth daily.   docusate sodium 100 MG capsule Commonly known as: COLACE Take 100 mg by mouth daily.   gabapentin 100 MG capsule Commonly known as: NEURONTIN Take 100 mg by mouth at bedtime.   latanoprost 0.005 % ophthalmic solution Commonly known as: XALATAN Place 1 drop into both eyes at bedtime.   lidocaine 5 % Commonly known as: LIDODERM Place 1 patch onto the skin daily. Remove & Discard patch within 12 hours or as directed by MD   omeprazole 20 MG capsule Commonly known as: PRILOSEC Take 20 mg by mouth at bedtime.   oxyCODONE 5 MG immediate release tablet Commonly known as: Oxy IR/ROXICODONE Take 0.5 tablets (2.5 mg total) by mouth every 3 (three) hours as needed for moderate pain.   predniSONE 20 MG tablet Commonly known as: DELTASONE Take 1 tablet (20 mg total) by mouth daily with breakfast. Until 10/8 Start taking on: January 19, 2021   sertraline 50 MG tablet Commonly known as: ZOLOFT Take 50 mg by mouth every morning.   simvastatin 20 MG tablet Commonly known as: ZOCOR Take 20 mg by mouth daily at 6 PM.   timolol 0.5 % ophthalmic solution Commonly known as: TIMOPTIC Place 1 drop into both eyes 2 (two) times daily.         Allergies   Allergen Reactions   Penicillins Anaphylaxis and Other (See Comments)    Has patient had a PCN reaction causing immediate rash, facial/tongue/throat swelling, SOB or lightheadedness with hypotension: Yes Has patient had a PCN reaction causing severe rash involving mucus membranes or skin necrosis: No Has patient had a PCN reaction that required hospitalization No Has patient had a PCN reaction occurring within the last 10 years: No If all of the above answers are "NO", then may proceed with Cephalosporin use.   Penicillins Anaphylaxis   Azithromycin Other (See Comments)    Reaction:  Unknown    Cortisone     Other reaction(s): Hallucination   Famciclovir Other (See Comments)    Reaction:  Unknown    Meloxicam    Morphine    Streptomycin    Tramadol     Other reaction(s): Hallucination   Venlafaxine     Other reaction(s): Dizziness    Consultations:    Procedures/Studies: DG Ribs Unilateral W/Chest Right  Result Date: 01/11/2021 CLINICAL DATA:  [place]. Right rib pain. EXAM: RIGHT RIBS AND CHEST - 3+ VIEW COMPARISON:  Chest CT 06/30/2020 FINDINGS: The cardiac silhouette, mediastinal hilar contours are within normal limits given the  patient's age and supine position. Stable tortuosity and calcification of the thoracic aorta. Stable surgical changes from TAVR. No acute pulmonary findings. No pleural effusions or pneumothorax. Dedicated views of the right ribs demonstrate suspected acute and chronic rib fractures. No pleural thickening, pleural effusion or pneumothorax. IMPRESSION: 1. Suspect acute and chronic right rib fractures and chronic left rib fractures. 2. No acute cardiopulmonary findings. Electronically Signed   By: Marijo Sanes M.D.   On: 01/11/2021 14:33   DG Thoracic Spine 2 View  Result Date: 01/11/2021 CLINICAL DATA:  Fall. EXAM: THORACIC SPINE 2 VIEWS COMPARISON:  November 04, 2019.  January 06, 2021. FINDINGS: Mild dextroscoliosis of thoracic spine is noted. No  spondylolisthesis is noted. No acute fracture is noted. Anterior osteophyte formation is noted at multiple levels of the lower thoracic spine. IMPRESSION: Degenerative changes as described above. No acute abnormality seen in the thoracic spine. Electronically Signed   By: Marijo Conception M.D.   On: 01/11/2021 14:29   DG Lumbar Spine Complete  Result Date: 01/11/2021 CLINICAL DATA:  Fall. EXAM: LUMBAR SPINE - COMPLETE 4+ VIEW COMPARISON:  January 06, 2021. FINDINGS: Status post kyphoplasty of L1, L2 and L4 levels. Old severe compression fractures are noted at L1 and L2. No acute fracture or spondylolisthesis is noted. Degenerative changes are noted at multiple levels. IMPRESSION: No acute abnormality is noted. Electronically Signed   By: Marijo Conception M.D.   On: 01/11/2021 14:30   DG Ankle 2 Views Left  Result Date: 01/11/2021 CLINICAL DATA:  Left ankle pain after fall. EXAM: LEFT ANKLE - 2 VIEW COMPARISON:  None. FINDINGS: There is no evidence of fracture, dislocation, or joint effusion. There is no evidence of arthropathy or other focal bone abnormality. Soft tissues are unremarkable. IMPRESSION: Negative. Electronically Signed   By: Marijo Conception M.D.   On: 01/11/2021 14:25   CT HEAD WO CONTRAST (5MM)  Result Date: 01/11/2021 CLINICAL DATA:  87 year old female with history of trauma to the head and neck after falling in the bathtub. EXAM: CT HEAD WITHOUT CONTRAST CT CERVICAL SPINE WITHOUT CONTRAST TECHNIQUE: Multidetector CT imaging of the head and cervical spine was performed following the standard protocol without intravenous contrast. Multiplanar CT image reconstructions of the cervical spine were also generated. COMPARISON:  Head and cervical spine CT 12/30/2020. FINDINGS: CT HEAD FINDINGS Brain: Mild cerebral atrophy. Patchy and confluent areas of decreased attenuation are noted throughout the deep and periventricular white matter of the cerebral hemispheres bilaterally, compatible with  chronic microvascular ischemic disease. Physiologic calcifications in the basal ganglia bilaterally. No evidence of acute infarction, hemorrhage, hydrocephalus, extra-axial collection or mass lesion/mass effect. Vascular: No hyperdense vessel or unexpected calcification. Skull: Normal. Negative for fracture or focal lesion. Sinuses/Orbits: No acute finding. Other: None. CT CERVICAL SPINE FINDINGS Alignment: Reversal of normal cervical lordosis centered at the level of C3, likely chronic. Alignment is otherwise anatomic. Skull base and vertebrae: No acute fracture. No primary bone lesion or focal pathologic process. Soft tissues and spinal canal: No prevertebral fluid or swelling. No visible canal hematoma. Disc levels: Multilevel degenerative disc disease, most evident at C5-C6 and C6-C7. Moderate multilevel facet arthropathy bilaterally. Upper chest: Scarring in the lung apices. Other: None. IMPRESSION: 1. No evidence of significant acute traumatic injury to the skull, brain or cervical spine. 2. Mild cerebral atrophy with extensive chronic microvascular ischemic changes in the cerebral white matter, as above. 3. Multilevel degenerative disc disease and cervical spondylosis, as above. Electronically Signed  By: Vinnie Langton M.D.   On: 01/11/2021 14:50   CT HEAD WO CONTRAST (5MM)  Result Date: 12/30/2020 CLINICAL DATA:  Fall.  Neck pain and headache. EXAM: CT HEAD WITHOUT CONTRAST CT CERVICAL SPINE WITHOUT CONTRAST TECHNIQUE: Multidetector CT imaging of the head and cervical spine was performed following the standard protocol without intravenous contrast. Multiplanar CT image reconstructions of the cervical spine were also generated. COMPARISON:  Head CT 08/10/2020 FINDINGS: CT HEAD FINDINGS Brain: No acute intracranial hemorrhage. No focal mass lesion. No CT evidence of acute infarction. No midline shift or mass effect. No hydrocephalus. Basilar cisterns are patent. There are periventricular and  subcortical white matter hypodensities. Generalized cortical atrophy. Vascular: No hyperdense vessel or unexpected calcification. Skull: Normal. Negative for fracture or focal lesion. Sinuses/Orbits: Paranasal sinuses and mastoid air cells are clear. Orbits are clear. Other: None. CT CERVICAL SPINE FINDINGS Alignment: Normal alignment of the cervical vertebral bodies. Skull base and vertebrae: Normal craniocervical junction. No loss of vertebral body height or disc height. Normal facet articulation. No evidence of fracture. Soft tissues and spinal canal: No prevertebral soft tissue swelling. No perispinal or epidural hematoma. Disc levels: There is disc space narrowing and endplate sclerosis from C5-C7. Acute findings. Mild endplate spurring is levels Upper chest: Clear Other: None IMPRESSION: 1. No acute intracranial trauma. 2. Chronic atrophy and white matter microvascular disease. 3. No cervical spine fracture. 4. Multilevel disc osteophytic disease. Electronically Signed   By: Suzy Bouchard M.D.   On: 12/30/2020 12:58   CT Cervical Spine Wo Contrast  Result Date: 01/11/2021 CLINICAL DATA:  87 year old female with history of trauma to the head and neck after falling in the bathtub. EXAM: CT HEAD WITHOUT CONTRAST CT CERVICAL SPINE WITHOUT CONTRAST TECHNIQUE: Multidetector CT imaging of the head and cervical spine was performed following the standard protocol without intravenous contrast. Multiplanar CT image reconstructions of the cervical spine were also generated. COMPARISON:  Head and cervical spine CT 12/30/2020. FINDINGS: CT HEAD FINDINGS Brain: Mild cerebral atrophy. Patchy and confluent areas of decreased attenuation are noted throughout the deep and periventricular white matter of the cerebral hemispheres bilaterally, compatible with chronic microvascular ischemic disease. Physiologic calcifications in the basal ganglia bilaterally. No evidence of acute infarction, hemorrhage, hydrocephalus,  extra-axial collection or mass lesion/mass effect. Vascular: No hyperdense vessel or unexpected calcification. Skull: Normal. Negative for fracture or focal lesion. Sinuses/Orbits: No acute finding. Other: None. CT CERVICAL SPINE FINDINGS Alignment: Reversal of normal cervical lordosis centered at the level of C3, likely chronic. Alignment is otherwise anatomic. Skull base and vertebrae: No acute fracture. No primary bone lesion or focal pathologic process. Soft tissues and spinal canal: No prevertebral fluid or swelling. No visible canal hematoma. Disc levels: Multilevel degenerative disc disease, most evident at C5-C6 and C6-C7. Moderate multilevel facet arthropathy bilaterally. Upper chest: Scarring in the lung apices. Other: None. IMPRESSION: 1. No evidence of significant acute traumatic injury to the skull, brain or cervical spine. 2. Mild cerebral atrophy with extensive chronic microvascular ischemic changes in the cerebral white matter, as above. 3. Multilevel degenerative disc disease and cervical spondylosis, as above. Electronically Signed   By: Vinnie Langton M.D.   On: 01/11/2021 14:50   CT CERVICAL SPINE WO CONTRAST  Result Date: 12/30/2020 CLINICAL DATA:  Fall.  Neck pain and headache. EXAM: CT HEAD WITHOUT CONTRAST CT CERVICAL SPINE WITHOUT CONTRAST TECHNIQUE: Multidetector CT imaging of the head and cervical spine was performed following the standard protocol without intravenous contrast. Multiplanar CT image  reconstructions of the cervical spine were also generated. COMPARISON:  Head CT 08/10/2020 FINDINGS: CT HEAD FINDINGS Brain: No acute intracranial hemorrhage. No focal mass lesion. No CT evidence of acute infarction. No midline shift or mass effect. No hydrocephalus. Basilar cisterns are patent. There are periventricular and subcortical white matter hypodensities. Generalized cortical atrophy. Vascular: No hyperdense vessel or unexpected calcification. Skull: Normal. Negative for fracture  or focal lesion. Sinuses/Orbits: Paranasal sinuses and mastoid air cells are clear. Orbits are clear. Other: None. CT CERVICAL SPINE FINDINGS Alignment: Normal alignment of the cervical vertebral bodies. Skull base and vertebrae: Normal craniocervical junction. No loss of vertebral body height or disc height. Normal facet articulation. No evidence of fracture. Soft tissues and spinal canal: No prevertebral soft tissue swelling. No perispinal or epidural hematoma. Disc levels: There is disc space narrowing and endplate sclerosis from C5-C7. Acute findings. Mild endplate spurring is levels Upper chest: Clear Other: None IMPRESSION: 1. No acute intracranial trauma. 2. Chronic atrophy and white matter microvascular disease. 3. No cervical spine fracture. 4. Multilevel disc osteophytic disease. Electronically Signed   By: Suzy Bouchard M.D.   On: 12/30/2020 12:58   MR LUMBAR SPINE WO CONTRAST  Result Date: 01/06/2021 CLINICAL DATA:  Nontraumatic compression fracture of L3 vertebra, initial encounter (HCC) M48.56XA (ICD-10-CM) Increasing back pain. EXAM: MRI LUMBAR SPINE WITHOUT CONTRAST TECHNIQUE: Multiplanar, multisequence MR imaging of the lumbar spine was performed. No intravenous contrast was administered. COMPARISON:  MRI lumbar spine 11/04/2019. FINDINGS: Segmentation: Standard segmentation, consistent with prior numbering. Alignment:  Similar slight retrolisthesis of L5 on S1. Vertebrae: Prior kyphoplasties at L1, L2, and L4. Prior T12, L1, L2, and L4 vertebral body compression fractures. Mild progression of height loss at L1, now approximately 50% (previously 40%). Additionally, there is mild marrow edema along the the L1 superior endplate in the region of height loss, suspicious for acute/recent fracture. Otherwise, vertebral body heights are similar. Conus medullaris and cauda equina: Conus extends to the L1 level. Conus appears normal. Paraspinal and other soft tissues: Bilateral renal cyst. Paraspinal  muscular atrophy. Disc levels: T12-L1: Small central disc protrusion with caudal migration, similar. Mild bilateral facet arthropathy. No evidence of significant canal or foraminal stenosis. L1-L2: Mild disc bulging and mild bilateral facet arthropathy. Similar mild bilateral foraminal stenosis. No significant canal stenosis. L2-L3: Disc bulging and moderate bilateral facet arthropathy. Similar bilateral subarticular recess stenosis and mild bilateral foraminal stenosis. No significant central canal stenosis. L3-L4: Broad disc bulge with endplate spurring. Similar moderate canal stenosis with left greater than right subarticular recess stenosis. Similar mild-to-moderate left greater than right foraminal stenosis. L4-L5: Broad disc bulge with endplate spurring. Moderate bilateral facet hypertrophy and bulky ligamentum flavum thickening. Resulting severe canal stenosis and right greater than left subarticular recess stenosis, progressed from prior. Similar mild-to-moderate bilateral foraminal stenosis. L5-S1: Broad disc bulge and moderate bilateral facet arthropathy with ligamentum flavum thickening. Resulting mild-to-moderate central canal stenosis and moderate bilateral subarticular recess stenosis, similar. Mild bilateral foraminal stenosis. IMPRESSION: 1. Interval mild progression of height loss at L1 (now 50%) with mild marrow edema involving the superior endplate, compatible with acute/recent on chronic compression fracture. 2. Severe canal stenosis at L4-L5, which has progressed since 2021. 3. Otherwise, similar multilevel degenerative change that is detailed above. 4. Additional chronic T12, L2, and L4 compression fractures with similar height loss. Electronically Signed   By: Margaretha Sheffield M.D.   On: 01/06/2021 13:28      Subjective: No new complaints  Discharge Exam: Vitals:  01/17/21 1945 01/18/21 0500 01/18/21 0515 01/18/21 0841  BP: (!) 143/67  129/65 (!) 146/66  Pulse: 75  62 65  Resp:  16  16 15   Temp: 98.1 F (36.7 C)  98 F (36.7 C) (!) 97.4 F (36.3 C)  TempSrc: Oral  Oral   SpO2: 99%  95% 96%  Weight:  75.4 kg    Height:        General: Pt is alert, awake, not in acute distress Cardiovascular: RRR, S1/S2 +, no rubs, no gallops Respiratory: CTA bilaterally, no wheezing, no rhonchi Abdominal: Soft, NT, ND, bowel sounds + Extremities: no edema, no cyanosis    The results of significant diagnostics from this hospitalization (including imaging, microbiology, ancillary and laboratory) are listed below for reference.     Microbiology: Recent Results (from the past 240 hour(s))  Resp Panel by RT-PCR (Flu A&B, Covid) Nasopharyngeal Swab     Status: None   Collection Time: 01/11/21  3:42 PM   Specimen: Nasopharyngeal Swab; Nasopharyngeal(NP) swabs in vial transport medium  Result Value Ref Range Status   SARS Coronavirus 2 by RT PCR NEGATIVE NEGATIVE Final    Comment: (NOTE) SARS-CoV-2 target nucleic acids are NOT DETECTED.  The SARS-CoV-2 RNA is generally detectable in upper respiratory specimens during the acute phase of infection. The lowest concentration of SARS-CoV-2 viral copies this assay can detect is 138 copies/mL. A negative result does not preclude SARS-Cov-2 infection and should not be used as the sole basis for treatment or other patient management decisions. A negative result may occur with  improper specimen collection/handling, submission of specimen other than nasopharyngeal swab, presence of viral mutation(s) within the areas targeted by this assay, and inadequate number of viral copies(<138 copies/mL). A negative result must be combined with clinical observations, patient history, and epidemiological information. The expected result is Negative.  Fact Sheet for Patients:  EntrepreneurPulse.com.au  Fact Sheet for Healthcare Providers:  IncredibleEmployment.be  This test is no t yet approved or cleared  by the [place] FDA and  has been authorized for detection and/or diagnosis of SARS-CoV-2 by FDA under an Emergency Use Authorization (EUA). This EUA will remain  in effect (meaning this test can be used) for the duration of the COVID-19 declaration under Section 564(b)(1) of the Act, 21 U.S.C.section 360bbb-3(b)(1), unless the authorization is terminated  or revoked sooner.       Influenza A by PCR NEGATIVE NEGATIVE Final   Influenza B by PCR NEGATIVE NEGATIVE Final    Comment: (NOTE) The Xpert Xpress SARS-CoV-2/FLU/RSV plus assay is intended as an aid in the diagnosis of influenza from Nasopharyngeal swab specimens and should not be used as a sole basis for treatment. Nasal washings and aspirates are unacceptable for Xpert Xpress SARS-CoV-2/FLU/RSV testing.  Fact Sheet for Patients: EntrepreneurPulse.com.au  Fact Sheet for Healthcare Providers: IncredibleEmployment.be  This test is not yet approved or cleared by the [place] FDA and has been authorized for detection and/or diagnosis of SARS-CoV-2 by FDA under an Emergency Use Authorization (EUA). This EUA will remain in effect (meaning this test can be used) for the duration of the COVID-19 declaration under Section 564(b)(1) of the Act, 21 U.S.C. section 360bbb-3(b)(1), unless the authorization is terminated or revoked.  Performed at [facility], [place], [place] [address]   SARS CORONAVIRUS 2 (TAT 6-24 HRS) Nasopharyngeal Nasopharyngeal Swab     Status: None   Collection Time: 01/17/21  1:04 PM   Specimen: Nasopharyngeal Swab  Result Value  Ref Range Status   SARS Coronavirus 2 NEGATIVE NEGATIVE Final    Comment: (NOTE) SARS-CoV-2 target nucleic acids are NOT DETECTED.  The SARS-CoV-2 RNA is generally detectable in upper and lower respiratory specimens during the acute phase of infection. Negative results do not preclude SARS-CoV-2 infection, do  not rule out co-infections with other pathogens, and should not be used as the sole basis for treatment or other patient management decisions. Negative results must be combined with clinical observations, patient history, and epidemiological information. The expected result is Negative.  Fact Sheet for Patients: SugarRoll.be  Fact Sheet for Healthcare Providers: https://www.woods-mathews.com/  This test is not yet approved or cleared by the [place] FDA and  has been authorized for detection and/or diagnosis of SARS-CoV-2 by FDA under an Emergency Use Authorization (EUA). This EUA will remain  in effect (meaning this test can be used) for the duration of the COVID-19 declaration under Se ction 564(b)(1) of the Act, 21 U.S.C. section 360bbb-3(b)(1), unless the authorization is terminated or revoked sooner.  Performed at [facility], [place] [address] [place], [place] [address]      Labs: BNP (last 3 results) Recent Labs    01/11/21 1317  BNP 347.4*   Basic Metabolic Panel: Recent Labs  Lab 01/11/21 1317 01/15/21 0436  NA 136 132*  K 4.1 4.1  CL 97* 95*  CO2 30 29  GLUCOSE 114* 96  BUN 19 20  CREATININE 0.85 0.67  CALCIUM 8.8* 8.8*   Liver Function Tests: No results for input(s): AST, ALT, ALKPHOS, BILITOT, PROT, ALBUMIN in the last 168 hours. No results for input(s): LIPASE, AMYLASE in the last 168 hours. No results for input(s): AMMONIA in the last 168 hours. CBC: Recent Labs  Lab 01/11/21 1317 01/15/21 0436  WBC 7.6 7.3  NEUTROABS 4.8  --   HGB 13.3 13.2  HCT 41.0 38.5  MCV 93.2 91.2  PLT 147* 172   Cardiac Enzymes: No results for input(s): CKTOTAL, CKMB, CKMBINDEX, TROPONINI in the last 168 hours. BNP: Invalid input(s): POCBNP CBG: No results for input(s): GLUCAP in the last 168 hours. D-Dimer No results for input(s): DDIMER in the last 72 hours. Hgb A1c No results for input(s): HGBA1C in the  last 72 hours. Lipid Profile No results for input(s): CHOL, HDL, LDLCALC, TRIG, CHOLHDL, LDLDIRECT in the last 72 hours. Thyroid function studies No results for input(s): TSH, T4TOTAL, T3FREE, THYROIDAB in the last 72 hours.  Invalid input(s): FREET3 Anemia work up No results for input(s): VITAMINB12, FOLATE, FERRITIN, TIBC, IRON, RETICCTPCT in the last 72 hours. Urinalysis    Component Value Date/Time   COLORURINE YELLOW (A) 02/08/2020 2047   APPEARANCEUR CLEAR (A) 02/08/2020 2047   LABSPEC 1.006 02/08/2020 2047   PHURINE 7.0 02/08/2020 2047   GLUCOSEU NEGATIVE 02/08/2020 2047   HGBUR SMALL (A) 02/08/2020 2047   BILIRUBINUR NEGATIVE 02/08/2020 2047   KETONESUR NEGATIVE 02/08/2020 2047   PROTEINUR NEGATIVE 02/08/2020 2047   NITRITE POSITIVE (A) 02/08/2020 2047   LEUKOCYTESUR NEGATIVE 02/08/2020 2047   Sepsis Labs Invalid input(s): PROCALCITONIN,  WBC,  LACTICIDVEN Microbiology Recent Results (from the past 240 hour(s))  Resp Panel by RT-PCR (Flu A&B, Covid) Nasopharyngeal Swab     Status: None   Collection Time: 01/11/21  3:42 PM   Specimen: Nasopharyngeal Swab; Nasopharyngeal(NP) swabs in vial transport medium  Result Value Ref Range Status   SARS Coronavirus 2 by RT PCR NEGATIVE NEGATIVE Final    Comment: (NOTE) SARS-CoV-2 target nucleic acids  are NOT DETECTED.  The SARS-CoV-2 RNA is generally detectable in upper respiratory specimens during the acute phase of infection. The lowest concentration of SARS-CoV-2 viral copies this assay can detect is 138 copies/mL. A negative result does not preclude SARS-Cov-2 infection and should not be used as the sole basis for treatment or other patient management decisions. A negative result may occur with  improper specimen collection/handling, submission of specimen other than nasopharyngeal swab, presence of viral mutation(s) within the areas targeted by this assay, and inadequate number of viral copies(<138 copies/mL). A negative  result must be combined with clinical observations, patient history, and epidemiological information. The expected result is Negative.  Fact Sheet for Patients:  EntrepreneurPulse.com.au  Fact Sheet for Healthcare Providers:  IncredibleEmployment.be  This test is no t yet approved or cleared by the [place] FDA and  has been authorized for detection and/or diagnosis of SARS-CoV-2 by FDA under an Emergency Use Authorization (EUA). This EUA will remain  in effect (meaning this test can be used) for the duration of the COVID-19 declaration under Section 564(b)(1) of the Act, 21 U.S.C.section 360bbb-3(b)(1), unless the authorization is terminated  or revoked sooner.       Influenza A by PCR NEGATIVE NEGATIVE Final   Influenza B by PCR NEGATIVE NEGATIVE Final    Comment: (NOTE) The Xpert Xpress SARS-CoV-2/FLU/RSV plus assay is intended as an aid in the diagnosis of influenza from Nasopharyngeal swab specimens and should not be used as a sole basis for treatment. Nasal washings and aspirates are unacceptable for Xpert Xpress SARS-CoV-2/FLU/RSV testing.  Fact Sheet for Patients: EntrepreneurPulse.com.au  Fact Sheet for Healthcare Providers: IncredibleEmployment.be  This test is not yet approved or cleared by the [place] FDA and has been authorized for detection and/or diagnosis of SARS-CoV-2 by FDA under an Emergency Use Authorization (EUA). This EUA will remain in effect (meaning this test can be used) for the duration of the COVID-19 declaration under Section 564(b)(1) of the Act, 21 U.S.C. section 360bbb-3(b)(1), unless the authorization is terminated or revoked.  Performed at [facility], [place], [place] [address]   SARS CORONAVIRUS 2 (TAT 6-24 HRS) Nasopharyngeal Nasopharyngeal Swab     Status: None   Collection Time: 01/17/21  1:04 PM   Specimen: Nasopharyngeal  Swab  Result Value Ref Range Status   SARS Coronavirus 2 NEGATIVE NEGATIVE Final    Comment: (NOTE) SARS-CoV-2 target nucleic acids are NOT DETECTED.  The SARS-CoV-2 RNA is generally detectable in upper and lower respiratory specimens during the acute phase of infection. Negative results do not preclude SARS-CoV-2 infection, do not rule out co-infections with other pathogens, and should not be used as the sole basis for treatment or other patient management decisions. Negative results must be combined with clinical observations, patient history, and epidemiological information. The expected result is Negative.  Fact Sheet for Patients: SugarRoll.be  Fact Sheet for Healthcare Providers: https://www.woods-mathews.com/  This test is not yet approved or cleared by the [place] FDA and  has been authorized for detection and/or diagnosis of SARS-CoV-2 by FDA under an Emergency Use Authorization (EUA). This EUA will remain  in effect (meaning this test can be used) for the duration of the COVID-19 declaration under Se ction 564(b)(1) of the Act, 21 U.S.C. section 360bbb-3(b)(1), unless the authorization is terminated or revoked sooner.  Performed at [facility], [place] [address], [place], [place] [address]      Time coordinating discharge: 34mins  SIGNED:  Kathie Dike, MD  [facility] 01/18/2021, 11:20 AM   If 7PM-7AM, please contact night-coverage [email or web address]

## 2021-01-18 NOTE — TOC Progression Note (Addendum)
Transition of Care [facility]) - Progression Note    Patient Details  Name: Caroline Aguilar MRN: [id number] Date of Birth: 1925/07/22  Transition of [facility]) CM/SW Contact  Su Hilt, RN Phone Number: 01/18/2021, 8:56 AM  Clinical Narrative:    TOC CM reached out to Danielson at [employer] asking the status of the insurance auth, Awaiting a response, [employer] company requested updated PT notes for today, Once completed they will be faxed to the [facility]                                                 Social Determinants of Health (SDOH) Interventions    Readmission Risk Interventions No flowsheet data found.

## 2021-01-18 NOTE — Progress Notes (Signed)
[facility] is the facility pt is going to rm [phone], attempted to call report nurse never picked up the phone, called again and still unsuccessful. Pt was transported via EMS by stretcher to facility, d/c packet was given to the EMS transport services. Pt's belongings were  packed up and sent with pt.

## 2021-01-18 NOTE — TOC Progression Note (Signed)
Transition of Care [facility]) - Progression Note    Patient Details  Name: Caroline Aguilar MRN: [id number] Date of Birth: [birth year]  Transition of Care [facility] CM/SW [place], RN Phone Number: 01/18/2021, 1:33 PM  Clinical Narrative:    Received notice that the patient was approved to go to [facility] by insurance, The bedside nurse to call report to [phone],         Expected Discharge Plan and Services           Expected Discharge Date: 01/18/21                                     Social Determinants of Health (SDOH) Interventions    Readmission Risk Interventions No flowsheet data found.

## 2021-01-18 NOTE — Progress Notes (Signed)
Physical Therapy Treatment Patient Details Name: Caroline Aguilar MRN: [id number] DOB: [birth year] Today's Date: 01/18/2021   History of Present Illness 87 y.o. female with medical history significant of hypertension, hyperlipidemia, stroke, GERD, depression, colon cancer, atrial fibrillation on Eliquis, neuropathy, dCHF, frequent fall, compression fracture of the spine, who presents with multiple falls.  MD assessment includes: acute and chronic right rib fractures and chronic left rib fractures, L ankle sprain, and falls.  Pt/family also endorse recent diagnosis of lumbar compression fracture.    PT Comments    Pt pleasant and willing to work with PT, but R sided pain was a limiter.  She was able to ambulate further than prior sessions, but even with great effort, plenty of encouragement and good effort pain and fatigue were a limiting factor before we could even go 10 ft with slow, walker reliant in-room ambulation.  She was able to tolerate some light exercises but again pain and fatigue were quick to set in.  Good effort but poor tolerance, rehab continues to be the only safe and appropriate d/c disposition from a PT stand-point.  Recommendations for follow up therapy are one component of a multi-disciplinary discharge planning process, led by the attending physician.  Recommendations may be updated based on patient status, additional functional criteria and insurance authorization.  Follow Up Recommendations  SNF;Supervision/Assistance - 24 hour     Equipment Recommendations  None recommended by PT    Recommendations for Other Services       Precautions / Restrictions Precautions Precautions: Fall Restrictions Weight Bearing Restrictions: No     Mobility  Bed Mobility               General bed mobility comments: in recliner on arrival, returned to recliner post session    Transfers Overall transfer level: Needs assistance Equipment used: Rolling walker (2  wheeled) Transfers: Sit to/from Stand Sit to Stand: Mod assist         General transfer comment: Pt hesitant to do much movement, but did show decent effort with direct assist to initially shift and keep weight forward.  c/o increased R rib pain with using UE to rise and then to stabilize on the walker  Ambulation/Gait Ambulation/Gait assistance: Min guard Gait Distance (Feet): 8 Feet Assistive device: Rolling walker (2 wheeled)       General Gait Details: Pt quickly succumbs to pain and fatigue with laborious and brief bout of ambulation.  PT did put walker lower to allow less effortful use of R UE to take some weight but still she was clearly more reliant on the L UE on the walker, slowly veering to the L and even with this modest difference clearly becoming fatigued and having incresed pain with increased distance.  Ultimately needed to pull recliner behind her to let her sit as she could not push herself to get even to 10 ft despite great effort and motivation.   Stairs             Wheelchair Mobility    Modified Rankin (Stroke Patients Only)       Balance Overall balance assessment: Needs assistance;History of Falls Sitting-balance support: Feet supported Sitting balance-Leahy Scale: Fair     Standing balance support: Bilateral upper extremity supported;During functional activity Standing balance-Leahy Scale: Fair Standing balance comment: still very reliant on UEs (despite R flank pain), no LOBs but very guarded and hesitant with any standing activity  Cognition Arousal/Alertness: Awake/alert Behavior During Therapy: WFL for tasks assessed/performed Overall Cognitive Status: Within Functional Limits for tasks assessed                                        Exercises Other Exercises Other Exercises: gently resisted seated exercises: LAQ, seated marching, hip Ab/Ad 5-10X each b/l LEs    General Comments         Pertinent Vitals/Pain Pain Assessment: 0-10 Pain Score: 6  Pain Location: General back, RLEand L ankle pain, reports that R ribs/flank pain is even more with any movement    Home Living                      Prior Function            PT Goals (current goals can now be found in the care plan section) Progress towards PT goals: Progressing toward goals    Frequency    Min 2X/week      PT Plan Current plan remains appropriate    Co-evaluation              AM-PAC PT "6 Clicks" Mobility   Outcome Measure  Help needed turning from your back to your side while in a flat bed without using bedrails?: A Lot Help needed moving from lying on your back to sitting on the side of a flat bed without using bedrails?: A Lot Help needed moving to and from a bed to a chair (including a wheelchair)?: A Lot Help needed standing up from a chair using your arms (e.g., wheelchair or bedside chair)?: A Lot Help needed to walk in hospital room?: A Lot Help needed climbing 3-5 steps with a railing? : Total 6 Click Score: 11    End of Session Equipment Utilized During Treatment: Gait belt Activity Tolerance: Patient limited by pain;Patient limited by fatigue Patient left: with call bell/phone within reach;with chair alarm set   PT Visit Diagnosis: Unsteadiness on feet (R26.81);History of falling (Z91.81);Muscle weakness (generalized) (M62.81);Difficulty in walking, not elsewhere classified (R26.2);Pain Pain - Right/Left: Left Pain - part of body: Ankle and joints of foot;Hip     Time: 1015-1040 PT Time Calculation (min) (ACUTE ONLY): 25 min  Charges:  $Gait Training: 8-22 mins $Therapeutic Exercise: 8-22 mins                     Kreg Shropshire, DPT 01/18/2021, 11:27 AM

## 2021-01-20 DIAGNOSIS — I480 Paroxysmal atrial fibrillation: Secondary | ICD-10-CM | POA: Diagnosis not present

## 2021-01-20 DIAGNOSIS — I5032 Chronic diastolic (congestive) heart failure: Secondary | ICD-10-CM | POA: Diagnosis not present

## 2021-01-20 DIAGNOSIS — M8000XD Age-related osteoporosis with current pathological fracture, unspecified site, subsequent encounter for fracture with routine healing: Secondary | ICD-10-CM | POA: Diagnosis not present

## 2021-01-20 DIAGNOSIS — M48061 Spinal stenosis, lumbar region without neurogenic claudication: Secondary | ICD-10-CM | POA: Diagnosis not present

## 2021-01-21 DIAGNOSIS — E119 Type 2 diabetes mellitus without complications: Secondary | ICD-10-CM | POA: Diagnosis not present

## 2021-01-21 DIAGNOSIS — R4182 Altered mental status, unspecified: Secondary | ICD-10-CM | POA: Diagnosis not present

## 2021-01-21 DIAGNOSIS — I10 Essential (primary) hypertension: Secondary | ICD-10-CM | POA: Diagnosis not present

## 2021-02-28 DIAGNOSIS — R296 Repeated falls: Secondary | ICD-10-CM | POA: Diagnosis not present

## 2021-02-28 DIAGNOSIS — M818 Other osteoporosis without current pathological fracture: Secondary | ICD-10-CM | POA: Diagnosis not present

## 2021-02-28 DIAGNOSIS — S32010A Wedge compression fracture of first lumbar vertebra, initial encounter for closed fracture: Secondary | ICD-10-CM | POA: Diagnosis not present

## 2021-02-28 DIAGNOSIS — Z23 Encounter for immunization: Secondary | ICD-10-CM | POA: Diagnosis not present

## 2021-03-07 DIAGNOSIS — M170 Bilateral primary osteoarthritis of knee: Secondary | ICD-10-CM | POA: Diagnosis not present

## 2021-03-07 DIAGNOSIS — I480 Paroxysmal atrial fibrillation: Secondary | ICD-10-CM | POA: Diagnosis not present

## 2021-03-07 DIAGNOSIS — M800AXD Age-related osteoporosis with current pathological fracture, other site, subsequent encounter for fracture with routine healing: Secondary | ICD-10-CM | POA: Diagnosis not present

## 2021-03-07 DIAGNOSIS — I5032 Chronic diastolic (congestive) heart failure: Secondary | ICD-10-CM | POA: Diagnosis not present

## 2021-03-07 DIAGNOSIS — M8008XD Age-related osteoporosis with current pathological fracture, vertebra(e), subsequent encounter for fracture with routine healing: Secondary | ICD-10-CM | POA: Diagnosis not present

## 2021-03-07 DIAGNOSIS — I110 Hypertensive heart disease with heart failure: Secondary | ICD-10-CM | POA: Diagnosis not present

## 2021-03-07 DIAGNOSIS — M48061 Spinal stenosis, lumbar region without neurogenic claudication: Secondary | ICD-10-CM | POA: Diagnosis not present

## 2021-03-09 DIAGNOSIS — J40 Bronchitis, not specified as acute or chronic: Secondary | ICD-10-CM | POA: Diagnosis not present

## 2021-08-01 DIAGNOSIS — H401123 Primary open-angle glaucoma, left eye, severe stage: Secondary | ICD-10-CM | POA: Diagnosis not present

## 2021-08-01 DIAGNOSIS — Z0100 Encounter for examination of eyes and vision without abnormal findings: Secondary | ICD-10-CM | POA: Diagnosis not present

## 2021-08-31 DIAGNOSIS — H401132 Primary open-angle glaucoma, bilateral, moderate stage: Secondary | ICD-10-CM | POA: Diagnosis not present

## 2021-09-26 DIAGNOSIS — R300 Dysuria: Secondary | ICD-10-CM | POA: Diagnosis not present

## 2021-11-29 DIAGNOSIS — H401112 Primary open-angle glaucoma, right eye, moderate stage: Secondary | ICD-10-CM | POA: Diagnosis not present

## 2021-11-29 DIAGNOSIS — Z0100 Encounter for examination of eyes and vision without abnormal findings: Secondary | ICD-10-CM | POA: Diagnosis not present

## 2022-01-09 ENCOUNTER — Emergency Department: Payer: Medicare HMO

## 2022-01-09 ENCOUNTER — Inpatient Hospital Stay
Admission: EM | Admit: 2022-01-09 | Discharge: 2022-01-17 | DRG: 312 | Disposition: A | Discharge status: Skilled Nursing Facility | Payer: Medicare HMO | Attending: Internal Medicine | Admitting: Internal Medicine

## 2022-01-09 ENCOUNTER — Encounter: Payer: Self-pay | Admitting: Emergency Medicine

## 2022-01-09 ENCOUNTER — Other Ambulatory Visit: Payer: Self-pay

## 2022-01-09 DIAGNOSIS — I10 Essential (primary) hypertension: Secondary | ICD-10-CM | POA: Diagnosis not present

## 2022-01-09 DIAGNOSIS — Z79899 Other long term (current) drug therapy: Secondary | ICD-10-CM | POA: Diagnosis not present

## 2022-01-09 DIAGNOSIS — K219 Gastro-esophageal reflux disease without esophagitis: Secondary | ICD-10-CM | POA: Diagnosis present

## 2022-01-09 DIAGNOSIS — R54 Age-related physical debility: Secondary | ICD-10-CM | POA: Diagnosis present

## 2022-01-09 DIAGNOSIS — R531 Weakness: Secondary | ICD-10-CM | POA: Diagnosis not present

## 2022-01-09 DIAGNOSIS — Z888 Allergy status to other drugs, medicaments and biological substances status: Secondary | ICD-10-CM

## 2022-01-09 DIAGNOSIS — Z880 Allergy status to penicillin: Secondary | ICD-10-CM

## 2022-01-09 DIAGNOSIS — Z886 Allergy status to analgesic agent status: Secondary | ICD-10-CM

## 2022-01-09 DIAGNOSIS — E860 Dehydration: Secondary | ICD-10-CM | POA: Diagnosis present

## 2022-01-09 DIAGNOSIS — M7989 Other specified soft tissue disorders: Secondary | ICD-10-CM | POA: Diagnosis not present

## 2022-01-09 DIAGNOSIS — E538 Deficiency of other specified B group vitamins: Secondary | ICD-10-CM

## 2022-01-09 DIAGNOSIS — J9811 Atelectasis: Secondary | ICD-10-CM | POA: Diagnosis not present

## 2022-01-09 DIAGNOSIS — M722 Plantar fascial fibromatosis: Secondary | ICD-10-CM | POA: Diagnosis not present

## 2022-01-09 DIAGNOSIS — M533 Sacrococcygeal disorders, not elsewhere classified: Secondary | ICD-10-CM

## 2022-01-09 DIAGNOSIS — T502X5A Adverse effect of carbonic-anhydrase inhibitors, benzothiadiazides and other diuretics, initial encounter: Secondary | ICD-10-CM | POA: Diagnosis not present

## 2022-01-09 DIAGNOSIS — M25551 Pain in right hip: Secondary | ICD-10-CM | POA: Diagnosis not present

## 2022-01-09 DIAGNOSIS — F32A Depression, unspecified: Secondary | ICD-10-CM | POA: Diagnosis not present

## 2022-01-09 DIAGNOSIS — F419 Anxiety disorder, unspecified: Secondary | ICD-10-CM | POA: Diagnosis present

## 2022-01-09 DIAGNOSIS — R600 Localized edema: Secondary | ICD-10-CM | POA: Diagnosis present

## 2022-01-09 DIAGNOSIS — R0602 Shortness of breath: Secondary | ICD-10-CM | POA: Diagnosis not present

## 2022-01-09 DIAGNOSIS — I951 Orthostatic hypotension: Secondary | ICD-10-CM | POA: Diagnosis not present

## 2022-01-09 DIAGNOSIS — Z87891 Personal history of nicotine dependence: Secondary | ICD-10-CM | POA: Diagnosis not present

## 2022-01-09 DIAGNOSIS — R0609 Other forms of dyspnea: Secondary | ICD-10-CM | POA: Diagnosis present

## 2022-01-09 DIAGNOSIS — M79604 Pain in right leg: Secondary | ICD-10-CM | POA: Diagnosis not present

## 2022-01-09 DIAGNOSIS — E785 Hyperlipidemia, unspecified: Secondary | ICD-10-CM | POA: Diagnosis not present

## 2022-01-09 DIAGNOSIS — R296 Repeated falls: Secondary | ICD-10-CM

## 2022-01-09 DIAGNOSIS — G629 Polyneuropathy, unspecified: Secondary | ICD-10-CM | POA: Diagnosis present

## 2022-01-09 DIAGNOSIS — R262 Difficulty in walking, not elsewhere classified: Secondary | ICD-10-CM

## 2022-01-09 DIAGNOSIS — I5032 Chronic diastolic (congestive) heart failure: Secondary | ICD-10-CM | POA: Diagnosis not present

## 2022-01-09 DIAGNOSIS — I480 Paroxysmal atrial fibrillation: Secondary | ICD-10-CM | POA: Diagnosis not present

## 2022-01-09 DIAGNOSIS — N179 Acute kidney failure, unspecified: Secondary | ICD-10-CM | POA: Diagnosis not present

## 2022-01-09 DIAGNOSIS — I110 Hypertensive heart disease with heart failure: Secondary | ICD-10-CM | POA: Diagnosis present

## 2022-01-09 DIAGNOSIS — W19XXXA Unspecified fall, initial encounter: Secondary | ICD-10-CM | POA: Diagnosis not present

## 2022-01-09 DIAGNOSIS — R609 Edema, unspecified: Secondary | ICD-10-CM | POA: Diagnosis not present

## 2022-01-09 DIAGNOSIS — Z85038 Personal history of other malignant neoplasm of large intestine: Secondary | ICD-10-CM | POA: Diagnosis not present

## 2022-01-09 DIAGNOSIS — Z8249 Family history of ischemic heart disease and other diseases of the circulatory system: Secondary | ICD-10-CM

## 2022-01-09 DIAGNOSIS — R69 Illness, unspecified: Secondary | ICD-10-CM | POA: Diagnosis not present

## 2022-01-09 DIAGNOSIS — R918 Other nonspecific abnormal finding of lung field: Secondary | ICD-10-CM | POA: Diagnosis not present

## 2022-01-09 DIAGNOSIS — Z1152 Encounter for screening for COVID-19: Secondary | ICD-10-CM | POA: Diagnosis not present

## 2022-01-09 DIAGNOSIS — I5031 Acute diastolic (congestive) heart failure: Secondary | ICD-10-CM | POA: Diagnosis not present

## 2022-01-09 DIAGNOSIS — Z8673 Personal history of transient ischemic attack (TIA), and cerebral infarction without residual deficits: Secondary | ICD-10-CM

## 2022-01-09 DIAGNOSIS — Z7401 Bed confinement status: Secondary | ICD-10-CM | POA: Diagnosis not present

## 2022-01-09 DIAGNOSIS — Z7901 Long term (current) use of anticoagulants: Secondary | ICD-10-CM | POA: Diagnosis not present

## 2022-01-09 DIAGNOSIS — Z9889 Other specified postprocedural states: Secondary | ICD-10-CM | POA: Diagnosis not present

## 2022-01-09 DIAGNOSIS — Z885 Allergy status to narcotic agent status: Secondary | ICD-10-CM

## 2022-01-09 LAB — CBC WITH DIFFERENTIAL/PLATELET
Abs Immature Granulocytes: 0.02 10*3/uL (ref 0.00–0.07)
Basophils Absolute: 0 10*3/uL (ref 0.0–0.1)
Basophils Relative: 0 %
Eosinophils Absolute: 0.2 10*3/uL (ref 0.0–0.5)
Eosinophils Relative: 3 %
HCT: 40.6 % (ref 36.0–46.0)
Hemoglobin: 12.6 g/dL (ref 12.0–15.0)
Immature Granulocytes: 0 %
Lymphocytes Relative: 23 %
Lymphs Abs: 1.6 10*3/uL (ref 0.7–4.0)
MCH: 29.2 pg (ref 26.0–34.0)
MCHC: 31 g/dL (ref 30.0–36.0)
MCV: 94 fL (ref 80.0–100.0)
Monocytes Absolute: 0.7 10*3/uL (ref 0.1–1.0)
Monocytes Relative: 9 %
Neutro Abs: 4.5 10*3/uL (ref 1.7–7.7)
Neutrophils Relative %: 65 %
Platelets: 187 10*3/uL (ref 150–400)
RBC: 4.32 MIL/uL (ref 3.87–5.11)
RDW: 13 % (ref 11.5–15.5)
WBC: 6.9 10*3/uL (ref 4.0–10.5)
nRBC: 0 % (ref 0.0–0.2)

## 2022-01-09 LAB — URINALYSIS, ROUTINE W REFLEX MICROSCOPIC
Bilirubin Urine: NEGATIVE
Glucose, UA: NEGATIVE mg/dL
Hgb urine dipstick: NEGATIVE
Ketones, ur: NEGATIVE mg/dL
Leukocytes,Ua: NEGATIVE
Nitrite: NEGATIVE
Protein, ur: NEGATIVE mg/dL
Specific Gravity, Urine: 1.015 (ref 1.005–1.030)
pH: 5 (ref 5.0–8.0)

## 2022-01-09 LAB — RESP PANEL BY RT-PCR (FLU A&B, COVID) ARPGX2
Influenza A by PCR: NEGATIVE
Influenza B by PCR: NEGATIVE
SARS Coronavirus 2 by RT PCR: NEGATIVE

## 2022-01-09 LAB — COMPREHENSIVE METABOLIC PANEL
ALT: <5 U/L (ref 0–44)
AST: 18 U/L (ref 15–41)
Albumin: 3.3 g/dL — ABNORMAL LOW (ref 3.5–5.0)
Alkaline Phosphatase: 97 U/L (ref 38–126)
Anion gap: 6 (ref 5–15)
BUN: 41 mg/dL — ABNORMAL HIGH (ref 8–23)
CO2: 28 mmol/L (ref 22–32)
Calcium: 8.4 mg/dL — ABNORMAL LOW (ref 8.9–10.3)
Chloride: 100 mmol/L (ref 98–111)
Creatinine, Ser: 0.87 mg/dL (ref 0.44–1.00)
GFR, Estimated: >60 mL/min (ref >60)
Glucose, Bld: 133 mg/dL — ABNORMAL HIGH (ref 70–99)
Potassium: 4.9 mmol/L (ref 3.5–5.1)
Sodium: 134 mmol/L — ABNORMAL LOW (ref 135–145)
Total Bilirubin: 0.4 mg/dL (ref 0.3–1.2)
Total Protein: 6.4 g/dL — ABNORMAL LOW (ref 6.5–8.1)

## 2022-01-09 LAB — BRAIN NATRIURETIC PEPTIDE: B Natriuretic Peptide: 127.8 pg/mL — ABNORMAL HIGH (ref 0.0–100.0)

## 2022-01-09 MED ORDER — ONDANSETRON HCL 4 MG PO TABS: 4.0000 mg | ORAL_TABLET | Freq: Four times a day (QID) | ORAL | Status: DC | PRN

## 2022-01-09 MED ORDER — OXYCODONE HCL 5 MG PO TABS: 2.5000 mg | ORAL_TABLET | ORAL | Status: DC | PRN

## 2022-01-09 MED ORDER — GABAPENTIN 100 MG PO CAPS
100.0000 mg | ORAL_CAPSULE | Freq: Every day | ORAL | Status: DC
  Administered 2022-01-10 – 2022-01-16 (×8): 100 mg via ORAL
  Filled 2022-01-09 (×8): qty 1

## 2022-01-09 MED ORDER — LIDOCAINE 5 % EX PTCH
1.0000 | MEDICATED_PATCH | CUTANEOUS | Status: DC
  Administered 2022-01-10 – 2022-01-16 (×8): 1 via TRANSDERMAL
  Filled 2022-01-09 (×8): qty 1

## 2022-01-09 MED ORDER — SENNOSIDES-DOCUSATE SODIUM 8.6-50 MG PO TABS: 1.0000 | ORAL_TABLET | Freq: Every evening | ORAL | Status: DC | PRN

## 2022-01-09 MED ORDER — ONDANSETRON HCL 4 MG/2ML IJ SOLN: 4.0000 mg | Freq: Four times a day (QID) | INTRAMUSCULAR | Status: DC | PRN

## 2022-01-09 MED ORDER — BUPROPION HCL ER (XL) 150 MG PO TB24
150.0000 mg | ORAL_TABLET | ORAL | Status: DC
  Administered 2022-01-10 – 2022-01-17 (×8): 150 mg via ORAL
  Filled 2022-01-09 (×8): qty 1

## 2022-01-09 MED ORDER — TIMOLOL MALEATE 0.5 % OP SOLN: 1.0000 [drp] | Freq: Two times a day (BID) | OPHTHALMIC | Status: DC

## 2022-01-09 MED ORDER — SERTRALINE HCL 50 MG PO TABS
50.0000 mg | ORAL_TABLET | ORAL | Status: DC
  Administered 2022-01-10 – 2022-01-17 (×8): 50 mg via ORAL
  Filled 2022-01-09 (×8): qty 1

## 2022-01-09 MED ORDER — HYDRALAZINE HCL 10 MG PO TABS: 10.0000 mg | ORAL_TABLET | Freq: Four times a day (QID) | ORAL | Status: AC | PRN

## 2022-01-09 MED ORDER — LATANOPROST 0.005 % OP SOLN
1.0000 [drp] | Freq: Every day | OPHTHALMIC | Status: DC
  Administered 2022-01-11 – 2022-01-16 (×6): 1 [drp] via OPHTHALMIC
  Filled 2022-01-09: qty 2.5

## 2022-01-09 MED ORDER — ACETAMINOPHEN 325 MG PO TABS: 650.0000 mg | ORAL_TABLET | Freq: Four times a day (QID) | ORAL | Status: AC | PRN

## 2022-01-09 MED ORDER — DOCUSATE SODIUM 100 MG PO CAPS: 100.0000 mg | ORAL_CAPSULE | Freq: Every day | ORAL | Status: DC | PRN

## 2022-01-09 MED ORDER — APIXABAN 2.5 MG PO TABS
2.5000 mg | ORAL_TABLET | Freq: Two times a day (BID) | ORAL | Status: DC
  Administered 2022-01-10 – 2022-01-17 (×16): 2.5 mg via ORAL
  Filled 2022-01-09 (×16): qty 1

## 2022-01-09 MED ORDER — ACETAMINOPHEN 650 MG RE SUPP: 650.0000 mg | Freq: Four times a day (QID) | RECTAL | Status: AC | PRN

## 2022-01-09 MED ORDER — SIMVASTATIN 20 MG PO TABS
20.0000 mg | ORAL_TABLET | Freq: Every day | ORAL | Status: DC
  Administered 2022-01-10 – 2022-01-16 (×8): 20 mg via ORAL
  Filled 2022-01-09 (×7): qty 1
  Filled 2022-01-09: qty 2

## 2022-01-09 MED ORDER — PANTOPRAZOLE SODIUM 40 MG PO TBEC
80.0000 mg | DELAYED_RELEASE_TABLET | Freq: Every day | ORAL | Status: DC
  Administered 2022-01-10 – 2022-01-17 (×8): 80 mg via ORAL
  Filled 2022-01-09 (×8): qty 2

## 2022-01-09 NOTE — ED Triage Notes (Addendum)
Pt via EMS from home after family member called c/o generalized weakness x 1 week. Pt is ambulatory and independent at baseline but over the past week has had multiple falls at home, increased need for ADL assistance, decreased PO intake, and BLE edema with shear wound to LLE. Pt is a/o x 4 on arrival. Pt states her only concern is recent SOB.

## 2022-01-09 NOTE — Assessment & Plan Note (Signed)
-   Simvastatin 20 mg daily resumed 

## 2022-01-09 NOTE — Assessment & Plan Note (Signed)
Blood pressure elevated.  Apparently taking 2.5 mg of amlodipine at home. -Re start home amlodipine 2.5 mg daily - Hydralazine 10 mg p.o. every 6 hours as needed for SBP greater than 180, 4 days ordered -Continue to monitor

## 2022-01-09 NOTE — Hospital Course (Addendum)
Ms. Caroline Aguilar is a 88 year old female with history of hyperlipidemia, depression, anxiety, hypertension, atrial fibrillation, history of stroke, who presents emergency department for chief concerns of multiple falls and weakness, most of the symptoms are related when she was trying to stand up quickly from sitting position.  Also endorsed poor p.o. intake.  Having difficulty with performing her ADLs.  ROS positive for exertional dyspnea for about a year.  She lives alone, son and daughter-in-law who lives across the street take care of her.  Initial vitals in the ED showed temperature of 98, respiration rate of 19, heart rate of 65, blood pressure 96/55, SPO2 of 94% on room air.  Serum sodium is 134, potassium 4.9, chloride 100, bicarb 28, BUN of 41, serum creatinine 0.87, GFR greater than 60, nonfasting blood glucose 133, WBC 6.9, hemoglobin 12.6, platelets of 187.  COVID/influenza A/influenza B PCR were negative. UA was negative for leukocytes and nitrates.  BNP was 127.8  Portable chest x-ray, right ankle x-ray, right foot complete x-ray, right hip x-ray: Was read as negative for fracture.  ED treatment: None  9/26: Patient again became weak and dizzy while getting out of bed, blood pressure dropped to 92/46 and she received 500 cc of bolus. Patient was only taking 2.5 mg of amlodipine daily per daughter.  Most likely dehydration secondary to poor p.o. intake. Orthostatic vital seems positive. PT/OT are recommending SNF-patient and family agrees. B12 at 196-starting supplement.  Patient short of breath on 01/11/2022 dependent low-dose Lasix 20 mg twice daily and creatinine impaired up to 1.14.  Lasix held on 01/12/2022.  Her right sacroiliac pain was better after 2 doses of Naprosyn on 01/11/2022.  01/13/2022 her creatinine was still impaired at 1.17. She was complaining of right heel pain consistent with plantars fasciitis.  She was orthostatic with occupational therapy with her blood  pressure dropping down to 85/61 with standing.  01/14/2022.  Still orthostatic with nursing staff.  Another fluid bolus and midodrine started.  CT scan of the chest negative for pneumonia or pulmonary edema.  01/15/2022 and 01/16/2022.  Patient not feeling well with standing up and almost passed out.

## 2022-01-09 NOTE — H&P (Addendum)
History and Physical   Caroline Aguilar YDX:[id number] DOB: 26-Nov-1925 DOA: 01/09/2022  PCP: [name], MD  Patient coming from: Home  I have personally briefly reviewed patient's old medical records in [place].  Chief Concern: Frequent falls  HPI: Ms. Caroline Aguilar is a 88 year old female with history of hyperlipidemia, depression, anxiety, hypertension, atrial fibrillation, history of stroke, who presents emergency department for chief concerns of multiple falls and weakness.  Initial vitals in the ED showed temperature of 98, respiration rate of 19, heart rate of 65, blood pressure 96/55, SPO2 of 94% on room air.  Serum sodium is 134, potassium 4.9, chloride 100, bicarb 28, BUN of 41, serum creatinine 0.87, GFR greater than 60, nonfasting blood glucose 133, WBC 6.9, hemoglobin 12.6, platelets of 187.  COVID/influenza A/influenza B PCR were negative. UA was negative for leukocytes and nitrates.  BNP was 127.8  Portable chest x-ray, right ankle x-ray, right foot complete x-ray, right hip x-ray: Was read as negative for fracture.  ED treatment: None --------------------------------- At bedside, she is able to tell me her name, age, current calendar year.   Patient reports that she has had increasing weakness over the last week.  She reports on Wednesday, 01/04/2022, patient was getting up from a sitting position as the phone was ringing she reached for her phone and she said she felt sudden weakness and she fell.  Daughter-in-law at bedside states that at baseline patient uses a walker however that if she forgot to grab her walker, phone rang.  Patient and family denies head trauma.  Patient fell landed on her bottom and prior to sitting on the floor, patient hit her right side against the chair.  She did not have any immediate weakness however this is gradually been increasing over the last week.  She endorses poor p.o. intake.  She reports that she is having difficulty  ambulating around her house and performing her ADLs.  Patient is not able to get up and ambulate around the room in the ED.  She endorses worsening shortness of breath with exertion and this has been ongoing for nearly a year.  She denies changes to the swelling of her lower extremities.  She denies any nausea, vomiting, fever, cough, chest pain, abdominal pain, dysuria, hematuria, diarrhea.  Social history: She lives in her own home.  She denies tobacco, EtOH, recreational drug use.  Her daughter-in-law and son live across the street and they take turns caring for her in her own home.  ROS:  Constitutional: no weight change, no fever ENT/Mouth: no sore throat, no rhinorrhea Eyes: no eye pain, no vision changes Cardiovascular: no chest pain, + dyspnea,  no edema, no palpitations Respiratory: no cough, no sputum, no wheezing Gastrointestinal: no nausea, no vomiting, no diarrhea, no constipation Genitourinary: no urinary incontinence, no dysuria, no hematuria Musculoskeletal: no arthralgias, no myalgias Skin: no skin lesions, no pruritus, Neuro: + weakness, no loss of consciousness, no syncope Psych: no anxiety, no depression, + decrease appetite Heme/Lymph: no bruising, no bleeding  ED Course: Discussed with emergency medicine provider, patient requiring hospitalization for chief concerns of frequent falls and unsafe discharge home.  Assessment/Plan  Principal Problem:   Frequent falls Active Problems:   AF (paroxysmal atrial fibrillation) (HCC)   HLD (hyperlipidemia)   GERD (gastroesophageal reflux disease)   Depression   HTN (hypertension)   Assessment and Plan:  * Frequent falls With weakness - Fall precautions - Resumed home oxycodone 2.5 mg p.o. every 3 hours  as needed for moderate pain, 3 doses - Check B12, magnesium - PT, OT, TOC consulted  HTN (hypertension) - Hydralazine 10 mg p.o. every 6 hours as needed for SBP greater than 180, 4 days ordered  Depression -  Bupropion 150 mg in AM, sertraline 50 mg daily, resumed  GERD (gastroesophageal reflux disease) - PPI  HLD (hyperlipidemia) - Simvastatin 20 mg daily resumed  AF (paroxysmal atrial fibrillation) (HCC) - Apixaban 2.5 mg PO BID resumed  Chart reviewed.   DVT prophylaxis: apixaban 2.5 mg BID  Code Status: Full code Diet: Heart healthy Family Communication: Updated daughter-in-law at bedside with patient's permission Disposition Plan: Pending clinical course Consults called: PT, OT, TOC Admission status: MedSurg, observation  Past Medical History:  Diagnosis Date   A-fib ([place])    Angina pectoris ([place])    Cancer ([place])    Colon cancer   Falls frequently    GERD (gastroesophageal reflux disease)    Hyperlipidemia    Neuropathy    Past Surgical History:  Procedure Laterality Date   BONE EXCISION Right 11/14/2019   Procedure: arthroplasty right foot fifth toe;  Surgeon: Sharlotte Alamo, DPM;  Location: [facility] ORS;  Service: Podiatry;  Laterality: Right;   CARDIAC CATHETERIZATION N/A 07/19/2015   Procedure: Right and Left Heart Cath;  Surgeon: Isaias Cowman, MD;  Location: [facility];  Service: Cardiovascular;  Laterality: N/A;   CATARACT EXTRACTION Bilateral    COLON SURGERY     ESOPHAGOGASTRODUODENOSCOPY N/A 07/08/2015   Procedure: ESOPHAGOGASTRODUODENOSCOPY (EGD);  Surgeon: Hulen Luster, MD;  Location: [facility] ENDOSCOPY;  Service: Endoscopy;  Laterality: N/A;   FEMUR FRACTURE SURGERY     FOREARM SURGERY     HIP SURGERY Bilateral    KYPHOPLASTY N/A 06/11/2018   Procedure: KYPHOPLASTY L4;  Surgeon: Hessie Knows, MD;  Location: [facility] ORS;  Service: Orthopedics;  Laterality: N/A;   KYPHOPLASTY N/A 12/17/2019   Procedure: L1 Kyphoplasty;  Surgeon: Hessie Knows, MD;  Location: [facility] ORS;  Service: Orthopedics;  Laterality: N/A;   VALVE REPLACEMENT  2017   @ [facility]   Social History:  reports that she quit smoking about 76 years ago. Her smoking use included cigarettes. She has never  used smokeless tobacco. She reports that she does not drink alcohol and does not use drugs.  Allergies  Allergen Reactions   Penicillins Anaphylaxis and Other (See Comments)    Has patient had a PCN reaction causing immediate rash, facial/tongue/throat swelling, SOB or lightheadedness with hypotension: Yes Has patient had a PCN reaction causing severe rash involving mucus membranes or skin necrosis: No Has patient had a PCN reaction that required hospitalization No Has patient had a PCN reaction occurring within the last 10 years: No If all of the above answers are "NO", then may proceed with Cephalosporin use.   Penicillins Anaphylaxis   Azithromycin Other (See Comments)    Reaction:  Unknown    Cortisone     Other reaction(s): Hallucination   Famciclovir Other (See Comments)    Reaction:  Unknown    Meloxicam    Morphine    Streptomycin    Tramadol     Other reaction(s): Hallucination   Venlafaxine     Other reaction(s): Dizziness   Family History  Problem Relation Age of Onset   Cancer Mother    Heart failure Father    Family history: Family history reviewed and not pertinent.  Prior to Admission medications   Medication Sig Start Date End Date Taking? Authorizing Provider  acetaminophen (TYLENOL) 325 MG tablet Take 650 mg by mouth every 6 (six) hours as needed for moderate pain.     [provider]  amLODipine (NORVASC) 5 MG tablet Take 2.5 mg by mouth 2 (two) times daily.    [provider]  apixaban (ELIQUIS) 2.5 MG TABS tablet Take 2.5 mg by mouth 2 (two) times daily.     [provider]  buPROPion (WELLBUTRIN XL) 150 MG 24 hr tablet Take 150 mg by mouth every morning.  10/20/19   [provider]  Calcium Carbonate-Vitamin D 600-400 MG-UNIT tablet Take 1 tablet by mouth daily.  Patient not taking: No sig reported    [provider]  docusate sodium (COLACE) 100 MG capsule Take 100 mg by mouth daily.     [provider]   gabapentin (NEURONTIN) 100 MG capsule Take 100 mg by mouth at bedtime. 10/20/19   [provider]  latanoprost (XALATAN) 0.005 % ophthalmic solution Place 1 drop into both eyes at bedtime.     [provider]  lidocaine (LIDODERM) 5 % Place 1 patch onto the skin daily. Remove & Discard patch within 12 hours or as directed by MD 01/14/21   Kathie Dike, MD  omeprazole (PRILOSEC) 20 MG capsule Take 20 mg by mouth at bedtime.     [provider]  oxyCODONE (OXY IR/ROXICODONE) 5 MG immediate release tablet Take 0.5 tablets (2.5 mg total) by mouth every 3 (three) hours as needed for moderate pain. 01/14/21   Kathie Dike, MD  predniSONE (DELTASONE) 20 MG tablet Take 1 tablet (20 mg total) by mouth daily with breakfast. Until 10/8 01/19/21   Kathie Dike, MD  sertraline (ZOLOFT) 50 MG tablet Take 50 mg by mouth every morning.     [provider]  simvastatin (ZOCOR) 20 MG tablet Take 20 mg by mouth daily at 6 PM.  05/18/18   [provider]  timolol (TIMOPTIC) 0.5 % ophthalmic solution Place 1 drop into both eyes 2 (two) times daily.    [provider]   Physical Exam: Vitals:   01/09/22 1808 01/09/22 1900 01/09/22 1930 01/09/22 2036  BP:  (!) 155/68 (!) 160/70 (!) 153/62  Pulse:  65 97 65  Resp:  '19 17 19  '$ Temp: 98.1 F (36.7 C)   98.3 F (36.8 C)  TempSrc: Oral   Oral  SpO2:  97%  95%  Weight:      Height:       Constitutional: appears age-appropriate, frail, NAD, calm, comfortable Eyes: PERRL, lids and conjunctivae normal ENMT: Mucous membranes are moist. Posterior pharynx clear of any exudate or lesions. Age-appropriate dentition. Hearing appropriate Neck: normal, supple, no masses, no thyromegaly Respiratory: clear to auscultation bilaterally, no wheezing, no crackles. Normal respiratory effort. No accessory muscle use.  Cardiovascular: Regular rate and rhythm, no murmurs / rubs / gallops. No extremity edema. 2+ pedal pulses. No  carotid bruits.  Abdomen: no tenderness, no masses palpated, no hepatosplenomegaly. Bowel sounds positive.  Musculoskeletal: no clubbing / cyanosis. No joint deformity upper and lower extremities. Good ROM, no contractures, no atrophy. Normal muscle tone.  Skin: no rashes, lesions, ulcers. No induration Neurologic: Sensation intact. Strength 5/5 in all 4.  Psychiatric: Normal judgment and insight. Alert and oriented x 3. Normal mood.   EKG: independently reviewed, showing sinus rhythm with rate of 63, QTc 439  Chest x-ray on Admission: I personally reviewed and I agree with radiologist reading as below.  DG Hip Unilat W  or Wo Pelvis 2-3 Views Right  Result Date: 01/09/2022 CLINICAL DATA:  Hip pain after fall EXAM: DG HIP (WITH OR WITHOUT PELVIS) 2-3V RIGHT COMPARISON:  None FINDINGS: Remote posttraumatic and postsurgical changes noted in the hips bilaterally. Degenerative changes with joint space narrowing and spurring. No acute fracture, subluxation or dislocation. Diffuse vascular calcifications. IMPRESSION: No acute bony abnormality. Electronically Signed   By: Rolm Baptise M.D.   On: 01/09/2022 17:02   DG Ankle Complete Right  Result Date: 01/09/2022 CLINICAL DATA:  pain post fall EXAM: RIGHT ANKLE - COMPLETE 3+ VIEW; RIGHT FOOT COMPLETE - 3+ VIEW COMPARISON:  Foot radiograph February 2012. FINDINGS: Right ankle: Diffuse osteopenia. There is no evidence of acute fracture. Alignment is normal. There is mild tibiotalar and subtalar degenerative change. Mild midfoot degenerative change. Tiny plantar and dorsal calcaneal spurs. There is a soft tissue swelling. Right left foot: Diffuse osteopenia. There is no evidence of acute fracture. There is a chronic posttraumatic deformity of the fifth metatarsal and potentially of the fourth and fifth toe proximal phalanges. There is soft tissue swelling of the foot. There is lucency in the medial navicular favored to reflect subchondral cystic change, and  similar to prior exam in February 2012. Adjacent os navicularis. There is mild first MTP and mild-to-moderate diffuse interphalangeal joint osteoarthritis. Mild midfoot degenerative change. IMPRESSION: Soft tissue swelling without evidence of acute fracture in the right ankle or foot. Electronically Signed   By: Maurine Simmering M.D.   On: 01/09/2022 15:17   DG Foot Complete Right  Result Date: 01/09/2022 CLINICAL DATA:  pain post fall EXAM: RIGHT ANKLE - COMPLETE 3+ VIEW; RIGHT FOOT COMPLETE - 3+ VIEW COMPARISON:  Foot radiograph February 2012. FINDINGS: Right ankle: Diffuse osteopenia. There is no evidence of acute fracture. Alignment is normal. There is mild tibiotalar and subtalar degenerative change. Mild midfoot degenerative change. Tiny plantar and dorsal calcaneal spurs. There is a soft tissue swelling. Right left foot: Diffuse osteopenia. There is no evidence of acute fracture. There is a chronic posttraumatic deformity of the fifth metatarsal and potentially of the fourth and fifth toe proximal phalanges. There is soft tissue swelling of the foot. There is lucency in the medial navicular favored to reflect subchondral cystic change, and similar to prior exam in February 2012. Adjacent os navicularis. There is mild first MTP and mild-to-moderate diffuse interphalangeal joint osteoarthritis. Mild midfoot degenerative change. IMPRESSION: Soft tissue swelling without evidence of acute fracture in the right ankle or foot. Electronically Signed   By: Maurine Simmering M.D.   On: 01/09/2022 15:17   DG Chest 1 View  Result Date: 01/09/2022 CLINICAL DATA:  Shortness of breath EXAM: CHEST  1 VIEW COMPARISON:  Radiograph 01/11/2021 FINDINGS: Cardiomediastinal silhouette is unchanged with prior TAVR and aortic arch calcifications. Moderate central opacities, similar to prior exams. No large pleural effusion. There is no evidence of pneumothorax. There are chronic left lateral rib deformities. There is severe degenerative  change of the shoulders. Thoracic spondylosis with similar compression deformities. IMPRESSION: Mild interstitial opacities could reflect a degree of interstitial edema or chronic interstitial change. No focal airspace consolidation. Electronically Signed   By: Maurine Simmering M.D.   On: 01/09/2022 15:11    Labs on Admission: I have personally reviewed following labs  CBC: Recent Labs  Lab 01/09/22 1412  WBC 6.9  NEUTROABS 4.5  HGB 12.6  HCT 40.6  MCV 94.0  PLT 761   Basic Metabolic Panel: Recent Labs  Lab 01/09/22 1412  NA 134*  K 4.9  CL 100  CO2 28  GLUCOSE 133*  BUN 41*  CREATININE 0.87  CALCIUM 8.4*   GFR: Estimated Creatinine Clearance: 33.5 mL/min (by C-G formula based on SCr of 0.87 mg/dL).  Liver Function Tests: Recent Labs  Lab 01/09/22 1412  AST 18  ALT <5  ALKPHOS 97  BILITOT 0.4  PROT 6.4*  ALBUMIN 3.3*   Urine analysis:    Component Value Date/Time   COLORURINE YELLOW (A) 01/09/2022 1508   APPEARANCEUR HAZY (A) 01/09/2022 1508   LABSPEC 1.015 01/09/2022 1508   PHURINE 5.0 01/09/2022 1508   GLUCOSEU NEGATIVE 01/09/2022 1508   HGBUR NEGATIVE 01/09/2022 1508   BILIRUBINUR NEGATIVE 01/09/2022 1508   KETONESUR NEGATIVE 01/09/2022 1508   PROTEINUR NEGATIVE 01/09/2022 1508   NITRITE NEGATIVE 01/09/2022 1508   LEUKOCYTESUR NEGATIVE 01/09/2022 1508   Dr. Tobie Poet [facility]  If 7PM-7AM, please contact overnight-coverage provider If 7AM-7PM, please contact day coverage provider [email or web address]  01/09/2022, 10:35 PM

## 2022-01-09 NOTE — ED Provider Notes (Signed)
[facility] Provider Note    Event Date/Time   First MD Initiated Contact with Patient 01/09/22 1406     (approximate)   History   Shortness of Breath   HPI  Caroline Aguilar is a 88 y.o. female with history of A-fib, hyperlipidemia, A-fib, CVA, hypertension, and as listed in EMR presents to the emergency department via EMS for treatment and evaluation of generalized weakness for the past week.  She is ambulatory with her walker at baseline but has been unable to walk for approximately 1 week.  Family reports to EMS that she has had decreased p.o. intake and bilateral lower extremity edema.  Also per report, she does not like to take her Lasix and has not taken it in the past several days.  Patient's concern is recent fall with pain in her right lower extremity and exertional dyspnea.  No fever, chest pain, nausea, vomiting, diarrhea.     Physical Exam   Triage Vital Signs: ED Triage Vitals  Enc Vitals Group     BP 01/09/22 1400 (!) 96/55     Pulse Rate 01/09/22 1400 65     Resp 01/09/22 1400 19     Temp 01/09/22 1400 98 F (36.7 C)     Temp Source 01/09/22 1400 Oral     SpO2 01/09/22 1400 94 %     Weight 01/09/22 1402 145 lb (65.8 kg)     Height 01/09/22 1402 5' (1.524 m)     Head Circumference --      Peak Flow --      Pain Score 01/09/22 1401 8     Pain Loc --      Pain Edu? --      Excl. in [place]? --     Most recent vital signs: Vitals:   01/09/22 1800 01/09/22 1808  BP: (!) 145/48   Pulse: 63   Resp: (!) 22   Temp:  98.1 F (36.7 C)  SpO2: 98%      General: Awake, no distress.  CV:  Good peripheral perfusion. DP pulses 2+ bilaterally. Resp:  Normal effort. Breath sounds clear. Abd:  No distention. Nontender to palpation. Other:  Skin tear to the left lower extremity--wound edges well approximated with Steri-Strips.  No surrounding erythema.  No drainage.   Exam of the right foot and ankle without acute findings.  No deformity.  No  focal tenderness.  [place] ankle rules are negative.   ED Results / Procedures / Treatments   Labs (all labs ordered are listed, but only abnormal results are displayed) Labs Reviewed  COMPREHENSIVE METABOLIC PANEL - Abnormal; Notable for the following components:      Result Value   Sodium 134 (*)    Glucose, Bld 133 (*)    BUN 41 (*)    Calcium 8.4 (*)    Total Protein 6.4 (*)    Albumin 3.3 (*)    All other components within normal limits  URINALYSIS, ROUTINE W REFLEX MICROSCOPIC - Abnormal; Notable for the following components:   Color, Urine YELLOW (*)    APPearance HAZY (*)    Bacteria, UA MANY (*)    All other components within normal limits  BRAIN NATRIURETIC PEPTIDE - Abnormal; Notable for the following components:   B Natriuretic Peptide 127.8 (*)    All other components within normal limits  RESP PANEL BY RT-PCR (FLU A&B, COVID) ARPGX2  CBC WITH DIFFERENTIAL/PLATELET  BASIC METABOLIC PANEL  CBC  VITAMIN B12  PROCALCITONIN     EKG  Sinus rhythm ventricular rate of 63   RADIOLOGY Images were viewed and interpreted by me in addition to review of the radiologist report:  Chest x-ray shows no acute findings. X-ray of the right foot and ankle negative for bony abnormality. X-ray of the right hip negative for acute findings.   PROCEDURES:  Critical Care performed: No  Procedures   MEDICATIONS ORDERED IN ED: Medications  oxyCODONE (Oxy IR/ROXICODONE) immediate release tablet 2.5 mg (has no administration in time range)  acetaminophen (TYLENOL) tablet 650 mg (has no administration in time range)    Or  acetaminophen (TYLENOL) suppository 650 mg (has no administration in time range)  ondansetron (ZOFRAN) tablet 4 mg (has no administration in time range)    Or  ondansetron (ZOFRAN) injection 4 mg (has no administration in time range)  senna-docusate (Senokot-S) tablet 1 tablet (has no administration in time range)     IMPRESSION / MDM / ASSESSMENT AND  PLAN / ED COURSE  I reviewed the triage vital signs and the nursing notes.                              Differential diagnosis includes, but is not limited to, hip fracture, ankle fracture, metatarsal fracture, calcaneus fracture, cardiac arrhythmia, acute cystitis.  Patient's presentation is most consistent with acute illness / injury with system symptoms.  The patient is on the cardiac monitor to evaluate for evidence of arrhythmia and/or significant heart rate changes.  88 year old female presenting to the emergency department for treatment and evaluation of shortness of breath, acute weakness after fall 5 days ago, and right lower extremity pain.  Clinical Course as of 01/09/22 1852  Mon Jan 09, 2022  1601 I have attempted to call the son who is listed as her contact person.  No answer with the exception of the voicemail.  There is no identification to whom the voicemail would go to therefore no message was left. [CT]  1618 Upon reassessment, th patient now reports that she had a mechanical fall on Wednesday (5 days ago) where she caught her right foot in a chair and fell. Since then, she has had pain in the right foot and ankle. She denies loss of consciousness and has had no subsequent headache or vision changes. She feels that her foot "must be fractured" because it hurts to bear weight. She is also now complaining of right hip pain that started soon after the fall as well. Will get imaging of the right hip. [CT]  67 Daughter-in-law (caregiver) at bedside.  She states that since her fall on Wednesday, she has progressively gotten weaker and has been unable to tolerate any weightbearing.  They applied a cam walker to see if that would help however she was still not able to walk.  Daughter-in-law states that patient can go home if she is not able to ambulate.  Will discuss with ED attending and hospitalist. [CT]    Clinical Course User Index [CT] Equan Cogbill B, FNP     FINAL  CLINICAL IMPRESSION(S) / ED DIAGNOSES   Final diagnoses:  Weakness  Pain of right lower extremity  Unable to ambulate     Rx / DC Orders   ED Discharge Orders     None        Note:  This document was prepared using Dragon voice recognition software and may include unintentional dictation errors.   Rhanda Lemire,  Dessa Phi, FNP 01/09/22 2591    Arta Silence, MD 01/10/22 0010

## 2022-01-09 NOTE — Assessment & Plan Note (Addendum)
Worsening generalized weakness which can be secondary to aging.  B12 levels borderline low at 196 and she was started on supplement. Also positive orthostatic vitals-need encouragement with p.o. intake. All imaging negative for any acute abnormality. PT/OT are recommending SNF. -TOC consult for placement

## 2022-01-09 NOTE — Assessment & Plan Note (Addendum)
-   Bupropion 150 mg in AM, sertraline 50 mg daily, resumed

## 2022-01-09 NOTE — Assessment & Plan Note (Signed)
-   Apixaban 2.5 mg PO BID resumed

## 2022-01-09 NOTE — Assessment & Plan Note (Signed)
PPI ?

## 2022-01-10 DIAGNOSIS — I951 Orthostatic hypotension: Secondary | ICD-10-CM | POA: Diagnosis present

## 2022-01-10 DIAGNOSIS — R918 Other nonspecific abnormal finding of lung field: Secondary | ICD-10-CM | POA: Diagnosis not present

## 2022-01-10 DIAGNOSIS — Z7901 Long term (current) use of anticoagulants: Secondary | ICD-10-CM | POA: Diagnosis not present

## 2022-01-10 DIAGNOSIS — I480 Paroxysmal atrial fibrillation: Secondary | ICD-10-CM | POA: Diagnosis present

## 2022-01-10 DIAGNOSIS — T502X5A Adverse effect of carbonic-anhydrase inhibitors, benzothiadiazides and other diuretics, initial encounter: Secondary | ICD-10-CM | POA: Diagnosis not present

## 2022-01-10 DIAGNOSIS — N179 Acute kidney failure, unspecified: Secondary | ICD-10-CM | POA: Diagnosis not present

## 2022-01-10 DIAGNOSIS — R0609 Other forms of dyspnea: Secondary | ICD-10-CM | POA: Diagnosis present

## 2022-01-10 DIAGNOSIS — Z1152 Encounter for screening for COVID-19: Secondary | ICD-10-CM | POA: Diagnosis not present

## 2022-01-10 DIAGNOSIS — Z8249 Family history of ischemic heart disease and other diseases of the circulatory system: Secondary | ICD-10-CM | POA: Diagnosis not present

## 2022-01-10 DIAGNOSIS — Z87891 Personal history of nicotine dependence: Secondary | ICD-10-CM | POA: Diagnosis not present

## 2022-01-10 DIAGNOSIS — G629 Polyneuropathy, unspecified: Secondary | ICD-10-CM | POA: Diagnosis present

## 2022-01-10 DIAGNOSIS — R296 Repeated falls: Secondary | ICD-10-CM | POA: Diagnosis present

## 2022-01-10 DIAGNOSIS — K219 Gastro-esophageal reflux disease without esophagitis: Secondary | ICD-10-CM | POA: Diagnosis present

## 2022-01-10 DIAGNOSIS — I5032 Chronic diastolic (congestive) heart failure: Secondary | ICD-10-CM | POA: Diagnosis present

## 2022-01-10 DIAGNOSIS — E860 Dehydration: Secondary | ICD-10-CM | POA: Diagnosis present

## 2022-01-10 DIAGNOSIS — R600 Localized edema: Secondary | ICD-10-CM | POA: Diagnosis present

## 2022-01-10 DIAGNOSIS — I5031 Acute diastolic (congestive) heart failure: Secondary | ICD-10-CM | POA: Diagnosis not present

## 2022-01-10 DIAGNOSIS — I10 Essential (primary) hypertension: Secondary | ICD-10-CM | POA: Diagnosis not present

## 2022-01-10 DIAGNOSIS — Z85038 Personal history of other malignant neoplasm of large intestine: Secondary | ICD-10-CM | POA: Diagnosis not present

## 2022-01-10 DIAGNOSIS — R54 Age-related physical debility: Secondary | ICD-10-CM | POA: Diagnosis present

## 2022-01-10 DIAGNOSIS — I110 Hypertensive heart disease with heart failure: Secondary | ICD-10-CM | POA: Diagnosis present

## 2022-01-10 DIAGNOSIS — M79604 Pain in right leg: Secondary | ICD-10-CM | POA: Diagnosis not present

## 2022-01-10 DIAGNOSIS — J9811 Atelectasis: Secondary | ICD-10-CM | POA: Diagnosis not present

## 2022-01-10 DIAGNOSIS — M722 Plantar fascial fibromatosis: Secondary | ICD-10-CM | POA: Diagnosis present

## 2022-01-10 DIAGNOSIS — E785 Hyperlipidemia, unspecified: Secondary | ICD-10-CM | POA: Diagnosis present

## 2022-01-10 DIAGNOSIS — F32A Depression, unspecified: Secondary | ICD-10-CM | POA: Diagnosis present

## 2022-01-10 DIAGNOSIS — E538 Deficiency of other specified B group vitamins: Secondary | ICD-10-CM | POA: Diagnosis present

## 2022-01-10 DIAGNOSIS — M533 Sacrococcygeal disorders, not elsewhere classified: Secondary | ICD-10-CM | POA: Diagnosis present

## 2022-01-10 DIAGNOSIS — Z79899 Other long term (current) drug therapy: Secondary | ICD-10-CM | POA: Diagnosis not present

## 2022-01-10 DIAGNOSIS — R531 Weakness: Secondary | ICD-10-CM | POA: Diagnosis present

## 2022-01-10 LAB — BASIC METABOLIC PANEL
Anion gap: 4 — ABNORMAL LOW (ref 5–15)
BUN: 37 mg/dL — ABNORMAL HIGH (ref 8–23)
CO2: 28 mmol/L (ref 22–32)
Calcium: 8.3 mg/dL — ABNORMAL LOW (ref 8.9–10.3)
Chloride: 103 mmol/L (ref 98–111)
Creatinine, Ser: 0.66 mg/dL (ref 0.44–1.00)
GFR, Estimated: >60 mL/min (ref >60)
Glucose, Bld: 103 mg/dL — ABNORMAL HIGH (ref 70–99)
Potassium: 4.6 mmol/L (ref 3.5–5.1)
Sodium: 135 mmol/L (ref 135–145)

## 2022-01-10 LAB — CBC
HCT: 37.3 % (ref 36.0–46.0)
Hemoglobin: 11.8 g/dL — ABNORMAL LOW (ref 12.0–15.0)
MCH: 29.5 pg (ref 26.0–34.0)
MCHC: 31.6 g/dL (ref 30.0–36.0)
MCV: 93.3 fL (ref 80.0–100.0)
Platelets: 182 10*3/uL (ref 150–400)
RBC: 4 MIL/uL (ref 3.87–5.11)
RDW: 12.7 % (ref 11.5–15.5)
WBC: 6.9 10*3/uL (ref 4.0–10.5)
nRBC: 0 % (ref 0.0–0.2)

## 2022-01-10 LAB — PROCALCITONIN: Procalcitonin: <0.1 ng/mL

## 2022-01-10 LAB — VITAMIN B12: Vitamin B-12: 196 pg/mL (ref 180–914)

## 2022-01-10 LAB — MAGNESIUM: Magnesium: 2.2 mg/dL (ref 1.7–2.4)

## 2022-01-10 MED ORDER — BRIMONIDINE TARTRATE-TIMOLOL 0.2-0.5 % OP SOLN
1.0000 [drp] | Freq: Two times a day (BID) | OPHTHALMIC | Status: DC
  Filled 2022-01-10: qty 5

## 2022-01-10 MED ORDER — AMLODIPINE BESYLATE 5 MG PO TABS
2.5000 mg | ORAL_TABLET | Freq: Every day | ORAL | Status: DC
  Administered 2022-01-10: 2.5 mg via ORAL
  Filled 2022-01-10: qty 1

## 2022-01-10 MED ORDER — SODIUM CHLORIDE 0.9 % IV BOLUS
500.0000 mL | Freq: Once | INTRAVENOUS | Status: AC
  Administered 2022-01-10: 500 mL via INTRAVENOUS

## 2022-01-10 MED ORDER — VITAMIN B-12 1000 MCG PO TABS
1000.0000 ug | ORAL_TABLET | Freq: Every day | ORAL | Status: DC
  Administered 2022-01-10 – 2022-01-17 (×8): 1000 ug via ORAL
  Filled 2022-01-10 (×9): qty 1

## 2022-01-10 NOTE — Evaluation (Signed)
Physical Therapy Evaluation Patient Details Name: Caroline Aguilar MRN: [id number] DOB: Mar 12, 1926 Today's Date: 01/10/2022  History of Present Illness  Caroline Aguilar is a 88 year old female with history of hyperlipidemia, depression, anxiety, hypertension, atrial fibrillation, history of stroke, who presents emergency department for chief concerns of multiple falls and weakness.  Clinical Impression  Pt received in Semi-Fowler's position and agreeable to therapy.  Pt is HOH throughout session, but is able to functionally communicate.  Pt is not wanting to go to rehab facility, however understands why it may be best for her.  DIL present in room and is suggesting for pt to go to rehab as well, because the care she receives at home by her son and DIL does not allow for them to pick her up if she falls.  Pt understanding of the situation.  Pt does put forth good effort throughout the session and is able to tolerate a short amount of distance when walking, but is complaining of her R hip and ankle pain when walking.  Pt able to tolerate ambulation to the doorway and back, but is in 8/10 pain upon doing so.  Pt assisted back into the bed with call bell within reach and all other needs met.  Transportation in room to move her to inpatient room.  Current discharge plans to SNF are most appropriate at this time.  Pt will continue to benefit from skilled therapy in order to address deficits listed below.    Recommendations for follow up therapy are one component of a multi-disciplinary discharge planning process, led by the attending physician.  Recommendations may be updated based on patient status, additional functional criteria and insurance authorization.  Follow Up Recommendations Skilled nursing-short term rehab (<3 hours/day) Can patient physically be transported by private vehicle: Yes    Assistance Recommended at Discharge Frequent or constant Supervision/Assistance  Patient can return home  with the following  A lot of help with walking and/or transfers;A lot of help with bathing/dressing/bathroom;Assist for transportation;Help with stairs or ramp for entrance    Equipment Recommendations None recommended by PT  Recommendations for Other Services       Functional Status Assessment Patient has had a recent decline in their functional status and demonstrates the ability to make significant improvements in function in a reasonable and predictable amount of time.     Precautions / Restrictions        Mobility  Bed Mobility Overal bed mobility: Needs Assistance Bed Mobility: Supine to Sit     Supine to sit: Supervision     General bed mobility comments: Pt able to use bed features to come upright.    Transfers Overall transfer level: Needs assistance Equipment used: Rolling walker (2 wheels) Transfers: Sit to/from Stand Sit to Stand: Min guard           General transfer comment: Pt able to come upright with CGA for safety and to prevent falls.    Ambulation/Gait Ambulation/Gait assistance: Min guard Gait Distance (Feet): 10 Feet Assistive device: Rolling walker (2 wheels) Gait Pattern/deviations: Step-to pattern, Decreased weight shift to right, Trunk flexed Gait velocity: decreased     General Gait Details: Pt able to ambulate to the doorway and back, but has significant pain in the R foot and hip with mobility.  Pt unable to have proper foot clearance during swing through and just slides her foot forward.  Stairs            Wheelchair Mobility    Modified  Rankin (Stroke Patients Only)       Balance Overall balance assessment: Needs assistance Sitting-balance support: No upper extremity supported, Feet unsupported Sitting balance-Leahy Scale: Fair     Standing balance support: Bilateral upper extremity supported, During functional activity, Reliant on assistive device for balance Standing balance-Leahy Scale: Fair                                Pertinent Vitals/Pain Pain Assessment Pain Assessment: 0-10 Pain Score: 8  Pain Location: R hip and foot Pain Descriptors / Indicators: Sharp Pain Intervention(s): Limited activity within patient's tolerance    Home Living Family/patient expects to be discharged to:: Private residence Living Arrangements: Alone Available Help at Discharge: Family;Available 24 hours/day Type of Home: House Home Access: Stairs to enter Entrance Stairs-Rails: Right Entrance Stairs-Number of Steps: 4   Home Layout: One level Home Equipment: Conservation officer, nature (2 wheels);Cane - single point;Tub bench;Grab bars - toilet Additional Comments: Lift Chair    Prior Function Prior Level of Function : Needs assist       Physical Assist : ADLs (physical)             Hand Dominance   Dominant Hand: Right    Extremity/Trunk Assessment   Upper Extremity Assessment Upper Extremity Assessment: Generalized weakness    Lower Extremity Assessment Lower Extremity Assessment: Generalized weakness       Communication   Communication: HOH  Cognition Arousal/Alertness: Awake/alert Behavior During Therapy: WFL for tasks assessed/performed Overall Cognitive Status: Within Functional Limits for tasks assessed                                          General Comments      Exercises     Assessment/Plan    PT Assessment Patient needs continued PT services  PT Problem List Decreased strength;Decreased activity tolerance;Decreased balance;Decreased mobility;Decreased knowledge of use of DME;Decreased safety awareness;Pain       PT Treatment Interventions DME instruction;Gait training;Stair training;Functional mobility training;Therapeutic activities;Therapeutic exercise;Balance training;Neuromuscular re-education;Patient/family education    PT Goals (Current goals can be found in the Care Plan section)  Acute Rehab PT Goals Patient Stated Goal: to stop the pain  in her legs or figure out what's causing it. PT Goal Formulation: With patient/family Time For Goal Achievement: 01/24/22 Potential to Achieve Goals: Fair    Frequency Min 2X/week     Co-evaluation               AM-PAC PT "6 Clicks" Mobility  Outcome Measure Help needed turning from your back to your side while in a flat bed without using bedrails?: A Little Help needed moving from lying on your back to sitting on the side of a flat bed without using bedrails?: A Little Help needed moving to and from a bed to a chair (including a wheelchair)?: A Lot Help needed standing up from a chair using your arms (e.g., wheelchair or bedside chair)?: A Little Help needed to walk in hospital room?: A Lot Help needed climbing 3-5 steps with a railing? : A Lot 6 Click Score: 15    End of Session Equipment Utilized During Treatment: Gait belt Activity Tolerance: Patient limited by pain Patient left: in bed;with call bell/phone within reach;with nursing/sitter in room;with family/visitor present Nurse Communication: Mobility status PT Visit Diagnosis: Unsteadiness on feet (R26.81);Other  abnormalities of gait and mobility (R26.89);Repeated falls (R29.6);Muscle weakness (generalized) (M62.81);History of falling (Z91.81);Difficulty in walking, not elsewhere classified (R26.2);Pain Pain - Right/Left: Right Pain - part of body: Hip;Leg    Time: 1241-1311 PT Time Calculation (min) (ACUTE ONLY): 30 min   Charges:   PT Evaluation $PT Eval Low Complexity: 1 Low PT Treatments $Therapeutic Activity: 8-22 mins        Gwenlyn Saran, PT, DPT 01/10/22, 2:23 PM

## 2022-01-10 NOTE — Evaluation (Signed)
Occupational Therapy Evaluation Patient Details Name: Caroline Aguilar MRN: [id number] DOB: 30-Sep-1925 Today's Date: 01/10/2022   History of Present Illness Ms. Brandilee Pember is a 88 year old female with history of hyperlipidemia, depression, anxiety, hypertension, atrial fibrillation, history of stroke, who presents emergency department for chief concerns of multiple falls and weakness.   Clinical Impression   Ms Ekdahl was seen for OT evaluation this date. Prior to hospital admission, pt was MOD I for mobility and PRN assist for ADLs. Pt lives with family. Pt presents to acute OT demonstrating impaired ADL performance and functional mobility 2/2 decreased activity tolerance and functional strength/ROM/balance deficits. Pt currently requires MIN A exit bed, MIN A + RW sit<>stand x3 and side steps along EOB. Pt has multiple uncontrolled descents onto bed, MOD A to sit safely. Positive orthostatics noted. Pt would benefit from skilled OT to address noted impairments and functional limitations (see below for any additional details). Upon hospital discharge, recommend STR to maximize pt safety and return to PLOF.   Orthostatic Vitals  Lying: BP 112/61, MAP 73, HR 71 Sitting: BP 113/71, MAP  85, HR 75 Standing: BP 94/59, MAP 67, HR 75    Recommendations for follow up therapy are one component of a multi-disciplinary discharge planning process, led by the attending physician.  Recommendations may be updated based on patient status, additional functional criteria and insurance authorization.   Follow Up Recommendations  Skilled nursing-short term rehab (<3 hours/day)    Assistance Recommended at Discharge Frequent or constant Supervision/Assistance  Patient can return home with the following A lot of help with walking and/or transfers;A lot of help with bathing/dressing/bathroom    Functional Status Assessment  Patient has had a recent decline in their functional status and demonstrates the  ability to make significant improvements in function in a reasonable and predictable amount of time.  Equipment Recommendations  BSC/3in1    Recommendations for Other Services       Precautions / Restrictions Precautions Precautions: Fall Restrictions Weight Bearing Restrictions: No      Mobility Bed Mobility Overal bed mobility: Needs Assistance Bed Mobility: Supine to Sit, Sit to Supine, Rolling Rolling: Min assist   Supine to sit: Min assist Sit to supine: Min guard        Transfers Overall transfer level: Needs assistance Equipment used: Rolling walker (2 wheels) Transfers: Sit to/from Stand Sit to Stand: Min guard, From elevated surface           General transfer comment: MOD A to control descent      Balance Overall balance assessment: Needs assistance Sitting-balance support: No upper extremity supported, Feet unsupported Sitting balance-Leahy Scale: Fair     Standing balance support: Bilateral upper extremity supported, Reliant on assistive device for balance Standing balance-Leahy Scale: Poor                             ADL either performed or assessed with clinical judgement   ADL Overall ADL's : Needs assistance/impaired                                       General ADL Comments: MIN A + RW for simulated BSC t/f. MAX A don B socks seated EOB. SETUP seated grooming, requires BUE support static standing with poor tolerance      Pertinent Vitals/Pain Pain Assessment Pain Assessment: Faces Faces Pain  Scale: Hurts little more Pain Location: R hip and foot Pain Descriptors / Indicators: Sharp Pain Intervention(s): Limited activity within patient's tolerance, Repositioned     Hand Dominance Right   Extremity/Trunk Assessment Upper Extremity Assessment Upper Extremity Assessment: Generalized weakness   Lower Extremity Assessment Lower Extremity Assessment: Generalized weakness       Communication  Communication Communication: HOH   Cognition Arousal/Alertness: Awake/alert Behavior During Therapy: WFL for tasks assessed/performed Overall Cognitive Status: Within Functional Limits for tasks assessed                                        Home Living Family/patient expects to be discharged to:: Private residence Living Arrangements: Alone Available Help at Discharge: Family;Available 24 hours/day Type of Home: House Home Access: Stairs to enter [employer] of Steps: 4 Entrance Stairs-Rails: Right Home Layout: One level     Bathroom Shower/Tub: Teacher, early years/pre: Handicapped height     Home Equipment: Conservation officer, nature (2 wheels);Cane - single point;Tub bench;Grab bars - toilet   Additional Comments: Lift Chair      Prior Functioning/Environment Prior Level of Function : Needs assist       Physical Assist : ADLs (physical)                OT Problem List: Decreased strength;Decreased range of motion;Decreased activity tolerance;Impaired balance (sitting and/or standing)      OT Treatment/Interventions: Self-care/ADL training;Therapeutic exercise;Energy conservation;DME and/or AE instruction;Therapeutic activities;Balance training;Patient/family education    OT Goals(Current goals can be found in the care plan section) Acute Rehab OT Goals Patient Stated Goal: to walk OT Goal Formulation: With patient Time For Goal Achievement: 01/24/22 Potential to Achieve Goals: Good ADL Goals Pt Will Perform Grooming: with supervision;standing Pt Will Perform Lower Body Dressing: with min assist;sit to/from stand Pt Will Transfer to Toilet: with min guard assist;ambulating;bedside commode  OT Frequency: Min 2X/week    Co-evaluation              AM-PAC OT "6 Clicks" Daily Activity     Outcome Measure Help from another person eating meals?: None Help from another person taking care of personal grooming?: A Little Help  from another person toileting, which includes using toliet, bedpan, or urinal?: A Lot Help from another person bathing (including washing, rinsing, drying)?: A Lot Help from another person to put on and taking off regular upper body clothing?: A Little Help from another person to put on and taking off regular lower body clothing?: A Lot 6 Click Score: 16   End of Session    Activity Tolerance: Patient tolerated treatment well Patient left: in bed;with call bell/phone within reach;with bed alarm set  OT Visit Diagnosis: Other abnormalities of gait and mobility (R26.89);Muscle weakness (generalized) (M62.81)                Time: 1308-6578 OT Time Calculation (min): 20 min Charges:  OT General Charges $OT Visit: 1 Visit OT Evaluation $OT Eval Moderate Complexity: 1 Mod OT Treatments $Self Care/Home Management : 8-22 mins  Dessie Coma, M.S. OTR/L  01/10/22, 4:10 PM  ascom [phone]

## 2022-01-10 NOTE — ED Notes (Signed)
PT at bedside with pt at this time

## 2022-01-10 NOTE — Progress Notes (Signed)
       CROSS COVER NOTE  NAME: Caroline Aguilar MRN: [id number] DOB : 1926/02/04    Date of Service   01/10/2022   HPI/Events of Note   Notified of BP 92/46. Patient presented to [facility] for frequent falls, recently she was attempting to get up from a sitting position and became suddenly weak and fell. There may be an element of orthostasis at play, will give small fluid bolus.  Interventions   Assessment/Plan: 500 mL NS Monitor respiratory status, BNP elevated on admission Orthostatic Vital signs daily      This document was prepared using Dragon voice recognition software and may include unintentional dictation errors.  Neomia Glass DNP, MHA, FNP-BC Nurse Practitioner [facility] Pager [phone]

## 2022-01-10 NOTE — ED Notes (Signed)
This RN to bedside. Pt is awake and alert, family member at bedside. Respirations are even and unlabored, skin AfE/WD. VSS. Pt denies needs at this time. Pt placed in high fowlers position to eat breakfast, meal tray delivered by dietary at this time. Call light placed in reach, WCTM.

## 2022-01-10 NOTE — Progress Notes (Signed)
Progress Note   Patient: Caroline Aguilar SJG:[id number] DOB: 08/30/1925 DOA: 01/09/2022     0 DOS: the patient was seen and examined on 01/10/2022   Brief hospital course: Ms. Jayli Fogleman is a 88 year old female with history of hyperlipidemia, depression, anxiety, hypertension, atrial fibrillation, history of stroke, who presents emergency department for chief concerns of multiple falls and weakness, most of the symptoms are related when she was trying to stand up quickly from sitting position.  Also endorsed poor p.o. intake.  Having difficulty with performing her ADLs.  ROS positive for exertional dyspnea for about a year.  She lives alone, son and daughter-in-law who lives across the street take care of her.  Initial vitals in the ED showed temperature of 98, respiration rate of 19, heart rate of 65, blood pressure 96/55, SPO2 of 94% on room air.  Serum sodium is 134, potassium 4.9, chloride 100, bicarb 28, BUN of 41, serum creatinine 0.87, GFR greater than 60, nonfasting blood glucose 133, WBC 6.9, hemoglobin 12.6, platelets of 187.  COVID/influenza A/influenza B PCR were negative. UA was negative for leukocytes and nitrates.  BNP was 127.8  Portable chest x-ray, right ankle x-ray, right foot complete x-ray, right hip x-ray: Was read as negative for fracture.  ED treatment: None  9/26: Patient again became weak and dizzy while getting out of bed, blood pressure dropped to 92/46 and she received 500 cc of bolus. Patient was only taking 2.5 mg of amlodipine daily per daughter.  Most likely dehydration secondary to poor p.o. intake. Orthostatic vital seems positive. PT/OT are recommending SNF-patient and family agrees. B12 at 196-starting supplement.   Assessment and Plan: * Frequent falls Worsening generalized weakness which can be secondary to aging.  B12 levels borderline low at 196 and she was started on supplement. Also positive orthostatic vitals-need encouragement with p.o.  intake. All imaging negative for any acute abnormality. PT/OT are recommending SNF. -TOC consult for placement  AF (paroxysmal atrial fibrillation) (HCC) - Apixaban 2.5 mg PO BID resumed  HTN (hypertension) Blood pressure elevated.  Apparently taking 2.5 mg of amlodipine at home. -Re start home amlodipine 2.5 mg daily - Hydralazine 10 mg p.o. every 6 hours as needed for SBP greater than 180, 4 days ordered -Continue to monitor  Depression - Bupropion 150 mg in AM, sertraline 50 mg daily, resumed  HLD (hyperlipidemia) - Simvastatin 20 mg daily resumed  GERD (gastroesophageal reflux disease) - PPI   Subjective: Patient was complaining of right foot pain and worsening weakness which has been going on for a while.  Physical Exam: Vitals:   01/10/22 1030 01/10/22 1100 01/10/22 1200 01/10/22 1343  BP: (!) 142/83 (!) 163/70  (!) 182/74  Pulse: 73 71 78 72  Resp: 18 (!) '30 17 18  '$ Temp:    98.1 F (36.7 C)  TempSrc:    Oral  SpO2: 100% 100% 95% 96%  Weight:      Height:       General.  Frail elderly lady, in no acute distress. Pulmonary.  Lungs clear bilaterally, normal respiratory effort. CV.  Regular rate and rhythm, no JVD, rub or murmur. Abdomen.  Soft, nontender, nondistended, BS positive. CNS.  Alert and oriented .  No focal neurologic deficit. Extremities.  No edema,  pulses intact and symmetrical.  Left lower leg lacerations with clean bandage. Psychiatry.  Appears to have some cognitive deficit.  Data Reviewed: Prior data reviewed  Family Communication: Discussed with DIL at bedside  Disposition: Status is:  Inpatient Remains inpatient appropriate because: Severity of illness, patient need rehab placement   Planned Discharge Destination: Skilled nursing facility  DVT prophylaxis.  Eliquis Time spent: 45 minutes  This record has been created using Systems analyst. Errors have been sought and corrected,but may not always be located. Such  creation errors do not reflect on the standard of care.  Author: Lorella Nimrod, MD 01/10/2022 1:57 PM  For on call review [email or web address].

## 2022-01-11 ENCOUNTER — Inpatient Hospital Stay
Admit: 2022-01-11 | Discharge: 2022-01-11 | Disposition: A | Discharge status: Home / Self Care | Payer: Medicare HMO | Attending: Internal Medicine | Admitting: Internal Medicine

## 2022-01-11 DIAGNOSIS — I5032 Chronic diastolic (congestive) heart failure: Secondary | ICD-10-CM

## 2022-01-11 DIAGNOSIS — E538 Deficiency of other specified B group vitamins: Secondary | ICD-10-CM

## 2022-01-11 DIAGNOSIS — R296 Repeated falls: Secondary | ICD-10-CM | POA: Diagnosis not present

## 2022-01-11 DIAGNOSIS — K219 Gastro-esophageal reflux disease without esophagitis: Secondary | ICD-10-CM

## 2022-01-11 DIAGNOSIS — I480 Paroxysmal atrial fibrillation: Secondary | ICD-10-CM

## 2022-01-11 DIAGNOSIS — I10 Essential (primary) hypertension: Secondary | ICD-10-CM

## 2022-01-11 DIAGNOSIS — M533 Sacrococcygeal disorders, not elsewhere classified: Secondary | ICD-10-CM

## 2022-01-11 DIAGNOSIS — E785 Hyperlipidemia, unspecified: Secondary | ICD-10-CM

## 2022-01-11 DIAGNOSIS — F32A Depression, unspecified: Secondary | ICD-10-CM

## 2022-01-11 MED ORDER — AMLODIPINE BESYLATE 5 MG PO TABS
2.5000 mg | ORAL_TABLET | Freq: Every day | ORAL | Status: DC
  Administered 2022-01-11 – 2022-01-13 (×3): 2.5 mg via ORAL
  Filled 2022-01-11 (×3): qty 1

## 2022-01-11 MED ORDER — BRIMONIDINE TARTRATE 0.2 % OP SOLN
1.0000 [drp] | Freq: Two times a day (BID) | OPHTHALMIC | Status: DC
  Administered 2022-01-11 – 2022-01-17 (×12): 1 [drp] via OPHTHALMIC
  Filled 2022-01-11: qty 5

## 2022-01-11 MED ORDER — TIMOLOL MALEATE 0.5 % OP SOLN
1.0000 [drp] | Freq: Two times a day (BID) | OPHTHALMIC | Status: DC
  Administered 2022-01-11 – 2022-01-17 (×11): 1 [drp] via OPHTHALMIC
  Filled 2022-01-11: qty 5

## 2022-01-11 MED ORDER — FUROSEMIDE 20 MG PO TABS
20.0000 mg | ORAL_TABLET | Freq: Every day | ORAL | Status: DC
  Administered 2022-01-11: 20 mg via ORAL
  Filled 2022-01-11: qty 1

## 2022-01-11 MED ORDER — FUROSEMIDE 20 MG PO TABS
20.0000 mg | ORAL_TABLET | Freq: Two times a day (BID) | ORAL | Status: DC
  Administered 2022-01-11: 20 mg via ORAL
  Filled 2022-01-11: qty 1

## 2022-01-11 MED ORDER — NAPROXEN 250 MG PO TABS
250.0000 mg | ORAL_TABLET | Freq: Two times a day (BID) | ORAL | Status: DC
  Administered 2022-01-11: 250 mg via ORAL
  Filled 2022-01-11 (×2): qty 1

## 2022-01-11 NOTE — Assessment & Plan Note (Addendum)
Ruled out ?

## 2022-01-11 NOTE — Progress Notes (Signed)
Progress Note   Patient: Caroline Aguilar MVH:[id number] DOB: 21-Jan-1926 DOA: 01/09/2022     1 DOS: the patient was seen and examined on 01/11/2022   Brief hospital course: Ms. Caroline Aguilar is a 88 year old female with history of hyperlipidemia, depression, anxiety, hypertension, atrial fibrillation, history of stroke, who presents emergency department for chief concerns of multiple falls and weakness, most of the symptoms are related when she was trying to stand up quickly from sitting position.  Also endorsed poor p.o. intake.  Having difficulty with performing her ADLs.  ROS positive for exertional dyspnea for about a year.  She lives alone, son and daughter-in-law who lives across the street take care of her.  Initial vitals in the ED showed temperature of 98, respiration rate of 19, heart rate of 65, blood pressure 96/55, SPO2 of 94% on room air.  Serum sodium is 134, potassium 4.9, chloride 100, bicarb 28, BUN of 41, serum creatinine 0.87, GFR greater than 60, nonfasting blood glucose 133, WBC 6.9, hemoglobin 12.6, platelets of 187.  COVID/influenza A/influenza B PCR were negative. UA was negative for leukocytes and nitrates.  BNP was 127.8  Portable chest x-ray, right ankle x-ray, right foot complete x-ray, right hip x-ray: Was read as negative for fracture.  ED treatment: None  9/26: Patient again became weak and dizzy while getting out of bed, blood pressure dropped to 92/46 and she received 500 cc of bolus. Patient was only taking 2.5 mg of amlodipine daily per daughter.  Most likely dehydration secondary to poor p.o. intake. Orthostatic vital seems positive. PT/OT are recommending SNF-patient and family agrees. B12 at 196-starting supplement.  Assessment and Plan: * Pain of right sacroiliac joint Patient gets hallucinations with steroids.  Did have an allergy to Mobic but this is unknown.  We will give a trial of Naprosyn low-dose.  Frequent falls Patient was slightly  orthostatic yesterday.  Blood pressure now on the higher side.  Continue low-dose Norvasc for now.  Chronic diastolic CHF (congestive heart failure) (HCC) I will give 20 mg of oral Lasix twice a day today and reassess tomorrow.  Ordered echocardiogram.  Heart rate on the lower side so holding off on beta-blocker  AF (paroxysmal atrial fibrillation) (HCC)  Apixaban 2.5 mg PO BID resumed.  Benefits and risks of blood thinner explained to patient and family especially with frequent falls.  HTN (hypertension) Was orthostatic yesterday.  Continue to monitor closely.  Blood pressure on the higher side today.  Restarted Norvasc  Depression - Bupropion 150 mg in AM, sertraline 50 mg daily  HLD (hyperlipidemia) - Simvastatin 20 mg daily   GERD (gastroesophageal reflux disease) - PPI  Vitamin B12 deficiency Continue supplementation        Subjective: Patient having pain in her right back and unable to bear weight.  Patient having a lot of pain.  She has been having some frequent falls.  Physical Exam: Vitals:   01/10/22 2026 01/11/22 0522 01/11/22 0522 01/11/22 0859  BP: (!) 159/80 122/75 122/75 (!) 169/75  Pulse: 65 (!) 58 66 60  Resp: 18   18  Temp: 98.4 F (36.9 C) 97.9 F (36.6 C) 97.9 F (36.6 C) 98 F (36.7 C)  TempSrc: Oral Oral Oral   SpO2: 99% 93% 96% 94%  Weight:      Height:       Physical Exam HENT:     Head: Normocephalic.     Mouth/Throat:     Pharynx: No oropharyngeal exudate.  Eyes:  General: Lids are normal.     Conjunctiva/sclera: Conjunctivae normal.  Cardiovascular:     Rate and Rhythm: Normal rate and regular rhythm.     Heart sounds: Normal heart sounds, S1 normal and S2 normal.  Pulmonary:     Breath sounds: No decreased breath sounds, wheezing, rhonchi or rales.  Abdominal:     Palpations: Abdomen is soft.     Tenderness: There is no abdominal tenderness.  Musculoskeletal:     Right lower leg: No swelling.     Left lower leg: No  swelling.     Comments: Right sacroiliac pain to palpation.  Skin:    General: Skin is warm.     Findings: No rash.  Neurological:     Mental Status: She is alert and oriented to person, place, and time.     Data Reviewed: Creatinine 0.66, sodium 135, procalcitonin negative, hemoglobin 11.8  Family Communication: Spoke with family at the bedside  Disposition: Status is: Inpatient Remains inpatient appropriate because: Treating sacroiliac pain and CHF.  Planned Discharge Destination: Rehab    Time spent: 28 minutes  Author: [name], MD 01/11/2022 2:18 PM  For on call review [email or web address].

## 2022-01-11 NOTE — Progress Notes (Signed)
Occupational Therapy Treatment Patient Details Name: Caroline Aguilar MRN: [id number] DOB: 1925-06-13 Today's Date: 01/11/2022   History of present illness Ms. Caroline Aguilar is a 88 year old female with history of hyperlipidemia, depression, anxiety, hypertension, atrial fibrillation, history of stroke, who presents emergency department for chief concerns of multiple falls and weakness.   OT comments  Ms Schrade was seen for OT treatment on this date. Upon arrival to room pt reclined in chair, agreeable to tx. Pt requires SUPERVISION bed mobility. MIN A + RW sit<>stand and bed>chair SPT. Educated on falls safety. Pt making good progress toward goals, will continue to follow POC. Discharge recommendation remains appropriate.     Recommendations for follow up therapy are one component of a multi-disciplinary discharge planning process, led by the attending physician.  Recommendations may be updated based on patient status, additional functional criteria and insurance authorization.    Follow Up Recommendations  Skilled nursing-short term rehab (<3 hours/day)    Assistance Recommended at Discharge Frequent or constant Supervision/Assistance  Patient can return home with the following  A lot of help with walking and/or transfers;A lot of help with bathing/dressing/bathroom   Equipment Recommendations  BSC/3in1    Recommendations for Other Services      Precautions / Restrictions Precautions Precautions: Fall Restrictions Weight Bearing Restrictions: No       Mobility Bed Mobility Overal bed mobility: Needs Assistance Bed Mobility: Supine to Sit     Supine to sit: Min guard          Transfers Overall transfer level: Needs assistance Equipment used: Rolling walker (2 wheels) Transfers: Sit to/from Stand, Bed to chair/wheelchair/BSC Sit to Stand: Min assist     Step pivot transfers: Min assist           Balance Overall balance assessment: Needs  assistance Sitting-balance support: No upper extremity supported, Feet unsupported Sitting balance-Leahy Scale: Fair     Standing balance support: Bilateral upper extremity supported Standing balance-Leahy Scale: Poor                             ADL either performed or assessed with clinical judgement   ADL Overall ADL's : Needs assistance/impaired                                       General ADL Comments: MIN A + RW for simulated BSC t/f. MAX A don B socks seated EOB.      Cognition Arousal/Alertness: Awake/alert Behavior During Therapy: WFL for tasks assessed/performed Overall Cognitive Status: Within Functional Limits for tasks assessed                                                     Pertinent Vitals/ Pain       Pain Assessment Pain Assessment: No/denies pain   Frequency  Min 2X/week        Progress Toward Goals  OT Goals(current goals can now be found in the care plan section)  Progress towards OT goals: Progressing toward goals  Acute Rehab OT Goals Patient Stated Goal: to go home OT Goal Formulation: With patient Time For Goal Achievement: 01/24/22 Potential to Achieve Goals: Good ADL Goals Pt Will Perform Grooming: with supervision;standing  Pt Will Perform Lower Body Dressing: with min assist;sit to/from stand Pt Will Transfer to Toilet: with min guard assist;ambulating;bedside commode  Plan Discharge plan remains appropriate;Frequency remains appropriate    Co-evaluation                 AM-PAC OT "6 Clicks" Daily Activity     Outcome Measure   Help from another person eating meals?: None Help from another person taking care of personal grooming?: A Little Help from another person toileting, which includes using toliet, bedpan, or urinal?: A Lot Help from another person bathing (including washing, rinsing, drying)?: A Lot Help from another person to put on and taking off regular upper  body clothing?: A Little Help from another person to put on and taking off regular lower body clothing?: A Lot 6 Click Score: 16    End of Session    OT Visit Diagnosis: Other abnormalities of gait and mobility (R26.89);Muscle weakness (generalized) (M62.81)   Activity Tolerance Patient tolerated treatment well   Patient Left in chair;with call bell/phone within reach   Nurse Communication          Time: 9470-9628 OT Time Calculation (min): 12 min  Charges: OT General Charges $OT Visit: 1 Visit OT Treatments $Self Care/Home Management : 8-22 mins  Dessie Coma, M.S. OTR/L  01/11/22, 4:27 PM  ascom [phone]

## 2022-01-11 NOTE — Assessment & Plan Note (Addendum)
Simvastatin 20 mg daily

## 2022-01-11 NOTE — Assessment & Plan Note (Addendum)
Bupropion 150 mg in AM, sertraline 50 mg daily

## 2022-01-11 NOTE — Assessment & Plan Note (Addendum)
Patient with orthostatic hypotension.  Would like blood pressure on the higher side rather than too low.  Discontinued Norvasc.

## 2022-01-11 NOTE — Progress Notes (Signed)
Patient is alert and oriented but forgetful at times. Daughter in law stated that at bedtime, patient's mood seems to get worse. However, no issues throughout the night. Patient refused eye drops stating that they would take their own. Offered to send the meds to pharmacy for verification but patient refused stating that the last time she did that, they got lost. Daughter-in-law seems to be patient's caregiver. Patient denied pain but reported that sometimes she does have chronic sciatica pain-had neurontin scheduled and placed lidocaine patch on R-dorsogluteal area. Was asleep on rounds while daughter-in-law sat in chair awake. Denied additional needs.

## 2022-01-11 NOTE — Assessment & Plan Note (Signed)
Continue supplementation  ?

## 2022-01-11 NOTE — TOC Progression Note (Signed)
Transition of Care [facility]) - Progression Note    Patient Details  Name: Caroline Aguilar MRN: [id number] Date of Birth: 1926/01/14  [facility]) CM/SW Contact  Beverly Sessions, RN Phone Number: 01/11/2022, 2:54 PM  Clinical Narrative:      Received return call from son.  He accepts bed at [facility].  Auth started in navi portal       Expected Discharge Plan and Services                                                 Social Determinants of Health (SDOH) Interventions    Readmission Risk Interventions     No data to display

## 2022-01-11 NOTE — Assessment & Plan Note (Addendum)
-   Continue Eliquis 

## 2022-01-11 NOTE — NC FL2 (Signed)
[place] LEVEL OF CARE SCREENING TOOL     IDENTIFICATION  Patient Name: Caroline Aguilar Birthdate: November 18, 1925 Sex: female Admission Date (Current Location): 01/09/2022  [facility] and [place] Number:  Engineering geologist and Address:         Provider Number: [id number]  Attending Physician Name and Address:  [name], MD  Relative Name and Phone Number:       Current Level of Care: Hospital Recommended Level of Care: [place] Prior Approval Number:    Date Approved/Denied:   PASRR Number: [id number]  Discharge Plan: SNF    Current Diagnoses: Patient Active Problem List   Diagnosis Date Noted   Frequent falls 01/09/2022   Depression 01/11/2021   Compression fracture of spine ([place]) 01/11/2021   HTN (hypertension) 01/11/2021   Contusion of face 01/31/2020   Stroke ([place]) 06/11/2017   HLD (hyperlipidemia) 06/11/2017   GERD (gastroesophageal reflux disease) 06/11/2017   (HFpEF) heart failure with preserved ejection fraction ([place]) 06/11/2017   Dyspnea 07/15/2015   AF (paroxysmal atrial fibrillation) ([place]) 07/02/2015    Orientation RESPIRATION BLADDER Height & Weight     Self, Time, Situation, Place  Normal Continent Weight: 65.8 kg Height:  5' (152.4 cm)  BEHAVIORAL SYMPTOMS/MOOD NEUROLOGICAL BOWEL NUTRITION STATUS      Continent Diet (Heart Healthy)  AMBULATORY STATUS COMMUNICATION OF NEEDS Skin   Limited Assist Verbally Skin abrasions                       Personal Care Assistance Level of Assistance              Functional Limitations Info             SPECIAL CARE FACTORS FREQUENCY  PT (By licensed PT), OT (By licensed OT)                    Contractures Contractures Info: Not present    Additional Factors Info  Code Status, Allergies Code Status Info: Full Allergies Info: Penicillins, Penicillins, Meloxicam, Azithromycin, Cortisone, Famciclovir, Morphine, Streptomycin, Tramadol,  Venlafaxine           Current Medications (01/11/2022):  This is the current hospital active medication list Current Facility-Administered Medications  Medication Dose Route Frequency Provider Last Rate Last Admin   acetaminophen (TYLENOL) tablet 650 mg  650 mg Oral Q6H PRN Cox, Amy N, DO       Or   acetaminophen (TYLENOL) suppository 650 mg  650 mg Rectal Q6H PRN Cox, Amy N, DO       apixaban (ELIQUIS) tablet 2.5 mg  2.5 mg Oral BID Cox, Amy N, DO   2.5 mg at 01/11/22 0900   brimonidine (ALPHAGAN) 0.2 % ophthalmic solution 1 drop  1 drop Both Eyes BID Wieting, Richard, MD       And   timolol (TIMOPTIC) 0.5 % ophthalmic solution 1 drop  1 drop Both Eyes BID Wieting, Richard, MD       buPROPion (WELLBUTRIN XL) 24 hr tablet 150 mg  150 mg Oral BH-q7a Cox, Amy N, DO   150 mg at 01/11/22 0900   cyanocobalamin (VITAMIN B12) tablet 1,000 mcg  1,000 mcg Oral Daily Lorella Nimrod, MD   1,000 mcg at 01/11/22 0900   furosemide (LASIX) tablet 20 mg  20 mg Oral Daily [name], MD   20 mg at 01/11/22 1052   gabapentin (NEURONTIN) capsule 100 mg  100 mg Oral QHS Cox, Amy  N, DO   100 mg at 01/10/22 2227   hydrALAZINE (APRESOLINE) tablet 10 mg  10 mg Oral Q6H PRN Cox, Amy N, DO       latanoprost (XALATAN) 0.005 % ophthalmic solution 1 drop  1 drop Both Eyes QHS Cox, Amy N, DO       lidocaine (LIDODERM) 5 % 1 patch  1 patch Transdermal Q24H Cox, Amy N, DO   1 patch at 01/10/22 2229   naproxen (NAPROSYN) tablet 250 mg  250 mg Oral BID WC Wieting, Richard, MD       ondansetron (ZOFRAN) tablet 4 mg  4 mg Oral Q6H PRN Cox, Amy N, DO       Or   ondansetron (ZOFRAN) injection 4 mg  4 mg Intravenous Q6H PRN Cox, Amy N, DO       oxyCODONE (Oxy IR/ROXICODONE) immediate release tablet 2.5 mg  2.5 mg Oral Q3H PRN Cox, Amy N, DO       pantoprazole (PROTONIX) EC tablet 80 mg  80 mg Oral Daily Cox, Amy N, DO   80 mg at 01/11/22 0900   senna-docusate (Senokot-S) tablet 1 tablet  1 tablet Oral QHS PRN Cox, Amy N,  DO       sertraline (ZOLOFT) tablet 50 mg  50 mg Oral BH-q7a Cox, Amy N, DO   50 mg at 01/11/22 0900   simvastatin (ZOCOR) tablet 20 mg  20 mg Oral q1800 Cox, Amy N, DO   20 mg at 01/10/22 1720     Discharge Medications: Please see discharge summary for a list of discharge medications.  Relevant Imaging Results:  Relevant Lab Results:   Additional Information SSN: [id number]  Beverly Sessions, RN

## 2022-01-11 NOTE — Assessment & Plan Note (Addendum)
PPI ?

## 2022-01-11 NOTE — Assessment & Plan Note (Addendum)
This has resolved.

## 2022-01-11 NOTE — Assessment & Plan Note (Addendum)
Patient with orthostasis today.  Hold Norvasc.

## 2022-01-11 NOTE — TOC Initial Note (Signed)
Transition of Care [facility]) - Initial/Assessment Note    Patient Details  Name: Caroline Aguilar MRN: [id number] Date of Birth: 1926/04/05  Transition of Care [facility]) CM/SW Contact:    Beverly Sessions, RN Phone Number: 01/11/2022, 1:31 PM  Clinical Narrative:                    Admitted for: Frequent falls Admitted from: Patient lives in her own home, daughter in law stays with her PCP: Sabra Heck  Current home health/prior home health/DME:Rolling [place] Rile (2 wheels);Cane - single point;Tub bench;Grab bars - toilet Additional Comments: Lift Chair    Therapy recommending SNF.  Patient asleep, daughter in law at bedside.  They are agreeable to bed search.  First choice [facility].   Existing PASSR Fl2 sent for signature Bed Search initiated   Patient has an existing balance at [facility] they are not able to offer a bed due to the balance and not being able to meet the patients needs  Bed offers presented to daughter in law.  She is to disscuss with patient's son and have him call me back with a decision   Patient Goals and CMS Choice        Expected Discharge Plan and Services                                                Prior Living Arrangements/Services                       Activities of Daily Living Home Assistive Devices/Equipment: Gil[place] Rile (specify type) ADL Screening (condition at time of admission) Patient's cognitive ability adequate to safely complete daily activities?: Yes Is the patient deaf or have difficulty hearing?: No Does the patient have difficulty seeing, even when wearing glasses/contacts?: No Does the patient have difficulty concentrating, remembering, or making decisions?: No Patient able to express need for assistance with ADLs?: Yes Does the patient have difficulty dressing or bathing?: No Independently performs ADLs?: Yes (appropriate for developmental age) Does the patient have difficulty walking or climbing stairs?:  Yes Weakness of Legs: Both Weakness of Arms/Hands: None  Permission Sought/Granted                  Emotional Assessment              Admission diagnosis:  Weakness [R53.1] Frequent falls [R29.6] Unable to ambulate [R26.2] Pain of right lower extremity [M79.604] Patient Active Problem List   Diagnosis Date Noted   Frequent falls 01/09/2022   Depression 01/11/2021   Compression fracture of spine ([place]) 01/11/2021   HTN (hypertension) 01/11/2021   Contusion of face 01/31/2020   Stroke ([place]) 06/11/2017   HLD (hyperlipidemia) 06/11/2017   GERD (gastroesophageal reflux disease) 06/11/2017   (HFpEF) heart failure with preserved ejection fraction ([place]) 06/11/2017   Dyspnea 07/15/2015   AF (paroxysmal atrial fibrillation) ([place]) 07/02/2015   PCP:  [name], MD Pharmacy:   [id number] [address], [place] [address] [place] [address] Phone: [phone] Fax: [phone]     Social Determinants of Health (SDOH) Interventions    Readmission Risk Interventions     No data to display

## 2022-01-12 DIAGNOSIS — M533 Sacrococcygeal disorders, not elsewhere classified: Secondary | ICD-10-CM | POA: Diagnosis not present

## 2022-01-12 DIAGNOSIS — I5032 Chronic diastolic (congestive) heart failure: Secondary | ICD-10-CM | POA: Diagnosis not present

## 2022-01-12 DIAGNOSIS — R296 Repeated falls: Secondary | ICD-10-CM | POA: Diagnosis not present

## 2022-01-12 DIAGNOSIS — N179 Acute kidney failure, unspecified: Secondary | ICD-10-CM | POA: Diagnosis not present

## 2022-01-12 LAB — BASIC METABOLIC PANEL
Anion gap: 7 (ref 5–15)
BUN: 39 mg/dL — ABNORMAL HIGH (ref 8–23)
CO2: 28 mmol/L (ref 22–32)
Calcium: 8.3 mg/dL — ABNORMAL LOW (ref 8.9–10.3)
Chloride: 101 mmol/L (ref 98–111)
Creatinine, Ser: 1.14 mg/dL — ABNORMAL HIGH (ref 0.44–1.00)
GFR, Estimated: 45 mL/min — ABNORMAL LOW (ref >60)
Glucose, Bld: 87 mg/dL (ref 70–99)
Potassium: 4.1 mmol/L (ref 3.5–5.1)
Sodium: 136 mmol/L (ref 135–145)

## 2022-01-12 LAB — ECHOCARDIOGRAM COMPLETE
AV Mean grad: 4.5 mmHg
AV Peak grad: 8.4 mmHg
Ao pk vel: 1.45 m/s
Area-P 1/2: 2.66 cm2
Height: 60 in
S' Lateral: 2.5 cm
Weight: 2320 oz

## 2022-01-12 MED ORDER — IPRATROPIUM-ALBUTEROL 0.5-2.5 (3) MG/3ML IN SOLN: 3.0000 mL | Freq: Four times a day (QID) | RESPIRATORY_TRACT | Status: DC | PRN

## 2022-01-12 MED ORDER — IPRATROPIUM-ALBUTEROL 0.5-2.5 (3) MG/3ML IN SOLN
3.0000 mL | Freq: Four times a day (QID) | RESPIRATORY_TRACT | Status: DC
  Administered 2022-01-12: 3 mL via RESPIRATORY_TRACT
  Filled 2022-01-12: qty 3

## 2022-01-12 NOTE — TOC Progression Note (Signed)
Transition of Care [facility]) - Progression Note    Patient Details  Name: Caroline Aguilar MRN: [id number] Date of Birth: 31-May-1925  [facility]) CM/SW Contact  Beverly Sessions, RN Phone Number: 01/12/2022, 12:06 PM  Clinical Narrative:    Josem Kaufmann received.  ID [id number] Valid through 10/2 Per MD not medically ready for discharge today [facility] at [facility] updated         Expected Discharge Plan and Services                                                 Social Determinants of Health (SDOH) Interventions    Readmission Risk Interventions     No data to display

## 2022-01-12 NOTE — Progress Notes (Signed)
Progress Note   Patient: Caroline Aguilar:[id number] DOB: 1925/07/20 DOA: 01/09/2022     2 DOS: the patient was seen and examined on 01/12/2022   Brief hospital course: Caroline Aguilar is a 88 year old female with history of hyperlipidemia, depression, anxiety, hypertension, atrial fibrillation, history of stroke, who presents emergency department for chief concerns of multiple falls and weakness, most of the symptoms are related when she was trying to stand up quickly from sitting position.  Also endorsed poor p.o. intake.  Having difficulty with performing her ADLs.  ROS positive for exertional dyspnea for about a year.  She lives alone, son and daughter-in-law who lives across the street take care of her.  Initial vitals in the ED showed temperature of 98, respiration rate of 19, heart rate of 65, blood pressure 96/55, SPO2 of 94% on room air.  Serum sodium is 134, potassium 4.9, chloride 100, bicarb 28, BUN of 41, serum creatinine 0.87, GFR greater than 60, nonfasting blood glucose 133, WBC 6.9, hemoglobin 12.6, platelets of 187.  COVID/influenza A/influenza B PCR were negative. UA was negative for leukocytes and nitrates.  BNP was 127.8  Portable chest x-ray, right ankle x-ray, right foot complete x-ray, right hip x-ray: Was read as negative for fracture.  ED treatment: None  9/26: Patient again became weak and dizzy while getting out of bed, blood pressure dropped to 92/46 and she received 500 cc of bolus. Patient was only taking 2.5 mg of amlodipine daily per daughter.  Most likely dehydration secondary to poor p.o. intake. Orthostatic vital seems positive. PT/OT are recommending SNF-patient and family agrees. B12 at 196-starting supplement.  Patient short of breath on 01/11/2022 dependent low-dose Lasix 20 mg twice daily and creatinine impaired up to 1.14.  Lasix held on 01/12/2022.  Her right sacroiliac pain was better after 2 doses of Naprosyn on 01/11/2022.  Assessment  and Plan: * AKI (acute kidney injury) ([place]) Creatinine went from 0.66 up to 1.14.  Likely from overdiuresis.  Hold Lasix.  Hold Naprosyn.  Pain of right sacroiliac joint Improved with 2 doses of Naprosyn.  We will go back to Tylenol at this point.  Frequent falls Patient was slightly orthostatic yesterday.  Blood pressure now on the higher side.  Continue low-dose Norvasc for now.  Chronic diastolic CHF (congestive heart failure) (HCC) Hold Lasix with overdiuresis.  Echocardiogram still pending.  Patient still short of breath.  We will give nebulizer treatments and see if that improves things.  AF (paroxysmal atrial fibrillation) (H[facility])  Apixaban 2.5 mg PO BID resumed.  Benefits and risks of blood thinner explained to patient and family especially with frequent falls.  HTN (hypertension) Was orthostatic yesterday.  Continue to monitor closely.  Blood pressure on the higher side yesterday.  Restarted low-dose Norvasc.  Depression - Bupropion 150 mg in AM, sertraline 50 mg daily  HLD (hyperlipidemia) - Simvastatin 20 mg daily   GERD (gastroesophageal reflux disease) - PPI  Vitamin B12 deficiency Continue supplementation        Subjective: Patient still short of breath today.  States her voice is a little bit hoarse.  Her right sacroiliac pain is better.  Urinated well after Lasix yesterday.  Physical Exam: Vitals:   01/11/22 1525 01/11/22 2014 01/12/22 0335 01/12/22 0905  BP: 133/62 (!) 146/74 122/60 138/74  Pulse: 64 67 (!) 59 63  Resp:  16 16   Temp:  97.7 F (36.5 C) 97.7 F (36.5 C) 97.8 F (36.6 C)  TempSrc:  Oral  Oral Oral  SpO2: 96% 100% 95% 95%  Weight:      Height:       Physical Exam HENT:     Head: Normocephalic.     Mouth/Throat:     Pharynx: No oropharyngeal exudate.  Eyes:     General: Lids are normal.     Conjunctiva/sclera: Conjunctivae normal.  Cardiovascular:     Rate and Rhythm: Normal rate and regular rhythm.     Heart sounds: Normal  heart sounds, S1 normal and S2 normal.  Pulmonary:     Breath sounds: Examination of the right-lower field reveals decreased breath sounds. Examination of the left-lower field reveals decreased breath sounds. Decreased breath sounds present. No wheezing, rhonchi or rales.  Abdominal:     Palpations: Abdomen is soft.     Tenderness: There is no abdominal tenderness.  Musculoskeletal:     Right lower leg: No swelling.     Left lower leg: No swelling.     Comments: Right sacroiliac pain to palpation.  Skin:    General: Skin is warm.     Findings: No rash.  Neurological:     Mental Status: She is alert and oriented to person, place, and time.     Data Reviewed: Today's creatinine 1.14  Family Communication: Spoke with family at the bedside  Disposition: Status is: Inpatient Remains inpatient appropriate because: With overdiuresis and still short of breath we will watch today.  Start nebulizer treatments.  Hold Lasix.  Planned Discharge Destination: Rehab    Time spent: 28 minutes  Author: [name], MD 01/12/2022 1:25 PM  For on call review [email or web address].

## 2022-01-12 NOTE — Assessment & Plan Note (Addendum)
Creatinine went from 0.66 up to 1.17.  Likely from overdiuresis.  Creatinine upon discharge 0.94.

## 2022-01-12 NOTE — Progress Notes (Signed)
Physical Therapy Treatment Patient Details Name: NYEEMA WANT MRN: [id number] DOB: 08/28/1925 Today's Date: 01/12/2022   History of Present Illness Ms. Caroline Aguilar is a 88 year old female with history of hyperlipidemia, depression, anxiety, hypertension, atrial fibrillation, history of stroke, who presents emergency department for chief concerns of multiple falls and weakness.    PT Comments    Pt received resting in bed, stated she had been sleeping for 16 hours. Attempted to increase mobility and gait training, however pt very symptomatic once sitting EOB and unable to tolerate more than 1 minute. Vitals as follows:  Supine 98/56, HR 61bpm, 93% O2 sats on RA.  Sitting:  77/46, HR 80bpm, 95% O2 sats. Nursing notified. Currently unable to progress pt until BP issues resolve.   Recommendations for follow up therapy are one component of a multi-disciplinary discharge planning process, led by the attending physician.  Recommendations may be updated based on patient status, additional functional criteria and insurance authorization.  Follow Up Recommendations  Skilled nursing-short term rehab (<3 hours/day) Can patient physically be transported by private vehicle: Yes   Assistance Recommended at Discharge Frequent or constant Supervision/Assistance  Patient can return home with the following A lot of help with walking and/or transfers;A lot of help with bathing/dressing/bathroom;Assist for transportation;Help with stairs or ramp for entrance   Equipment Recommendations  None recommended by PT    Recommendations for Other Services       Precautions / Restrictions Precautions Precautions: Fall Precaution Comments: HOH Restrictions Weight Bearing Restrictions: No     Mobility  Bed Mobility Overal bed mobility: Needs Assistance Bed Mobility: Supine to Sit Rolling: Min assist   Supine to sit: Min guard Sit to supine: Min guard   General bed mobility comments: Pt able to use  bed features to come upright.    Transfers                   General transfer comment: Unable, pt with drop in BP    Ambulation/Gait                   Stairs             Wheelchair Mobility    Modified Rankin (Stroke Patients Only)       Balance Overall balance assessment: Needs assistance Sitting-balance support: No upper extremity supported, Feet unsupported Sitting balance-Leahy Scale: Good Sitting balance - Comments:  (dizziness in sitting)                                    Cognition Arousal/Alertness: Awake/alert Behavior During Therapy: WFL for tasks assessed/performed Overall Cognitive Status: Within Functional Limits for tasks assessed                                 General Comments:  (very pleasant)        Exercises General Exercises - Lower Extremity Ankle Circles/Pumps: AROM, Both, 15 reps Long Arc Quad: AROM, Both, 5 reps Heel Slides: AROM, Both, 10 reps    General Comments General comments (skin integrity, edema, etc.):  (Pt can not wear her mouth bridge anymore and struggles to chew, nursing notified)      Pertinent Vitals/Pain Pain Assessment Pain Assessment: Faces Faces Pain Scale: Hurts a little bit Pain Location: R hip/buttocks Pain Descriptors / Indicators: Aching, Discomfort Pain Intervention(s): Monitored during session  Home Living                          Prior Function            PT Goals (current goals can now be found in the care plan section) Acute Rehab PT Goals Patient Stated Goal: to stop the pain in her legs or figure out what's causing it.    Frequency    Min 2X/week      PT Plan Current plan remains appropriate    Co-evaluation              AM-PAC PT "6 Clicks" Mobility   Outcome Measure  Help needed turning from your back to your side while in a flat bed without using bedrails?: A Little Help needed moving from lying on your back to  sitting on the side of a flat bed without using bedrails?: A Little Help needed moving to and from a bed to a chair (including a wheelchair)?: A Lot Help needed standing up from a chair using your arms (e.g., wheelchair or bedside chair)?: A Little Help needed to walk in hospital room?: A Lot Help needed climbing 3-5 steps with a railing? : A Lot 6 Click Score: 15    End of Session   Activity Tolerance: Other (comment) (Treatment limited due to drop in BP) Patient left: in bed;with call bell/phone within reach;Other (comment) (set up for lunch) Nurse Communication: Mobility status (? change in food consistency) PT Visit Diagnosis: Unsteadiness on feet (R26.81);Other abnormalities of gait and mobility (R26.89);Repeated falls (R29.6);Muscle weakness (generalized) (M62.81);History of falling (Z91.81);Difficulty in walking, not elsewhere classified (R26.2);Pain Pain - Right/Left: Right Pain - part of body: Hip;Leg     Time: 3235-5732 PT Time Calculation (min) (ACUTE ONLY): 26 min  Charges:  $Therapeutic Exercise: 8-22 mins $Therapeutic Activity: 8-22 mins                    Caroline Aguilar, PTA    Caroline Aguilar 01/12/2022, 1:50 PM

## 2022-01-13 DIAGNOSIS — I951 Orthostatic hypotension: Secondary | ICD-10-CM

## 2022-01-13 DIAGNOSIS — M722 Plantar fascial fibromatosis: Secondary | ICD-10-CM | POA: Diagnosis not present

## 2022-01-13 DIAGNOSIS — N179 Acute kidney failure, unspecified: Secondary | ICD-10-CM | POA: Diagnosis not present

## 2022-01-13 DIAGNOSIS — M533 Sacrococcygeal disorders, not elsewhere classified: Secondary | ICD-10-CM | POA: Diagnosis not present

## 2022-01-13 LAB — BASIC METABOLIC PANEL
Anion gap: 6 (ref 5–15)
BUN: 44 mg/dL — ABNORMAL HIGH (ref 8–23)
CO2: 27 mmol/L (ref 22–32)
Calcium: 8.4 mg/dL — ABNORMAL LOW (ref 8.9–10.3)
Chloride: 101 mmol/L (ref 98–111)
Creatinine, Ser: 1.17 mg/dL — ABNORMAL HIGH (ref 0.44–1.00)
GFR, Estimated: 43 mL/min — ABNORMAL LOW (ref >60)
Glucose, Bld: 83 mg/dL (ref 70–99)
Potassium: 4.1 mmol/L (ref 3.5–5.1)
Sodium: 134 mmol/L — ABNORMAL LOW (ref 135–145)

## 2022-01-13 MED ORDER — SODIUM CHLORIDE 0.9 % IV BOLUS
250.0000 mL | Freq: Once | INTRAVENOUS | Status: AC
  Administered 2022-01-13: 250 mL via INTRAVENOUS

## 2022-01-13 NOTE — Progress Notes (Signed)
Progress Note   Patient: Caroline Aguilar [id number]:[id number] DOB: January 18, 1926 DOA: 01/09/2022     3 DOS: the patient was seen and examined on 01/13/2022   Brief hospital course: Caroline Aguilar is a 88 year old female with history of hyperlipidemia, depression, anxiety, hypertension, atrial fibrillation, history of stroke, who presents emergency department for chief concerns of multiple falls and weakness, most of the symptoms are related when she was trying to stand up quickly from sitting position.  Also endorsed poor p.o. intake.  Having difficulty with performing her ADLs.  ROS positive for exertional dyspnea for about a year.  She lives alone, son and daughter-in-law who lives across the street take care of her.  Initial vitals in the ED showed temperature of 98, respiration rate of 19, heart rate of 65, blood pressure 96/55, SPO2 of 94% on room air.  Serum sodium is 134, potassium 4.9, chloride 100, bicarb 28, BUN of 41, serum creatinine 0.87, GFR greater than 60, nonfasting blood glucose 133, WBC 6.9, hemoglobin 12.6, platelets of 187.  COVID/influenza A/influenza B PCR were negative. UA was negative for leukocytes and nitrates.  BNP was 127.8  Portable chest x-ray, right ankle x-ray, right foot complete x-ray, right hip x-ray: Was read as negative for fracture.  ED treatment: None  9/26: Patient again became weak and dizzy while getting out of bed, blood pressure dropped to 92/46 and she received 500 cc of bolus. Patient was only taking 2.5 mg of amlodipine daily per daughter.  Most likely dehydration secondary to poor p.o. intake. Orthostatic vital seems positive. PT/OT are recommending SNF-patient and family agrees. B12 at 196-starting supplement.  Patient short of breath on 01/11/2022 dependent low-dose Lasix 20 mg twice daily and creatinine impaired up to 1.14.  Lasix held on 01/12/2022.  Her right sacroiliac pain was better after 2 doses of Naprosyn on 01/11/2022.  01/13/2022  her creatinine was still impaired at 1.17. She was complaining of right heel pain consistent with plantars fasciitis.  She was orthostatic with occupational therapy with her blood pressure dropping down to 85/61 with standing.  Assessment and Plan: * AKI (acute kidney injury) ([place]) Creatinine went from 0.66 up to 1.17.  Likely from overdiuresis.  Hold Lasix.  Hold Naprosyn.  We will give 2 small fluid boluses of 250 cc and reassess tomorrow.  Orthostatic hypotension 2 fluid boluses today and hold Norvasc.  Plantar fasciitis of right foot Reviewed x-ray.  Unable to do anti-inflammatories secondary to acute kidney injury.  Unable to do steroids secondary to hallucinations.  Ice, stretching, Tylenol.  Chronic diastolic CHF (congestive heart failure) ([place]) I am doubting this diagnosis at this point.  With 2 doses of Lasix went into acute kidney injury.  Patient's respiratory status has been stable.  Improved with nebulizer treatment.  Hold Lasix with acute kidney injury.  Echocardiogram showed a normal EF.  Pain of right sacroiliac joint Improved with 2 doses of Naprosyn.  We will go back to Tylenol at this point.  AF (paroxysmal atrial fibrillation) ([facility])  Apixaban 2.5 mg PO BID resumed.  Benefits and risks of blood thinner explained to patient and family especially with frequent falls.  HTN (hypertension) Patient with orthostatic hypotension.  Would like blood pressure on the higher side rather than too low.  Discontinue Norvasc.  Frequent falls Patient with orthostasis today.  Hold Norvasc.  Depression - Bupropion 150 mg in AM, sertraline 50 mg daily  HLD (hyperlipidemia) - Simvastatin 20 mg daily   GERD (gastroesophageal reflux disease) -  PPI  Vitamin B12 deficiency Continue supplementation        Subjective: Patient thinks her breathing is a little bit better after some nebulizers.  Patient's creatinine still impaired today.  Patient complains of right foot pain on the  bottom of her foot near the heel.  Notified by physical therapy team that she was very orthostatic today.  Physical Exam: Vitals:   01/12/22 1732 01/12/22 1945 01/13/22 0444 01/13/22 0734  BP: (!) 153/84 (!) 172/68 (!) 111/52 (!) 140/85  Pulse: 65 62 (!) 57 70  Resp: '18 18 20 12  '$ Temp:  (!) 97.5 F (36.4 C) 97.8 F (36.6 C) 97.9 F (36.6 C)  TempSrc:  Oral Oral   SpO2:  97% 95%   Weight:      Height:       Physical Exam HENT:     Head: Normocephalic.     Mouth/Throat:     Pharynx: No oropharyngeal exudate.  Eyes:     General: Lids are normal.     Conjunctiva/sclera: Conjunctivae normal.  Cardiovascular:     Rate and Rhythm: Normal rate and regular rhythm.     Heart sounds: Normal heart sounds, S1 normal and S2 normal.  Pulmonary:     Breath sounds: Examination of the right-lower field reveals decreased breath sounds. Examination of the left-lower field reveals decreased breath sounds. Decreased breath sounds present. No wheezing, rhonchi or rales.  Abdominal:     Palpations: Abdomen is soft.     Tenderness: There is no abdominal tenderness.  Musculoskeletal:     Right lower leg: No swelling.     Left lower leg: No swelling.     Comments: Right sacroiliac pain resolved.  Feet:     Comments: Pain to palpation near the heel consistent with right plantar fasciitis. Skin:    General: Skin is warm.     Findings: No rash.  Neurological:     Mental Status: She is alert and oriented to person, place, and time.     Data Reviewed: Creatinine 1.17  Family Communication: Spoke with daughter at the bedside this morning  Disposition: Status is: Inpatient Remains inpatient appropriate because: Creatinine worsening.  Having foot pain and also orthostatic.  Planned Discharge Destination: Rehab    Time spent: 28 minutes  Author: [name], MD 01/13/2022 2:10 PM  For on call review [email or web address].

## 2022-01-13 NOTE — Assessment & Plan Note (Addendum)
Reviewed x-ray.  Unable to do steroids secondary to hallucinations.  Heating pad, stretching, Tylenol.

## 2022-01-13 NOTE — Progress Notes (Signed)
Occupational Therapy Treatment Patient Details Name: Caroline Aguilar MRN: [id number] DOB: 04/28/1925 Today's Date: 01/13/2022   History of present illness Caroline Aguilar is a 88 year old female with history of hyperlipidemia, depression, anxiety, hypertension, atrial fibrillation, history of stroke, who presents emergency department for chief concerns of multiple falls and weakness.   OT comments  Caroline Aguilar was seen for OT treatment on this date. Upon arrival to room pt reclined in bed, agreeable to tx. Pt requires CGA exit bed, becomes dizzy and returned to bed. Orthostatics taken, MD notified positive. MOD A don underwear, assist for threading and standing portion. MIN A + RW sit<>stand and bed>chair SPT. MAX A don B socks seated EOB. Pt making good progress toward goals, will continue to follow POC. Discharge recommendation remains appropriate.   supine: BP 127/64, MAP 82, HR 70 seated: BP 102/64, MAP 75, HR 74, + dizzy standing: BP 85/61, MAP 67, HR 79 , + dizzy    Recommendations for follow up therapy are one component of a multi-disciplinary discharge planning process, led by the attending physician.  Recommendations may be updated based on patient status, additional functional criteria and insurance authorization.    Follow Up Recommendations  Skilled nursing-short term rehab (<3 hours/day)    Assistance Recommended at Discharge Frequent or constant Supervision/Assistance  Patient can return home with the following  A lot of help with walking and/or transfers;A lot of help with bathing/dressing/bathroom   Equipment Recommendations  BSC/3in1    Recommendations for Other Services      Precautions / Restrictions Precautions Precautions: Fall Restrictions Weight Bearing Restrictions: No       Mobility Bed Mobility Overal bed mobility: Needs Assistance Bed Mobility: Supine to Sit     Supine to sit: Min guard          Transfers Overall transfer level: Needs  assistance Equipment used: Rolling walker (2 wheels) Transfers: Sit to/from Stand, Bed to chair/wheelchair/BSC Sit to Stand: Min assist     Step pivot transfers: Min assist           Balance Overall balance assessment: Needs assistance Sitting-balance support: No upper extremity supported, Feet unsupported Sitting balance-Leahy Scale: Good     Standing balance support: Bilateral upper extremity supported Standing balance-Leahy Scale: Poor                             ADL either performed or assessed with clinical judgement   ADL Overall ADL's : Needs assistance/impaired                                       General ADL Comments: MIN A + RW for simulated BSC t/f. MAX A don B socks seated EOB.      Cognition Arousal/Alertness: Awake/alert Behavior During Therapy: WFL for tasks assessed/performed Overall Cognitive Status: Within Functional Limits for tasks assessed                                                General Comments supine: 127/64 seated: 102/64, standing: 85/61    Pertinent Vitals/ Pain       Pain Assessment Pain Assessment: No/denies pain   Frequency  Min 2X/week        Progress  Toward Goals  OT Goals(current goals can now be found in the care plan section)  Progress towards OT goals: Progressing toward goals  Acute Rehab OT Goals Patient Stated Goal: to go home OT Goal Formulation: With patient Time For Goal Achievement: 01/24/22 Potential to Achieve Goals: Good ADL Goals Pt Will Perform Grooming: with supervision;standing Pt Will Perform Lower Body Dressing: with min assist;sit to/from stand Pt Will Transfer to Toilet: with min guard assist;ambulating;bedside commode  Plan Discharge plan remains appropriate;Frequency remains appropriate    Co-evaluation                 AM-PAC OT "6 Clicks" Daily Activity     Outcome Measure   Help from another person eating meals?: None Help  from another person taking care of personal grooming?: A Little Help from another person toileting, which includes using toliet, bedpan, or urinal?: A Lot Help from another person bathing (including washing, rinsing, drying)?: A Lot Help from another person to put on and taking off regular upper body clothing?: A Little Help from another person to put on and taking off regular lower body clothing?: A Lot 6 Click Score: 16    End of Session Equipment Utilized During Treatment: Rolling walker (2 wheels)  OT Visit Diagnosis: Other abnormalities of gait and mobility (R26.89);Muscle weakness (generalized) (M62.81)   Activity Tolerance Patient tolerated treatment well   Patient Left in chair;with call bell/phone within reach;with family/visitor present   Nurse Communication          Time: 4765-4650 OT Time Calculation (min): 24 min  Charges: OT General Charges $OT Visit: 1 Visit OT Treatments $Self Care/Home Management : 8-22 mins $Therapeutic Activity: 8-22 mins  Dessie Coma, M.S. OTR/L  01/13/22, 1:14 PM  ascom [phone]

## 2022-01-13 NOTE — Care Management Important Message (Signed)
Important Message  Patient Details  Name: KATHEE TUMLIN MRN: [id number] Date of Birth: 10-16-1925   Medicare Important Message Given:  Yes     Dannette Barbara 01/13/2022, 10:52 AM

## 2022-01-13 NOTE — Assessment & Plan Note (Signed)
Continue to hold Norvasc.  Low-dose midodrine for systolic blood pressure less than 140.

## 2022-01-13 NOTE — TOC Progression Note (Signed)
Transition of Care [facility]) - Progression Note    Patient Details  Name: Caroline Aguilar MRN: [id number] Date of Birth: Sep 26, 1925  [facility] of [facility] CM/SW Contact  Beverly Sessions, RN Phone Number: 01/13/2022, 3:07 PM  Clinical Narrative:    Per MD patient not ready for discharge [facility] updated.  Per Tammy they can admit over the weekend if patient is ready         Expected Discharge Plan and Services                                                 Social Determinants of Health (SDOH) Interventions    Readmission Risk Interventions     No data to display

## 2022-01-14 ENCOUNTER — Inpatient Hospital Stay: Payer: Medicare HMO

## 2022-01-14 DIAGNOSIS — M533 Sacrococcygeal disorders, not elsewhere classified: Secondary | ICD-10-CM | POA: Diagnosis not present

## 2022-01-14 DIAGNOSIS — M722 Plantar fascial fibromatosis: Secondary | ICD-10-CM | POA: Diagnosis not present

## 2022-01-14 DIAGNOSIS — I951 Orthostatic hypotension: Secondary | ICD-10-CM | POA: Diagnosis not present

## 2022-01-14 DIAGNOSIS — N179 Acute kidney failure, unspecified: Secondary | ICD-10-CM | POA: Diagnosis not present

## 2022-01-14 LAB — BASIC METABOLIC PANEL
Anion gap: 7 (ref 5–15)
BUN: 35 mg/dL — ABNORMAL HIGH (ref 8–23)
CO2: 29 mmol/L (ref 22–32)
Calcium: 8.7 mg/dL — ABNORMAL LOW (ref 8.9–10.3)
Chloride: 100 mmol/L (ref 98–111)
Creatinine, Ser: 0.91 mg/dL (ref 0.44–1.00)
GFR, Estimated: 58 mL/min — ABNORMAL LOW (ref >60)
Glucose, Bld: 122 mg/dL — ABNORMAL HIGH (ref 70–99)
Potassium: 4.3 mmol/L (ref 3.5–5.1)
Sodium: 136 mmol/L (ref 135–145)

## 2022-01-14 MED ORDER — SODIUM CHLORIDE 0.9 % IV BOLUS
250.0000 mL | Freq: Once | INTRAVENOUS | Status: AC
  Administered 2022-01-14: 250 mL via INTRAVENOUS

## 2022-01-14 MED ORDER — MIDODRINE HCL 5 MG PO TABS
5.0000 mg | ORAL_TABLET | Freq: Three times a day (TID) | ORAL | Status: DC
  Administered 2022-01-14 (×2): 5 mg via ORAL
  Filled 2022-01-14 (×2): qty 1

## 2022-01-14 MED ORDER — MOMETASONE FURO-FORMOTEROL FUM 200-5 MCG/ACT IN AERO
2.0000 | INHALATION_SPRAY | Freq: Two times a day (BID) | RESPIRATORY_TRACT | Status: DC
  Administered 2022-01-14 – 2022-01-17 (×6): 2 via RESPIRATORY_TRACT
  Filled 2022-01-14: qty 8.8

## 2022-01-14 MED ORDER — NAPROXEN 250 MG PO TABS
250.0000 mg | ORAL_TABLET | Freq: Every day | ORAL | Status: DC
  Administered 2022-01-14 – 2022-01-17 (×4): 250 mg via ORAL
  Filled 2022-01-14 (×4): qty 1

## 2022-01-14 NOTE — Plan of Care (Signed)
  Problem: Pain Managment: Goal: General experience of comfort will improve Outcome: Progressing   Problem: Safety: Goal: Ability to remain free from injury will improve Outcome: Progressing   Problem: Skin Integrity: Goal: Risk for impaired skin integrity will decrease Outcome: Progressing   

## 2022-01-14 NOTE — Progress Notes (Signed)
Progress Note   Patient: Caroline Aguilar GHW:[id number] DOB: 10/09/1925 DOA: 01/09/2022     4 DOS: the patient was seen and examined on 01/14/2022   Brief hospital course: Ms. Chalyn Amescua is a 88 year old female with history of hyperlipidemia, depression, anxiety, hypertension, atrial fibrillation, history of stroke, who presents emergency department for chief concerns of multiple falls and weakness, most of the symptoms are related when she was trying to stand up quickly from sitting position.  Also endorsed poor p.o. intake.  Having difficulty with performing her ADLs.  ROS positive for exertional dyspnea for about a year.  She lives alone, son and daughter-in-law who lives across the street take care of her.  Initial vitals in the ED showed temperature of 98, respiration rate of 19, heart rate of 65, blood pressure 96/55, SPO2 of 94% on room air.  Serum sodium is 134, potassium 4.9, chloride 100, bicarb 28, BUN of 41, serum creatinine 0.87, GFR greater than 60, nonfasting blood glucose 133, WBC 6.9, hemoglobin 12.6, platelets of 187.  COVID/influenza A/influenza B PCR were negative. UA was negative for leukocytes and nitrates.  BNP was 127.8  Portable chest x-ray, right ankle x-ray, right foot complete x-ray, right hip x-ray: Was read as negative for fracture.  ED treatment: None  9/26: Patient again became weak and dizzy while getting out of bed, blood pressure dropped to 92/46 and she received 500 cc of bolus. Patient was only taking 2.5 mg of amlodipine daily per daughter.  Most likely dehydration secondary to poor p.o. intake. Orthostatic vital seems positive. PT/OT are recommending SNF-patient and family agrees. B12 at 196-starting supplement.  Patient short of breath on 01/11/2022 dependent low-dose Lasix 20 mg twice daily and creatinine impaired up to 1.14.  Lasix held on 01/12/2022.  Her right sacroiliac pain was better after 2 doses of Naprosyn on 01/11/2022.  01/13/2022  her creatinine was still impaired at 1.17. She was complaining of right heel pain consistent with plantars fasciitis.  She was orthostatic with occupational therapy with her blood pressure dropping down to 85/61 with standing.  01/14/2022.  Still orthostatic with nursing staff.  Another fluid bolus and midodrine started.  CT scan of the chest negative for pneumonia or pulmonary edema.  Assessment and Plan: * Orthostatic hypotension Continue to hold Norvasc.  Another fluid bolus today.  Start low-dose midodrine.  AKI (acute kidney injury) ([place]) Creatinine went from 0.66 up to 1.17.  Likely from overdiuresis.  Creatinine improved to 0.91.  Plantar fasciitis of right foot Reviewed x-ray.  Unable to do steroids secondary to hallucinations.  With creatinine improved we will give Naprosyn once today.  Ice, stretching, Tylenol.  Chronic diastolic CHF (congestive heart failure) ([facility]) Ruled out  Pain of right sacroiliac joint This has resolved.  AF (paroxysmal atrial fibrillation) ([facility]) Continue Eliquis.  HTN (hypertension) Patient with orthostatic hypotension.  Would like blood pressure on the higher side rather than too low.  Discontinued Norvasc.  Frequent falls Patient with orthostasis today.  Hold Norvasc.  Depression - Bupropion 150 mg in AM, sertraline 50 mg daily  HLD (hyperlipidemia) - Simvastatin 20 mg daily   GERD (gastroesophageal reflux disease) - PPI  Vitamin B12 deficiency Continue supplementation        Subjective:  Patient still complains of right foot pain.  States she is having some shortness of breath.  Felt dizzy just with sitting up with the nursing staff today.  Was very orthostatic yesterday.  Physical Exam: Vitals:   01/13/22 1940  01/14/22 0406 01/14/22 0744 01/14/22 1100  BP: (!) 176/81 133/74 (!) 152/70   Pulse: 68 70 60   Resp: '20 20 16   '$ Temp: (!) 97 F (36.1 C) 97.8 F (36.6 C) 98.2 F (36.8 C)   TempSrc: Oral Oral Oral   SpO2: 97% 94%  97% 95%  Weight:      Height:       Physical Exam HENT:     Head: Normocephalic.     Mouth/Throat:     Pharynx: No oropharyngeal exudate.  Eyes:     General: Lids are normal.     Conjunctiva/sclera: Conjunctivae normal.  Cardiovascular:     Rate and Rhythm: Normal rate and regular rhythm.     Heart sounds: Normal heart sounds, S1 normal and S2 normal.  Pulmonary:     Breath sounds: Examination of the right-lower field reveals decreased breath sounds. Examination of the left-lower field reveals decreased breath sounds. Decreased breath sounds present. No wheezing, rhonchi or rales.  Abdominal:     Palpations: Abdomen is soft.     Tenderness: There is no abdominal tenderness.  Musculoskeletal:     Right lower leg: No swelling.     Left lower leg: No swelling.     Comments: Right sacroiliac pain resolved.  Feet:     Comments: Pain to palpation near the heel consistent with right plantar fasciitis. Skin:    General: Skin is warm.     Findings: No rash.  Neurological:     Mental Status: She is alert and oriented to person, place, and time.     Data Reviewed: CT scan of the chest no signs of edema or pneumonia.  Stable thoracic aneurysm of 4.8 cm chronic vertebral compression fracture T2 Creatinine 0.91, sodium 136   Family Communication: Updated daughter-in-law on the phone  Disposition: Status is: Inpatient Remains inpatient appropriate because: Still orthostatic today started midodrine and fluid bolus.  Planned Discharge Destination: Rehab    Time spent: 28 minutes  Author: [name], MD 01/14/2022 2:47 PM  For on call review [email or web address].

## 2022-01-14 NOTE — Progress Notes (Signed)
Physical Therapy Treatment Patient Details Name: Caroline Aguilar MRN: [id number] DOB: February 05, 1926 Today's Date: 01/14/2022   History of Present Illness Ms. Caroline Aguilar is a 88 year old female with history of hyperlipidemia, depression, anxiety, hypertension, atrial fibrillation, history of stroke, who presents emergency department for chief concerns of multiple falls and weakness.    PT Comments    Request for Orthostatic vitals completed during session to increase mobility. Supine BP 94/53, HR 60. Pt assisted to sitting edge of bed with MinA, however only tolerated ~30 seconds before c/o significant dizziness and feeling like she was going to pass out. Seated BP 91/56, HR 76.  Pt quickly assisted back to supine where symptoms resolved after 3 minutes with BP at 104/55, HR 58.  Pt unable to tolerate further activity due to lethargy and BP concerns.  Will continue to progress mobility as tolerated. Continue per POC.    Recommendations for follow up therapy are one component of a multi-disciplinary discharge planning process, led by the attending physician.  Recommendations may be updated based on patient status, additional functional criteria and insurance authorization.  Follow Up Recommendations  Skilled nursing-short term rehab (<3 hours/day) Can patient physically be transported by private vehicle: Yes   Assistance Recommended at Discharge Frequent or constant Supervision/Assistance  Patient can return home with the following A lot of help with walking and/or transfers;A lot of help with bathing/dressing/bathroom;Assist for transportation;Help with stairs or ramp for entrance   Equipment Recommendations  None recommended by PT    Recommendations for Other Services       Precautions / Restrictions Precautions Precautions: Fall Precaution Comments: HOH Restrictions Weight Bearing Restrictions: No     Mobility  Bed Mobility Overal bed mobility: Needs Assistance Bed Mobility:  Supine to Sit Rolling: Min assist   Supine to sit: Min guard Sit to supine: Min guard   General bed mobility comments: Pt able to use bed features to come upright.    Transfers                   General transfer comment: Unable, pt with drop in BP    Ambulation/Gait                   Stairs             Wheelchair Mobility    Modified Rankin (Stroke Patients Only)       Balance Overall balance assessment: Needs assistance Sitting-balance support: No upper extremity supported, Feet unsupported Sitting balance-Leahy Scale: Good                                      Cognition Arousal/Alertness: Lethargic Behavior During Therapy: WFL for tasks assessed/performed Overall Cognitive Status: Within Functional Limits for tasks assessed                                          Exercises      General Comments General comments (skin integrity, edema, etc.):  (New diagnosis of Plantar Fasciitis, Pt given printed educational information)      Pertinent Vitals/Pain Pain Assessment Pain Assessment: Faces Faces Pain Scale: Hurts little more Pain Location: feet Pain Descriptors / Indicators: Aching, Discomfort Pain Intervention(s): Repositioned    Home Living  Prior Function            PT Goals (current goals can now be found in the care plan section) Acute Rehab PT Goals Patient Stated Goal: to stop the pain in her legs or figure out what's causing it.    Frequency    Min 2X/week      PT Plan Current plan remains appropriate    Co-evaluation              AM-PAC PT "6 Clicks" Mobility   Outcome Measure  Help needed turning from your back to your side while in a flat bed without using bedrails?: A Little Help needed moving from lying on your back to sitting on the side of a flat bed without using bedrails?: A Little Help needed moving to and from a bed to a chair  (including a wheelchair)?: A Lot Help needed standing up from a chair using your arms (e.g., wheelchair or bedside chair)?: A Little Help needed to walk in hospital room?: A Lot Help needed climbing 3-5 steps with a railing? : A Lot 6 Click Score: 15    End of Session   Activity Tolerance: Patient limited by lethargy Patient left: in bed;with call bell/phone within reach;with bed alarm set   PT Visit Diagnosis: Unsteadiness on feet (R26.81);Other abnormalities of gait and mobility (R26.89);Repeated falls (R29.6);Muscle weakness (generalized) (M62.81);History of falling (Z91.81);Difficulty in walking, not elsewhere classified (R26.2);Pain Pain - Right/Left: Right Pain - part of body: Hip;Leg     Time: 6720-9470 PT Time Calculation (min) (ACUTE ONLY): 17 min  Charges:  $Therapeutic Activity: 8-22 mins                    Mikel Cella, PTA  Josie Dixon 01/14/2022, 11:21 AM

## 2022-01-15 DIAGNOSIS — N179 Acute kidney failure, unspecified: Secondary | ICD-10-CM | POA: Diagnosis not present

## 2022-01-15 DIAGNOSIS — I951 Orthostatic hypotension: Secondary | ICD-10-CM | POA: Diagnosis not present

## 2022-01-15 DIAGNOSIS — I5032 Chronic diastolic (congestive) heart failure: Secondary | ICD-10-CM | POA: Diagnosis not present

## 2022-01-15 DIAGNOSIS — M722 Plantar fascial fibromatosis: Secondary | ICD-10-CM | POA: Diagnosis not present

## 2022-01-15 LAB — BASIC METABOLIC PANEL
Anion gap: 4 — ABNORMAL LOW (ref 5–15)
BUN: 30 mg/dL — ABNORMAL HIGH (ref 8–23)
CO2: 28 mmol/L (ref 22–32)
Calcium: 8.6 mg/dL — ABNORMAL LOW (ref 8.9–10.3)
Chloride: 106 mmol/L (ref 98–111)
Creatinine, Ser: 0.77 mg/dL (ref 0.44–1.00)
GFR, Estimated: >60 mL/min (ref >60)
Glucose, Bld: 86 mg/dL (ref 70–99)
Potassium: 4.5 mmol/L (ref 3.5–5.1)
Sodium: 138 mmol/L (ref 135–145)

## 2022-01-15 LAB — CBC
HCT: 42.3 % (ref 36.0–46.0)
Hemoglobin: 13.6 g/dL (ref 12.0–15.0)
MCH: 29.6 pg (ref 26.0–34.0)
MCHC: 32.2 g/dL (ref 30.0–36.0)
MCV: 92.2 fL (ref 80.0–100.0)
Platelets: 233 10*3/uL (ref 150–400)
RBC: 4.59 MIL/uL (ref 3.87–5.11)
RDW: 12.2 % (ref 11.5–15.5)
WBC: 7.2 10*3/uL (ref 4.0–10.5)
nRBC: 0 % (ref 0.0–0.2)

## 2022-01-15 MED ORDER — MIDODRINE HCL 5 MG PO TABS
2.5000 mg | ORAL_TABLET | Freq: Three times a day (TID) | ORAL | Status: DC
  Administered 2022-01-15 – 2022-01-16 (×2): 2.5 mg via ORAL
  Filled 2022-01-15 (×2): qty 1

## 2022-01-15 NOTE — Progress Notes (Signed)
Progress Note   Patient: Caroline Aguilar [id number]:[id number] DOB: 04/19/1925 DOA: 01/09/2022     5 DOS: the patient was seen and examined on 01/15/2022   Brief hospital course: Ms. Caroline Aguilar is a 88 year old female with history of hyperlipidemia, depression, anxiety, hypertension, atrial fibrillation, history of stroke, who presents emergency department for chief concerns of multiple falls and weakness, most of the symptoms are related when she was trying to stand up quickly from sitting position.  Also endorsed poor p.o. intake.  Having difficulty with performing her ADLs.  ROS positive for exertional dyspnea for about a year.  She lives alone, son and daughter-in-law who lives across the street take care of her.  Initial vitals in the ED showed temperature of 98, respiration rate of 19, heart rate of 65, blood pressure 96/55, SPO2 of 94% on room air.  Serum sodium is 134, potassium 4.9, chloride 100, bicarb 28, BUN of 41, serum creatinine 0.87, GFR greater than 60, nonfasting blood glucose 133, WBC 6.9, hemoglobin 12.6, platelets of 187.  COVID/influenza A/influenza B PCR were negative. UA was negative for leukocytes and nitrates.  BNP was 127.8  Portable chest x-ray, right ankle x-ray, right foot complete x-ray, right hip x-ray: Was read as negative for fracture.  ED treatment: None  9/26: Patient again became weak and dizzy while getting out of bed, blood pressure dropped to 92/46 and she received 500 cc of bolus. Patient was only taking 2.5 mg of amlodipine daily per daughter.  Most likely dehydration secondary to poor p.o. intake. Orthostatic vital seems positive. PT/OT are recommending SNF-patient and family agrees. B12 at 196-starting supplement.  Patient short of breath on 01/11/2022 dependent low-dose Lasix 20 mg twice daily and creatinine impaired up to 1.14.  Lasix held on 01/12/2022.  Her right sacroiliac pain was better after 2 doses of Naprosyn on 01/11/2022.  01/13/2022  her creatinine was still impaired at 1.17. She was complaining of right heel pain consistent with plantars fasciitis.  She was orthostatic with occupational therapy with her blood pressure dropping down to 85/61 with standing.  01/14/2022.  Still orthostatic with nursing staff.  Another fluid bolus and midodrine started.  CT scan of the chest negative for pneumonia or pulmonary edema.  Assessment and Plan: * Orthostatic hypotension Continue to hold Norvasc.  Low-dose midodrine for systolic blood pressure less than 140.  AKI (acute kidney injury) ([place]) Creatinine went from 0.66 up to 1.17.  Likely from overdiuresis.  Creatinine improved to 0.77  Plantar fasciitis of right foot Reviewed x-ray.  Unable to do steroids secondary to hallucinations.  With creatinine improved, continue Naprosyn once a day.  Ice, stretching, Tylenol.  Chronic diastolic CHF (congestive heart failure) (HCC) Ruled out  Pain of right sacroiliac joint This has resolved.  AF (paroxysmal atrial fibrillation) (HCC) Continue Eliquis.  HTN (hypertension) Patient with orthostatic hypotension.  Would like blood pressure on the higher side rather than too low.  Discontinued Norvasc.  Frequent falls Likely secondary to orthostatic hypotension.  Hold Norvasc.  Depression Bupropion 150 mg in AM, sertraline 50 mg daily  HLD (hyperlipidemia) Simvastatin 20 mg daily   GERD (gastroesophageal reflux disease) PPI  Vitamin B12 deficiency Continue supplementation        Subjective: Patient's right foot not as painful today.  Shortness of breath little bit better.  Was orthostatic yesterday.  Patient coming in with frequent falls.  Physical Exam: Vitals:   01/14/22 1927 01/15/22 0513 01/15/22 0743 01/15/22 1210  BP: (!) 155/64 Marland Kitchen)  142/80 (!) 179/82 108/88  Pulse: 65 (!) 57 (!) 40 (!) 58  Resp: '20 16 16   '$ Temp: 98.1 F (36.7 C) (!) 97.5 F (36.4 C) 97.7 F (36.5 C)   TempSrc: Oral Oral Oral   SpO2: 100% 95%  (!) 83%   Weight:      Height:       Physical Exam HENT:     Head: Normocephalic.     Mouth/Throat:     Pharynx: No oropharyngeal exudate.  Eyes:     General: Lids are normal.     Conjunctiva/sclera: Conjunctivae normal.  Cardiovascular:     Rate and Rhythm: Normal rate and regular rhythm.     Heart sounds: Normal heart sounds, S1 normal and S2 normal.  Pulmonary:     Breath sounds: Examination of the right-lower field reveals decreased breath sounds. Examination of the left-lower field reveals decreased breath sounds. Decreased breath sounds present. No wheezing, rhonchi or rales.  Abdominal:     Palpations: Abdomen is soft.     Tenderness: There is no abdominal tenderness.  Musculoskeletal:     Right lower leg: No swelling.     Left lower leg: No swelling.     Comments: Right sacroiliac pain resolved.  Feet:     Comments: Last discomfort when palpating near the right heel. Skin:    General: Skin is warm.     Findings: No rash.  Neurological:     Mental Status: She is alert and oriented to person, place, and time.     Data Reviewed: Creatinine 0.77, hemoglobin 13.6 Family Communication: Spoke with daughter-in-law on the phone  Disposition: Status is: Inpatient Remains inpatient appropriate because: Was orthostatic today.  Need to decide what to do with midodrine prior to disposition.  Awaiting orthostatics today.  Planned Discharge Destination: Rehab    Time spent: 27 minutes  Author: [name], MD 01/15/2022 1:15 PM  For on call review [email or web address].

## 2022-01-16 DIAGNOSIS — M79604 Pain in right leg: Secondary | ICD-10-CM

## 2022-01-16 DIAGNOSIS — M722 Plantar fascial fibromatosis: Secondary | ICD-10-CM | POA: Diagnosis not present

## 2022-01-16 DIAGNOSIS — I480 Paroxysmal atrial fibrillation: Secondary | ICD-10-CM | POA: Diagnosis not present

## 2022-01-16 DIAGNOSIS — N179 Acute kidney failure, unspecified: Secondary | ICD-10-CM | POA: Diagnosis not present

## 2022-01-16 DIAGNOSIS — I951 Orthostatic hypotension: Secondary | ICD-10-CM | POA: Diagnosis not present

## 2022-01-16 LAB — GLUCOSE, CAPILLARY: Glucose-Capillary: 105 mg/dL — ABNORMAL HIGH (ref 70–99)

## 2022-01-16 MED ORDER — MIDODRINE HCL 5 MG PO TABS: 2.5000 mg | ORAL_TABLET | Freq: Two times a day (BID) | ORAL | Status: DC

## 2022-01-16 MED ORDER — MIDODRINE HCL 5 MG PO TABS
5.0000 mg | ORAL_TABLET | Freq: Every day | ORAL | Status: DC
  Administered 2022-01-17: 5 mg via ORAL
  Filled 2022-01-16 (×2): qty 1

## 2022-01-16 NOTE — Care Management Important Message (Signed)
Important Message  Patient Details  Name: Caroline Aguilar MRN: [id number] Date of Birth: 1925/12/25   Medicare Important Message Given:  Yes     Dannette Barbara 01/16/2022, 12:46 PM

## 2022-01-16 NOTE — Progress Notes (Signed)
Progress Note   Patient: Caroline Aguilar [id number]:[id number] DOB: 06-15-1925 DOA: 01/09/2022     6 DOS: the patient was seen and examined on 01/16/2022   Brief hospital course: Ms. Caroline Aguilar is a 88 year old female with history of hyperlipidemia, depression, anxiety, hypertension, atrial fibrillation, history of stroke, who presents emergency department for chief concerns of multiple falls and weakness, most of the symptoms are related when she was trying to stand up quickly from sitting position.  Also endorsed poor p.o. intake.  Having difficulty with performing her ADLs.  ROS positive for exertional dyspnea for about a year.  She lives alone, son and daughter-in-law who lives across the street take care of her.  Initial vitals in the ED showed temperature of 98, respiration rate of 19, heart rate of 65, blood pressure 96/55, SPO2 of 94% on room air.  Serum sodium is 134, potassium 4.9, chloride 100, bicarb 28, BUN of 41, serum creatinine 0.87, GFR greater than 60, nonfasting blood glucose 133, WBC 6.9, hemoglobin 12.6, platelets of 187.  COVID/influenza A/influenza B PCR were negative. UA was negative for leukocytes and nitrates.  BNP was 127.8  Portable chest x-ray, right ankle x-ray, right foot complete x-ray, right hip x-ray: Was read as negative for fracture.  ED treatment: None  9/26: Patient again became weak and dizzy while getting out of bed, blood pressure dropped to 92/46 and she received 500 cc of bolus. Patient was only taking 2.5 mg of amlodipine daily per daughter.  Most likely dehydration secondary to poor p.o. intake. Orthostatic vital seems positive. PT/OT are recommending SNF-patient and family agrees. B12 at 196-starting supplement.  Patient short of breath on 01/11/2022 dependent low-dose Lasix 20 mg twice daily and creatinine impaired up to 1.14.  Lasix held on 01/12/2022.  Her right sacroiliac pain was better after 2 doses of Naprosyn on 01/11/2022.  01/13/2022  her creatinine was still impaired at 1.17. She was complaining of right heel pain consistent with plantars fasciitis.  She was orthostatic with occupational therapy with her blood pressure dropping down to 85/61 with standing.  01/14/2022.  Still orthostatic with nursing staff.  Another fluid bolus and midodrine started.  CT scan of the chest negative for pneumonia or pulmonary edema.  01/15/2022 and 01/16/2022.  Patient not feeling well with standing up and almost passed out.    Assessment and Plan: * Orthostatic hypotension Continue to hold Norvasc.  Increase midodrine to 5 mg in the morning and 2.5 mg twice a day after that. Patient felt like she was going to pass out with physical therapy with standing today.  Continue to monitor.  AKI (acute kidney injury) ([place]) Creatinine went from 0.66 up to 1.17.  Likely from overdiuresis.  Creatinine improved to 0.77  Plantar fasciitis of right foot Reviewed x-ray.  Unable to do steroids secondary to hallucinations.  With creatinine improved, continue Naprosyn once a day.  Heating pad, stretching, Tylenol.  Chronic diastolic CHF (congestive heart failure) (HCC) Ruled out  Pain of right sacroiliac joint This has resolved.  AF (paroxysmal atrial fibrillation) (HCC) Continue Eliquis.  HTN (hypertension) Patient with orthostatic hypotension.  Would like blood pressure on the higher side rather than too low.  Discontinued Norvasc.  Frequent falls Likely secondary to orthostatic hypotension.  Hold Norvasc.  Depression Bupropion 150 mg in AM, sertraline 50 mg daily  HLD (hyperlipidemia) Simvastatin 20 mg daily   GERD (gastroesophageal reflux disease) PPI  Vitamin B12 deficiency Continue supplementation  Subjective: Patient was unable to stand with the nursing staff today.  Stood briefly with the physical therapist but then she felt like she was going to pass out.  She felt dizzy.  Blood pressure did drop down but not a tremendous  amount.  Received midodrine this morning. Physical Exam: Vitals:   01/15/22 1731 01/15/22 1942 01/16/22 0438 01/16/22 0734  BP: (!) 167/73 (!) 182/82 (!) 155/62 118/72  Pulse: 70 61 (!) 51 60  Resp: '16 18 16 16  '$ Temp: 98 F (36.7 C) 97.8 F (36.6 C) 97.7 F (36.5 C) (!) 97.4 F (36.3 C)  TempSrc: Oral Oral Oral Oral  SpO2: 96% 98% 95% 96%  Weight:      Height:       Physical Exam HENT:     Head: Normocephalic.     Mouth/Throat:     Pharynx: No oropharyngeal exudate.  Eyes:     General: Lids are normal.     Conjunctiva/sclera: Conjunctivae normal.  Cardiovascular:     Rate and Rhythm: Normal rate and regular rhythm.     Heart sounds: Normal heart sounds, S1 normal and S2 normal.  Pulmonary:     Breath sounds: Examination of the right-lower field reveals decreased breath sounds. Examination of the left-lower field reveals decreased breath sounds. Decreased breath sounds present. No wheezing, rhonchi or rales.  Abdominal:     Palpations: Abdomen is soft.     Tenderness: There is no abdominal tenderness.  Musculoskeletal:     Right lower leg: No swelling.     Left lower leg: No swelling.     Comments: Right sacroiliac pain resolved.  Feet:     Comments: Last discomfort when palpating near the right heel. Skin:    General: Skin is warm.     Findings: No rash.  Neurological:     Mental Status: She is alert and oriented to person, place, and time.     Data Reviewed: EKG sinus bradycardia with sinus arrhythmia  Family Communication: Spoke with daughter at the bedside this morning and daughter-in-law on the phone this afternoon  Disposition: Status is: Inpatient Remains inpatient appropriate because: Symptomatic with standing today.  We will watch another day.  Continue midodrine.  Planned Discharge Destination: Rehab    Time spent: 28 minutes  Author: [name], MD 01/16/2022 2:34 PM  For on call review [email or web address].

## 2022-01-16 NOTE — TOC Progression Note (Signed)
Transition of Care [facility]) - Progression Note    Patient Details  Name: Caroline Aguilar MRN: [id number] Date of Birth: 09-May-1925  Transition of Care [facility]) CM/SW Contact  Beverly Sessions, RN Phone Number: 01/16/2022, 3:28 PM  Clinical Narrative:     Per MD not medically ready for dc today.  MD requests that I restart auth for potential dc tomorrow Auth started in navi portal [facility] updated        Expected Discharge Plan and Services                                                 Social Determinants of Health (SDOH) Interventions    Readmission Risk Interventions     No data to display

## 2022-01-16 NOTE — TOC Progression Note (Signed)
Transition of Care [facility]) - Progression Note    Patient Details  Name: Caroline Aguilar MRN: [id number] Date of Birth: 04/07/1926  Transition of Care [facility]) CM/SW Contact  Beverly Sessions, RN Phone Number: 01/16/2022, 3:28 PM  Clinical Narrative:     Per MD not medically ready for dc today.  MD requests that I restart auth for potential dc tomorrow Auth started in navi portal [facility] updated        Expected Discharge Plan and Services                                                 Social Determinants of Health (SDOH) Interventions    Readmission Risk Interventions     No data to display

## 2022-01-16 NOTE — TOC Initial Note (Signed)
Transition of Care [facility]) - Initial/Assessment Note    Patient Details  Name: Caroline Aguilar MRN: [id number] Date of Birth: 05/23/1925  Transition of Care [facility]) CM/SW Contact:    Beverly Sessions, RN Phone Number: 01/16/2022, 10:29 AM  Clinical Narrative:                  Insurance auth expires today.  MD notified         Patient Goals and CMS Choice        Expected Discharge Plan and Services                                                Prior Living Arrangements/Services                       Activities of Daily Living Home Assistive Devices/Equipment: Gilford Rile (specify type) ADL Screening (condition at time of admission) Patient's cognitive ability adequate to safely complete daily activities?: Yes Is the patient deaf or have difficulty hearing?: No Does the patient have difficulty seeing, even when wearing glasses/contacts?: No Does the patient have difficulty concentrating, remembering, or making decisions?: No Patient able to express need for assistance with ADLs?: Yes Does the patient have difficulty dressing or bathing?: No Independently performs ADLs?: Yes (appropriate for developmental age) Does the patient have difficulty walking or climbing stairs?: Yes Weakness of Legs: Both Weakness of Arms/Hands: None  Permission Sought/Granted                  Emotional Assessment              Admission diagnosis:  Weakness [R53.1] Frequent falls [R29.6] Unable to ambulate [R26.2] Pain of right lower extremity [M79.604] Patient Active Problem List   Diagnosis Date Noted   Orthostatic hypotension 01/13/2022   Plantar fasciitis of right foot 01/13/2022   AKI (acute kidney injury) ([place]) 01/12/2022   Pain of right sacroiliac joint 01/11/2022   Chronic diastolic CHF (congestive heart failure) ([place]) 01/11/2022   Vitamin B12 deficiency 01/11/2022   Frequent falls 01/09/2022   Depression 01/11/2021   Compression fracture of spine  ([place]) 01/11/2021   HTN (hypertension) 01/11/2021   Contusion of face 01/31/2020   Stroke ([place]) 06/11/2017   HLD (hyperlipidemia) 06/11/2017   GERD (gastroesophageal reflux disease) 06/11/2017   (HFpEF) heart failure with preserved ejection fraction ([place]) 06/11/2017   Dyspnea 07/15/2015   AF (paroxysmal atrial fibrillation) ([place]) 07/02/2015   PCP:  [name], MD Pharmacy:   [id number] [address], [address] AT [address] [place] [address] Phone: [phone] Fax: [phone]     Social Determinants of Health (SDOH) Interventions    Readmission Risk Interventions     No data to display

## 2022-01-16 NOTE — Progress Notes (Signed)
Physical Therapy Treatment Patient Details Name: Caroline Aguilar MRN: [id number] DOB: 1926/02/26 Today's Date: 01/16/2022   History of Present Illness Ms. Caroline Aguilar is a 88 year old female with history of hyperlipidemia, depression, anxiety, hypertension, atrial fibrillation, history of stroke, who presents emergency department for chief concerns of multiple falls and weakness.    PT Comments    Patient wakes easily to voice, does tend to fall back asleep when not spoken to. Session limited by pt symptoms, dizzy with all mobility attempts. BP taken in supported sitting (106/50, HR 56), unsupported sitting (96/57), and unable to obtain BP in standing. With a lot of encouragement pt able to stand and march but unable to stand >15 seconds. Returned to chair and supported sitting with symptoms resolving over time. BP at end of session 124/63. The patient would benefit from further skilled PT intervention to continue to progress towards goals. Recommendation remains appropriate.      Recommendations for follow up therapy are one component of a multi-disciplinary discharge planning process, led by the attending physician.  Recommendations may be updated based on patient status, additional functional criteria and insurance authorization.  Follow Up Recommendations  Skilled nursing-short term rehab (<3 hours/day) Can patient physically be transported by private vehicle: Yes   Assistance Recommended at Discharge Frequent or constant Supervision/Assistance  Patient can return home with the following A lot of help with walking and/or transfers;A lot of help with bathing/dressing/bathroom;Assist for transportation;Help with stairs or ramp for entrance   Equipment Recommendations  None recommended by PT    Recommendations for Other Services       Precautions / Restrictions Precautions Precautions: Fall Precaution Comments: HOH Restrictions Weight Bearing Restrictions: No     Mobility   Bed Mobility               General bed mobility comments: up in recliner at start and end of session    Transfers Overall transfer level: Needs assistance Equipment used: Rolling walker (2 wheels) Transfers: Sit to/from Stand Sit to Stand: Min assist                Ambulation/Gait               General Gait Details: unable due to symptoms (pt very dizzy)   Stairs             Wheelchair Mobility    Modified Rankin (Stroke Patients Only)       Balance Overall balance assessment: Needs assistance Sitting-balance support: No upper extremity supported, Feet unsupported Sitting balance-Leahy Scale: Good     Standing balance support: Bilateral upper extremity supported Standing balance-Leahy Scale: Poor                              Cognition Arousal/Alertness: Awake/alert Behavior During Therapy: WFL for tasks assessed/performed Overall Cognitive Status: Within Functional Limits for tasks assessed                                 General Comments: pt does drop off to sleep quickly when not spoken to        Exercises Other Exercises Other Exercises: pt educated on ted hose, changes in position, BLE exercises to encourage blood flow prior to mobility    General Comments        Pertinent Vitals/Pain Pain Assessment Pain Assessment: No/denies pain    Home  Living                          Prior Function            PT Goals (current goals can now be found in the care plan section) Progress towards PT goals: Progressing toward goals    Frequency    Min 2X/week      PT Plan Current plan remains appropriate    Co-evaluation              AM-PAC PT "6 Clicks" Mobility   Outcome Measure  Help needed turning from your back to your side while in a flat bed without using bedrails?: A Little Help needed moving from lying on your back to sitting on the side of a flat bed without using  bedrails?: A Little Help needed moving to and from a bed to a chair (including a wheelchair)?: A Lot Help needed standing up from a chair using your arms (e.g., wheelchair or bedside chair)?: A Lot Help needed to walk in hospital room?: A Lot Help needed climbing 3-5 steps with a railing? : Total 6 Click Score: 13    End of Session Equipment Utilized During Treatment: Gait belt Activity Tolerance: Other (comment) (limited by dizziness) Patient left: in chair;with call bell/phone within reach;with family/visitor present Nurse Communication: Mobility status (BP) PT Visit Diagnosis: Unsteadiness on feet (R26.81);Other abnormalities of gait and mobility (R26.89);Repeated falls (R29.6);Muscle weakness (generalized) (M62.81);History of falling (Z91.81);Difficulty in walking, not elsewhere classified (R26.2);Pain Pain - Right/Left: Right Pain - part of body: Hip;Leg     Time: 9163-8466 PT Time Calculation (min) (ACUTE ONLY): 19 min  Charges:  $Therapeutic Activity: 8-22 mins                     Lieutenant Diego PT, DPT 11:36 AM,01/16/22

## 2022-01-16 NOTE — Progress Notes (Signed)
Occupational Therapy Treatment Patient Details Name: Caroline Aguilar MRN: [id number] DOB: Mar 22, 1926 Today's Date: 01/16/2022   History of present illness Caroline Aguilar is a 88 year old female with history of hyperlipidemia, depression, anxiety, hypertension, atrial fibrillation, history of stroke, who presents emergency department for chief concerns of multiple falls and weakness.   OT comments  Caroline Aguilar was seen for OT treatment on this date. Upon arrival to room pt reclined in bed, agreeable to tx. Pt fitted for TED hose compression stockings. Pt requires increased time and close SBA to exit bed, endorses dizziness. MOD A + RW sit<>stand from bed pulling on RW, posterior lean. Improved to CGA + RW sit<>stand from chair with use of arm rest. Pt continues to be limited by dizziness however tolerated steps bed>chair and ~4 min static standing. Pt making progress toward goals, will continue to follow POC. Discharge recommendation remains appropriate.   Orthostatic Vitals  Lying: BP 114/62, HR 66 Sitting: BP 108/68, HR 63 Standing: BP 123/59, HR 70 +dizziness, pt gripping RW tightly  3 min Standing: BP 107/63, HR 73   Recommendations for follow up therapy are one component of a multi-disciplinary discharge planning process, led by the attending physician.  Recommendations may be updated based on patient status, additional functional criteria and insurance authorization.    Follow Up Recommendations  Skilled nursing-short term rehab (<3 hours/day)    Assistance Recommended at Discharge Frequent or constant Supervision/Assistance  Patient can return home with the following  A lot of help with walking and/or transfers;A lot of help with bathing/dressing/bathroom   Equipment Recommendations  BSC/3in1    Recommendations for Other Services      Precautions / Restrictions Precautions Precautions: Fall Precaution Comments: HOH Restrictions Weight Bearing Restrictions: No        Mobility Bed Mobility Overal bed mobility: Needs Assistance Bed Mobility: Supine to Sit Rolling: Min guard              Transfers Overall transfer level: Needs assistance Equipment used: Rolling walker (2 wheels) Transfers: Sit to/from Stand, Bed to chair/wheelchair/BSC Sit to Stand: Min guard     Step pivot transfers: Min assist     General transfer comment: MOD A to stand from bed pulling on RW, posterior lean. Improved to CGA + RW sit<>stand from chair with use of arm rest     Balance Overall balance assessment: Needs assistance Sitting-balance support: No upper extremity supported, Feet unsupported Sitting balance-Leahy Scale: Good     Standing balance support: Bilateral upper extremity supported Standing balance-Leahy Scale: Poor Standing balance comment: posteror lean                           ADL either performed or assessed with clinical judgement   ADL Overall ADL's : Needs assistance/impaired                                       General ADL Comments: MIN A + RW for simulated BSC t/f. MAX A don B socks at bed level      Cognition Arousal/Alertness: Awake/alert Behavior During Therapy: Anxious, WFL for tasks assessed/performed Overall Cognitive Status: Within Functional Limits for tasks assessed  Pertinent Vitals/ Pain       Pain Assessment Pain Assessment: Faces Faces Pain Scale: Hurts little more Pain Location: BLE donning compression socks Pain Descriptors / Indicators: Aching, Discomfort Pain Intervention(s): Limited activity within patient's tolerance, Repositioned   Frequency  Min 2X/week        Progress Toward Goals  OT Goals(current goals can now be found in the care plan section)  Progress towards OT goals: Progressing toward goals  Acute Rehab OT Goals Patient Stated Goal: to walk OT Goal Formulation: With patient Time For Goal  Achievement: 01/24/22 Potential to Achieve Goals: Good ADL Goals Pt Will Perform Grooming: with supervision;standing Pt Will Perform Lower Body Dressing: with min assist;sit to/from stand Pt Will Transfer to Toilet: with min guard assist;ambulating;bedside commode  Plan Discharge plan remains appropriate;Frequency remains appropriate    Co-evaluation                 AM-PAC OT "6 Clicks" Daily Activity     Outcome Measure   Help from another person eating meals?: None Help from another person taking care of personal grooming?: A Little Help from another person toileting, which includes using toliet, bedpan, or urinal?: A Lot Help from another person bathing (including washing, rinsing, drying)?: A Lot Help from another person to put on and taking off regular upper body clothing?: A Little Help from another person to put on and taking off regular lower body clothing?: A Lot 6 Click Score: 16    End of Session Equipment Utilized During Treatment: Rolling walker (2 wheels)  OT Visit Diagnosis: Other abnormalities of gait and mobility (R26.89);Muscle weakness (generalized) (M62.81)   Activity Tolerance Patient tolerated treatment well   Patient Left in chair;with call bell/phone within reach   Nurse Communication          Time: 5638-9373 OT Time Calculation (min): 26 min  Charges: OT General Charges $OT Visit: 1 Visit OT Treatments $Self Care/Home Management : 8-22 mins $Therapeutic Activity: 8-22 mins  Dessie Coma, M.S. OTR/L  01/16/22, 11:33 AM  ascom [phone]

## 2022-01-17 DIAGNOSIS — R531 Weakness: Secondary | ICD-10-CM | POA: Diagnosis not present

## 2022-01-17 DIAGNOSIS — N182 Chronic kidney disease, stage 2 (mild): Secondary | ICD-10-CM | POA: Diagnosis not present

## 2022-01-17 DIAGNOSIS — E785 Hyperlipidemia, unspecified: Secondary | ICD-10-CM | POA: Diagnosis not present

## 2022-01-17 DIAGNOSIS — W19XXXA Unspecified fall, initial encounter: Secondary | ICD-10-CM | POA: Diagnosis not present

## 2022-01-17 DIAGNOSIS — I951 Orthostatic hypotension: Secondary | ICD-10-CM | POA: Diagnosis not present

## 2022-01-17 DIAGNOSIS — M6281 Muscle weakness (generalized): Secondary | ICD-10-CM | POA: Diagnosis not present

## 2022-01-17 DIAGNOSIS — I5032 Chronic diastolic (congestive) heart failure: Secondary | ICD-10-CM | POA: Diagnosis not present

## 2022-01-17 DIAGNOSIS — M722 Plantar fascial fibromatosis: Secondary | ICD-10-CM | POA: Diagnosis not present

## 2022-01-17 DIAGNOSIS — N179 Acute kidney failure, unspecified: Secondary | ICD-10-CM | POA: Diagnosis not present

## 2022-01-17 DIAGNOSIS — E538 Deficiency of other specified B group vitamins: Secondary | ICD-10-CM | POA: Diagnosis not present

## 2022-01-17 DIAGNOSIS — Z79899 Other long term (current) drug therapy: Secondary | ICD-10-CM | POA: Diagnosis not present

## 2022-01-17 DIAGNOSIS — I480 Paroxysmal atrial fibrillation: Secondary | ICD-10-CM | POA: Diagnosis not present

## 2022-01-17 DIAGNOSIS — R69 Illness, unspecified: Secondary | ICD-10-CM | POA: Diagnosis not present

## 2022-01-17 DIAGNOSIS — Z7401 Bed confinement status: Secondary | ICD-10-CM | POA: Diagnosis not present

## 2022-01-17 DIAGNOSIS — I4891 Unspecified atrial fibrillation: Secondary | ICD-10-CM | POA: Diagnosis not present

## 2022-01-17 DIAGNOSIS — M533 Sacrococcygeal disorders, not elsewhere classified: Secondary | ICD-10-CM | POA: Diagnosis not present

## 2022-01-17 DIAGNOSIS — R051 Acute cough: Secondary | ICD-10-CM | POA: Diagnosis not present

## 2022-01-17 LAB — CBC
HCT: 42 % (ref 36.0–46.0)
Hemoglobin: 13.5 g/dL (ref 12.0–15.0)
MCH: 28.9 pg (ref 26.0–34.0)
MCHC: 32.1 g/dL (ref 30.0–36.0)
MCV: 89.9 fL (ref 80.0–100.0)
Platelets: 229 10*3/uL (ref 150–400)
RBC: 4.67 MIL/uL (ref 3.87–5.11)
RDW: 12 % (ref 11.5–15.5)
WBC: 8.3 10*3/uL (ref 4.0–10.5)
nRBC: 0 % (ref 0.0–0.2)

## 2022-01-17 LAB — BASIC METABOLIC PANEL
Anion gap: 6 (ref 5–15)
BUN: 28 mg/dL — ABNORMAL HIGH (ref 8–23)
CO2: 29 mmol/L (ref 22–32)
Calcium: 8.4 mg/dL — ABNORMAL LOW (ref 8.9–10.3)
Chloride: 99 mmol/L (ref 98–111)
Creatinine, Ser: 0.94 mg/dL (ref 0.44–1.00)
GFR, Estimated: 56 mL/min — ABNORMAL LOW (ref >60)
Glucose, Bld: 83 mg/dL (ref 70–99)
Potassium: 4.3 mmol/L (ref 3.5–5.1)
Sodium: 134 mmol/L — ABNORMAL LOW (ref 135–145)

## 2022-01-17 MED ORDER — LIDOCAINE 5 % EX PTCH: MEDICATED_PATCH | CUTANEOUS | 0 refills | Status: DC

## 2022-01-17 MED ORDER — MOMETASONE FURO-FORMOTEROL FUM 200-5 MCG/ACT IN AERO: 2.0000 | INHALATION_SPRAY | Freq: Two times a day (BID) | RESPIRATORY_TRACT | 0 refills | Status: AC

## 2022-01-17 MED ORDER — CYANOCOBALAMIN 1000 MCG PO TABS: 1000.0000 ug | ORAL_TABLET | Freq: Every day | ORAL | 0 refills | Status: AC

## 2022-01-17 MED ORDER — MIDODRINE HCL 2.5 MG PO TABS: 2.5000 mg | ORAL_TABLET | Freq: Two times a day (BID) | ORAL | 0 refills | Status: AC

## 2022-01-17 MED ORDER — MIDODRINE HCL 5 MG PO TABS: 5.0000 mg | ORAL_TABLET | Freq: Every day | ORAL | 0 refills | Status: AC

## 2022-01-17 NOTE — Discharge Summary (Signed)
Physician Discharge Summary   Patient: Caroline Aguilar MRN: [id number] DOB: 1926-04-02  Admit date:     01/09/2022  Discharge date: 01/17/22  Discharge Physician: [name]   PCP: [name], MD   Recommendations at discharge:   Follow-up team at rehab 1 day  Discharge Diagnoses: Principal Problem:   Orthostatic hypotension Active Problems:   AKI (acute kidney injury) (HCC)   AF (paroxysmal atrial fibrillation) (HCC)   Pain of right sacroiliac joint   Chronic diastolic CHF (congestive heart failure) (HCC)   Plantar fasciitis of right foot   HTN (hypertension)   Depression   Frequent falls   HLD (hyperlipidemia)   GERD (gastroesophageal reflux disease)   Vitamin B12 deficiency   Pain of right lower extremity    Hospital Course: Ms. Caroline Aguilar is a 88 year old female with history of hyperlipidemia, depression, anxiety, hypertension, atrial fibrillation, history of stroke, who presents emergency department for chief concerns of multiple falls and weakness, most of the symptoms are related when she was trying to stand up quickly from sitting position.  Also endorsed poor p.o. intake.  Having difficulty with performing her ADLs.  ROS positive for exertional dyspnea for about a year.  She lives alone, son and daughter-in-law who lives across the street take care of her.  Initial vitals in the ED showed temperature of 98, respiration rate of 19, heart rate of 65, blood pressure 96/55, SPO2 of 94% on room air.  Serum sodium is 134, potassium 4.9, chloride 100, bicarb 28, BUN of 41, serum creatinine 0.87, GFR greater than 60, nonfasting blood glucose 133, WBC 6.9, hemoglobin 12.6, platelets of 187.  COVID/influenza A/influenza B PCR were negative. UA was negative for leukocytes and nitrates.  BNP was 127.8  Portable chest x-ray, right ankle x-ray, right foot complete x-ray, right hip x-ray: Was read as negative for fracture.  ED treatment: None  9/26: Patient  again became weak and dizzy while getting out of bed, blood pressure dropped to 92/46 and she received 500 cc of bolus. Patient was only taking 2.5 mg of amlodipine daily per daughter.  Most likely dehydration secondary to poor p.o. intake. Orthostatic vital seems positive. PT/OT are recommending SNF-patient and family agrees. B12 at 196-starting supplement.  Patient short of breath on 01/11/2022 dependent low-dose Lasix 20 mg twice daily and creatinine impaired up to 1.14.  Lasix held on 01/12/2022.  Her right sacroiliac pain was better after 2 doses of Naprosyn on 01/11/2022.  01/13/2022 her creatinine was still impaired at 1.17. She was complaining of right heel pain consistent with plantars fasciitis.  She was orthostatic with occupational therapy with her blood pressure dropping down to 85/61 with standing.  01/14/2022.  Still orthostatic with nursing staff.  Another fluid bolus and midodrine started.  CT scan of the chest negative for pneumonia or pulmonary edema.  01/15/2022 and 01/16/2022.  Patient not feeling well with standing up and almost passed out.    01/17/2022.  Patient still orthostatic with standing.  Needs midodrine 5 mg in the morning and 2.5 mg with lunch and dinner.  Blood pressure still very variable but does drop with standing.  Deconditioning likely contributing.  Patient needs to eat and drink..  Assessment and Plan: * Orthostatic hypotension Continue to hold Norvasc.  Increase midodrine to 5 mg in the morning and 2.5 mg with lunch and dinner.  Blood pressure very variable.  Do not treat high blood pressure.  Always check orthostatics.  AKI (acute kidney injury) ([place])  Creatinine went from 0.66 up to 1.17.  Likely from overdiuresis.  Creatinine upon discharge 0.94. Recommend checking BMP one week.  Plantar fasciitis of right foot Reviewed x-ray.  Unable to do steroids secondary to hallucinations.  Heating pad, stretching, Tylenol.  Chronic diastolic CHF (congestive heart  failure) (HCC) Ruled out  Pain of right sacroiliac joint This has resolved.  AF (paroxysmal atrial fibrillation) (HCC) Continue Eliquis.  HTN (hypertension) Patient with orthostatic hypotension.  Would like blood pressure on the higher side rather than too low.  Discontinued Norvasc.  Do not treat hypertension.  Always check orthostatics.  Frequent falls Likely secondary to orthostatic hypotension.  Hold Norvasc.  Depression Bupropion 150 mg in AM, sertraline 50 mg daily  HLD (hyperlipidemia) Simvastatin 20 mg daily   GERD (gastroesophageal reflux disease) PPI  Vitamin B12 deficiency Continue supplementation         Consultants: None Procedures performed: None Disposition: Rehabilitation facility Diet recommendation:  Cardiac diet DISCHARGE MEDICATION: Allergies as of 01/17/2022       Reactions   Penicillins Anaphylaxis, Other (See Comments)   Has patient had a PCN reaction causing immediate rash, facial/tongue/throat swelling, SOB or lightheadedness with hypotension: Yes Has patient had a PCN reaction causing severe rash involving mucus membranes or skin necrosis: No Has patient had a PCN reaction that required hospitalization No Has patient had a PCN reaction occurring within the last 10 years: No If all of the above answers are "NO", then may proceed with Cephalosporin use.   Penicillins Anaphylaxis   Meloxicam Other (See Comments)   Increased risk of bleeding with apixaban, per son   Azithromycin Other (See Comments)   Reaction:  Unknown    Cortisone    Other reaction(s): Hallucination   Famciclovir Other (See Comments)   Reaction:  Unknown    Morphine    Streptomycin    Tramadol    Other reaction(s): Hallucination   Venlafaxine    Other reaction(s): Dizziness        Medication List     STOP taking these medications    acetaminophen 500 MG tablet Commonly known as: TYLENOL   Calcium Carbonate-Vitamin D 600-400 MG-UNIT tablet   Combigan  0.2-0.5 % ophthalmic solution Generic drug: brimonidine-timolol   oxyCODONE 5 MG immediate release tablet Commonly known as: Oxy IR/ROXICODONE   predniSONE 20 MG tablet Commonly known as: DELTASONE   timolol 0.5 % ophthalmic solution Commonly known as: TIMOPTIC       TAKE these medications    apixaban 2.5 MG Tabs tablet Commonly known as: ELIQUIS Take 2.5 mg by mouth 2 (two) times daily.   buPROPion 150 MG 24 hr tablet Commonly known as: WELLBUTRIN XL Take 150 mg by mouth every morning.   cyanocobalamin 1000 MCG tablet Take 1 tablet (1,000 mcg total) by mouth daily. Start taking on: January 18, 2022   docusate sodium 100 MG capsule Commonly known as: COLACE Take 100 mg by mouth daily.   gabapentin 100 MG capsule Commonly known as: NEURONTIN Take 100 mg by mouth at bedtime.   latanoprost 0.005 % ophthalmic solution Commonly known as: XALATAN Place 1 drop into both eyes at bedtime.   lidocaine 5 % Commonly known as: LIDODERM Remove & Discard patch within 12 hours or as directed by MD.  Apply to area of pain What changed:  how much to take how to take this when to take this additional instructions   midodrine 2.5 MG tablet Commonly known as: PROAMATINE Take 1 tablet (2.5 mg  total) by mouth 2 (two) times daily with a meal.   midodrine 5 MG tablet Commonly known as: PROAMATINE Take 1 tablet (5 mg total) by mouth daily with breakfast. Start taking on: January 18, 2022   mometasone-formoterol 200-5 MCG/ACT Aero Commonly known as: DULERA Inhale 2 puffs into the lungs 2 (two) times daily.   omeprazole 20 MG capsule Commonly known as: PRILOSEC Take 20 mg by mouth at bedtime.   sertraline 50 MG tablet Commonly known as: ZOLOFT Take 50 mg by mouth every morning.   simvastatin 20 MG tablet Commonly known as: ZOCOR Take 20 mg by mouth daily at 6 PM.        Contact information for after-discharge care     Destination     [facility]  Preferred SNF .   Service: Skilled Nursing Contact information: [address] [place] [phone]                    Discharge Exam: Danley Danker Weights   01/09/22 1402  Weight: 65.8 kg   Physical Exam HENT:     Head: Normocephalic.     Mouth/Throat:     Pharynx: No oropharyngeal exudate.  Eyes:     General: Lids are normal.     Conjunctiva/sclera: Conjunctivae normal.  Cardiovascular:     Rate and Rhythm: Normal rate and regular rhythm.     Heart sounds: Normal heart sounds, S1 normal and S2 normal.  Pulmonary:     Breath sounds: Examination of the right-lower field reveals decreased breath sounds. Examination of the left-lower field reveals decreased breath sounds. Decreased breath sounds present. No wheezing, rhonchi or rales.  Abdominal:     Palpations: Abdomen is soft.     Tenderness: There is no abdominal tenderness.  Musculoskeletal:     Right lower leg: No swelling.     Left lower leg: No swelling.     Comments: Right sacroiliac pain resolved.  Feet:     Comments: Last discomfort when palpating near the right heel. Skin:    General: Skin is warm.     Findings: No rash.  Neurological:     Mental Status: She is alert and oriented to person, place, and time.      Condition at discharge: stable  The results of significant diagnostics from this hospitalization (including imaging, microbiology, ancillary and laboratory) are listed below for reference.   Imaging Studies: CT CHEST WO CONTRAST  Result Date: 01/14/2022 CLINICAL DATA:  Dyspnea. EXAM: CT CHEST WITHOUT CONTRAST TECHNIQUE: Multidetector CT imaging of the chest was performed following the standard protocol without IV contrast. RADIATION DOSE REDUCTION: This exam was performed according to the departmental dose-optimization program which includes automated exposure control, adjustment of the mA and/or kV according to patient size and/or use of iterative reconstruction technique.  COMPARISON:  Chest radiographs, 01/09/2022.  CT, 06/30/2020. FINDINGS: Cardiovascular: Heart normal in size. Stable prosthetic aortic valve. Ascending thoracic aorta measures 4.8 cm, without significant change from the prior study. Aortic atherosclerosis. Mediastinum/Nodes: Normal thyroid. No neck base, mediastinal or hilar masses or enlarged lymph nodes. Esophagus mildly distended with fluid. Trachea unremarkable. Lungs/Pleura: Mild medial right middle lobe opacity and dependent opacity in the left upper lobe lingula, stable consistent with chronic atelectasis or scarring. Mild linear atelectasis at both dependent lung bases. Minor pleuroparenchymal scarring at the apices. No evidence of pneumonia or pulmonary edema. No lung mass or suspicious nodule. No pleural effusion or pneumothorax. Upper Abdomen: No acute  abnormality. Musculoskeletal: Multiple vertebral compression fractures. Mild to moderate compression fracture of T2, new since the prior CT. Similar appearing compression fracture of T3, chronic and stable. Mild depression of the upper endplates of T5 and T6, stable. Mild compression fracture of T12, stable. Prior fractures of L1 and L2 treated with vertebroplasty. Old right rib fractures. No bone lesions. No chest wall mass. IMPRESSION: 1. No acute findings.  No evidence of pneumonia or pulmonary edema. 2. Dilated ascending thoracic aorta, 4.8 cm, unchanged from the 06/30/2020 exam allowing for slight differences in measurement technique. Ascending thoracic aortic aneurysm. Recommend semi-annual imaging followup by CTA or MRA and referral to cardiothoracic surgery if not already obtained. This recommendation follows 2010 ACCF/AHA/AATS/ACR/ASA/SCA/SCAI/SIR/STS/SVM Guidelines for the Diagnosis and Management of Patients With Thoracic Aortic Disease. Circulation. 2010; 121: O242-P536. Aortic aneurysm NOS (ICD10-I71.9) 3. Coronary artery calcifications. 4. Vertebral compression fractures, T2 new since the prior  CT, but appearing chronic. Aortic Atherosclerosis (ICD10-I70.0). Electronically Signed   By: Lajean Manes M.D.   On: 01/14/2022 14:19   ECHOCARDIOGRAM COMPLETE  Result Date: 01/12/2022    ECHOCARDIOGRAM REPORT   Patient Name:   EARNESTEEN BIRNIE Date of Exam: 01/11/2022 Medical Rec #:  [id number]         Height:       60.0 in Accession #:    [id number]        Weight:       145.0 lb Date of Birth:  1925-08-08          BSA:          1.628 m Patient Age:    89 years          BP:           136/83 mmHg Patient Gender: F                 HR:           61 bpm. Exam Location:  [facility] Procedure: 2D Echo, Cardiac Doppler and Color Doppler Indications:     O67.12 Acute Diastolic Heart Failure  History:         Patient has prior history of Echocardiogram examinations, most                  recent 06/12/2017. Arrythmias:Atrial Fibrillation; Risk                  Factors:Dyslipidemia.  Sonographer:     Cresenciano Lick RDCS Referring Phys:  [id number] [name] Diagnosing Phys: Yolonda Kida MD IMPRESSIONS  1. Left ventricular ejection fraction, by estimation, is 55 to 60%. The left ventricle has normal function. The left ventricle has no regional wall motion abnormalities. Left ventricular diastolic parameters are consistent with Grade I diastolic dysfunction (impaired relaxation).  2. Right ventricular systolic function is normal. The right ventricular size is normal.  3. The mitral valve is normal in structure. Trivial mitral valve regurgitation.  4. The aortic valve is normal in structure. Aortic valve regurgitation is not visualized. FINDINGS  Left Ventricle: Left ventricular ejection fraction, by estimation, is 55 to 60%. The left ventricle has normal function. The left ventricle has no regional wall motion abnormalities. The left ventricular internal cavity size was normal in size. There is  no left ventricular hypertrophy. Left ventricular diastolic parameters are consistent with Grade I diastolic dysfunction  (impaired relaxation). Right Ventricle: The right ventricular size is normal. No increase in right ventricular wall thickness. Right ventricular systolic function is normal. Left  Atrium: Left atrial size was normal in size. Right Atrium: Right atrial size was not assessed. Pericardium: There is no evidence of pericardial effusion. Mitral Valve: The mitral valve is normal in structure. Trivial mitral valve regurgitation. Tricuspid Valve: The tricuspid valve is normal in structure. Tricuspid valve regurgitation is trivial. Aortic Valve: The aortic valve is normal in structure. Aortic valve regurgitation is not visualized. Aortic valve mean gradient measures 4.5 mmHg. Aortic valve peak gradient measures 8.4 mmHg. Pulmonic Valve: The pulmonic valve was normal in structure. Pulmonic valve regurgitation is not visualized. Aorta: The ascending aorta was not well visualized. IAS/Shunts: No atrial level shunt detected by color flow Doppler.  LEFT VENTRICLE PLAX 2D LVIDd:         3.50 cm Diastology LVIDs:         2.50 cm LV e' medial:    7.29 cm/s LV PW:         0.90 cm LV E/e' medial:  7.9 LV IVS:        0.80 cm LV e' lateral:   6.42 cm/s                        LV E/e' lateral: 9.0  RIGHT VENTRICLE             IVC RV Basal diam:  3.50 cm     IVC diam: 1.60 cm RV S prime:     10.60 cm/s TAPSE (M-mode): 1.3 cm LEFT ATRIUM             Index        RIGHT ATRIUM           Index LA diam:        3.60 cm 2.21 cm/m   RA Area:     13.70 cm LA Vol (A2C):   28.2 ml 17.32 ml/m  RA Volume:   35.10 ml  21.56 ml/m LA Vol (A4C):   35.3 ml 21.68 ml/m LA Biplane Vol: 31.8 ml 19.53 ml/m  AORTIC VALVE AV Vmax:           145.00 cm/s AV Vmean:          98.700 cm/s AV VTI:            0.327 m AV Peak Grad:      8.4 mmHg AV Mean Grad:      4.5 mmHg LVOT Vmax:         79.90 cm/s LVOT Vmean:        52.400 cm/s LVOT VTI:          0.186 m LVOT/AV VTI ratio: 0.57  AORTA Ao Root diam: 2.50 cm MITRAL VALVE               TRICUSPID VALVE MV Area (PHT):  2.66 cm    TR Peak grad:   42.8 mmHg MV Decel Time: 285 msec    TR Vmax:        327.00 cm/s MV E velocity: 57.60 cm/s MV A velocity: 86.50 cm/s  SHUNTS MV E/A ratio:  0.67        Systemic VTI: 0.19 m Yolonda Kida MD Electronically signed by Yolonda Kida MD Signature Date/Time: 01/12/2022/5:31:39 PM    Final    DG Hip Unilat W or Wo Pelvis 2-3 Views Right  Result Date: 01/09/2022 CLINICAL DATA:  Hip pain after fall EXAM: DG HIP (WITH OR WITHOUT PELVIS) 2-3V RIGHT COMPARISON:  None FINDINGS: Remote posttraumatic and postsurgical  changes noted in the hips bilaterally. Degenerative changes with joint space narrowing and spurring. No acute fracture, subluxation or dislocation. Diffuse vascular calcifications. IMPRESSION: No acute bony abnormality. Electronically Signed   By: Rolm Baptise M.D.   On: 01/09/2022 17:02   DG Ankle Complete Right  Result Date: 01/09/2022 CLINICAL DATA:  pain post fall EXAM: RIGHT ANKLE - COMPLETE 3+ VIEW; RIGHT FOOT COMPLETE - 3+ VIEW COMPARISON:  Foot radiograph February 2012. FINDINGS: Right ankle: Diffuse osteopenia. There is no evidence of acute fracture. Alignment is normal. There is mild tibiotalar and subtalar degenerative change. Mild midfoot degenerative change. Tiny plantar and dorsal calcaneal spurs. There is a soft tissue swelling. Right left foot: Diffuse osteopenia. There is no evidence of acute fracture. There is a chronic posttraumatic deformity of the fifth metatarsal and potentially of the fourth and fifth toe proximal phalanges. There is soft tissue swelling of the foot. There is lucency in the medial navicular favored to reflect subchondral cystic change, and similar to prior exam in February 2012. Adjacent os navicularis. There is mild first MTP and mild-to-moderate diffuse interphalangeal joint osteoarthritis. Mild midfoot degenerative change. IMPRESSION: Soft tissue swelling without evidence of acute fracture in the right ankle or foot. Electronically  Signed   By: Maurine Simmering M.D.   On: 01/09/2022 15:17   DG Foot Complete Right  Result Date: 01/09/2022 CLINICAL DATA:  pain post fall EXAM: RIGHT ANKLE - COMPLETE 3+ VIEW; RIGHT FOOT COMPLETE - 3+ VIEW COMPARISON:  Foot radiograph February 2012. FINDINGS: Right ankle: Diffuse osteopenia. There is no evidence of acute fracture. Alignment is normal. There is mild tibiotalar and subtalar degenerative change. Mild midfoot degenerative change. Tiny plantar and dorsal calcaneal spurs. There is a soft tissue swelling. Right left foot: Diffuse osteopenia. There is no evidence of acute fracture. There is a chronic posttraumatic deformity of the fifth metatarsal and potentially of the fourth and fifth toe proximal phalanges. There is soft tissue swelling of the foot. There is lucency in the medial navicular favored to reflect subchondral cystic change, and similar to prior exam in February 2012. Adjacent os navicularis. There is mild first MTP and mild-to-moderate diffuse interphalangeal joint osteoarthritis. Mild midfoot degenerative change. IMPRESSION: Soft tissue swelling without evidence of acute fracture in the right ankle or foot. Electronically Signed   By: Maurine Simmering M.D.   On: 01/09/2022 15:17   DG Chest 1 View  Result Date: 01/09/2022 CLINICAL DATA:  Shortness of breath EXAM: CHEST  1 VIEW COMPARISON:  Radiograph 01/11/2021 FINDINGS: Cardiomediastinal silhouette is unchanged with prior TAVR and aortic arch calcifications. Moderate central opacities, similar to prior exams. No large pleural effusion. There is no evidence of pneumothorax. There are chronic left lateral rib deformities. There is severe degenerative change of the shoulders. Thoracic spondylosis with similar compression deformities. IMPRESSION: Mild interstitial opacities could reflect a degree of interstitial edema or chronic interstitial change. No focal airspace consolidation. Electronically Signed   By: Maurine Simmering M.D.   On: 01/09/2022  15:11    Microbiology: Results for orders placed or performed during the hospital encounter of 01/09/22  Resp Panel by RT-PCR (Flu A&B, Covid) Anterior Nasal Swab     Status: None   Collection Time: 01/09/22  4:22 PM   Specimen: Anterior Nasal Swab  Result Value Ref Range Status   SARS Coronavirus 2 by RT PCR NEGATIVE NEGATIVE Final    Comment: (NOTE) SARS-CoV-2 target nucleic acids are NOT DETECTED.  The SARS-CoV-2 RNA is generally detectable in  upper respiratory specimens during the acute phase of infection. The lowest concentration of SARS-CoV-2 viral copies this assay can detect is 138 copies/mL. A negative result does not preclude SARS-Cov-2 infection and should not be used as the sole basis for treatment or other patient management decisions. A negative result may occur with  improper specimen collection/handling, submission of specimen other than nasopharyngeal swab, presence of viral mutation(s) within the areas targeted by this assay, and inadequate number of viral copies(<138 copies/mL). A negative result must be combined with clinical observations, patient history, and epidemiological information. The expected result is Negative.  Fact Sheet for Patients:  EntrepreneurPulse.com.au  Fact Sheet for Healthcare Providers:  IncredibleEmployment.be  This test is no t yet approved or cleared by the [place] FDA and  has been authorized for detection and/or diagnosis of SARS-CoV-2 by FDA under an Emergency Use Authorization (EUA). This EUA will remain  in effect (meaning this test can be used) for the duration of the COVID-19 declaration under Section 564(b)(1) of the Act, 21 U.S.C.section 360bbb-3(b)(1), unless the authorization is terminated  or revoked sooner.       Influenza A by PCR NEGATIVE NEGATIVE Final   Influenza B by PCR NEGATIVE NEGATIVE Final    Comment: (NOTE) The Xpert Xpress SARS-CoV-2/FLU/RSV plus assay is  intended as an aid in the diagnosis of influenza from Nasopharyngeal swab specimens and should not be used as a sole basis for treatment. Nasal washings and aspirates are unacceptable for Xpert Xpress SARS-CoV-2/FLU/RSV testing.  Fact Sheet for Patients: EntrepreneurPulse.com.au  Fact Sheet for Healthcare Providers: IncredibleEmployment.be  This test is not yet approved or cleared by the [place] FDA and has been authorized for detection and/or diagnosis of SARS-CoV-2 by FDA under an Emergency Use Authorization (EUA). This EUA will remain in effect (meaning this test can be used) for the duration of the COVID-19 declaration under Section 564(b)(1) of the Act, 21 U.S.C. section 360bbb-3(b)(1), unless the authorization is terminated or revoked.  Performed at [facility], [address] [place], [place] [address]     Labs: CBC: Recent Labs  Lab 01/15/22 0622 01/17/22 0444  WBC 7.2 8.3  HGB 13.6 13.5  HCT 42.3 42.0  MCV 92.2 89.9  PLT 233 660   Basic Metabolic Panel: Recent Labs  Lab 01/12/22 0525 01/13/22 0809 01/14/22 0843 01/15/22 0622 01/17/22 0444  NA 136 134* 136 138 134*  K 4.1 4.1 4.3 4.5 4.3  CL 101 101 100 106 99  CO2 '28 27 29 28 29  '$ GLUCOSE 87 83 122* 86 83  BUN 39* 44* 35* 30* 28*  CREATININE 1.14* 1.17* 0.91 0.77 0.94  CALCIUM 8.3* 8.4* 8.7* 8.6* 8.4*    CBG: Recent Labs  Lab 01/16/22 0917  GLUCAP 105*    Discharge time spent: greater than 30 minutes.  Signed: [name], MD [facility] 01/17/2022

## 2022-01-17 NOTE — TOC Transition Note (Signed)
Transition of Care [facility]) - CM/SW Discharge Note   Patient Details  Name: Caroline Aguilar MRN: [id number] Date of Birth: [birth year]  [facility] CM/SW Contact:  Beverly Sessions, RN Phone Number: 01/17/2022, 11:33 AM   Clinical Narrative:      Patient will DC to: [facility] Anticipated DC date: 01/17/22  Family notified:son Transport GN:OIBBC  Per MD patient ready for DC to . RN, patient's family, and facility notified of DC. Discharge Summary sent to facility. RN given number for report. DC packet on chart. Ambulance transport requested for patient.  TOC signing off.  [facility] [phone]        Patient Goals and CMS Choice        Discharge Placement                       Discharge Plan and Services                                     Social Determinants of Health (SDOH) Interventions     Readmission Risk Interventions     No data to display

## 2022-01-17 NOTE — Progress Notes (Signed)
Caroline Aguilar to be D/C'd Skilled nursing facility per MD order.  Discussed prescriptions and follow up appointments with the patient. Prescriptions given to patient, medication list explained in detail. Pt verbalized understanding.  Allergies as of 01/17/2022       Reactions   Penicillins Anaphylaxis, Other (See Comments)   Has patient had a PCN reaction causing immediate rash, facial/tongue/throat swelling, SOB or lightheadedness with hypotension: Yes Has patient had a PCN reaction causing severe rash involving mucus membranes or skin necrosis: No Has patient had a PCN reaction that required hospitalization No Has patient had a PCN reaction occurring within the last 10 years: No If all of the above answers are "NO", then may proceed with Cephalosporin use.   Penicillins Anaphylaxis   Meloxicam Other (See Comments)   Increased risk of bleeding with apixaban, per son   Azithromycin Other (See Comments)   Reaction:  Unknown    Cortisone    Other reaction(s): Hallucination   Famciclovir Other (See Comments)   Reaction:  Unknown    Morphine    Streptomycin    Tramadol    Other reaction(s): Hallucination   Venlafaxine    Other reaction(s): Dizziness        Medication List     STOP taking these medications    acetaminophen 500 MG tablet Commonly known as: TYLENOL   Calcium Carbonate-Vitamin D 600-400 MG-UNIT tablet   Combigan 0.2-0.5 % ophthalmic solution Generic drug: brimonidine-timolol   oxyCODONE 5 MG immediate release tablet Commonly known as: Oxy IR/ROXICODONE   predniSONE 20 MG tablet Commonly known as: DELTASONE   timolol 0.5 % ophthalmic solution Commonly known as: TIMOPTIC       TAKE these medications    apixaban 2.5 MG Tabs tablet Commonly known as: ELIQUIS Take 2.5 mg by mouth 2 (two) times daily.   buPROPion 150 MG 24 hr tablet Commonly known as: WELLBUTRIN XL Take 150 mg by mouth every morning.   cyanocobalamin 1000 MCG tablet Take 1  tablet (1,000 mcg total) by mouth daily. Start taking on: January 18, 2022   docusate sodium 100 MG capsule Commonly known as: COLACE Take 100 mg by mouth daily.   gabapentin 100 MG capsule Commonly known as: NEURONTIN Take 100 mg by mouth at bedtime.   latanoprost 0.005 % ophthalmic solution Commonly known as: XALATAN Place 1 drop into both eyes at bedtime.   lidocaine 5 % Commonly known as: LIDODERM Remove & Discard patch within 12 hours or as directed by MD.  Apply to area of pain What changed:  how much to take how to take this when to take this additional instructions   midodrine 2.5 MG tablet Commonly known as: PROAMATINE Take 1 tablet (2.5 mg total) by mouth 2 (two) times daily with a meal.   midodrine 5 MG tablet Commonly known as: PROAMATINE Take 1 tablet (5 mg total) by mouth daily with breakfast. Start taking on: January 18, 2022   mometasone-formoterol 200-5 MCG/ACT Aero Commonly known as: DULERA Inhale 2 puffs into the lungs 2 (two) times daily.   omeprazole 20 MG capsule Commonly known as: PRILOSEC Take 20 mg by mouth at bedtime.   sertraline 50 MG tablet Commonly known as: ZOLOFT Take 50 mg by mouth every morning.   simvastatin 20 MG tablet Commonly known as: ZOCOR Take 20 mg by mouth daily at 6 PM.        Vitals:   01/17/22 0803 01/17/22 1141  BP: (!) 200/79 130/64  Pulse: Marland Kitchen)  57 (!) 53  Resp: 18   Temp: 97.7 F (36.5 C) (!) 97.5 F (36.4 C)  SpO2: 97% 97%    Skin clean, dry and intact without evidence of skin break down, no evidence of skin tears noted. IV catheter discontinued intact. Site without signs and symptoms of complications. Dressing and pressure applied. Pt denies pain at this time. No complaints noted.  An After Visit Summary was printed and given to the patient. Patient escorted via [place], and D/C home via private auto.  [place] C. Deatra Ina

## 2022-01-18 DIAGNOSIS — M722 Plantar fascial fibromatosis: Secondary | ICD-10-CM | POA: Diagnosis not present

## 2022-01-18 DIAGNOSIS — N179 Acute kidney failure, unspecified: Secondary | ICD-10-CM | POA: Diagnosis not present

## 2022-01-18 DIAGNOSIS — M6281 Muscle weakness (generalized): Secondary | ICD-10-CM | POA: Diagnosis not present

## 2022-01-18 DIAGNOSIS — I951 Orthostatic hypotension: Secondary | ICD-10-CM | POA: Diagnosis not present

## 2022-01-19 DIAGNOSIS — I4891 Unspecified atrial fibrillation: Secondary | ICD-10-CM | POA: Diagnosis not present

## 2022-01-19 DIAGNOSIS — I951 Orthostatic hypotension: Secondary | ICD-10-CM | POA: Diagnosis not present

## 2022-01-19 DIAGNOSIS — M6281 Muscle weakness (generalized): Secondary | ICD-10-CM | POA: Diagnosis not present

## 2022-01-19 DIAGNOSIS — M722 Plantar fascial fibromatosis: Secondary | ICD-10-CM | POA: Diagnosis not present

## 2022-01-20 DIAGNOSIS — Z79899 Other long term (current) drug therapy: Secondary | ICD-10-CM | POA: Diagnosis not present

## 2022-01-24 DIAGNOSIS — N182 Chronic kidney disease, stage 2 (mild): Secondary | ICD-10-CM | POA: Diagnosis not present

## 2022-01-24 DIAGNOSIS — I951 Orthostatic hypotension: Secondary | ICD-10-CM | POA: Diagnosis not present

## 2022-01-24 DIAGNOSIS — I4891 Unspecified atrial fibrillation: Secondary | ICD-10-CM | POA: Diagnosis not present

## 2022-01-26 DIAGNOSIS — I951 Orthostatic hypotension: Secondary | ICD-10-CM | POA: Diagnosis not present

## 2022-01-26 DIAGNOSIS — M6281 Muscle weakness (generalized): Secondary | ICD-10-CM | POA: Diagnosis not present

## 2022-01-26 DIAGNOSIS — I480 Paroxysmal atrial fibrillation: Secondary | ICD-10-CM | POA: Diagnosis not present

## 2022-01-26 DIAGNOSIS — N182 Chronic kidney disease, stage 2 (mild): Secondary | ICD-10-CM | POA: Diagnosis not present

## 2022-01-31 DIAGNOSIS — M6281 Muscle weakness (generalized): Secondary | ICD-10-CM | POA: Diagnosis not present

## 2022-01-31 DIAGNOSIS — I951 Orthostatic hypotension: Secondary | ICD-10-CM | POA: Diagnosis not present

## 2022-01-31 DIAGNOSIS — N182 Chronic kidney disease, stage 2 (mild): Secondary | ICD-10-CM | POA: Diagnosis not present

## 2022-01-31 DIAGNOSIS — I480 Paroxysmal atrial fibrillation: Secondary | ICD-10-CM | POA: Diagnosis not present

## 2022-02-09 DIAGNOSIS — Z0000 Encounter for general adult medical examination without abnormal findings: Secondary | ICD-10-CM | POA: Diagnosis not present

## 2022-02-09 DIAGNOSIS — Z1331 Encounter for screening for depression: Secondary | ICD-10-CM | POA: Diagnosis not present

## 2022-02-09 DIAGNOSIS — J40 Bronchitis, not specified as acute or chronic: Secondary | ICD-10-CM | POA: Diagnosis not present

## 2022-02-09 DIAGNOSIS — Z79899 Other long term (current) drug therapy: Secondary | ICD-10-CM | POA: Diagnosis not present

## 2022-02-09 DIAGNOSIS — R42 Dizziness and giddiness: Secondary | ICD-10-CM | POA: Diagnosis not present

## 2022-02-09 DIAGNOSIS — M79671 Pain in right foot: Secondary | ICD-10-CM | POA: Diagnosis not present

## 2022-02-09 DIAGNOSIS — G620 Drug-induced polyneuropathy: Secondary | ICD-10-CM | POA: Diagnosis not present

## 2022-02-16 DIAGNOSIS — I2510 Atherosclerotic heart disease of native coronary artery without angina pectoris: Secondary | ICD-10-CM | POA: Diagnosis not present

## 2022-02-16 DIAGNOSIS — G629 Polyneuropathy, unspecified: Secondary | ICD-10-CM | POA: Diagnosis not present

## 2022-02-16 DIAGNOSIS — I951 Orthostatic hypotension: Secondary | ICD-10-CM | POA: Diagnosis not present

## 2022-02-16 DIAGNOSIS — I129 Hypertensive chronic kidney disease with stage 1 through stage 4 chronic kidney disease, or unspecified chronic kidney disease: Secondary | ICD-10-CM | POA: Diagnosis not present

## 2022-02-16 DIAGNOSIS — N182 Chronic kidney disease, stage 2 (mild): Secondary | ICD-10-CM | POA: Diagnosis not present

## 2022-02-16 DIAGNOSIS — I480 Paroxysmal atrial fibrillation: Secondary | ICD-10-CM | POA: Diagnosis not present

## 2022-02-16 DIAGNOSIS — J45909 Unspecified asthma, uncomplicated: Secondary | ICD-10-CM | POA: Diagnosis not present

## 2022-02-22 DIAGNOSIS — Z79899 Other long term (current) drug therapy: Secondary | ICD-10-CM | POA: Diagnosis not present

## 2022-02-28 ENCOUNTER — Emergency Department: Payer: Medicare HMO

## 2022-02-28 ENCOUNTER — Encounter: Payer: Self-pay | Admitting: Emergency Medicine

## 2022-02-28 ENCOUNTER — Emergency Department
Admission: EM | Admit: 2022-02-28 | Discharge: 2022-02-28 | Disposition: A | Discharge status: Home / Self Care | Payer: Medicare HMO | Attending: Emergency Medicine | Admitting: Emergency Medicine

## 2022-02-28 ENCOUNTER — Other Ambulatory Visit: Payer: Self-pay

## 2022-02-28 DIAGNOSIS — T68XXXA Hypothermia, initial encounter: Secondary | ICD-10-CM | POA: Diagnosis not present

## 2022-02-28 DIAGNOSIS — M25571 Pain in right ankle and joints of right foot: Secondary | ICD-10-CM | POA: Insufficient documentation

## 2022-02-28 DIAGNOSIS — I959 Hypotension, unspecified: Secondary | ICD-10-CM | POA: Diagnosis not present

## 2022-02-28 DIAGNOSIS — M85871 Other specified disorders of bone density and structure, right ankle and foot: Secondary | ICD-10-CM | POA: Diagnosis not present

## 2022-02-28 MED ORDER — OXYCODONE-ACETAMINOPHEN 5-325 MG PO TABS: 1.0000 | ORAL_TABLET | Freq: Four times a day (QID) | ORAL | 0 refills | Status: DC | PRN

## 2022-02-28 MED ORDER — PREDNISONE 20 MG PO TABS
10.0000 mg | ORAL_TABLET | Freq: Once | ORAL | Status: AC
  Administered 2022-02-28: 10 mg via ORAL
  Filled 2022-02-28: qty 1

## 2022-02-28 MED ORDER — CELECOXIB 100 MG PO CAPS: 100.0000 mg | ORAL_CAPSULE | Freq: Two times a day (BID) | ORAL | 0 refills | Status: DC

## 2022-02-28 MED ORDER — OXYCODONE-ACETAMINOPHEN 5-325 MG PO TABS
1.0000 | ORAL_TABLET | Freq: Once | ORAL | Status: AC
  Administered 2022-02-28: 1 via ORAL
  Filled 2022-02-28: qty 1

## 2022-02-28 MED ORDER — PREDNISONE 10 MG PO TABS: 10.0000 mg | ORAL_TABLET | Freq: Every day | ORAL | 0 refills | Status: AC

## 2022-02-28 NOTE — ED Triage Notes (Signed)
BIB EMS from home. Was doing her home PT exercises and felt a "pop" in her right ankle with pain. No fall. No obvious deformity or new swelling.

## 2022-02-28 NOTE — ED Provider Notes (Signed)
[facility] Provider Note  Patient Contact: 7:36 PM (approximate)   History   Ankle Pain   HPI  Caroline Aguilar is a 88 y.o. female who presents emergency department with her granddaughter for complaint of ankle pain.  Patient has had difficulties with her ankle since September.  Was seen in this department with negative imaging.  Patient had been complaining of ongoing heel pain and ankle pain.  Has had some tendinitis, had decreased range of motion has been going through extensive physical therapy.  Patient was walking with assistance from PT today when she had a popping in her ankle and increased pain.  There is no gross edema.  No other injuries or complaints.     Physical Exam   Triage Vital Signs: ED Triage Vitals  Enc Vitals Group     BP 02/28/22 1735 (!) 177/69     Pulse Rate 02/28/22 1735 80     Resp 02/28/22 1735 18     Temp 02/28/22 1735 97.8 F (36.6 C)     Temp Source 02/28/22 1735 Oral     SpO2 02/28/22 1735 100 %     Weight --      Height --      Head Circumference --      Peak Flow --      Pain Score 02/28/22 1732 5     Pain Loc --      Pain Edu? --      Excl. in [place]? --     Most recent vital signs: Vitals:   02/28/22 1735  BP: (!) 177/69  Pulse: 80  Resp: 18  Temp: 97.8 F (36.6 C)  SpO2: 100%     General: Alert and in no acute distress.  Cardiovascular:  Good peripheral perfusion Respiratory: Normal respiratory effort without tachypnea or retractions. Lungs CTAB.  Musculoskeletal: Full range of motion to all extremities.  Visualization of the ankle reveals no edema, ecchymosis.  Patient is slightly tender to palpation along the lateral calcaneus region.  No palpable abnormality.  No other significant tenderness to the foot.  Pulses and sensation intact.  Palpation along the posterior heel reveals intact Achilles tendon. Neurologic:  No gross focal neurologic deficits are appreciated.  Skin:   No rash  noted Other:   ED Results / Procedures / Treatments   Labs (all labs ordered are listed, but only abnormal results are displayed) Labs Reviewed - No data to display   EKG     RADIOLOGY  I personally viewed, evaluated, and interpreted these images as part of my medical decision making, as well as reviewing the written report by the radiologist.  ED Provider Interpretation: Sclerosis of the posterior calcaneus consistent with recent fracture.  No evidence of ongoing fracture.  No dislocation.  DG Ankle Right Port  Result Date: 02/28/2022 CLINICAL DATA:  Ankle pain. EXAM: PORTABLE RIGHT ANKLE - 2 VIEW COMPARISON:  Right foot x-rays 01/09/2022 FINDINGS: Generalized osteopenia. Sclerotic changes in the posterior calcaneus, not present on the x-rays of 01/09/2022. Probable nondisplaced fracture. Ankle joint normal. IMPRESSION: Generalized osteopenia. Sclerosis in the posterior calcaneus compatible with recent fracture Electronically Signed   By: Franchot Gallo M.D.   On: 02/28/2022 18:04    PROCEDURES:  Critical Care performed: No  Procedures   MEDICATIONS ORDERED IN ED: Medications  predniSONE (DELTASONE) tablet 10 mg (10 mg Oral Given 02/28/22 2021)  oxyCODONE-acetaminophen (PERCOCET/ROXICET) 5-325 MG per tablet 1 tablet (1 tablet Oral Given 02/28/22 2021)  IMPRESSION / MDM / ASSESSMENT AND PLAN / ED COURSE  I reviewed the triage vital signs and the nursing notes.                              Differential diagnosis includes, but is not limited to, fibular fracture, distal tibia fracture, malleolus fracture, talus fracture, calcaneal fracture, ligament rupture, Achilles tendon rupture  Patient's presentation is most consistent with acute presentation with potential threat to life or bodily function.   Patient's diagnosis is consistent with ankle pain.  Patient presents to the ED with increased ankle pain after a popping sensation while working with physical therapy  today.  Patient was walking when she felt a pop and had ankle pain.  Imaging is reassuring at this time with known fracture or dislocation.  Achilles tendon is intact.  Given the reported pain and tenderness on exam it does correlate with sclerosis on x-ray consistent with a recent calcaneal fracture.  Looked at imaging from 09/23 which revealed no evidence of fracture that the radiologist or I could determine at this time.  However given the sclerosis suspect that the original injury from September was a slight calcaneal fracture.  At this time we discussed staying off the foot versus going back to physical therapy.  Given the length the time for progress for the ankle I recommend a brief first bite from PT but close return to keep ambulation improving.  Return precautions discussed with patient and granddaughter.. Patient is given ED precautions to return to the ED for any worsening or new symptoms.        FINAL CLINICAL IMPRESSION(S) / ED DIAGNOSES   Final diagnoses:  None     Rx / DC Orders   ED Discharge Orders     None        Note:  This document was prepared using Dragon voice recognition software and may include unintentional dictation errors.   Brynda Peon 02/28/22 2029    Rada Hay, MD 02/28/22 2308

## 2022-03-29 DIAGNOSIS — H401112 Primary open-angle glaucoma, right eye, moderate stage: Secondary | ICD-10-CM | POA: Diagnosis not present

## 2022-03-29 DIAGNOSIS — Z0100 Encounter for examination of eyes and vision without abnormal findings: Secondary | ICD-10-CM | POA: Diagnosis not present

## 2022-09-15 DIAGNOSIS — Z961 Presence of intraocular lens: Secondary | ICD-10-CM | POA: Diagnosis not present

## 2022-09-15 DIAGNOSIS — H401132 Primary open-angle glaucoma, bilateral, moderate stage: Secondary | ICD-10-CM | POA: Diagnosis not present

## 2022-09-15 DIAGNOSIS — H04123 Dry eye syndrome of bilateral lacrimal glands: Secondary | ICD-10-CM | POA: Diagnosis not present

## 2023-01-11 DIAGNOSIS — Z23 Encounter for immunization: Secondary | ICD-10-CM | POA: Diagnosis not present

## 2023-01-11 DIAGNOSIS — E782 Mixed hyperlipidemia: Secondary | ICD-10-CM | POA: Diagnosis not present

## 2023-01-11 DIAGNOSIS — I5030 Unspecified diastolic (congestive) heart failure: Secondary | ICD-10-CM | POA: Diagnosis not present

## 2023-01-11 DIAGNOSIS — G629 Polyneuropathy, unspecified: Secondary | ICD-10-CM | POA: Diagnosis not present

## 2023-01-11 DIAGNOSIS — E538 Deficiency of other specified B group vitamins: Secondary | ICD-10-CM | POA: Diagnosis not present

## 2023-01-11 DIAGNOSIS — F32A Depression, unspecified: Secondary | ICD-10-CM | POA: Diagnosis not present

## 2023-01-11 DIAGNOSIS — Z0000 Encounter for general adult medical examination without abnormal findings: Secondary | ICD-10-CM | POA: Diagnosis not present

## 2023-04-13 DIAGNOSIS — H04123 Dry eye syndrome of bilateral lacrimal glands: Secondary | ICD-10-CM | POA: Diagnosis not present

## 2023-04-13 DIAGNOSIS — H401132 Primary open-angle glaucoma, bilateral, moderate stage: Secondary | ICD-10-CM | POA: Diagnosis not present

## 2023-04-13 DIAGNOSIS — Z961 Presence of intraocular lens: Secondary | ICD-10-CM | POA: Diagnosis not present

## 2023-04-18 DIAGNOSIS — C44609 Unspecified malignant neoplasm of skin of left upper limb, including shoulder: Secondary | ICD-10-CM

## 2023-04-18 HISTORY — DX: Unspecified malignant neoplasm of skin of left upper limb, including shoulder: C44.609

## 2023-08-09 ENCOUNTER — Emergency Department (HOSPITAL_COMMUNITY)
Admission: EM | Admit: 2023-08-09 | Discharge: 2023-08-09 | Disposition: A | Discharge status: Home / Self Care | Attending: Emergency Medicine | Admitting: Emergency Medicine

## 2023-08-09 ENCOUNTER — Other Ambulatory Visit: Payer: Self-pay

## 2023-08-09 ENCOUNTER — Emergency Department (HOSPITAL_COMMUNITY)

## 2023-08-09 ENCOUNTER — Encounter (HOSPITAL_COMMUNITY): Payer: Self-pay

## 2023-08-09 DIAGNOSIS — Z7901 Long term (current) use of anticoagulants: Secondary | ICD-10-CM | POA: Insufficient documentation

## 2023-08-09 DIAGNOSIS — S0101XA Laceration without foreign body of scalp, initial encounter: Secondary | ICD-10-CM | POA: Insufficient documentation

## 2023-08-09 DIAGNOSIS — S199XXA Unspecified injury of neck, initial encounter: Secondary | ICD-10-CM | POA: Diagnosis not present

## 2023-08-09 DIAGNOSIS — Z85038 Personal history of other malignant neoplasm of large intestine: Secondary | ICD-10-CM | POA: Insufficient documentation

## 2023-08-09 DIAGNOSIS — Y92019 Unspecified place in single-family (private) house as the place of occurrence of the external cause: Secondary | ICD-10-CM | POA: Insufficient documentation

## 2023-08-09 DIAGNOSIS — M79604 Pain in right leg: Secondary | ICD-10-CM | POA: Diagnosis not present

## 2023-08-09 DIAGNOSIS — S2002XA Contusion of left breast, initial encounter: Secondary | ICD-10-CM | POA: Diagnosis not present

## 2023-08-09 DIAGNOSIS — Z4789 Encounter for other orthopedic aftercare: Secondary | ICD-10-CM | POA: Diagnosis not present

## 2023-08-09 DIAGNOSIS — S0990XA Unspecified injury of head, initial encounter: Secondary | ICD-10-CM | POA: Diagnosis not present

## 2023-08-09 DIAGNOSIS — Z043 Encounter for examination and observation following other accident: Secondary | ICD-10-CM | POA: Diagnosis not present

## 2023-08-09 DIAGNOSIS — I517 Cardiomegaly: Secondary | ICD-10-CM | POA: Diagnosis not present

## 2023-08-09 DIAGNOSIS — I700 Atherosclerosis of aorta: Secondary | ICD-10-CM | POA: Diagnosis not present

## 2023-08-09 DIAGNOSIS — S0083XA Contusion of other part of head, initial encounter: Secondary | ICD-10-CM | POA: Diagnosis not present

## 2023-08-09 DIAGNOSIS — S81811A Laceration without foreign body, right lower leg, initial encounter: Secondary | ICD-10-CM | POA: Insufficient documentation

## 2023-08-09 DIAGNOSIS — R9431 Abnormal electrocardiogram [ECG] [EKG]: Secondary | ICD-10-CM | POA: Diagnosis not present

## 2023-08-09 DIAGNOSIS — M8580 Other specified disorders of bone density and structure, unspecified site: Secondary | ICD-10-CM | POA: Diagnosis not present

## 2023-08-09 DIAGNOSIS — W19XXXA Unspecified fall, initial encounter: Secondary | ICD-10-CM | POA: Diagnosis not present

## 2023-08-09 DIAGNOSIS — I771 Stricture of artery: Secondary | ICD-10-CM | POA: Diagnosis not present

## 2023-08-09 DIAGNOSIS — M19071 Primary osteoarthritis, right ankle and foot: Secondary | ICD-10-CM | POA: Diagnosis not present

## 2023-08-09 DIAGNOSIS — M170 Bilateral primary osteoarthritis of knee: Secondary | ICD-10-CM | POA: Diagnosis not present

## 2023-08-09 LAB — I-STAT CHEM 8, ED
BUN: 28 mg/dL — ABNORMAL HIGH (ref 8–23)
Calcium, Ion: 0.89 mmol/L — CL (ref 1.15–1.40)
Chloride: 103 mmol/L (ref 98–111)
Creatinine, Ser: 1 mg/dL (ref 0.44–1.00)
Glucose, Bld: 94 mg/dL (ref 70–99)
HCT: 39 % (ref 36.0–46.0)
Hemoglobin: 13.3 g/dL (ref 12.0–15.0)
Potassium: 4 mmol/L (ref 3.5–5.1)
Sodium: 134 mmol/L — ABNORMAL LOW (ref 135–145)
TCO2: 27 mmol/L (ref 22–32)

## 2023-08-09 LAB — BASIC METABOLIC PANEL WITH GFR
Anion gap: 13 (ref 5–15)
BUN: 23 mg/dL (ref 8–23)
CO2: 22 mmol/L (ref 22–32)
Calcium: 8.5 mg/dL — ABNORMAL LOW (ref 8.9–10.3)
Chloride: 101 mmol/L (ref 98–111)
Creatinine, Ser: 0.87 mg/dL (ref 0.44–1.00)
GFR, Estimated: >60 mL/min (ref >60)
Glucose, Bld: 110 mg/dL — ABNORMAL HIGH (ref 70–99)
Potassium: 4.1 mmol/L (ref 3.5–5.1)
Sodium: 136 mmol/L (ref 135–145)

## 2023-08-09 LAB — CBC
HCT: 43.9 % (ref 36.0–46.0)
Hemoglobin: 13.6 g/dL (ref 12.0–15.0)
MCH: 30.8 pg (ref 26.0–34.0)
MCHC: 31 g/dL (ref 30.0–36.0)
MCV: 99.3 fL (ref 80.0–100.0)
Platelets: 194 10*3/uL (ref 150–400)
RBC: 4.42 MIL/uL (ref 3.87–5.11)
RDW: 11.8 % (ref 11.5–15.5)
WBC: 9.5 10*3/uL (ref 4.0–10.5)
nRBC: 0 % (ref 0.0–0.2)

## 2023-08-09 MED ORDER — IOHEXOL 350 MG/ML SOLN
75.0000 mL | Freq: Once | INTRAVENOUS | Status: AC | PRN
  Administered 2023-08-09: 75 mL via INTRAVENOUS

## 2023-08-09 NOTE — ED Notes (Signed)
 Patient returned to room from CT.

## 2023-08-09 NOTE — ED Notes (Signed)
 Head wound cleaned, suture cart and EDP at bedside.

## 2023-08-09 NOTE — ED Triage Notes (Signed)
 Patient bib [facility] from home after a fall. Patient hit the back of her head and has a laceration on back of head in the center. EMS denies LOC and patient is alert and oriented x4.    Patient has bruising and lacerations on her from previous falls.   New lacerations are posterior head and RLE.

## 2023-08-09 NOTE — Discharge Instructions (Addendum)
 There are 5 staples in your head.  They can be removed in about 7 to 10 days.  There is also some enlargement of the aorta in her chest which can be followed as an outpatient and also a right sided inguinal hernia.

## 2023-08-09 NOTE — ED Provider Notes (Signed)
 [facility] AT [facility] Provider Note   CSN: [id number] Arrival date & time: 08/09/23  1201     History  Chief Complaint  Patient presents with   Fall    Caroline Aguilar is a 89 y.o. female.   Fall  Patient presented as level 2 trauma.  Seen in room.  Had a fall at home.  Reportedly fell and hit back of head.  Is on Eliquis .  Also some left-sided chest pain.  Has bruising on her left breast.  States that was from her fall about a week ago.  States she was not seen in the ER for that.    Past Medical History:  Diagnosis Date   A-fib (HCC)    Angina pectoris (HCC)    Cancer (HCC)    Colon cancer   Falls frequently    GERD (gastroesophageal reflux disease)    Hyperlipidemia    Neuropathy     Home Medications Prior to Admission medications   Medication Sig Start Date End Date Taking? Authorizing Provider  apixaban  (ELIQUIS ) 2.5 MG TABS tablet Take 2.5 mg by mouth 2 (two) times daily.     [provider]  buPROPion  (WELLBUTRIN  XL) 150 MG 24 hr tablet Take 150 mg by mouth every morning.  10/20/19   [provider]  celecoxib  (CELEBREX ) 100 MG capsule Take 1 capsule (100 mg total) by mouth 2 (two) times daily. 02/28/22   Cuthriell, Jonathan D, PA-C  cyanocobalamin  1000 MCG tablet Take 1 tablet (1,000 mcg total) by mouth daily. 01/18/22   Verla Glaze, MD  docusate sodium  (COLACE) 100 MG capsule Take 100 mg by mouth daily.     [provider]  gabapentin  (NEURONTIN ) 100 MG capsule Take 100 mg by mouth at bedtime. 10/20/19   [provider]  latanoprost  (XALATAN ) 0.005 % ophthalmic solution Place 1 drop into both eyes at bedtime.     [provider]  lidocaine  (LIDODERM ) 5 % Remove & Discard patch within 12 hours or as directed by MD.  Apply to area of pain 01/17/22   Verla Glaze, MD  midodrine  (PROAMATINE ) 2.5 MG tablet Take 1 tablet (2.5 mg total) by mouth 2 (two) times daily with a meal.  01/17/22   Verla Glaze, MD  midodrine  (PROAMATINE ) 5 MG tablet Take 1 tablet (5 mg total) by mouth daily with breakfast. 01/18/22   Verla Glaze, MD  mometasone -formoterol  (DULERA ) 200-5 MCG/ACT AERO Inhale 2 puffs into the lungs 2 (two) times daily. 01/17/22   Verla Glaze, MD  omeprazole (PRILOSEC) 20 MG capsule Take 20 mg by mouth at bedtime.     [provider]  oxyCODONE -acetaminophen  (PERCOCET/ROXICET) 5-325 MG tablet Take 1 tablet by mouth every 6 (six) hours as needed. 02/28/22   Cuthriell, Ardath Bears, PA-C  predniSONE  (DELTASONE ) 10 MG tablet Take 1 tablet (10 mg total) by mouth daily. 02/28/22   Cuthriell, Ardath Bears, PA-C  sertraline  (ZOLOFT ) 50 MG tablet Take 50 mg by mouth every morning.     [provider]  simvastatin  (ZOCOR ) 20 MG tablet Take 20 mg by mouth daily at 6 PM.  05/18/18   [provider]      Allergies    Penicillins, Penicillins, Meloxicam , Azithromycin , Cortisone, Famciclovir, Morphine , Streptomycin, Tramadol , and Venlafaxine    Review of Systems   Review of Systems  Physical Exam Updated Vital Signs BP (!) 142/54   Pulse (!) 58   Temp 97.8 F (36.6 C)   Resp  19   Ht 5' (1.524 m)   Wt 63.5 kg   SpO2 94%   BMI 27.34 kg/m  Physical Exam Vitals reviewed.  HENT:     Head:     Comments: Right sided occipital laceration.  Bleeding controlled this time.  Has Steri-Strips over left eyebrow from previous fall. Pulmonary:     Comments: Some bruising on left chest over breast.  No subcu emphysema. Chest:     Chest wall: Tenderness present.  Abdominal:     Tenderness: There is no abdominal tenderness.  Musculoskeletal:     Cervical back: No tenderness.     Comments: Steri-Strip left lower leg from previous fall.  Bleeding controlled.  Skin tear right lateral lower leg.  Does have some tenderness under skin tear on right side.  Neurological:     Mental Status: She is alert.     ED Results / Procedures / Treatments    Labs (all labs ordered are listed, but only abnormal results are displayed) Labs Reviewed  BASIC METABOLIC PANEL WITH GFR - Abnormal; Notable for the following components:      Result Value   Glucose, Bld 110 (*)    Calcium  8.5 (*)    All other components within normal limits  I-STAT CHEM 8, ED - Abnormal; Notable for the following components:   Sodium 134 (*)    BUN 28 (*)    Calcium , Ion 0.89 (*)    All other components within normal limits  CBC    EKG None  Radiology CT HEAD WO CONTRAST ( ) Result Date: 08/09/2023 CLINICAL DATA:  Fall and trauma to the head and neck. EXAM: CT HEAD WITHOUT CONTRAST CT CERVICAL SPINE WITHOUT CONTRAST TECHNIQUE: Multidetector CT imaging of the head and cervical spine was performed following the standard protocol without intravenous contrast. Multiplanar CT image reconstructions of the cervical spine were also generated. RADIATION DOSE REDUCTION: This exam was performed according to the departmental dose-optimization program which includes automated exposure control, adjustment of the mA and/or kV according to patient size and/or use of iterative reconstruction technique. COMPARISON:  CT dated 01/11/2021. FINDINGS: CT HEAD FINDINGS Brain: Moderate age-related atrophy and chronic microvascular ischemic changes. There is no acute intracranial hemorrhage. No mass effect or midline shift. No extra-axial fluid collection. Vascular: No hyperdense vessel or unexpected calcification. Skull: Normal. Negative for fracture or focal lesion. Sinuses/Orbits: No acute finding. Other: Small left forehead contusion. CT CERVICAL SPINE FINDINGS Alignment: No acute subluxation. Skull base and vertebrae: No acute fracture. Evaluation for fracture is limited due to advanced osteopenia. Soft tissues and spinal canal: No prevertebral fluid or swelling. No visible canal hematoma. Disc levels:  No acute findings.  Degenerative changes. Upper chest: Negative. Other: None IMPRESSION: 1.  No acute intracranial pathology. Moderate age-related atrophy and chronic microvascular ischemic changes. 2. No acute/traumatic cervical spine pathology. Electronically Signed   By: Angus Bark M.D.   On: 08/09/2023 14:42   CT Cervical Spine Wo Contrast Result Date: 08/09/2023 CLINICAL DATA:  Fall and trauma to the head and neck. EXAM: CT HEAD WITHOUT CONTRAST CT CERVICAL SPINE WITHOUT CONTRAST TECHNIQUE: Multidetector CT imaging of the head and cervical spine was performed following the standard protocol without intravenous contrast. Multiplanar CT image reconstructions of the cervical spine were also generated. RADIATION DOSE REDUCTION: This exam was performed according to the departmental dose-optimization program which includes automated exposure control, adjustment of the mA and/or kV according to patient size and/or use of iterative reconstruction technique. COMPARISON:  CT  dated 01/11/2021. FINDINGS: CT HEAD FINDINGS Brain: Moderate age-related atrophy and chronic microvascular ischemic changes. There is no acute intracranial hemorrhage. No mass effect or midline shift. No extra-axial fluid collection. Vascular: No hyperdense vessel or unexpected calcification. Skull: Normal. Negative for fracture or focal lesion. Sinuses/Orbits: No acute finding. Other: Small left forehead contusion. CT CERVICAL SPINE FINDINGS Alignment: No acute subluxation. Skull base and vertebrae: No acute fracture. Evaluation for fracture is limited due to advanced osteopenia. Soft tissues and spinal canal: No prevertebral fluid or swelling. No visible canal hematoma. Disc levels:  No acute findings.  Degenerative changes. Upper chest: Negative. Other: None IMPRESSION: 1. No acute intracranial pathology. Moderate age-related atrophy and chronic microvascular ischemic changes. 2. No acute/traumatic cervical spine pathology. Electronically Signed   By: Angus Bark M.D.   On: 08/09/2023 14:42   DG Tibia/Fibula Right Result  Date: 08/09/2023 CLINICAL DATA:  Lower extremity pain EXAM: RIGHT TIBIA AND FIBULA - 2 VIEW COMPARISON:  None Available. FINDINGS: Status post prior ORIF, dynamic plate and transfixing screws in good position. Osteoarthrosis of both medial and lateral compartments of the knee. Osteoarthrosis of the tibiotalar joint No acute fractures or other bony abnormalities. No hardware failure IMPRESSION: *No acute fractures or other bony abnormalities. *Osteoarthrosis of both medial and lateral compartments of the knee. Osteoarthrosis of the tibiotalar joint. Electronically Signed   By: Fredrich Jefferson M.D.   On: 08/09/2023 14:37   DG Pelvis Portable Result Date: 08/09/2023 CLINICAL DATA:  fall EXAM: PORTABLE PELVIS 1-2 VIEWS COMPARISON:  Sep 15, 2019 FINDINGS: Diffuse osteopenia. Partially visualized bilateral femoral intramedullary nails. Heterotopic bone surrounding both intertrochanteric regions. There is no evidence of pelvic fracture or diastasis. No pelvic bone lesions are seen. Multilevel degenerative disc disease of the spine. Partially visualized vertebroplasty cement at L4. Extensive peripheral vascular atherosclerosis. IMPRESSION: Diffuse osteopenia.  Otherwise, no acute fracture or dislocation. Electronically Signed   By: Rance Burrows M.D.   On: 08/09/2023 14:04   DG Chest Portable 1 View Result Date: 08/09/2023 CLINICAL DATA:  fall EXAM: PORTABLE CHEST - 1 VIEW COMPARISON:  January 09, 2022 FINDINGS: No focal airspace consolidation, pleural effusion, or pneumothorax. Heart mild cardiomegaly. Endovascular aortic valve replacement. Tortuous aorta with aortic atherosclerosis. Chronic appearing compression fractures within the thoracic spine. Diffuse osteopenia. Mild dextrocurvature of the thoracic spine. Multilevel thoracic osteophytosis. IMPRESSION: No acute cardiopulmonary abnormality. Electronically Signed   By: Rance Burrows M.D.   On: 08/09/2023 13:47   CT CHEST ABDOMEN PELVIS W CONTRAST Result  Date: 08/09/2023 CLINICAL DATA:  Fall. EXAM: CT CHEST, ABDOMEN, AND PELVIS WITH CONTRAST TECHNIQUE: Multidetector CT imaging of the chest, abdomen and pelvis was performed following the standard protocol during bolus administration of intravenous contrast. RADIATION DOSE REDUCTION: This exam was performed according to the departmental dose-optimization program which includes automated exposure control, adjustment of the mA and/or kV according to patient size and/or use of iterative reconstruction technique. CONTRAST:  75mL OMNIPAQUE  IOHEXOL  350 MG/ML SOLN COMPARISON:  January 23, 2014.  January 14, 2022. FINDINGS: CT CHEST FINDINGS Cardiovascular: 4.8 cm ascending thoracic aortic aneurysm is again noted. No dissection is noted. Mild cardiomegaly. Status post transcatheter aortic valve repair. No pericardial effusion is noted. Mild coronary artery calcifications are noted. Mediastinum/Nodes: No enlarged mediastinal, hilar, or axillary lymph nodes. Thyroid  gland, trachea, and esophagus demonstrate no significant findings. Lungs/Pleura: Mild biapical scarring is noted. No acute pulmonary disease. No pneumothorax or pleural effusion. Musculoskeletal: Old left rib fractures are noted. No acute abnormality seen. CT ABDOMEN  PELVIS FINDINGS Hepatobiliary: No focal liver abnormality is seen. No gallstones, gallbladder wall thickening, or biliary dilatation. Pancreas: Unremarkable. No pancreatic ductal dilatation or surrounding inflammatory changes. Spleen: Normal in size without focal abnormality. Adrenals/Urinary Tract: Adrenal glands appear normal. Bilateral renal cysts are noted for which no further follow-up is required. No hydronephrosis or renal obstruction is noted. Urinary bladder is unremarkable. Stomach/Bowel: Stomach is unremarkable. There is no evidence of bowel obstruction or inflammation. Sigmoid diverticulosis is noted. Vascular/Lymphatic: Aortic atherosclerosis. No enlarged abdominal or pelvic lymph nodes.  Reproductive: Uterus is not clearly visualized. No definite adnexal abnormality is noted. Other: Small right inguinal hernia is noted which contains a loop of small bowel, but does not result in obstruction. No ascites is noted. Musculoskeletal: Status post kyphoplasty of L1, L2 and L4. Status post surgical internal fixation of old proximal bilateral femoral fractures. No acute osseous abnormality is noted. IMPRESSION: No acute traumatic injury seen in the chest, abdomen or pelvis. 4.8 cm ascending Ascending thoracic aortic aneurysm. Recommend semi-annual imaging followup by CTA or MRA and referral to cardiothoracic surgery if not already obtained. This recommendation follows 2010 ACCF/AHA/AATS/ACR/ASA/SCA/SCAI/SIR/STS/SVM Guidelines for the Diagnosis and Management of Patients With Thoracic Aortic Disease. Circulation. 2010; 121: Z610-R604. Aortic aneurysm NOS (ICD10-I71.9). Status post transcatheter aortic valve repair. Sigmoid diverticulosis without inflammation. Small right inguinal hernia is noted which contains a loop of small bowel, but does not result in obstruction. Aortic Atherosclerosis (ICD10-I70.0). Electronically Signed   By: Rosalene Colon M.D.   On: 08/09/2023 13:23    Procedures .Laceration Repair  Date/Time: 08/09/2023 3:16 PM  Performed by: Mozell Arias, MD Authorized by: Mozell Arias, MD   Consent:    Consent obtained:  Verbal   Consent given by:  Patient   Risks discussed:  Infection, pain, retained foreign body, need for additional repair and nerve damage Universal protocol:    Patient identity confirmed:  Verbally with patient Anesthesia:    Anesthesia method:  None Laceration details:    Location:  Scalp   Scalp location:  Occipital   Length (cm):  3 Pre-procedure details:    Preparation:  Imaging obtained to evaluate for foreign bodies Exploration:    Limited defect created (wound extended): no   Treatment:    Area cleansed with:  Shur-Clens   Amount of  cleaning:  Standard Skin repair:    Repair method:  Staples   Number of staples:  5 Approximation:    Approximation:  Close Repair type:    Repair type:  Simple Post-procedure details:    Dressing:  Sterile dressing   Procedure completion:  Tolerated well, no immediate complications     Medications Ordered in ED Medications  iohexol  (OMNIPAQUE ) 350 MG/ML injection 75 mL (75 mLs Intravenous Contrast Given 08/09/23 1300)    ED Course/ Medical Decision Making/ A&P                                 Medical Decision Making Amount and/or Complexity of Data Reviewed Labs: ordered. Radiology: ordered.  Risk Prescription drug management.   Patient with fall.  Sounds mechanical.  Also had fall a week ago.  Has skin tear on right leg and laceration to occipital area.  Will get head CT and cervical spine CT.  Also with chest tenderness and bruising from previous fall with increased pain will get CT scan of chest abdomen pelvis.  Also x-ray right lower leg.  Right lower  leg x-ray independently interpreted and shows no fracture.  No intrathoracic injury also but does have inguinal hernia on the right.  Patient states she knew she had this.  Also ascending aortic aneurysm which can be followed as an outpatient.  Laceration on scalp closed with staples.        Final Clinical Impression(s) / ED Diagnoses Final diagnoses:  Fall, initial encounter  Scalp laceration, initial encounter    Rx / DC Orders ED Discharge Orders     None         Mozell Arias, MD 08/09/23 1517

## 2023-08-09 NOTE — ED Notes (Signed)
 X-ray at bedside

## 2023-08-09 NOTE — ED Notes (Signed)
Patient transported to CT with RN on monitor.

## 2023-08-09 NOTE — Progress Notes (Signed)
 Orthopedic Tech Progress Note Patient Details:  Caroline Aguilar 27-Feb-1926 [id number]  Level II trauma, no ortho tech needs at this time.  Patient ID: Caroline Aguilar, female   DOB: Sep 22, 1925, 89 y.o.   MRN: [id number]  Caroline Aguilar 08/09/2023, 1:00 PM

## 2023-08-20 DIAGNOSIS — S0101XD Laceration without foreign body of scalp, subsequent encounter: Secondary | ICD-10-CM | POA: Diagnosis not present

## 2023-08-20 DIAGNOSIS — W010XXD Fall on same level from slipping, tripping and stumbling without subsequent striking against object, subsequent encounter: Secondary | ICD-10-CM | POA: Diagnosis not present

## 2023-08-20 DIAGNOSIS — Z4802 Encounter for removal of sutures: Secondary | ICD-10-CM | POA: Diagnosis not present

## 2023-10-12 DIAGNOSIS — H04123 Dry eye syndrome of bilateral lacrimal glands: Secondary | ICD-10-CM | POA: Diagnosis not present

## 2023-10-12 DIAGNOSIS — H401132 Primary open-angle glaucoma, bilateral, moderate stage: Secondary | ICD-10-CM | POA: Diagnosis not present

## 2023-10-22 DIAGNOSIS — F32A Depression, unspecified: Secondary | ICD-10-CM | POA: Diagnosis not present

## 2023-10-22 DIAGNOSIS — E538 Deficiency of other specified B group vitamins: Secondary | ICD-10-CM | POA: Diagnosis not present

## 2023-10-22 DIAGNOSIS — M4800 Spinal stenosis, site unspecified: Secondary | ICD-10-CM | POA: Diagnosis not present

## 2023-10-22 DIAGNOSIS — I5030 Unspecified diastolic (congestive) heart failure: Secondary | ICD-10-CM | POA: Diagnosis not present

## 2023-10-22 DIAGNOSIS — I4891 Unspecified atrial fibrillation: Secondary | ICD-10-CM | POA: Diagnosis not present

## 2023-10-22 DIAGNOSIS — Z1331 Encounter for screening for depression: Secondary | ICD-10-CM | POA: Diagnosis not present

## 2023-10-22 DIAGNOSIS — Z0000 Encounter for general adult medical examination without abnormal findings: Secondary | ICD-10-CM | POA: Diagnosis not present

## 2023-10-22 DIAGNOSIS — R739 Hyperglycemia, unspecified: Secondary | ICD-10-CM | POA: Diagnosis not present

## 2023-10-23 DIAGNOSIS — E782 Mixed hyperlipidemia: Secondary | ICD-10-CM | POA: Diagnosis not present

## 2024-01-08 ENCOUNTER — Emergency Department
Admission: EM | Admit: 2024-01-08 | Discharge: 2024-01-08 | Disposition: A | Discharge status: Home / Self Care | Attending: Emergency Medicine | Admitting: Emergency Medicine

## 2024-01-08 ENCOUNTER — Emergency Department

## 2024-01-08 ENCOUNTER — Other Ambulatory Visit: Payer: Self-pay

## 2024-01-08 DIAGNOSIS — S51811A Laceration without foreign body of right forearm, initial encounter: Secondary | ICD-10-CM | POA: Diagnosis not present

## 2024-01-08 DIAGNOSIS — S0990XA Unspecified injury of head, initial encounter: Secondary | ICD-10-CM | POA: Insufficient documentation

## 2024-01-08 DIAGNOSIS — I700 Atherosclerosis of aorta: Secondary | ICD-10-CM | POA: Diagnosis not present

## 2024-01-08 DIAGNOSIS — R300 Dysuria: Secondary | ICD-10-CM | POA: Insufficient documentation

## 2024-01-08 DIAGNOSIS — I6789 Other cerebrovascular disease: Secondary | ICD-10-CM | POA: Insufficient documentation

## 2024-01-08 DIAGNOSIS — Z952 Presence of prosthetic heart valve: Secondary | ICD-10-CM | POA: Diagnosis not present

## 2024-01-08 DIAGNOSIS — S199XXA Unspecified injury of neck, initial encounter: Secondary | ICD-10-CM | POA: Diagnosis not present

## 2024-01-08 DIAGNOSIS — Y92009 Unspecified place in unspecified non-institutional (private) residence as the place of occurrence of the external cause: Secondary | ICD-10-CM | POA: Insufficient documentation

## 2024-01-08 DIAGNOSIS — Z7901 Long term (current) use of anticoagulants: Secondary | ICD-10-CM | POA: Diagnosis not present

## 2024-01-08 DIAGNOSIS — W19XXXA Unspecified fall, initial encounter: Secondary | ICD-10-CM

## 2024-01-08 DIAGNOSIS — M19011 Primary osteoarthritis, right shoulder: Secondary | ICD-10-CM | POA: Diagnosis not present

## 2024-01-08 DIAGNOSIS — R079 Chest pain, unspecified: Secondary | ICD-10-CM | POA: Diagnosis not present

## 2024-01-08 DIAGNOSIS — M4802 Spinal stenosis, cervical region: Secondary | ICD-10-CM | POA: Diagnosis not present

## 2024-01-08 DIAGNOSIS — S59911A Unspecified injury of right forearm, initial encounter: Secondary | ICD-10-CM | POA: Diagnosis present

## 2024-01-08 DIAGNOSIS — M25512 Pain in left shoulder: Secondary | ICD-10-CM | POA: Diagnosis not present

## 2024-01-08 DIAGNOSIS — W01198A Fall on same level from slipping, tripping and stumbling with subsequent striking against other object, initial encounter: Secondary | ICD-10-CM | POA: Insufficient documentation

## 2024-01-08 DIAGNOSIS — M5030 Other cervical disc degeneration, unspecified cervical region: Secondary | ICD-10-CM | POA: Diagnosis not present

## 2024-01-08 DIAGNOSIS — R0789 Other chest pain: Secondary | ICD-10-CM | POA: Insufficient documentation

## 2024-01-08 DIAGNOSIS — M47812 Spondylosis without myelopathy or radiculopathy, cervical region: Secondary | ICD-10-CM | POA: Diagnosis not present

## 2024-01-08 DIAGNOSIS — R918 Other nonspecific abnormal finding of lung field: Secondary | ICD-10-CM | POA: Diagnosis not present

## 2024-01-08 DIAGNOSIS — R519 Headache, unspecified: Secondary | ICD-10-CM | POA: Diagnosis not present

## 2024-01-08 DIAGNOSIS — J4489 Other specified chronic obstructive pulmonary disease: Secondary | ICD-10-CM | POA: Diagnosis not present

## 2024-01-08 DIAGNOSIS — I2510 Atherosclerotic heart disease of native coronary artery without angina pectoris: Secondary | ICD-10-CM | POA: Diagnosis not present

## 2024-01-08 DIAGNOSIS — I7121 Aneurysm of the ascending aorta, without rupture: Secondary | ICD-10-CM | POA: Diagnosis not present

## 2024-01-08 DIAGNOSIS — J40 Bronchitis, not specified as acute or chronic: Secondary | ICD-10-CM | POA: Diagnosis not present

## 2024-01-08 DIAGNOSIS — M25511 Pain in right shoulder: Secondary | ICD-10-CM | POA: Diagnosis not present

## 2024-01-08 LAB — CBC
HCT: 45.1 % (ref 36.0–46.0)
Hemoglobin: 13.6 g/dL (ref 12.0–15.0)
MCH: 29.9 pg (ref 26.0–34.0)
MCHC: 30.2 g/dL (ref 30.0–36.0)
MCV: 99.1 fL (ref 80.0–100.0)
Platelets: 187 K/uL (ref 150–400)
RBC: 4.55 MIL/uL (ref 3.87–5.11)
RDW: 12.6 % (ref 11.5–15.5)
WBC: 7 K/uL (ref 4.0–10.5)
nRBC: 0 % (ref 0.0–0.2)

## 2024-01-08 LAB — URINALYSIS, ROUTINE W REFLEX MICROSCOPIC
Bilirubin Urine: NEGATIVE
Glucose, UA: NEGATIVE mg/dL
Hgb urine dipstick: NEGATIVE
Ketones, ur: NEGATIVE mg/dL
Leukocytes,Ua: NEGATIVE
Nitrite: POSITIVE — AB
Protein, ur: NEGATIVE mg/dL
Specific Gravity, Urine: >1.046 — ABNORMAL HIGH (ref 1.005–1.030)
Squamous Epithelial / HPF: 0 /HPF (ref 0–5)
pH: 5 (ref 5.0–8.0)

## 2024-01-08 LAB — BASIC METABOLIC PANEL WITH GFR
Anion gap: 13 (ref 5–15)
BUN: 44 mg/dL — ABNORMAL HIGH (ref 8–23)
CO2: 23 mmol/L (ref 22–32)
Calcium: 8.9 mg/dL (ref 8.9–10.3)
Chloride: 103 mmol/L (ref 98–111)
Creatinine, Ser: 1.16 mg/dL — ABNORMAL HIGH (ref 0.44–1.00)
GFR, Estimated: 43 mL/min — ABNORMAL LOW (ref >60)
Glucose, Bld: 113 mg/dL — ABNORMAL HIGH (ref 70–99)
Potassium: 4.7 mmol/L (ref 3.5–5.1)
Sodium: 139 mmol/L (ref 135–145)

## 2024-01-08 LAB — TROPONIN I (HIGH SENSITIVITY)
Troponin I (High Sensitivity): 6 ng/L (ref <18)
Troponin I (High Sensitivity): 7 ng/L (ref <18)

## 2024-01-08 LAB — RESP PANEL BY RT-PCR (RSV, FLU A&B, COVID)  RVPGX2
Influenza A by PCR: NEGATIVE
Influenza B by PCR: NEGATIVE
Resp Syncytial Virus by PCR: NEGATIVE
SARS Coronavirus 2 by RT PCR: NEGATIVE

## 2024-01-08 MED ORDER — LIDOCAINE 5 % EX PTCH
1.0000 | MEDICATED_PATCH | CUTANEOUS | Status: DC
  Administered 2024-01-08: 1 via TRANSDERMAL
  Filled 2024-01-08: qty 1

## 2024-01-08 MED ORDER — ACETAMINOPHEN 500 MG PO TABS
1000.0000 mg | ORAL_TABLET | Freq: Once | ORAL | Status: AC
  Administered 2024-01-08: 1000 mg via ORAL
  Filled 2024-01-08: qty 2

## 2024-01-08 MED ORDER — FOSFOMYCIN TROMETHAMINE 3 G PO PACK
3.0000 g | PACK | Freq: Once | ORAL | Status: AC
  Administered 2024-01-08: 3 g via ORAL
  Filled 2024-01-08: qty 3

## 2024-01-08 MED ORDER — OXYCODONE HCL 5 MG PO TABS
2.5000 mg | ORAL_TABLET | Freq: Once | ORAL | Status: AC
  Administered 2024-01-08: 2.5 mg via ORAL
  Filled 2024-01-08: qty 1

## 2024-01-08 MED ORDER — IOHEXOL 350 MG/ML SOLN
75.0000 mL | Freq: Once | INTRAVENOUS | Status: AC | PRN
  Administered 2024-01-08: 75 mL via INTRAVENOUS

## 2024-01-08 MED ORDER — LIDOCAINE 5 % EX PTCH
1.0000 | MEDICATED_PATCH | CUTANEOUS | 0 refills | Status: AC
Start: 2024-01-08 — End: 2024-01-18

## 2024-01-08 MED ORDER — OXYCODONE HCL 5 MG PO TABS: 2.5000 mg | ORAL_TABLET | Freq: Four times a day (QID) | ORAL | 0 refills | Status: AC | PRN

## 2024-01-08 NOTE — Discharge Instructions (Addendum)
  You should keep an eye on her blood pressures at the home.  Midodrine  will increase her blood pressure so if her blood pressures are staying over 140 systolic there is no reason that she should take this medication.  If her blood pressures are still elevated after stopping the midodrine  then she may need to also be restarted on amlodipine .  This needs to be discussed with her primary care doctor.  I would for now keep a blood pressure once in the morning and once at night and write down if you gave the midodrine  or not.  Midodrine  should only be given if patient has BP <140  Take Tylenol  1 g every 8 hours with the lidocaine  patches.  Use the oxycodone  for breakthrough pain.  This can cause some increasing weakness can increase the risk for falls but will also help with her pain. Take miralax  to prevent constipation.  MPRESSION: 1. There stable ascending aortic aneurysm measuring 4.8 cm. Ascending thoracic aortic aneurysm. Recommend semi-annual imaging followup by CTA or MRA and referral to cardiothoracic surgery if not already obtained. This recommendation follows 2010 ACCF/AHA/AATS/ACR/ASA/SCA/SCAI/SIR/STS/SVM Guidelines for the Diagnosis and Management of Patients With Thoracic Aortic Disease. Circulation. 2010; 121: Z733-z630. Aortic aneurysm NOS (ICD10-I71.9) 2. Stable cardiomegaly. 3. Stable scarring in the right middle lobe and lingula.  Take oxycodone  as prescribed. Do not drink alcohol, drive or participate in any other potentially dangerous activities while taking this medication as it may make you sleepy. Do not take this medication with any other sedating medications, either prescription or over-the-counter. If you were prescribed Percocet or Vicodin, do not take these with acetaminophen  (Tylenol ) as it is already contained within these medications.  This medication is an opiate (or narcotic) pain medication and can be habit forming. Use it as little as possible to achieve adequate pain  control. Do not use or use it with extreme caution if you have a history of opiate abuse or dependence. If you are on a pain contract with your primary care doctor or a pain specialist, be sure to let them know you were prescribed this medication today from the Emergency Department. This medication is intended for your use only - do not give any to anyone else and keep it in a secure place where nobody else, especially children, have access to it.

## 2024-01-08 NOTE — ED Notes (Signed)
 This RN and Dorian RN assisted pt with ambulation. Pt was able to stand with assistance but not able to take a step. MD made aware.

## 2024-01-08 NOTE — ED Triage Notes (Signed)
 Pt to ED via [facility] from home. Pt had witnessed fall at home. No head trauma. No LOC. Pt is on [place]. Family tried to lift from underneath her arms and her a pop in her shoulders.   155/96 65 HR  95% RA

## 2024-01-08 NOTE — ED Notes (Signed)
 Pt was placed on bed pan for urine. Bed pan leaked. Pt changed into a gown and cleaned up accordingly

## 2024-01-08 NOTE — ED Provider Notes (Addendum)
 [facility] Provider Note    Event Date/Time   First MD Initiated Contact with Patient 01/08/24 1727     (approximate)   History   Fall   HPI  Caroline Aguilar is a 89 y.o. female with hyperlipidemia, mitral valve replacement on Eliquis  who comes in with concerns for a fall patient had a mechanical fall at home witnessed by family where they did not think she hit her head and family was trying to lift her up on her arms and he felt something pop in her shoulders and she started having pain in her shoulders and upper chest.     Tdap was updated in 2021    Physical Exam   Triage Vital Signs: ED Triage Vitals  Encounter Vitals Group     BP 01/08/24 1437 (!) 172/89     Girls Systolic BP Percentile --      Girls Diastolic BP Percentile --      Boys Systolic BP Percentile --      Boys Diastolic BP Percentile --      Pulse Rate 01/08/24 1437 62     Resp 01/08/24 1437 18     Temp 01/08/24 1437 98.1 F (36.7 C)     Temp Source 01/08/24 1917 Oral     SpO2 01/08/24 1437 97 %     Weight 01/08/24 1438 160 lb (72.6 kg)     Height 01/08/24 1438 5' 6 (1.676 m)     Head Circumference --      Peak Flow --      Pain Score 01/08/24 1437 10     Pain Loc --      Pain Education --      Exclude from Growth Chart --     Most recent vital signs: Vitals:   01/08/24 1917 01/08/24 1918  BP:  (!) 197/77  Pulse:  61  Resp:  19  Temp: 98.1 F (36.7 C)   SpO2:  96%     General: Awake, no distress.  CV:  Good peripheral perfusion.  Resp:  Normal effort.  Abd:  No distention.  Other:  Positive chest wall tenderness without any bruising able to lift both arms up off the bed.  She does have a skin tear on her right forearm but no tenderness on this area.  She is able to lift both legs up off the bed.  No hip tenderness.  No abdominal tenderness no T or L-spine tenderness.   ED Results / Procedures / Treatments   Labs (all labs ordered are listed, but only  abnormal results are displayed) Labs Reviewed  BASIC METABOLIC PANEL WITH GFR - Abnormal; Notable for the following components:      Result Value   Glucose, Bld 113 (*)    BUN 44 (*)    Creatinine, Ser 1.16 (*)    GFR, Estimated 43 (*)    All other components within normal limits  CBC  TROPONIN I (HIGH SENSITIVITY)  TROPONIN I (HIGH SENSITIVITY)     EKG  My interpretation of EKG:  Normal sinus rate of 62 without any ST elevation or T wave inversions, normal intervals  RADIOLOGY I have reviewed the xray personally and interpreted no evidence of any fractures   PROCEDURES:  Critical Care performed: No  Procedures   MEDICATIONS ORDERED IN ED: Medications  lidocaine  (LIDODERM ) 5 % 1 patch (1 patch Transdermal Patch Applied 01/08/24 1833)  acetaminophen  (TYLENOL ) tablet 1,000 mg (1,000 mg Oral Given  01/08/24 1833)  oxyCODONE  (Oxy IR/ROXICODONE ) immediate release tablet 2.5 mg (2.5 mg Oral Given 01/08/24 1834)  iohexol  (OMNIPAQUE ) 350 MG/ML injection 75 mL (75 mLs Intravenous Contrast Given 01/08/24 1851)     IMPRESSION / MDM / ASSESSMENT AND PLAN / ED COURSE  I reviewed the triage vital signs and the nursing notes.   Patient's presentation is most consistent with acute presentation with potential threat to life or bodily function.   Patient comes in with chest pain with a fall on Eliquis .  X-ray without any evidence of pneumonia, pneumothorax but given her significant chest pain we will get CT imaging to make sure no sternal fracture, rib fractures as well as CT imaging of the head and neck to evaluate for intercranial hemorrhage, cervical fracture.   1. There stable ascending aortic aneurysm measuring 4.8 cm.  Ascending thoracic aortic aneurysm. Recommend semi-annual imaging  followup by CTA or MRA and referral to cardiothoracic surgery if not  already obtained. This recommendation follows 2010  ACCF/AHA/AATS/ACR/ASA/SCA/SCAI/SIR/STS/SVM Guidelines for the  Diagnosis and  Management of Patients With Thoracic Aortic Disease.  Circulation. 2010; 121: [id number]. Aortic aneurysm NOS (ICD10-I71.9)  2. Stable cardiomegaly.  3. Stable scarring in the right middle lobe and lingula.    Aortic Atherosclerosis (ICD10-I70.0).   Troponins are negative x 2.  BMP is reassuring slightly elevated creatinine.  CBC reassuring  CT head and neck are negative  X-ray of shoulders are negative  Discussed with family the aneurysm and they are aware of this.  Reevaluated patient she continues not have any abdominal tenderness able to lift both legs above the bed.  And attempted ambulation she reports that her arms just hurt when she tried to use her walker due to the pain.  We discussed a short course of Tylenol , oxycodone , lidocaine  patches for at home.  We discussed the concerns for oxycodone  and increasing risk for falls but given her continued pain and difficulties with ambulating I do feel like it could be beneficial for her and family would like to proceed with this.  We will attempt to try to get patient up again to see if we can get her to ambulate to make sure that she is safe going home otherwise pt may need to be border in the ER for placement.   Patient was able to ambulate with nursing staff.  Her urine is pending.  If negative will discharge patient with lidocaine  patches, short course of oxycodone  and family understand the dangers of oxycodone  but given her she was not able to stand up without the pain in her arms which I suspect is more likely musculoskeletal they did want to proceed with a short course of this  Patient's urine is positive for nitrites but otherwise no white cells or other signs for infection she really denies any urinary symptoms.  Will cover with fosfomycin out of precaution but at this time family feels comfortable with discharge home.  Patient's blood pressure was elevated but she is on midodrine .  We did discuss monitoring her blood pressures at home  and holding her midodrine  if blood pressure stays elevated like this.  It could just be related to the pain.  They will follow this up with her primary care doctor  The patient is on the cardiac monitor to evaluate for evidence of arrhythmia and/or significant heart rate changes.      FINAL CLINICAL IMPRESSION(S) / ED DIAGNOSES   Final diagnoses:  Fall, initial encounter  Chest wall pain  Rx / DC Orders   ED Discharge Orders          Ordered    oxyCODONE  (ROXICODONE ) 5 MG immediate release tablet  Every 6 hours PRN        01/08/24 2255    lidocaine  (LIDODERM ) 5 %  Every 24 hours        01/08/24 2255             Note:  This document was prepared using Dragon voice recognition software and may include unintentional dictation errors.   Ernest Ronal BRAVO, MD 01/08/24 2255    Ernest Ronal BRAVO, MD 01/08/24 2315    Ernest Ronal BRAVO, MD 01/08/24 [phone]

## 2024-01-08 NOTE — ED Notes (Signed)
 This RN and Dorian RN assisted patient with ambulation. Pt with 2 person assist and front wheel walker was able to ambulate 5 feet.

## 2024-01-08 NOTE — ED Notes (Signed)
 Pt given [place] sandwich tray. Pt is eating at this time. Snack provided for visitor as well.

## 2024-01-08 NOTE — ED Notes (Signed)
 1 inch skin tear on posterior right forearm. Wound care provided and MD made aware.

## 2024-01-08 NOTE — ED Notes (Signed)
 Pt gone to CT

## 2024-01-08 NOTE — ED Triage Notes (Signed)
 Pt to ED for fall, unknown reason. Reports pain to back, chest  and pain under arm pits.

## 2024-01-10 LAB — URINE CULTURE

## 2024-01-18 DIAGNOSIS — L989 Disorder of the skin and subcutaneous tissue, unspecified: Secondary | ICD-10-CM | POA: Diagnosis not present

## 2024-01-18 DIAGNOSIS — I4891 Unspecified atrial fibrillation: Secondary | ICD-10-CM | POA: Diagnosis not present

## 2024-01-18 DIAGNOSIS — I2510 Atherosclerotic heart disease of native coronary artery without angina pectoris: Secondary | ICD-10-CM | POA: Diagnosis not present

## 2024-01-18 DIAGNOSIS — F32A Depression, unspecified: Secondary | ICD-10-CM | POA: Diagnosis not present

## 2024-01-18 DIAGNOSIS — I7121 Aneurysm of the ascending aorta, without rupture: Secondary | ICD-10-CM | POA: Diagnosis not present

## 2024-02-07 DIAGNOSIS — C44609 Unspecified malignant neoplasm of skin of left upper limb, including shoulder: Secondary | ICD-10-CM | POA: Diagnosis not present

## 2024-02-07 NOTE — H&P (View-Only) (Signed)
 Subjective:   CC: Malignant neoplasm of skin, upper extremity, left [C44.609]  History of Present Illness Caroline Aguilar is a 89 year old female who presents for follow-up of a chronic lesion on her left elbow. She is accompanied by her daughter-in-law.  The lesion on her left elbow has been present for over ten years and began bleeding intermittently two years ago. Bleeding occurs during activities such as changing clothes or showering, leading to stains on her gown and chair. The lesion causes pain and irritation, particularly when clothing rubs against it. She manages bleeding at home by applying pressure. She is allergic to penicillins and has adverse reactions to steroids like prednisone . Her family history includes various cancers, but no history of skin cancer.  Patient has a history of afib and aortic stenosis and is on Eliquis .    Past Medical History:  has a past medical history of Angina pectoris, Barrett's esophagus (07/08/2015), Carotid stenosis, right, Colon cancer (CMS/HHS-HCC), Glaucoma (increased eye pressure), History of stroke, Hyperlipidemia, Neuropathy, Personal history of colonic polyps, Reflux esophagitis (07/08/2015), Shingles, and Trigeminal neuralgia.  Past Surgical History:  has a past surgical history that includes RIGHT TIBIA FRACTURE; RIGHT BREAST BIOPSY; Hysterectomy; BLADDER TACK; Colon surgery (2007); Colonoscopy (05/19/2005 and 05/15/2006); LEFT HIP FRACTURE STATUS POST PINNING (07/2008); L2 kyphoplasty with biopsy (01/29/14); right hip fracture (5/16); egd (07/08/2015); and Kyphoplasty (N/A, 12/17/2019).  Family History: family history includes Breast cancer in her paternal aunt; Cancer in her mother; Liver cancer in her son.  Social History:  reports that she has quit smoking. She has never used smokeless tobacco. She reports that she does not drink alcohol and does not use drugs.  Current Medications: has a current medication list which includes the following  prescription(s): acetaminophen , bupropion , combigan , docosahexaenoic acid/epa, docusate, eliquis , gabapentin , latanoprost , midodrine , sertraline , simvastatin , lidocaine , and oxycodone .  Allergies:  Allergies  Allergen Reactions  . Penicillins Anaphylaxis and Other (See Comments)  . Cortisone Hallucination and Liver Disorder  . Meloxicam  Unknown and Other (See Comments)    Increased risk of bleeding with apixaban , per son  . Azithromycin  Unknown and Other (See Comments)  . Effexor [Venlafaxine] Dizziness  . Famciclovir Unknown and Other (See Comments)  . Morphine  Other (See Comments)  . Streptomycin Unknown and Other (See Comments)  . Tramadol  Hallucination    ROS:  A 15 point review of systems was performed and pertinent positives and negatives noted in HPI   Objective:     BP 120/66   Pulse 65   Ht 157.5 cm (5' 2)   Wt 71.7 kg (158 lb)   BMI 28.90 kg/m   Constitutional :  No distress, cooperative, alert  Skin: Cool and moist, large, non-healing bleeding lesion on skin near left elbow. No signs of infection such as drainage.   Psychiatric: Normal affect, non-agitated, not confused     LABS:  None    RADS: None    Assessment and Plan:     1. Malignant neoplasm of skin, upper extremity, left [C44.609]  Assessment & Plan Chronic non-healing bleeding lesion of left elbow Chronic lesion with bleeding episodes exacerbated by anticoagulation therapy. No skin cancer history. Excision considered based on bleeding risk and lesion size. Discussed surgical excision.  Alternatives include continued observation.  Benefits include possible symptom relief, pathologic evaluation, improved cosmesis. Will need cardiac clearance to stop Eliquis  two days before the surgery.   The patient verbalized understanding and all questions were answered to the patient's satisfaction.  2. Patient has elected  to proceed with surgical treatment. Procedure will be scheduled.

## 2024-02-08 DIAGNOSIS — Z7901 Long term (current) use of anticoagulants: Secondary | ICD-10-CM | POA: Diagnosis not present

## 2024-02-08 DIAGNOSIS — Z7689 Persons encountering health services in other specified circumstances: Secondary | ICD-10-CM | POA: Diagnosis not present

## 2024-02-08 DIAGNOSIS — I7121 Aneurysm of the ascending aorta, without rupture: Secondary | ICD-10-CM | POA: Diagnosis not present

## 2024-02-08 DIAGNOSIS — I350 Nonrheumatic aortic (valve) stenosis: Secondary | ICD-10-CM | POA: Diagnosis not present

## 2024-02-08 DIAGNOSIS — Z953 Presence of xenogenic heart valve: Secondary | ICD-10-CM | POA: Diagnosis not present

## 2024-02-08 DIAGNOSIS — I480 Paroxysmal atrial fibrillation: Secondary | ICD-10-CM | POA: Diagnosis not present

## 2024-02-08 DIAGNOSIS — Z8679 Personal history of other diseases of the circulatory system: Secondary | ICD-10-CM | POA: Diagnosis not present

## 2024-02-12 ENCOUNTER — Ambulatory Visit: Payer: Self-pay | Admitting: General Surgery

## 2024-02-13 ENCOUNTER — Encounter
Admission: RE | Admit: 2024-02-13 | Discharge: 2024-02-13 | Disposition: A | Discharge status: Home / Self Care | Source: Ambulatory Visit | Attending: General Surgery | Admitting: General Surgery

## 2024-02-13 ENCOUNTER — Other Ambulatory Visit: Payer: Self-pay

## 2024-02-13 ENCOUNTER — Encounter: Payer: Self-pay | Admitting: General Surgery

## 2024-02-13 HISTORY — DX: Dyspnea, unspecified: R06.00

## 2024-02-13 HISTORY — DX: Anemia, unspecified: D64.9

## 2024-02-13 HISTORY — DX: Anxiety disorder, unspecified: F41.9

## 2024-02-13 HISTORY — DX: Depression, unspecified: F32.A

## 2024-02-13 HISTORY — DX: Unspecified osteoarthritis, unspecified site: M19.90

## 2024-02-13 NOTE — Pre-Procedure Instructions (Signed)
 PAT call completed with Patients son [name] and wife [name], patient is very HOH.

## 2024-02-13 NOTE — Patient Instructions (Addendum)
 Your procedure is scheduled on: 02/15/24 Report to the Registration Desk on the 1st floor of the [facility]. To find out your arrival time, please call [phone] between 1PM - 3PM on: 02/14/24 If your arrival time is 6:00 am, do not arrive before that time as the [facility] entrance doors do not open until 6:00 am.  REMEMBER: Instructions that are not followed completely may result in serious medical risk, up to and including death; or upon the discretion of your surgeon and anesthesiologist your surgery may need to be rescheduled.  Do not eat food or drink any liquids after midnight the night before surgery.  No gum chewing or hard candies.  One week prior to surgery: Stop Anti-inflammatories (NSAIDS) such as Advil, Aleve , Ibuprofen, Motrin, Naproxen , Naprosyn  and Aspirin  based products such as Excedrin, Goody's Powder, BC Powder. You may take Tylenol  if needed for pain up until the day of surgery.  Stop ANY OVER THE COUNTER supplements until after surgery.  STOP Eliquis  beginning 02/13/24, may resume with doctors instructions.  ON THE DAY OF SURGERY ONLY TAKE THESE MEDICATIONS WITH SIPS OF WATER:  buPROPion  (WELLBUTRIN  XL)  mometasone -formoterol  (DULERA )  sertraline  (ZOLOFT )  midodrine  (PROAMATINE )   No Alcohol for 24 hours before or after surgery.  No Smoking including e-cigarettes for 24 hours before surgery.  No chewable tobacco products for at least 6 hours before surgery.  No nicotine patches on the day of surgery.  Do not use any recreational drugs for at least a week (preferably 2 weeks) before your surgery.  Please be advised that the combination of cocaine and anesthesia may have negative outcomes, up to and including death. If you test positive for cocaine, your surgery will be cancelled.  On the morning of surgery brush your teeth with toothpaste and water, you may rinse your mouth with mouthwash if you wish. Do not swallow any toothpaste or  mouthwash.  Do not wear jewelry, make-up, hairpins, clips or nail polish.  For welded (permanent) jewelry: bracelets, anklets, waist bands, etc.  Please have this removed prior to surgery.  If it is not removed, there is a chance that hospital personnel will need to cut it off on the day of surgery.  Do not wear lotions, powders, or perfumes.   Do not shave body hair from the neck down 48 hours before surgery.  Contact lenses, hearing aids and dentures may not be worn into surgery.  Do not bring valuables to the hospital. [facility] is not responsible for any missing/lost belongings or valuables.   Notify your doctor if there is any change in your medical condition (cold, fever, infection).  Wear comfortable clothing (specific to your surgery type) to the hospital.  After surgery, you can help prevent lung complications by doing breathing exercises.  Take deep breaths and cough every 1-2 hours. Your doctor may order a device called an Incentive Spirometer to help you take deep breaths.  When coughing or sneezing, hold a pillow firmly against your incision with both hands. This is called "splinting." Doing this helps protect your incision. It also decreases belly discomfort.  If you are being admitted to the hospital overnight, leave your suitcase in the car. After surgery it may be brought to your room.  In case of increased patient census, it may be necessary for you, the patient, to continue your postoperative care in the Same Day Surgery department.  If you are being discharged the day of surgery, you will not be allowed to  drive home. You will need a responsible individual to drive you home and stay with you for 24 hours after surgery.   If you are taking public transportation, you will need to have a responsible individual with you.  Please call the Pre-admissions Testing Dept. at [phone] if you have any questions about these instructions.  Surgery Visitation  Policy:  Patients having surgery or a procedure may have two visitors.  Children under the age of 18 must have an adult with them who is not the patient.  Inpatient Visitation:    Visiting hours are 7 a.m. to 8 p.m. Up to four visitors are allowed at one time in a patient room. The visitors may rotate out with other people during the day.  One visitor age 30 or older may stay with the patient overnight and must be in the room by 8 p.m.   Merchandiser, Retail to address health-related social needs:  [email or web address]                                                                                                             Preparing for Surgery with CHLORHEXIDINE  GLUCONATE (CHG) Soap  Chlorhexidine  Gluconate (CHG) Soap  o An antiseptic cleaner that kills germs and bonds with the skin to continue killing germs even after washing  o Used for showering the night before surgery and morning of surgery  Before surgery, you can play an important role by reducing the number of germs on your skin.  CHG (Chlorhexidine  gluconate) soap is an antiseptic cleanser which kills germs and bonds with the skin to continue killing germs even after washing.  Please do not use if you have an allergy to CHG or antibacterial soaps. If your skin becomes reddened/irritated stop using the CHG.  1. Shower the NIGHT BEFORE SURGERY with CHG soap.  2. If you choose to wash your hair, wash your hair first as usual with your normal shampoo.  3. After shampooing, rinse your hair and body thoroughly to remove the shampoo.  4. Use CHG as you would any other liquid soap. You can apply CHG directly to the skin and wash gently with a clean washcloth.  5. Apply the CHG soap to your body only from the neck down. Do not use on open wounds or open sores. Avoid contact with your eyes, ears, mouth, and genitals (private parts). Wash face and genitals (private parts) with your normal soap.  6. Wash  thoroughly, paying special attention to the area where your surgery will be performed.  7. Thoroughly rinse your body with warm water.  8. Do not shower/wash with your normal soap after using and rinsing off the CHG soap.  9. Do not use lotions, oils, etc., after showering with CHG.  10. Pat yourself dry with a clean towel.  11. Wear clean pajamas to bed the night before surgery.  12. Place clean sheets on your bed the night of your shower and do not sleep with pets.  13. Do not apply any deodorants/lotions/powders.  14. Please  wear clean clothes to the hospital.  15. Remember to brush your teeth with your regular toothpaste.

## 2024-02-14 ENCOUNTER — Encounter: Payer: Self-pay | Admitting: General Surgery

## 2024-02-14 NOTE — Progress Notes (Signed)
 Perioperative / Anesthesia Services  Pre-Admission Testing Clinical Review / Pre-Operative Anesthesia Consult  Date: 02/14/24  PATIENT DEMOGRAPHICS: Name: Caroline Aguilar DOB: Oct 13, 1925 MRN:   [id number]  Note: Available PAT nursing documentation and vital signs have been reviewed. Clinical nursing staff has updated patient's PMH/PSHx, current medication list, and drug allergies/intolerances to ensure complete and comprehensive history available to assist care teams in MDM as it pertains to the aforementioned surgical procedure and anticipated anesthetic course. Extensive review of available clinical information personally performed. Nursing documentation reviewed. [place] [facility] and PSHx updated with any diagnoses and/or procedures that I have knowledge of that may have been inadvertently omitted during her intake with the pre-admission testing department's nursing staff.  PLANNED SURGICAL PROCEDURE(S):   Case: [id number] Date/Time: 02/15/24 1045   Procedure: EXCISION MASS UPPER EXTREMITIES (Left: Elbow)   Anesthesia type: General   Diagnosis: Malignant neoplasm of skin, upper extremity, left [C44.609]   Pre-op diagnosis: Malignant neoplasm of skin, upper extremity, left C44.609   Location: [facility] OR ROOM 09 / [facility]   Surgeons: Rodolph Romano, MD        CLINICAL DISCUSSION: Caroline Aguilar is a 89 y.o. female who is submitted for pre-surgical anesthesia review and clearance prior to her undergoing the above procedure. Patient is a Former Smoker (quit 10/1945). Pertinent PMH includes: CAD, HFpEF, PAF, severe aortic stenosis (s/p TAVR), pulmonary hypertension, thoracic aortic aneurysm, CVA, chronic cerebral microvascular disease, BILATERAL carotid artery disease, aortic atherosclerosis, cardiomegaly, labile HTN, HLD, dyspnea, GERD (on daily PPI), anemia, malignant skin neoplasm, OA, cervical DDD, neuropathy, frequent falls, depression, anxiety.  Patient is  followed by cardiology Edra, MD). She was last seen in the cardiology clinic on 02/08/2024; notes reviewed. At the time of her clinic visit, patient reporting a recent episode of chest pain.  She was seen in the ED and cardiac workup was negative, including 2 sets of enzymes.  Patient with frequent shortness of breath both at rest and with exertion. Patient denied any PND, orthopnea, palpitations, significant peripheral edema, weakness, fatigue, vertiginous symptoms, or presyncope/syncope. Patient with a past medical history significant for cardiovascular diagnoses. Documented physical exam was grossly benign, providing no evidence of acute exacerbation and/or decompensation of the patient's known cardiovascular conditions.  Patient underwent diagnostic RIGHT/LEFT heart catheterization on 07/19/2015.  Study revealed normal left ventricular systolic function and mild nonobstructive coronary artery disease; 20% ostial-proximal LAD, 30% proximal-mid LAD, and 40% proximal RCA.  Patient with known severe aortic valve stenosis.  Multiple attempts were made to cross the aortic valve, however interventional cardiology was unsuccessful.  Patient ultimately referred to [facility] for consideration of TAVR procedure.  Patient underwent TAVR on 10/04/2015.  23 mm Edwards SAPIEN 3 Ultra Resilia bioprosthetic aortic valve was placed.  MRI imaging of the brain performed on 06/11/2017 revealed an acute versus early subacute LEFT corona radiata infarct.  Patient has no significant residual deficits following neurological event.  Patient with a history of known carotid artery disease.  Most recent carotid Doppler study performed on 06/12/2017 revealed a 50-69% stenosis of the RICA with a contralateral <50% stenosis in the LICA. Vertebral arteries demonstrated antegrade flow.  There were normal flow hemodynamics seen in the subclavian arteries.  Most recent TTE performed on 01/11/2022 revealed a  normal left ventricular systolic function with an EF of 55-60%. There was no LVH.  There were no regional wall motion abnormalities. Left ventricular diastolic Doppler parameters consistent with abnormal relaxation (G1DD). Right ventricular  size and function normal with a TAPSE measuring 1.3 cm  (normal range >/= 1.6 cm).  RVSP = ~ 48 mmHg (4  (3.27)2 + 5 = 47.8 mmHg) consistent with mild pulmonary hypertension.  Bioprosthetic aortic valve well-seated and functioning properly; mean transvalvular gradient 4.5 mmHg with no evidence of paravalvular regurgitation. There was trivial mitral and tricuspid valve regurgitation.  All transvalvular gradients were noted to be normal providing no evidence of hemodynamically significant valvular stenosis. Aorta normal in size with no evidence of ectasia or aneurysmal dilatation.  Patient with an atrial fibrillation diagnosis; CHA2DS2-VASc Score = 8 (age x 2, sex, HFpEF, HTN, CVA x 2, vascular disease). Her rate and rhythm are currently being maintained transiently without the need for pharmacological intervention.  Patient is chronically anticoagulated using reduced dose apixaban .  Patient reportedly compliant with therapy with no evidence or reports of GI/GU related bleeding.  Patient with a labile hypertension diagnosis.  Blood pressures have been running <100 systolic, however patient has not used her prescribed midodrine  in more than a week per her husband's report.  Blood pressure documented in the clinic at 88/54 mmHg.  Patient is on simvastatin  for her HLD diagnosis and further ASCVD prevention.  She is not diabetic.  Patient does not have an OSAH diagnosis.  Functional capacity limited by patient's age and multiple medical comorbidities.  She requires assistance with her ADLs/IADLs.  Patient questionably able to achieve 4 METS of physical activity without experiencing, at least to some degree, significant angina/anginal equivalent symptoms.  No changes were made to her  medication regimen.  Patient to follow-up with outpatient cardiology in 2 to 3 months or sooner if needed.  Caroline Aguilar is scheduled for an elective EXCISION MASS UPPER EXTREMITIES (Left: Elbow) on 02/15/2024 with Dr. Lucas Sjogren, MD. Given patient's past medical history significant for cardiovascular diagnoses, presurgical cardiac clearance was sought by the PAT team. Per cardiology, this patient is optimized for surgery and may proceed with the planned procedural course with a MODERATE risk of significant perioperative cardiovascular complications.  Again, this patient is on daily oral anticoagulation therapy using a DOAC medication. She has been instructed on recommendations for holding her apixaban  for 2 days prior to her procedure with plans to restart as soon as postoperative bleeding risk felt to be minimized by his primary attending surgeon. The patient has been instructed that her last dose should be on 02/12/2024.  Patient denies previous perioperative complications with anesthesia in the past. In review her EMR, it is noted that patient underwent a general anesthetic course here at [facility] ([facility]) in 12/2019 without documented complications.   MOST RECENT VITAL SIGNS:    01/08/2024   11:00 PM 01/08/2024   10:50 PM 01/08/2024    7:30 PM  Vitals with BMI  Systolic 180 207 816  Diastolic 75 81 68  Pulse 65 78 64   PROVIDERS/SPECIALISTS: NOTE: Primary physician provider listed below. Patient may have been seen by APP or partner within same practice.   PROVIDER ROLE / SPECIALTY LAST SHERLEAN Sjogren Lucas, MD General Surgery (Surgeon) 02/07/2024  Cleotilde Oneil FALCON, MD Primary Care Provider 01/18/2024  Hilarie Rocher, MD Cardiology 02/08/2024   ALLERGIES: Allergies  Allergen Reactions   Penicillins Anaphylaxis    Has tolerated 1st (CEPHALEXIN ) and 3rd (CEFTRIAXONE ) generation cephalosporins in the past with no documented ADRs.     Meloxicam  Other (See Comments)    Increased risk of bleeding with apixaban , per son  Azithromycin  Other (See Comments)    Reaction:  Unknown    Cortisone     Other reaction(s): Hallucination   Famciclovir Other (See Comments)    Reaction:  Unknown    Morphine     Streptomycin    Tramadol      Other reaction(s): Hallucination   Venlafaxine     Other reaction(s): Dizziness    CURRENT HOME MEDICATIONS: No current facility-administered medications for this encounter.    acetaminophen  (TYLENOL ) 500 MG tablet   apixaban  (ELIQUIS ) 2.5 MG TABS tablet   buPROPion  (WELLBUTRIN  XL) 150 MG 24 hr tablet   cyanocobalamin  1000 MCG tablet   docusate sodium  (COLACE) 100 MG capsule   gabapentin  (NEURONTIN ) 100 MG capsule   latanoprost  (XALATAN ) 0.005 % ophthalmic solution   midodrine  (PROAMATINE ) 2.5 MG tablet   midodrine  (PROAMATINE ) 5 MG tablet   mometasone -formoterol  (DULERA ) 200-5 MCG/ACT AERO   omeprazole (PRILOSEC) 20 MG capsule   predniSONE  (DELTASONE ) 10 MG tablet   sertraline  (ZOLOFT ) 50 MG tablet   simvastatin  (ZOCOR ) 20 MG tablet   HISTORY: Past Medical History:  Diagnosis Date   (HFpEF) heart failure with preserved ejection fraction (HCC)    Anemia    Angina pectoris    Anxiety    Aortic atherosclerosis    Arthritis    Bilateral carotid artery disease    CAD (coronary artery disease)    Cardiomegaly    Cerebral microvascular disease    Cerebral microvascular disease    Closed lumbar vertebral fracture (HCC)    a.) s/p L1 (12/17/2019) and L4 (06/11/2018) kyphoplasties   Colon cancer (HCC)    DDD (degenerative disc disease), cervical    Depression    Diverticulosis    Dyspnea    Falls frequently    GERD (gastroesophageal reflux disease)    HLD (hyperlipidemia)    HTN (hypertension)    Hyperlipidemia    Labile hypertension    a.) on scheduled midodrine    Malignant neoplasm of skin, upper extremity, left 2025   Neuropathy    On dose reduced apixaban  therapy     PAF (paroxysmal atrial fibrillation) (HCC)    a.) CHA2DS2VASc = 8 (age x 2, sex, HFpEF, HTN, CVA x 2, vascular disease) as of 02/13/2024; b.) rate/rhythm maintained intrinsically without pharmacological intervention; chronically anticoagulated with reduced dose apixaban    Right inguinal hernia    Severe aortic stenosis s/p TAVR    a.) s/p TAVR 10/04/2015 --> 23 mm Edwards SAPIEN 3 Ultra Resilia   Stroke (HCC) 06/11/2017   a.) MRI brain 06/11/2017: LEFT corona radiata acute or early subacute infarct   Thoracic aortic aneurysm    a.) stable at 4.8 cm   Past Surgical History:  Procedure Laterality Date   BONE EXCISION Right 11/14/2019   Procedure: arthroplasty right foot fifth toe;  Surgeon: Neill Boas, DPM;  Location: [facility] ORS;  Service: Podiatry;  Laterality: Right;   CARDIAC CATHETERIZATION N/A 07/19/2015   Procedure: Right and Left Heart Cath;  Surgeon: Marsa Dooms, MD;  Location: [facility] INVASIVE CV LAB;  Service: Cardiovascular;  Laterality: N/A;   CATARACT EXTRACTION Bilateral    COLON SURGERY     ESOPHAGOGASTRODUODENOSCOPY N/A 07/08/2015   Procedure: ESOPHAGOGASTRODUODENOSCOPY (EGD);  Surgeon: Deward CINDERELLA Piedmont, MD;  Location: [facility] ENDOSCOPY;  Service: Endoscopy;  Laterality: N/A;   FEMUR FRACTURE SURGERY     FOREARM SURGERY     HIP SURGERY Bilateral    KYPHOPLASTY N/A 06/11/2018   Procedure: KYPHOPLASTY L4;  Surgeon: Kathlynn Sharper, MD;  Location: [facility]  ORS;  Service: Orthopedics;  Laterality: N/A;   KYPHOPLASTY N/A 12/17/2019   Procedure: L1 Kyphoplasty;  Surgeon: Kathlynn Sharper, MD;  Location: [facility] ORS;  Service: Orthopedics;  Laterality: N/A;   TRANSCATHETER AORTIC VALVE REPLACEMENT, TRANSFEMORAL  10/04/2015   Procedure: TRANSCATHETER AORTIC VALVE REPLACEMENT, TRANSFEMORAL; Location: [facility]   Family History  Problem Relation Age of Onset   Cancer Mother    Heart failure Father    Social History   Tobacco Use   Smoking status: Former    Current packs/day: 0.00    Types:  Cigarettes    Start date: 11/10/1942    Quit date: 11/09/1945    Years since quitting: 78.3   Smokeless tobacco: Never  Substance Use Topics   Alcohol use: No   LABS:  Lab Results  Component Value Date   WBC 7.0 01/08/2024   HGB 13.6 01/08/2024   HCT 45.1 01/08/2024   MCV 99.1 01/08/2024   PLT 187 01/08/2024   Lab Results  Component Value Date   NA 139 01/08/2024   CL 103 01/08/2024   K 4.7 01/08/2024   CO2 23 01/08/2024   BUN 44 (H) 01/08/2024   CREATININE 1.16 (H) 01/08/2024   GFRNONAA 43 (L) 01/08/2024   CALCIUM  8.9 01/08/2024   PHOS 3.9 07/18/2015   ALBUMIN 3.3 (L) 01/09/2022   GLUCOSE 113 (H) 01/08/2024    ECG: Date: 01/08/2024  Time ECG obtained: 1442 PM Rate: 62 bpm Rhythm: normal sinus Axis (leads I and aVF): normal Intervals: PR 188 ms. QRS 82 ms. QTc 434 ms. ST segment and T wave changes: No evidence of acute T wave abnormalities or significant ST segment elevation or depression.  Evidence of a possible, age undetermined, prior infarct:  Yes; septal Comparison: Similar to previous tracing obtained on 08/09/2023   IMAGING / PROCEDURES: CT ANGIO CHEST AORTA W AND/OR WO CONTRAST performed on 01/08/2024 There stable ascending aortic aneurysm measuring 4.8 cm. Ascending thoracic aortic aneurysm. Recommend semi-annual imaging followup by CTA or MRA and referral to cardiothoracic surgery if not already obtained.  Stable cardiomegaly. Stable scarring in the right middle lobe and lingula. Aortic atherosclerosis   CT CERVICAL SPINE WO CONTRAST performed on 01/08/2024 Multilevel degenerative changes.  No acute bony abnormality.   CT HEAD WO CONTRAST performed on 01/08/2024 Atrophy Chronic microvascular disease. No acute intracranial abnormality.  CT CHEST ABDOMEN PELVIS W CONTRAST performed on 08/09/2023 No acute traumatic injury seen in the chest, abdomen or pelvis. 4.8 cm ascending thoracic aortic aneurysm. Recommend semi-annual imaging followup by CTA or  MRA and referral to cardiothoracic surgery if not already obtained.   Status post transcatheter aortic valve repair. Sigmoid diverticulosis without inflammation. Small right inguinal hernia is noted which contains a loop of small bowel, but does not result in obstruction.  Aortic atherosclerosis  TRANSTHORACIC ECHOCARDIOGRAM performed on 01/11/2022 Left ventricular ejection fraction, by estimation, is 55 to 60%. The left ventricle has normal function. The left ventricle has no regional wall motion abnormalities. Left ventricular diastolic parameters are consistent with Grade I diastolic dysfunction (impaired relaxation).  Right ventricular systolic function is normal. The right ventricular size is normal.  The mitral valve is normal in structure. Trivial mitral valve regurgitation.  The aortic valve is normal in structure. Aortic valve regurgitation is not visualized.   RIGHT/LEFT HEART CATHETERIZATION AND CORONARY ANGIOGRAPHY performed on 07/19/2015 Insignificant coronary artery disease 20% o-pLAD 30% pLAD 40% pRCA Multiple unsuccessful attempts to cross aortic valve due to severe aortic stenosis Recommendations: referral to [facility]  for TAVR    IMPRESSION AND PLAN: Caroline Aguilar has been referred for pre-anesthesia review and clearance prior to her undergoing the planned anesthetic and procedural courses. Available labs, pertinent testing, and imaging results were personally reviewed by me in preparation for upcoming operative/procedural course. [facility] medical record has been updated following extensive record review and patient interview with PAT staff.   This patient has been appropriately cleared by cardiology with an overall MODERATE risk of patient experiencing significant perioperative cardiovascular complications. Based on clinical review performed today (02/14/24), barring any significant acute changes in the patient's overall condition, it is anticipated that she will be able  to proceed with the planned surgical intervention. Any acute changes in clinical condition may necessitate her procedure being postponed and/or cancelled. Patient will meet with anesthesia team (MD and/or CRNA) on the day of her procedure for preoperative evaluation/assessment. Questions regarding anesthetic course will be fielded at that time.   Pre-surgical instructions were reviewed with the patient during his PAT appointment, and questions were fielded to satisfaction by PAT clinical staff. She has been instructed on which medications that she will need to hold prior to surgery, as well as the ones that have been deemed safe/appropriate to take on the day of her procedure. As part of the general education provided by PAT, patient made aware both verbally and in writing, that she would need to abstain from the use of any illegal substances during her perioperative course. She was advised that failure to follow the provided instructions could necessitate case cancellation or result in serious perioperative complications up to and including death. Patient encouraged to contact PAT and/or her surgeon's office to discuss any questions or concerns that may arise prior to surgery; verbalized understanding.   Dorise Pereyra, MSN, APRN, FNP-C, CEN [facility]  Perioperative Services Nurse Practitioner Phone: [phone] Fax: [phone] 02/14/24 8:36 AM  NOTE: This note has been prepared using Dragon dictation software. Despite my best ability to proofread, there is always the potential that unintentional transcriptional errors may still occur from this process.

## 2024-02-14 NOTE — Progress Notes (Signed)
 Perioperative Services Pre-Admission/Anesthesia Testing   Date: 02/14/24 Name: Caroline Aguilar MRN:   [id number]  Re: Consideration of preoperative prophylactic antibiotic change   Request sent to: Rodolph Romano, MD (routed and/or faxed via [facility])  Planned Surgical Procedure(s):     Case: [id number] Date/Time: 02/15/24 1045   Procedure: EXCISION MASS UPPER EXTREMITIES (Left: Elbow)   Anesthesia type: General   Diagnosis: Malignant neoplasm of skin, upper extremity, left [C44.609]   Pre-op diagnosis: Malignant neoplasm of skin, upper extremity, left C44.609   Location: [facility] OR ROOM 09 / [facility]   Surgeons: Rodolph Romano, MD        Clinical Notes:  Patient has a documented allergy/intolerance to PCN  Advising that PCN has caused her to experience anaphylactic type reactions in the past.   EMR review indicated that patient received PCN and/or cephalosporin in the past as follows: Patient has tolerated both 1st (CEPHALEXIN ) and 3rd (CEFTRIAXONE ) generation cephalosporins in the past with no documented ADRs. Allergy section in EMR updated to reflect tolerance.    Request:  As an evidence based approach to reducing the rate of incidence for post-operative SSI and the development of MDROs, could an agent that allows for narrower antimicrobial coverage for preoperative prophylaxis in this patient's upcoming surgical course be considered?   Currently ordered preoperative prophylactic ABX: vancomycin.   Specifically requesting change to cephalosporin (CEFAZOLIN).   Drug of choice for many procedures; it is the most widely studied antimicrobial agent with proven efficacy for antimicrobial prophylaxis.   Desirable duration of action, spectrum of activity against organisms commonly encountered in surgery, and it has an excellent safety profile and low cost.   Active against streptococci, methicillin-susceptible staphylococci, and many gram-negative  organisms.  Despite being a first-generation cephalosporin, cefazolin is structurally different than other cephalosporins. Cefazolin has two side chains (R1 and R2) that have structures that do not match those of any other FDA-approved ?-lactams or PCN.  True allergies to cefazolin are extremely rare (<1%) and are usually specific for its side chains, not the shared core ring.  Please communicate decision with me and I will change the orders in Epic as per your direction.    Things to consider: Many patients report that they were allergic to PCN earlier in life, however this does not translate into a true lifelong allergy. Patients can lose sensitivity to specific IgE antibodies over time if PCN is avoided (Kleris & Lugar, 2019).  Penicillin allergies are reported by 8% to 15% of the [place]  population, but up to 95% of these allergies do not correspond to a true allergy when tested Mathias et al., 2022).   Cross-sensitivity between PCN and cephalosporins has been documented as being as high as 10%, however this estimation included data believed to have been collected in a setting where there was contamination. Newer data suggests that the prevalence of cross-sensitivity between PCN and cephalosporins is actually estimated to be closer to 1% (Hermanides et al., 2018).   Patients labeled as PCN allergic, whether they are truly allergic or not, have been found to have inferior outcomes in terms of rates of serious infection, and these patients tend to have longer hospital stays [facility] & Lugar, 2019).  Treatment related secondary infections, such as Clostridioides difficile, have been linked to the improper use of broad spectrum antibiotics in patients improperly labeled as PCN allergic (Kleris & Lugar, 2019).  Anaphylaxis from cephalosporins is rare and the evidence suggests that there is no increased risk of  an anaphylactic type reaction when cephalosporins are used in a PCN allergic patient  Runner, Broadcasting/film/video, 2006).  Citations: Hermanides J, Lemkes BA, Prins ONA Dose MW, Terreehorst I. Presumed ?-Lactam Allergy and Cross-reactivity in the Operating Theater: A Practical Approach. Anesthesiology. 2018 Aug;129(2):335-342. doi: 10.1097/ALN.0000000000002252. PMID: 70237819.  Kleris, R. S., & Lugar, P. L. (2019). Things We Do For No Reason: Failing to Question a Penicillin Allergy History. Journal of hospital medicine, 14(10), [phone]. Advance online publication. airportbarriers.com  Pichichero, M. E. (2006). Cephalosporins can be prescribed safely for penicillin-allergic patients. Journal of family medicine, 55(2), 106-112. Accessed: https://cdn.mdedge.com/files/s79fs-public/Document/September-2017/5502JFP_AppliedEvidence1.pdf  Mikki CANDIE Bethena KYM RONAL Mickey Beverley JUDITHANN DELENA., & Estelle, D. R. (2022). Understanding Penicillin Allergy, Cross-reactivity, and Antibiotic Selection in the Preoperative Setting. The Journal of the American Academy of Orthopaedic Surgeons, 30(1), e1-e5. Callrank.tn   Dorise Pereyra, MSN, APRN, FNP-C, CEN [facility]  Perioperative Services Nurse Practitioner Phone: [phone] Fax: [phone] 02/14/24 8:17 AM  NOTE: This note has been prepared using Dragon dictation software. Despite my best ability to proofread, there is always the potential that unintentional transcriptional errors may still occur from this process.

## 2024-02-15 ENCOUNTER — Encounter: Payer: Self-pay | Admitting: Urgent Care

## 2024-02-15 ENCOUNTER — Ambulatory Visit
Admission: RE | Admit: 2024-02-15 | Discharge: 2024-02-15 | Disposition: A | Discharge status: Home / Self Care | Attending: General Surgery | Admitting: General Surgery

## 2024-02-15 ENCOUNTER — Encounter
Admission: RE | Disposition: A | Discharge status: Home / Self Care | Payer: Self-pay | Source: Home / Self Care | Attending: General Surgery

## 2024-02-15 ENCOUNTER — Ambulatory Visit: Payer: Self-pay | Admitting: Urgent Care

## 2024-02-15 ENCOUNTER — Other Ambulatory Visit: Payer: Self-pay

## 2024-02-15 ENCOUNTER — Encounter: Payer: Self-pay | Admitting: General Surgery

## 2024-02-15 DIAGNOSIS — I2510 Atherosclerotic heart disease of native coronary artery without angina pectoris: Secondary | ICD-10-CM | POA: Insufficient documentation

## 2024-02-15 DIAGNOSIS — I350 Nonrheumatic aortic (valve) stenosis: Secondary | ICD-10-CM | POA: Diagnosis not present

## 2024-02-15 DIAGNOSIS — Z8249 Family history of ischemic heart disease and other diseases of the circulatory system: Secondary | ICD-10-CM | POA: Insufficient documentation

## 2024-02-15 DIAGNOSIS — Z8673 Personal history of transient ischemic attack (TIA), and cerebral infarction without residual deficits: Secondary | ICD-10-CM | POA: Insufficient documentation

## 2024-02-15 DIAGNOSIS — I272 Pulmonary hypertension, unspecified: Secondary | ICD-10-CM | POA: Diagnosis not present

## 2024-02-15 DIAGNOSIS — Z87891 Personal history of nicotine dependence: Secondary | ICD-10-CM | POA: Insufficient documentation

## 2024-02-15 DIAGNOSIS — F32A Depression, unspecified: Secondary | ICD-10-CM | POA: Diagnosis not present

## 2024-02-15 DIAGNOSIS — Z953 Presence of xenogenic heart valve: Secondary | ICD-10-CM | POA: Diagnosis not present

## 2024-02-15 DIAGNOSIS — I700 Atherosclerosis of aorta: Secondary | ICD-10-CM | POA: Insufficient documentation

## 2024-02-15 DIAGNOSIS — C44609 Unspecified malignant neoplasm of skin of left upper limb, including shoulder: Secondary | ICD-10-CM | POA: Diagnosis not present

## 2024-02-15 DIAGNOSIS — I110 Hypertensive heart disease with heart failure: Secondary | ICD-10-CM | POA: Diagnosis not present

## 2024-02-15 DIAGNOSIS — Z7901 Long term (current) use of anticoagulants: Secondary | ICD-10-CM | POA: Insufficient documentation

## 2024-02-15 DIAGNOSIS — F419 Anxiety disorder, unspecified: Secondary | ICD-10-CM | POA: Insufficient documentation

## 2024-02-15 DIAGNOSIS — R296 Repeated falls: Secondary | ICD-10-CM | POA: Insufficient documentation

## 2024-02-15 DIAGNOSIS — I480 Paroxysmal atrial fibrillation: Secondary | ICD-10-CM | POA: Diagnosis not present

## 2024-02-15 DIAGNOSIS — I5032 Chronic diastolic (congestive) heart failure: Secondary | ICD-10-CM | POA: Diagnosis not present

## 2024-02-15 DIAGNOSIS — K219 Gastro-esophageal reflux disease without esophagitis: Secondary | ICD-10-CM | POA: Diagnosis not present

## 2024-02-15 DIAGNOSIS — C44619 Basal cell carcinoma of skin of left upper limb, including shoulder: Secondary | ICD-10-CM | POA: Diagnosis not present

## 2024-02-15 DIAGNOSIS — M1990 Unspecified osteoarthritis, unspecified site: Secondary | ICD-10-CM | POA: Diagnosis not present

## 2024-02-15 DIAGNOSIS — G629 Polyneuropathy, unspecified: Secondary | ICD-10-CM | POA: Insufficient documentation

## 2024-02-15 DIAGNOSIS — Z79899 Other long term (current) drug therapy: Secondary | ICD-10-CM | POA: Diagnosis not present

## 2024-02-15 DIAGNOSIS — E785 Hyperlipidemia, unspecified: Secondary | ICD-10-CM | POA: Insufficient documentation

## 2024-02-15 HISTORY — DX: Cardiomegaly: I51.7

## 2024-02-15 HISTORY — DX: Diverticulosis of intestine, part unspecified, without perforation or abscess without bleeding: K57.90

## 2024-02-15 HISTORY — PX: EXCISION MASS UPPER EXTREMETIES: SHX6704

## 2024-02-15 HISTORY — DX: Unspecified fracture of unspecified lumbar vertebra, initial encounter for closed fracture: S32.009A

## 2024-02-15 HISTORY — DX: Unspecified diastolic (congestive) heart failure: I50.30

## 2024-02-15 HISTORY — DX: Thoracic aortic aneurysm, without rupture, unspecified: I71.20

## 2024-02-15 HISTORY — DX: Essential (primary) hypertension: I10

## 2024-02-15 HISTORY — DX: Disorder of arteries and arterioles, unspecified: I77.9

## 2024-02-15 HISTORY — DX: Atherosclerotic heart disease of native coronary artery without angina pectoris: I25.10

## 2024-02-15 HISTORY — DX: Unilateral inguinal hernia, without obstruction or gangrene, not specified as recurrent: K40.90

## 2024-02-15 HISTORY — DX: Other cerebrovascular disease: I67.89

## 2024-02-15 HISTORY — DX: Paroxysmal atrial fibrillation: I48.0

## 2024-02-15 HISTORY — DX: Other specified symptoms and signs involving the circulatory and respiratory systems: R09.89

## 2024-02-15 HISTORY — DX: Atherosclerosis of aorta: I70.0

## 2024-02-15 HISTORY — DX: Hyperlipidemia, unspecified: E78.5

## 2024-02-15 HISTORY — DX: Pulmonary hypertension, unspecified: I27.20

## 2024-02-15 HISTORY — DX: Long term (current) use of anticoagulants: Z79.01

## 2024-02-15 HISTORY — DX: Other cervical disc degeneration, unspecified cervical region: M50.30

## 2024-02-15 HISTORY — DX: Malignant neoplasm of colon, unspecified: C18.9

## 2024-02-15 HISTORY — DX: Nonrheumatic aortic (valve) stenosis: I35.0

## 2024-02-15 SURGERY — EXCISION MASS UPPER EXTREMITIES
Anesthesia: General | Site: Elbow | Laterality: Left | Wound class: Clean

## 2024-02-15 MED ORDER — HYDROCODONE-ACETAMINOPHEN 5-325 MG PO TABS
1.0000 | ORAL_TABLET | Freq: Four times a day (QID) | ORAL | 0 refills | Status: AC | PRN
Start: 2024-02-15 — End: 2024-02-18
  Filled 2024-02-15: qty 12, 3d supply, fill #0

## 2024-02-15 MED ORDER — ONDANSETRON HCL 4 MG/2ML IJ SOLN: 4.0000 mg | Freq: Once | INTRAMUSCULAR | Status: DC | PRN

## 2024-02-15 MED ORDER — PROPOFOL 10 MG/ML IV BOLUS
INTRAVENOUS | Status: DC | PRN
  Administered 2024-02-15 (×5): 10 mg via INTRAVENOUS

## 2024-02-15 MED ORDER — CHLORHEXIDINE GLUCONATE 0.12 % MT SOLN
OROMUCOSAL | Status: AC
  Filled 2024-02-15: qty 15

## 2024-02-15 MED ORDER — FENTANYL CITRATE (PF) 100 MCG/2ML IJ SOLN
INTRAMUSCULAR | Status: DC | PRN
  Administered 2024-02-15 (×2): 12.5 ug via INTRAVENOUS

## 2024-02-15 MED ORDER — BUPIVACAINE-EPINEPHRINE (PF) 0.25% -1:200000 IJ SOLN
INTRAMUSCULAR | Status: AC
Start: 2024-02-15 — End: 2024-02-15
  Filled 2024-02-15: qty 30

## 2024-02-15 MED ORDER — VANCOMYCIN HCL IN DEXTROSE 1-5 GM/200ML-% IV SOLN
1000.0000 mg | INTRAVENOUS | Status: AC
  Administered 2024-02-15: 1000 mg via INTRAVENOUS

## 2024-02-15 MED ORDER — CHLORHEXIDINE GLUCONATE 0.12 % MT SOLN: 15.0000 mL | Freq: Once | OROMUCOSAL | Status: DC

## 2024-02-15 MED ORDER — VANCOMYCIN HCL IN DEXTROSE 1-5 GM/200ML-% IV SOLN
INTRAVENOUS | Status: AC
  Filled 2024-02-15: qty 200

## 2024-02-15 MED ORDER — FENTANYL CITRATE (PF) 100 MCG/2ML IJ SOLN: 25.0000 ug | INTRAMUSCULAR | Status: DC | PRN

## 2024-02-15 MED ORDER — PROPOFOL 10 MG/ML IV BOLUS
INTRAVENOUS | Status: AC
Start: 2024-02-15 — End: 2024-02-15
  Filled 2024-02-15: qty 20

## 2024-02-15 MED ORDER — 0.9 % SODIUM CHLORIDE (POUR BTL) OPTIME
TOPICAL | Status: DC | PRN
  Administered 2024-02-15: 500 mL

## 2024-02-15 MED ORDER — PROPOFOL 1000 MG/100ML IV EMUL
INTRAVENOUS | Status: AC
Start: 2024-02-15 — End: 2024-02-15
  Filled 2024-02-15: qty 100

## 2024-02-15 MED ORDER — BUPIVACAINE-EPINEPHRINE (PF) 0.25% -1:200000 IJ SOLN
INTRAMUSCULAR | Status: DC | PRN
  Administered 2024-02-15: 30 mL via PERINEURAL

## 2024-02-15 MED ORDER — ORAL CARE MOUTH RINSE: 15.0000 mL | Freq: Once | OROMUCOSAL | Status: DC

## 2024-02-15 MED ORDER — LACTATED RINGERS IV SOLN: INTRAVENOUS | Status: DC

## 2024-02-15 MED ORDER — FENTANYL CITRATE (PF) 100 MCG/2ML IJ SOLN
INTRAMUSCULAR | Status: AC
  Filled 2024-02-15: qty 2

## 2024-02-15 SURGICAL SUPPLY — 26 items
CHLORAPREP W/TINT 26 (MISCELLANEOUS) ×1 IMPLANT
DERMABOND ADVANCED .7 DNX12 (GAUZE/BANDAGES/DRESSINGS) ×1 IMPLANT
DRAPE LAPAROTOMY 77X122 PED (DRAPES) IMPLANT
DRAPE SHEET LG 3/4 BI-LAMINATE (DRAPES) ×1 IMPLANT
DRSG MEPILEX FLEX 3X3 (GAUZE/BANDAGES/DRESSINGS) IMPLANT
ELECTRODE REM PT RTRN 9FT ADLT (ELECTROSURGICAL) ×1 IMPLANT
GLOVE BIO SURGEON STRL SZ 6.5 (GLOVE) ×1 IMPLANT
GLOVE BIOGEL PI IND STRL 6.5 (GLOVE) ×1 IMPLANT
GLOVE SURG SYN 6.5 PF PI (GLOVE) ×2 IMPLANT
GOWN STRL REUS W/ TWL LRG LVL3 (GOWN DISPOSABLE) ×3 IMPLANT
KIT TURNOVER KIT A (KITS) ×1 IMPLANT
LABEL OR SOLS (LABEL) ×1 IMPLANT
MANIFOLD NEPTUNE II (INSTRUMENTS) ×1 IMPLANT
NDL HYPO 22X1.5 SAFETY MO (MISCELLANEOUS) ×1 IMPLANT
NEEDLE HYPO 22X1.5 SAFETY MO (MISCELLANEOUS) ×1 IMPLANT
NS IRRIG 500ML POUR BTL (IV SOLUTION) ×1 IMPLANT
PACK EXTREMITY ARMC (MISCELLANEOUS) ×1 IMPLANT
PAD PREP OB/GYN DISP 24X41 (PERSONAL CARE ITEMS) ×1 IMPLANT
PENCIL SMOKE EVACUATOR (MISCELLANEOUS) ×1 IMPLANT
STOCKINETTE BIAS CUT 4 980044 (GAUZE/BANDAGES/DRESSINGS) ×1 IMPLANT
SUT VIC AB 2-0 SH 27XBRD (SUTURE) ×1 IMPLANT
SUT VIC AB 3-0 SH 27X BRD (SUTURE) ×1 IMPLANT
SUTURE MNCRL 4-0 27XMF (SUTURE) ×1 IMPLANT
SYR 10ML LL (SYRINGE) ×1 IMPLANT
TRAP FLUID SMOKE EVACUATOR (MISCELLANEOUS) ×1 IMPLANT
WATER STERILE IRR 500ML POUR (IV SOLUTION) ×1 IMPLANT

## 2024-02-15 NOTE — Anesthesia Postprocedure Evaluation (Signed)
 Anesthesia Post Note  Patient: Caroline Aguilar  Procedure(s) Performed: EXCISION MASS UPPER EXTREMITIES (Left: Elbow)  Patient location during evaluation: PACU Level of consciousness: awake Pain management: pain level controlled Vital Signs Assessment: post-procedure vital signs reviewed and stable Respiratory status: spontaneous breathing Cardiovascular status: stable Anesthetic complications: no   No notable events documented.   Last Vitals:  Vitals:   02/15/24 1033 02/15/24 1200  BP: (!) 213/73 (!) 187/75  Pulse: (!) 58 (!) 56  Resp: 18 16  Temp: 36.4 C (!) 36.2 C  SpO2: 99% (!) 87%    Last Pain:  Vitals:   02/15/24 1200  TempSrc:   PainSc: 0-No pain                 VAN STAVEREN,Nadiyah Zeis

## 2024-02-15 NOTE — Anesthesia Preprocedure Evaluation (Addendum)
 Anesthesia Evaluation  Patient identified by MRN, date of birth, ID band Patient awake    Reviewed: Allergy & Precautions, NPO status , Patient's Chart, lab work & pertinent test results  Airway Mallampati: II  TM Distance: <3 FB   Mouth opening: Limited Mouth Opening  Dental  (+) Missing, Dental Advisory Given   Pulmonary Patient abstained from smoking., former smoker   breath sounds clear to auscultation       Cardiovascular Exercise Tolerance: Poor hypertension, Pt. on medications + CAD and +CHF   Rhythm:Regular Rate:Normal     Neuro/Psych   Anxiety     CVA    GI/Hepatic Neg liver ROS,GERD  Medicated,,  Endo/Other  negative endocrine ROS    Renal/GU   negative genitourinary   Musculoskeletal   Abdominal  (+) + obese  Peds negative pediatric ROS (+)  Hematology  (+) Blood dyscrasia, anemia   Anesthesia Other Findings   Reproductive/Obstetrics                              Anesthesia Physical Anesthesia Plan  ASA: 3  Anesthesia Plan:    Post-op Pain Management:    Induction:   PONV Risk Score and Plan:   Airway Management Planned: Natural Airway and Nasal Cannula  Additional Equipment:   Intra-op Plan:   Post-operative Plan:   Informed Consent: I have reviewed the patients History and Physical, chart, labs and discussed the procedure including the risks, benefits and alternatives for the proposed anesthesia with the patient or authorized representative who has indicated his/her understanding and acceptance.       Plan Discussed with: CRNA  Anesthesia Plan Comments:         Anesthesia Quick Evaluation

## 2024-02-15 NOTE — Op Note (Addendum)
 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   Post operative diagnosis: Same  Anesthesia: MAC and Local   Surgeon: Dr. Rodolph   Indication: This 89 y.o. year old female with malignant mass of the left arm with bleeding.    Description of procedure: after orienting patient about the procedure steps and benefits and patient agreed to proceed. Time out was done identifying correct patient and location of procedure. After induction of monitored sedation, local anesthesia was infiltrated around the palpable lesion. With a blade #15, an elliptical incision was made using the skin lines of the left arm. Sharp dissection was carried down and lesion was excised including dermal tissue. The mass measured 6 cm in vivo. Deep dermal stitches were done with vicryl 4-0 to repair the laceration and skin closed with Monocryl 4-0 in subcuticular fashion. Specimen sent to pathology.    Complications: none   EBL: minimal  Lucas Rodolph, MD, FACS

## 2024-02-15 NOTE — Discharge Instructions (Addendum)
  Diet: Resume home heart healthy regular diet.   Activity: Increase activity as tolerated. Light activity and walking are encouraged. Do not drive or drink alcohol if taking narcotic pain medications.  Wound care: Remove dressing in 24-48 hours. Once dressing removed, may shower with soapy water and pat dry (do not rub incisions), but no baths or submerging incision underwater until follow-up. (no swimming)   Medications: Resume all home medications. For mild to moderate pain: acetaminophen  (Tylenol ) or ibuprofen (if no kidney disease). Combining Tylenol  with alcohol can substantially increase your risk of causing liver disease. Narcotic pain medications, if prescribed, can be used for severe pain, though may cause nausea, constipation, and drowsiness. Do not combine Tylenol  and Norco within a 6 hour period as Norco contains Tylenol . If you do not need the narcotic pain medication, you do not need to fill the prescription.  Call office [phone]) at any time if any questions, worsening pain, fevers/chills, bleeding, drainage from incision site, or other concerns.

## 2024-02-15 NOTE — Interval H&P Note (Signed)
 History and Physical Interval Note:  02/15/2024 10:19 AM  Caroline Aguilar  has presented today for surgery, with the diagnosis of Malignant neoplasm of skin, upper extremity, left C44.609.  The various methods of treatment have been discussed with the patient and family. After consideration of risks, benefits and other options for treatment, the patient has consented to  Procedure(s): EXCISION MASS UPPER EXTREMITIES (Left) as a surgical intervention.  The patient's history has been reviewed, patient examined, no change in status, stable for surgery.  I have reviewed the patient's chart and labs.  Questions were answered to the patient's satisfaction.     Lucas Sjogren

## 2024-02-15 NOTE — Transfer of Care (Signed)
 Immediate Anesthesia Transfer of Care Note  Patient: Caroline Aguilar  Procedure(s) Performed: EXCISION MASS UPPER EXTREMITIES (Left: Elbow)  Patient Location: PACU  Anesthesia Type:General  Level of Consciousness: drowsy  Airway & Oxygen Therapy: Patient Spontanous Breathing  Post-op Assessment: Report given to RN and Post -op Vital signs reviewed and stable  Post vital signs: Reviewed and stable  Last Vitals:  Vitals Value Taken Time  BP 187/75 02/15/24 12:03  Temp    Pulse 54 02/15/24 12:05  Resp 16 02/15/24 12:05  SpO2 95 % 02/15/24 12:05  Vitals shown include unfiled device data.  Last Pain:  Vitals:   02/15/24 1033  TempSrc: Temporal  PainSc: 0-No pain         Complications: No notable events documented.

## 2024-02-16 ENCOUNTER — Encounter: Payer: Self-pay | Admitting: General Surgery

## 2024-02-16 NOTE — Progress Notes (Signed)
 Given weakness/fall covid and resp panel ordered

## 2024-02-18 LAB — SURGICAL PATHOLOGY

## 2024-02-21 DIAGNOSIS — I350 Nonrheumatic aortic (valve) stenosis: Secondary | ICD-10-CM | POA: Diagnosis not present

## 2024-02-21 DIAGNOSIS — I480 Paroxysmal atrial fibrillation: Secondary | ICD-10-CM | POA: Diagnosis not present

## 2024-02-21 DIAGNOSIS — Z953 Presence of xenogenic heart valve: Secondary | ICD-10-CM | POA: Diagnosis not present

## 2024-02-25 DIAGNOSIS — Z953 Presence of xenogenic heart valve: Secondary | ICD-10-CM | POA: Diagnosis not present

## 2024-02-25 DIAGNOSIS — I480 Paroxysmal atrial fibrillation: Secondary | ICD-10-CM | POA: Diagnosis not present

## 2024-02-25 DIAGNOSIS — I7121 Aneurysm of the ascending aorta, without rupture: Secondary | ICD-10-CM | POA: Diagnosis not present

## 2024-02-25 DIAGNOSIS — Z7901 Long term (current) use of anticoagulants: Secondary | ICD-10-CM | POA: Diagnosis not present

## 2024-02-25 DIAGNOSIS — Z8679 Personal history of other diseases of the circulatory system: Secondary | ICD-10-CM | POA: Diagnosis not present

## 2024-02-25 DIAGNOSIS — I350 Nonrheumatic aortic (valve) stenosis: Secondary | ICD-10-CM | POA: Diagnosis not present

## 2024-02-26 DIAGNOSIS — C44609 Unspecified malignant neoplasm of skin of left upper limb, including shoulder: Secondary | ICD-10-CM | POA: Diagnosis not present

## 2024-04-22 ENCOUNTER — Other Ambulatory Visit: Payer: Self-pay

## 2024-04-22 ENCOUNTER — Other Ambulatory Visit (HOSPITAL_COMMUNITY): Payer: Self-pay

## 2024-04-24 ENCOUNTER — Other Ambulatory Visit: Payer: Self-pay

## 2024-04-28 ENCOUNTER — Other Ambulatory Visit: Payer: Self-pay

## 2024-04-29 ENCOUNTER — Ambulatory Visit
Admission: RE | Admit: 2024-04-29 | Discharge: 2024-04-29 | Disposition: A | Discharge status: Home / Self Care | Source: Ambulatory Visit | Attending: Internal Medicine | Admitting: Internal Medicine

## 2024-04-29 ENCOUNTER — Other Ambulatory Visit: Payer: Self-pay | Admitting: Internal Medicine

## 2024-04-29 ENCOUNTER — Other Ambulatory Visit: Payer: Self-pay

## 2024-04-29 DIAGNOSIS — G9340 Encephalopathy, unspecified: Secondary | ICD-10-CM | POA: Insufficient documentation

## 2024-04-29 DIAGNOSIS — I639 Cerebral infarction, unspecified: Secondary | ICD-10-CM | POA: Diagnosis present

## 2024-04-29 NOTE — Progress Notes (Signed)
 "                                    Patient Profile:   Caroline Aguilar  is a 89 y.o.  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 Kathlynn   excision of mass Left 02/15/2024   Dr Lucas Catchings -- Lt arm   BLADDER TACK     COLONOSCOPY  05/19/2005 and 05/15/2006   HYSTERECTOMY     BSO   RIGHT BREAST BIOPSY     RIGHT TIBIA FRACTURE      ALLERGIES: Allergies  Allergen Reactions   Penicillins Anaphylaxis and Other (See Comments)   Cortisone Hallucination and Liver Disorder   Meloxicam  Unknown and Other (See Comments)    Increased risk of bleeding with apixaban , per son   Azithromycin  Unknown and Other (See Comments)   Effexor [Venlafaxine] Dizziness   Famciclovir Unknown and Other (See Comments)   Morphine  Other (See Comments)   Streptomycin Unknown and Other (See Comments)   Tramadol  Hallucination    CURRENT MEDICATIONS: Current Outpatient Medications  Medication Sig  Dispense Refill   acetaminophen  (TYLENOL ) 325 MG tablet Take 650 mg by mouth every 6 (six) hours as needed        buPROPion  (WELLBUTRIN  XL) 150 MG XL tablet Take 1 tablet (150 mg total) by mouth once daily 90 tablet 3   COMBIGAN  0.2-0.5 % ophthalmic solution Place 1 drop into both eyes 2 (two) times daily     docosahexaenoic acid/epa (FISH OIL ORAL) Take by mouth     docusate (COLACE) 100 MG capsule Take by mouth Every other day     ELIQUIS  2.5 mg tablet TAKE 1 TABLET(2.5 MG) BY MOUTH EVERY 12 HOURS 60 tablet 11   gabapentin  (NEURONTIN ) 100 MG capsule Take 1 capsule (100 mg total) by mouth at bedtime 90 capsule 3   latanoprost  (XALATAN ) 0.005 % ophthalmic solution Place 1 drop into both eyes once daily.     sertraline  (ZOLOFT ) 50 MG tablet Take 1 tablet (50 mg total) by mouth once daily 90 tablet 0   simvastatin  (ZOCOR ) 20 MG tablet Take 1 tablet (20 mg total) by mouth at bedtime 90 tablet 3   ciprofloxacin  HCl (CIPRO ) 250 MG tablet Take 1 tablet (250 mg total)  by mouth 2 (two) times daily for 7 days 14 tablet 0   hydrALAZINE  (APRESOLINE ) 25 MG tablet Take 1 tablet (25 mg total) by mouth 3 (three) times daily as needed Take if systolic blood pressure is > 180 180 tablet 3   Current Facility-Administered Medications  Medication Dose Route Frequency Provider Last Rate Last Admin   cefTRIAXone  (ROCEPHIN ) dry powder 0.5 g  0.5 g Intramuscular Once Cleotilde Oneil Novel, MD          HPI   CLINICAL SUMMARY:  Over the last 7-10 days patient has had a progressive altered mental status, hallucinations, confusion.  No clear abdominal pain, no urinary symptoms.  Leaning more to the left.  Not eating that well.  ROS: Review of systems is unremarkable for any active cardiac, respiratory, GI, GU, hematologic, neurologic, dermatologic, HEENT, or psychiatric symptoms except as noted above, 10 systems reviewed.  No fevers, chills, or constitutional symptoms.   PHYSICAL EXAM  Vital signs:   BP (!) 200/120   Pulse 66   Ht 157.5 cm (5' 2)   Wt 69.9 kg (154 lb)   SpO2 94%   BMI 28.17 kg/m  Body mass index is 28.17 kg/m.   Wt Readings from Last 3 Encounters:  04/29/24 69.9 kg (154 lb)  02/26/24 71.2 kg (157 lb)  02/25/24 71.2 kg (157 lb)     BP Readings from Last 3 Encounters:  04/29/24 (!) 200/120  02/26/24 108/58  02/25/24 (!) 146/74    Constitutional: Lethargic, leaning to the left Neck: supple, no thyromegaly, good ROM Respiratory:clear to auscultation, no rales or wheezes Cardiovascular:RRR, no murmur or gallop Abdominal:soft, good BS, NT Ext: no edema, good peripheral pulses Neuro: Somnolent, disoriented, questionable left-sided weakness     ASSESSMENT/PLAN   Altered mental status/generalized weakness-differential is urinary sepsis versus acute CVA.  Very concerned for CVA with blood pressure elevated.  Will discontinue midodrine .  Stat brain CT without contrast Empiric Rocephin  500 mg IM x 1 with Cipro  250 mg twice daily x 7 days, stat labs Elevated blood pressure-will not treat that at the moment until we find the cause of her altered mental status, likely the blood pressure is being reactive History of ASD-no murmur, post TAVR  Dispo:   Return in about 6 months (around 10/27/2024) for followup.     "
